# Patient Record
Sex: Female | Born: 1955
Health system: Southern US, Community
[De-identification: ages and names within clinical notes are randomized; demographics above are authoritative.]

## PROBLEM LIST (undated history)

## (undated) DIAGNOSIS — K219 Gastro-esophageal reflux disease without esophagitis: Secondary | ICD-10-CM

## (undated) DIAGNOSIS — F1010 Alcohol abuse, uncomplicated: Secondary | ICD-10-CM

## (undated) DIAGNOSIS — N289 Disorder of kidney and ureter, unspecified: Secondary | ICD-10-CM

## (undated) DIAGNOSIS — E119 Type 2 diabetes mellitus without complications: Secondary | ICD-10-CM

## (undated) DIAGNOSIS — F1096 Alcohol use, unspecified with alcohol-induced persisting amnestic disorder: Secondary | ICD-10-CM

## (undated) DIAGNOSIS — I10 Essential (primary) hypertension: Secondary | ICD-10-CM

## (undated) HISTORY — PX: FINGER SURGERY: SHX640

## (undated) HISTORY — PX: EYE SURGERY: SHX253

## (undated) HISTORY — PX: PILONIDAL CYST DRAINAGE: SHX743

## (undated) HISTORY — PX: DILATION AND CURETTAGE OF UTERUS: SHX78

## (undated) HISTORY — DX: Alcohol abuse, uncomplicated: F10.10

---

## 1987-04-22 HISTORY — PX: KNEE ARTHROSCOPY: SUR90

## 2011-02-12 ENCOUNTER — Ambulatory Visit: Payer: Self-pay | Admitting: Ophthalmology

## 2011-06-10 ENCOUNTER — Ambulatory Visit: Payer: Self-pay | Admitting: Ophthalmology

## 2011-07-01 ENCOUNTER — Ambulatory Visit: Payer: Self-pay | Admitting: Ophthalmology

## 2013-08-17 DIAGNOSIS — R748 Abnormal levels of other serum enzymes: Secondary | ICD-10-CM | POA: Insufficient documentation

## 2013-08-17 DIAGNOSIS — K760 Fatty (change of) liver, not elsewhere classified: Secondary | ICD-10-CM | POA: Insufficient documentation

## 2014-08-13 NOTE — Op Note (Signed)
PATIENT NAME:  Laurie Lin, Laurie Lin MR#:  [id number] DATE OF BIRTH:  June 30, 1955  DATE OF PROCEDURE:  06/10/2011  PREOPERATIVE DIAGNOSIS: Visually significant cataract of the right eye.   POSTOPERATIVE DIAGNOSIS: Visually significant cataract of the right eye.   OPERATIVE PROCEDURE: Cataract extraction by phacoemulsification with implant of intraocular lens to right eye.   SURGEON: Birder Robson, MD.   ANESTHESIA:  1. Managed anesthesia care.  2. Topical tetracaine drops followed by 2% Xylocaine jelly applied in the preoperative holding area.   COMPLICATIONS: None.   TECHNIQUE:  Stop and chop.   DESCRIPTION OF PROCEDURE: The patient was examined and consented in the preoperative holding area where the aforementioned topical anesthesia was applied to the right eye.  The patient was brought back to the Operating Room where he was sat upright on the gurney and given a target to fixate upon while the eye was marked at the 3:00 and 9:00 position.  The patient was then reclined on the operating table, and lidocaine jelly was applied to the right eye.  The eye was prepped and draped in the usual sterile ophthalmic fashion and a lid speculum was placed. A paracentesis was created with the side port blade and the anterior chamber was filled with viscoelastic. A near clear corneal incision was performed with the steel keratome. A continuous curvilinear capsulorrhexis was performed with a cystotome followed by the capsulorrhexis forceps. Hydrodissection and hydrodelineation were carried out with BSS on a blunt cannula. The lens was removed in a stop and chop technique and the remaining cortical material was removed with the irrigation-aspiration handpiece. The eye was inflated with viscoelastic and the Alcon [id number] 14.5-diopter lens, serial number [id number]  was placed in the eye and rotated to within a few degrees of the predetermined orientation at 155 degrees.  The remaining viscoelastic was removed from  the eye.  The Sinskey hook was used to rotate the toric lens into its final resting place at 96 degrees.  The eye was inflated to a physiologic pressure and found to be watertight.  The eye was dressed with Vigamox and Omnipred. The patient was given protective glasses to wear throughout the day and a shield with which to sleep tonight. The patient was also given drops with which to begin a drop regimen today and will follow-up with me in one day.  ____________________________ Livingston Diones. Milledge Gerding, MD wlp:slb D: 06/10/2011 12:21:54 ET T: 06/10/2011 12:58:01 ET JOB#: [id number]  cc: Birgit Nowling L. Antony Sian, MD, <Dictator> Livingston Diones Noeh Sparacino MD ELECTRONICALLY SIGNED 06/13/2011 15:32

## 2014-08-13 NOTE — Op Note (Signed)
PATIENT NAME:  Laurie Lin, Laurie Lin MR#:  [id number] DATE OF BIRTH:  07-22-1955  DATE OF PROCEDURE:  07/01/2011  PREOPERATIVE DIAGNOSIS: Visually significant cataract of the left eye.   POSTOPERATIVE DIAGNOSIS: Visually significant cataract of the left eye.   OPERATIVE PROCEDURE: Cataract extraction by phacoemulsification with implant of intraocular lens to left eye.   SURGEON: Birder Robson, MD.   ANESTHESIA:  1. Managed anesthesia care.  2. Topical tetracaine drops followed by 2% Xylocaine jelly applied in the preoperative holding area.   COMPLICATIONS: None.   TECHNIQUE:  Stop and chop.     DESCRIPTION OF PROCEDURE: The patient was examined and consented in the preoperative holding area where the aforementioned topical anesthesia was applied to the left eye.  The patient was brought back to the Operating Room where he was sat upright on the gurney and given a target to fixate upon while the eye was marked at the 3:00 and 9:00 position.  The patient was then reclined on the operating table, and lidocaine jelly was applied to the left eye.  The eye was prepped and draped in the usual sterile ophthalmic fashion and a lid speculum was placed. A paracentesis was created with the side port blade and the anterior chamber was filled with viscoelastic. A near clear corneal incision was performed with the steel keratome. A continuous curvilinear capsulorrhexis was performed with a cystotome followed by the capsulorrhexis forceps. Hydrodissection and hydrodelineation were carried out with BSS on a blunt cannula. The lens was removed in a stop and chop technique and the remaining cortical material was removed with the irrigation-aspiration handpiece. The eye was inflated with viscoelastic and the Alcon SN6AT4 15.5-diopter lens, serial number [id number]  was placed in the eye and rotated to within a few degrees of the predetermined orientation at 22 degrees.  The remaining viscoelastic was removed from the  eye.  The Sinskey hook was used to rotate the toric lens into its final resting place at 95 degrees.  The eye was inflated to a physiologic pressure and found to be watertight.  The eye was dressed with Vigamox. The patient was given protective glasses to wear throughout the day and a shield with which to sleep tonight. The patient was also given drops with which to begin a drop regimen today and will follow-up with me in one day.   ____________________________ Livingston Diones. Vara Mairena, MD wlp:cms D: 07/01/2011 13:12:23 ET T: 07/01/2011 13:33:15 ET JOB#: [id number]  cc: Leeba Barbe L. Mikaya Bunner, MD, <Dictator> Livingston Diones Nazareth Kirk MD ELECTRONICALLY SIGNED 07/02/2011 13:42

## 2014-09-28 DIAGNOSIS — I10 Essential (primary) hypertension: Secondary | ICD-10-CM | POA: Insufficient documentation

## 2015-01-03 DIAGNOSIS — F329 Major depressive disorder, single episode, unspecified: Secondary | ICD-10-CM | POA: Insufficient documentation

## 2017-03-21 ENCOUNTER — Emergency Department: Payer: BLUE CROSS/BLUE SHIELD

## 2017-03-21 ENCOUNTER — Inpatient Hospital Stay
Admission: EM | Admit: 2017-03-21 | Discharge: 2017-03-27 | DRG: 638 | Disposition: A | Discharge status: Skilled Nursing Facility | Payer: BLUE CROSS/BLUE SHIELD | Attending: Specialist | Admitting: Specialist

## 2017-03-21 ENCOUNTER — Other Ambulatory Visit: Payer: Self-pay

## 2017-03-21 DIAGNOSIS — N19 Unspecified kidney failure: Secondary | ICD-10-CM

## 2017-03-21 DIAGNOSIS — N184 Chronic kidney disease, stage 4 (severe): Secondary | ICD-10-CM | POA: Diagnosis present

## 2017-03-21 DIAGNOSIS — D631 Anemia in chronic kidney disease: Secondary | ICD-10-CM | POA: Diagnosis present

## 2017-03-21 DIAGNOSIS — E1022 Type 1 diabetes mellitus with diabetic chronic kidney disease: Secondary | ICD-10-CM | POA: Diagnosis present

## 2017-03-21 DIAGNOSIS — Z794 Long term (current) use of insulin: Secondary | ICD-10-CM | POA: Diagnosis not present

## 2017-03-21 DIAGNOSIS — E872 Acidosis, unspecified: Secondary | ICD-10-CM

## 2017-03-21 DIAGNOSIS — K219 Gastro-esophageal reflux disease without esophagitis: Secondary | ICD-10-CM | POA: Diagnosis present

## 2017-03-21 DIAGNOSIS — K760 Fatty (change of) liver, not elsewhere classified: Secondary | ICD-10-CM | POA: Diagnosis present

## 2017-03-21 DIAGNOSIS — I951 Orthostatic hypotension: Secondary | ICD-10-CM | POA: Diagnosis present

## 2017-03-21 DIAGNOSIS — G9349 Other encephalopathy: Secondary | ICD-10-CM | POA: Diagnosis present

## 2017-03-21 DIAGNOSIS — E1010 Type 1 diabetes mellitus with ketoacidosis without coma: Secondary | ICD-10-CM | POA: Diagnosis present

## 2017-03-21 DIAGNOSIS — Z79899 Other long term (current) drug therapy: Secondary | ICD-10-CM | POA: Diagnosis not present

## 2017-03-21 DIAGNOSIS — E1110 Type 2 diabetes mellitus with ketoacidosis without coma: Secondary | ICD-10-CM | POA: Diagnosis present

## 2017-03-21 DIAGNOSIS — E1040 Type 1 diabetes mellitus with diabetic neuropathy, unspecified: Secondary | ICD-10-CM | POA: Diagnosis present

## 2017-03-21 DIAGNOSIS — F329 Major depressive disorder, single episode, unspecified: Secondary | ICD-10-CM | POA: Diagnosis present

## 2017-03-21 DIAGNOSIS — E10649 Type 1 diabetes mellitus with hypoglycemia without coma: Secondary | ICD-10-CM | POA: Diagnosis present

## 2017-03-21 DIAGNOSIS — R4182 Altered mental status, unspecified: Secondary | ICD-10-CM | POA: Diagnosis not present

## 2017-03-21 DIAGNOSIS — I129 Hypertensive chronic kidney disease with stage 1 through stage 4 chronic kidney disease, or unspecified chronic kidney disease: Secondary | ICD-10-CM | POA: Diagnosis present

## 2017-03-21 DIAGNOSIS — E10319 Type 1 diabetes mellitus with unspecified diabetic retinopathy without macular edema: Secondary | ICD-10-CM | POA: Diagnosis present

## 2017-03-21 DIAGNOSIS — F10231 Alcohol dependence with withdrawal delirium: Secondary | ICD-10-CM | POA: Diagnosis present

## 2017-03-21 DIAGNOSIS — Z833 Family history of diabetes mellitus: Secondary | ICD-10-CM

## 2017-03-21 DIAGNOSIS — G9341 Metabolic encephalopathy: Secondary | ICD-10-CM | POA: Diagnosis not present

## 2017-03-21 DIAGNOSIS — E860 Dehydration: Secondary | ICD-10-CM | POA: Diagnosis present

## 2017-03-21 DIAGNOSIS — R680 Hypothermia, not associated with low environmental temperature: Secondary | ICD-10-CM | POA: Diagnosis present

## 2017-03-21 DIAGNOSIS — E1021 Type 1 diabetes mellitus with diabetic nephropathy: Secondary | ICD-10-CM | POA: Diagnosis present

## 2017-03-21 DIAGNOSIS — T383X6A Underdosing of insulin and oral hypoglycemic [antidiabetic] drugs, initial encounter: Secondary | ICD-10-CM | POA: Diagnosis present

## 2017-03-21 DIAGNOSIS — T68XXXA Hypothermia, initial encounter: Secondary | ICD-10-CM

## 2017-03-21 DIAGNOSIS — K7460 Unspecified cirrhosis of liver: Secondary | ICD-10-CM

## 2017-03-21 DIAGNOSIS — N179 Acute kidney failure, unspecified: Secondary | ICD-10-CM | POA: Diagnosis present

## 2017-03-21 HISTORY — DX: Disorder of kidney and ureter, unspecified: N28.9

## 2017-03-21 HISTORY — DX: Type 2 diabetes mellitus without complications: E11.9

## 2017-03-21 HISTORY — DX: Gastro-esophageal reflux disease without esophagitis: K21.9

## 2017-03-21 HISTORY — DX: Essential (primary) hypertension: I10

## 2017-03-21 LAB — URINALYSIS, ROUTINE W REFLEX MICROSCOPIC
Bilirubin Urine: NEGATIVE
Glucose, UA: >=500 mg/dL — AB
KETONES UR: 20 mg/dL — AB
NITRITE: NEGATIVE
PH: 5 (ref 5.0–8.0)
PROTEIN: 100 mg/dL — AB
Specific Gravity, Urine: 1.014 (ref 1.005–1.030)

## 2017-03-21 LAB — GLUCOSE, CAPILLARY
GLUCOSE-CAPILLARY: 555 mg/dL — AB (ref 65–99)
GLUCOSE-CAPILLARY: 475 mg/dL — AB (ref 65–99)
GLUCOSE-CAPILLARY: 294 mg/dL — AB (ref 65–99)
Glucose-Capillary: 190 mg/dL — ABNORMAL HIGH (ref 65–99)
Glucose-Capillary: 226 mg/dL — ABNORMAL HIGH (ref 65–99)
Glucose-Capillary: 254 mg/dL — ABNORMAL HIGH (ref 65–99)
Glucose-Capillary: 336 mg/dL — ABNORMAL HIGH (ref 65–99)
Glucose-Capillary: 379 mg/dL — ABNORMAL HIGH (ref 65–99)
Glucose-Capillary: 402 mg/dL — ABNORMAL HIGH (ref 65–99)
Glucose-Capillary: 556 mg/dL (ref 65–99)
Glucose-Capillary: 576 mg/dL (ref 65–99)

## 2017-03-21 LAB — COMPREHENSIVE METABOLIC PANEL
ALBUMIN: 3.1 g/dL — AB (ref 3.5–5.0)
ALK PHOS: 55 U/L (ref 38–126)
ALT: 8 U/L — AB (ref 14–54)
AST: 13 U/L — ABNORMAL LOW (ref 15–41)
BILIRUBIN TOTAL: 2.2 mg/dL — AB (ref 0.3–1.2)
BUN: 27 mg/dL — AB (ref 6–20)
CALCIUM: 9.1 mg/dL (ref 8.9–10.3)
CREATININE: 2.58 mg/dL — AB (ref 0.44–1.00)
Chloride: 93 mmol/L — ABNORMAL LOW (ref 101–111)
GFR calc Af Amer: 22 mL/min — ABNORMAL LOW (ref >60)
GFR calc non Af Amer: 19 mL/min — ABNORMAL LOW (ref >60)
GLUCOSE: 620 mg/dL — AB (ref 65–99)
Potassium: 5.3 mmol/L — ABNORMAL HIGH (ref 3.5–5.1)
SODIUM: 132 mmol/L — AB (ref 135–145)
TOTAL PROTEIN: 7.4 g/dL (ref 6.5–8.1)

## 2017-03-21 LAB — PROCALCITONIN: PROCALCITONIN: 0.19 ng/mL

## 2017-03-21 LAB — BASIC METABOLIC PANEL
Anion gap: 25 — ABNORMAL HIGH (ref 5–15)
BUN: 29 mg/dL — AB (ref 6–20)
BUN: 30 mg/dL — AB (ref 6–20)
CALCIUM: 8.5 mg/dL — AB (ref 8.9–10.3)
CHLORIDE: 95 mmol/L — AB (ref 101–111)
CHLORIDE: 101 mmol/L (ref 101–111)
CO2: <7 mmol/L — ABNORMAL LOW (ref 22–32)
CO2: 12 mmol/L — ABNORMAL LOW (ref 22–32)
CREATININE: 2.71 mg/dL — AB (ref 0.44–1.00)
CREATININE: 2.83 mg/dL — AB (ref 0.44–1.00)
Calcium: 8.2 mg/dL — ABNORMAL LOW (ref 8.9–10.3)
GFR, EST AFRICAN AMERICAN: 21 mL/min — AB (ref >60)
GFR, EST AFRICAN AMERICAN: 19 mL/min — AB (ref >60)
GFR, EST NON AFRICAN AMERICAN: 18 mL/min — AB (ref >60)
GFR, EST NON AFRICAN AMERICAN: 17 mL/min — AB (ref >60)
Glucose, Bld: 570 mg/dL (ref 65–99)
Glucose, Bld: 333 mg/dL — ABNORMAL HIGH (ref 65–99)
Potassium: 4.1 mmol/L (ref 3.5–5.1)
Potassium: 2.9 mmol/L — ABNORMAL LOW (ref 3.5–5.1)
SODIUM: 132 mmol/L — AB (ref 135–145)
SODIUM: 138 mmol/L (ref 135–145)

## 2017-03-21 LAB — CBC WITH DIFFERENTIAL/PLATELET
BASOS ABS: 0.1 10*3/uL (ref 0–0.1)
BASOS PCT: 1 %
EOS ABS: 0 10*3/uL (ref 0–0.7)
EOS PCT: 0 %
HEMATOCRIT: 33.3 % — AB (ref 35.0–47.0)
Hemoglobin: 10.1 g/dL — ABNORMAL LOW (ref 12.0–16.0)
Lymphocytes Relative: 5 %
Lymphs Abs: 0.5 10*3/uL — ABNORMAL LOW (ref 1.0–3.6)
MCH: 33 pg (ref 26.0–34.0)
MCHC: 30.3 g/dL — AB (ref 32.0–36.0)
MCV: 109.1 fL — ABNORMAL HIGH (ref 80.0–100.0)
MONO ABS: 0.9 10*3/uL (ref 0.2–0.9)
MONOS PCT: 8 %
NEUTROS ABS: 10 10*3/uL — AB (ref 1.4–6.5)
Neutrophils Relative %: 86 %
PLATELETS: 284 10*3/uL (ref 150–440)
RBC: 3.05 MIL/uL — ABNORMAL LOW (ref 3.80–5.20)
RDW: 15.4 % — AB (ref 11.5–14.5)
WBC: 11.5 10*3/uL — ABNORMAL HIGH (ref 3.6–11.0)

## 2017-03-21 LAB — BLOOD GAS, VENOUS
Acid-base deficit: 27.3 mmol/L — ABNORMAL HIGH (ref 0.0–2.0)
Acid-base deficit: 9.3 mmol/L — ABNORMAL HIGH (ref 0.0–2.0)
BICARBONATE: 3.8 mmol/L — AB (ref 20.0–28.0)
Bicarbonate: 13.9 mmol/L — ABNORMAL LOW (ref 20.0–28.0)
FIO2: 0.21
O2 SAT: 63.1 %
O2 Saturation: 78 %
PATIENT TEMPERATURE: 37
PCO2 VEN: 19 mmHg — AB (ref 44.0–60.0)
PH VEN: 7.41 (ref 7.250–7.430)
PO2 VEN: 57 mmHg — AB (ref 32.0–45.0)
Patient temperature: 37
pCO2, Ven: 22 mmHg — ABNORMAL LOW (ref 44.0–60.0)
pH, Ven: 6.91 — CL (ref 7.250–7.430)
pO2, Ven: 42 mmHg (ref 32.0–45.0)

## 2017-03-21 LAB — CBC
HCT: 28.4 % — ABNORMAL LOW (ref 35.0–47.0)
Hemoglobin: 9 g/dL — ABNORMAL LOW (ref 12.0–16.0)
MCH: 33 pg (ref 26.0–34.0)
MCHC: 31.6 g/dL — ABNORMAL LOW (ref 32.0–36.0)
MCV: 104.2 fL — AB (ref 80.0–100.0)
PLATELETS: 240 10*3/uL (ref 150–440)
RBC: 2.72 MIL/uL — AB (ref 3.80–5.20)
RDW: 15.3 % — AB (ref 11.5–14.5)
WBC: 8.4 10*3/uL (ref 3.6–11.0)

## 2017-03-21 LAB — AMMONIA: AMMONIA: 28 umol/L (ref 9–35)

## 2017-03-21 LAB — TROPONIN I: Troponin I: <0.03 ng/mL (ref <0.03)

## 2017-03-21 LAB — MRSA PCR SCREENING: MRSA BY PCR: NEGATIVE

## 2017-03-21 LAB — LACTIC ACID, PLASMA
LACTIC ACID, VENOUS: 2.3 mmol/L — AB (ref 0.5–1.9)
LACTIC ACID, VENOUS: 1.6 mmol/L (ref 0.5–1.9)

## 2017-03-21 LAB — ETHANOL: Alcohol, Ethyl (B): <10 mg/dL (ref <10)

## 2017-03-21 LAB — MAGNESIUM: Magnesium: 1.1 mg/dL — ABNORMAL LOW (ref 1.7–2.4)

## 2017-03-21 LAB — PHOSPHORUS: Phosphorus: <1 mg/dL — CL (ref 2.5–4.6)

## 2017-03-21 LAB — BETA-HYDROXYBUTYRIC ACID

## 2017-03-21 MED ORDER — PANTOPRAZOLE SODIUM 40 MG IV SOLR
40.0000 mg | Freq: Two times a day (BID) | INTRAVENOUS | Status: DC
  Administered 2017-03-21 – 2017-03-24 (×8): 40 mg via INTRAVENOUS
  Filled 2017-03-21 (×8): qty 40

## 2017-03-21 MED ORDER — VANCOMYCIN HCL IN DEXTROSE 1-5 GM/200ML-% IV SOLN
1000.0000 mg | Freq: Once | INTRAVENOUS | Status: AC
  Administered 2017-03-21: 1000 mg via INTRAVENOUS
  Filled 2017-03-21: qty 200

## 2017-03-21 MED ORDER — POTASSIUM CHLORIDE 20 MEQ PO PACK
40.0000 meq | PACK | Freq: Once | ORAL | Status: AC
  Administered 2017-03-21: 40 meq via ORAL
  Filled 2017-03-21: qty 2

## 2017-03-21 MED ORDER — POTASSIUM CHLORIDE 20 MEQ PO PACK: 60.0000 meq | PACK | Freq: Once | ORAL | Status: DC

## 2017-03-21 MED ORDER — SODIUM CHLORIDE 0.9 % IV SOLN
INTRAVENOUS | Status: DC
  Administered 2017-03-21 – 2017-03-22 (×2): via INTRAVENOUS
  Administered 2017-03-22: 125 mL/h via INTRAVENOUS
  Administered 2017-03-23 – 2017-03-25 (×4): via INTRAVENOUS

## 2017-03-21 MED ORDER — POTASSIUM PHOSPHATES 15 MMOLE/5ML IV SOLN: 40.0000 mmol | Freq: Four times a day (QID) | INTRAVENOUS | Status: DC

## 2017-03-21 MED ORDER — ONDANSETRON HCL 4 MG/2ML IJ SOLN
4.0000 mg | Freq: Four times a day (QID) | INTRAMUSCULAR | Status: DC | PRN
  Administered 2017-03-21 – 2017-03-24 (×3): 4 mg via INTRAVENOUS
  Filled 2017-03-21 (×3): qty 2

## 2017-03-21 MED ORDER — VANCOMYCIN HCL 500 MG IV SOLR
500.0000 mg | Freq: Once | INTRAVENOUS | Status: DC
  Filled 2017-03-21: qty 500

## 2017-03-21 MED ORDER — SODIUM BICARBONATE 8.4 % IV SOLN
100.0000 meq | Freq: Once | INTRAVENOUS | Status: AC
  Administered 2017-03-21: 100 meq via INTRAVENOUS
  Filled 2017-03-21: qty 50

## 2017-03-21 MED ORDER — MAGNESIUM SULFATE 4 GM/100ML IV SOLN
4.0000 g | Freq: Once | INTRAVENOUS | Status: AC
  Administered 2017-03-21: 4 g via INTRAVENOUS
  Filled 2017-03-21: qty 100

## 2017-03-21 MED ORDER — LORAZEPAM 1 MG PO TABS
1.0000 mg | ORAL_TABLET | Freq: Four times a day (QID) | ORAL | Status: AC | PRN
Start: 2017-03-21 — End: 2017-03-24
  Administered 2017-03-24: 09:00:00 1 mg via ORAL
  Filled 2017-03-21: qty 1

## 2017-03-21 MED ORDER — PIPERACILLIN-TAZOBACTAM 3.375 G IVPB: 3.3750 g | Freq: Three times a day (TID) | INTRAVENOUS | Status: DC

## 2017-03-21 MED ORDER — DEXTROSE-NACL 5-0.45 % IV SOLN
INTRAVENOUS | Status: DC
  Administered 2017-03-21: 100 mL/h via INTRAVENOUS

## 2017-03-21 MED ORDER — VITAMIN B-1 100 MG PO TABS
100.0000 mg | ORAL_TABLET | Freq: Every day | ORAL | Status: DC
  Administered 2017-03-21 – 2017-03-27 (×6): 100 mg via ORAL
  Filled 2017-03-21 (×7): qty 1

## 2017-03-21 MED ORDER — ADULT MULTIVITAMIN W/MINERALS CH
1.0000 | ORAL_TABLET | Freq: Every day | ORAL | Status: DC
  Administered 2017-03-21 – 2017-03-27 (×6): 1 via ORAL
  Filled 2017-03-21 (×7): qty 1

## 2017-03-21 MED ORDER — POTASSIUM PHOSPHATES 15 MMOLE/5ML IV SOLN
40.0000 mmol | Freq: Four times a day (QID) | INTRAVENOUS | Status: AC
  Administered 2017-03-21 – 2017-03-22 (×2): 40 mmol via INTRAVENOUS
  Filled 2017-03-21 (×2): qty 13.33

## 2017-03-21 MED ORDER — POTASSIUM CHLORIDE 10 MEQ/100ML IV SOLN
10.0000 meq | INTRAVENOUS | Status: AC
  Administered 2017-03-21 (×3): 10 meq via INTRAVENOUS
  Filled 2017-03-21 (×3): qty 100

## 2017-03-21 MED ORDER — VANCOMYCIN HCL IN DEXTROSE 1-5 GM/200ML-% IV SOLN: 1000.0000 mg | INTRAVENOUS | Status: DC

## 2017-03-21 MED ORDER — STERILE WATER FOR INJECTION IV SOLN
Freq: Once | INTRAVENOUS | Status: AC
  Administered 2017-03-21: 13:00:00 via INTRAVENOUS
  Filled 2017-03-21: qty 850

## 2017-03-21 MED ORDER — SODIUM CHLORIDE 0.9 % IV BOLUS (SEPSIS)
1000.0000 mL | Freq: Once | INTRAVENOUS | Status: AC
  Administered 2017-03-21: 1000 mL via INTRAVENOUS

## 2017-03-21 MED ORDER — THIAMINE HCL 100 MG/ML IJ SOLN
100.0000 mg | Freq: Every day | INTRAMUSCULAR | Status: DC
  Filled 2017-03-21 (×2): qty 1
  Filled 2017-03-21: qty 2
  Filled 2017-03-21 (×2): qty 1

## 2017-03-21 MED ORDER — LORAZEPAM 2 MG/ML IJ SOLN
1.0000 mg | Freq: Four times a day (QID) | INTRAMUSCULAR | Status: AC | PRN
  Administered 2017-03-22 (×2): 1 mg via INTRAVENOUS
  Filled 2017-03-21 (×2): qty 1

## 2017-03-21 MED ORDER — SODIUM CHLORIDE 0.9 % IV SOLN
INTRAVENOUS | Status: DC
  Administered 2017-03-21: 16.6 [IU]/h via INTRAVENOUS
  Administered 2017-03-22: 13.2 [IU]/h via INTRAVENOUS
  Filled 2017-03-21 (×5): qty 1

## 2017-03-21 MED ORDER — FOLIC ACID 1 MG PO TABS
1.0000 mg | ORAL_TABLET | Freq: Every day | ORAL | Status: DC
  Administered 2017-03-21 – 2017-03-27 (×6): 1 mg via ORAL
  Filled 2017-03-21 (×7): qty 1

## 2017-03-21 MED ORDER — PIPERACILLIN-TAZOBACTAM 3.375 G IVPB 30 MIN
3.3750 g | Freq: Once | INTRAVENOUS | Status: AC
  Administered 2017-03-21: 3.375 g via INTRAVENOUS
  Filled 2017-03-21: qty 50

## 2017-03-21 MED ORDER — SODIUM CHLORIDE 0.9 % IV SOLN
INTRAVENOUS | Status: DC
  Administered 2017-03-21: 5.2 [IU]/h via INTRAVENOUS
  Filled 2017-03-21: qty 1

## 2017-03-21 MED ORDER — PANTOPRAZOLE SODIUM 40 MG PO TBEC: 40.0000 mg | DELAYED_RELEASE_TABLET | Freq: Every day | ORAL | Status: DC

## 2017-03-21 MED ORDER — SODIUM CHLORIDE 0.9 % IV SOLN: INTRAVENOUS | Status: AC

## 2017-03-21 MED ORDER — ENOXAPARIN SODIUM 30 MG/0.3ML ~~LOC~~ SOLN
30.0000 mg | SUBCUTANEOUS | Status: DC
  Administered 2017-03-21: 30 mg via SUBCUTANEOUS
  Filled 2017-03-21: qty 0.3

## 2017-03-21 NOTE — ED Notes (Signed)
Gave reprot to [facility] RN

## 2017-03-21 NOTE — ED Provider Notes (Signed)
[facility] Emergency Department Provider Note   ____________________________________________   None    (approximate)  I have reviewed the triage vital signs and the nursing notes.   HISTORY  Chief Complaint Confusion high blood sugars  EM caveat: The patient is confused, unable to provide notable history as she does not recall   HPI Laurie Lin is a 61 y.o. female here for evaluation of confusion  EMS reports that the patient went to bed last evening, this morning family tried to wake her up and found that she seemed to be confused.  They are concerned because she does not use her insulin or check her blood sugars at home, evidently also she has a history of alcohol abuse recently.  EMS noted the patient felt cold, slightly hypotensive and blood sugar elevated to greater than 400.  500 mL normal saline bolus initiated prior to arrival.  I do report the patient is able to recall the year but does not seem well oriented and cannot recall events.  I do reported that she was in a warm room, not in a cold environment.  Also reports that she has had complaints of nausea and vomiting  The patient has no complaints at this time, but she does appear confused.  She is able to recall that she has been throwing up.  She does report that her mouth feels very dry and she is very thirsty     Past Medical History:  Diagnosis Date  . Diabetes mellitus without complication ([place])   . Hypertension   . Renal disorder    stage 3     There are no active problems to display for this patient.   Past Surgical History:  Procedure Laterality Date  . EYE SURGERY     bilateral caterac    Prior to Admission medications   Medication Sig Start Date End Date Taking? Authorizing Provider  esomeprazole (NEXIUM) 40 MG capsule Take 40 mg by mouth 2 (two) times daily. 12/25/16  Yes [provider]  furosemide (LASIX) 20 MG tablet Take 20 mg by mouth daily. 02/10/17   Yes [provider]  metoprolol tartrate (LOPRESSOR) 25 MG tablet Take 25 mg by mouth 2 (two) times daily. 12/30/16  Yes [provider]    Allergies Patient has no known allergies.  History reviewed. No pertinent family history.  Social History Social History   Tobacco Use  . Smoking status: Never Smoker  . Smokeless tobacco: Never Used  Substance Use Topics  . Alcohol use: Yes    Comment: heavy drinker  . Drug use: No    Review of Systems EM caveat   ____________________________________________   PHYSICAL EXAM:  VITAL SIGNS: ED Triage Vitals  Enc Vitals Group     BP      Pulse      Resp      Temp      Temp src      SpO2      Weight      Height      Head Circumference      Peak Flow      Pain Score      Pain Loc      Pain Edu?      Excl. in [place]?     Constitutional: Alert and oriented to self but not situation.  She is sitting up pleasantly and in no obvious distress. Eyes: Conjunctivae are normal. Head: Atraumatic. Nose: No congestion/rhinnorhea. Mouth/Throat: Mucous membranes  are extremely dry Neck: No stridor.   Cardiovascular: Normal rate, regular rhythm. Grossly normal heart sounds.  Good peripheral circulation. Respiratory: Normal respiratory effort.  No retractions. Lungs CTAB. Gastrointestinal: Soft and nontender. No distention. Musculoskeletal: No lower extremity tenderness nor edema. Neurologic:  Normal speech and language. No gross focal neurologic deficits are appreciated.  Skin:  Skin is cool, dry and intact. No rash noted. Psychiatric: Mood and affect are flat.  Seems slightly confused.  Cannot seem to recall events on why she presented today.  Does recall that she is diabetic but cannot tell me if she is using her insulin though EMS reported she was not. ____________________________________________   LABS (all labs ordered are listed, but only abnormal results are displayed)  Labs Reviewed  COMPREHENSIVE METABOLIC  PANEL - Abnormal; Notable for the following components:      Result Value   Sodium 132 (*)    Potassium 5.3 (*)    Chloride 93 (*)    CO2 <7 (*)    Glucose, Bld 620 (*)    BUN 27 (*)    Creatinine, Ser 2.58 (*)    Albumin 3.1 (*)    AST 13 (*)    ALT 8 (*)    Total Bilirubin 2.2 (*)    GFR calc non Af Amer 19 (*)    GFR calc Af Amer 22 (*)    All other components within normal limits  CBC WITH DIFFERENTIAL/PLATELET - Abnormal; Notable for the following components:   WBC 11.5 (*)    RBC 3.05 (*)    Hemoglobin 10.1 (*)    HCT 33.3 (*)    MCV 109.1 (*)    MCHC 30.3 (*)    RDW 15.4 (*)    Neutro Abs 10.0 (*)    Lymphs Abs 0.5 (*)    All other components within normal limits  URINALYSIS, ROUTINE W REFLEX MICROSCOPIC - Abnormal; Notable for the following components:   Color, Urine YELLOW (*)    APPearance HAZY (*)    Glucose, UA >=500 (*)    Hgb urine dipstick MODERATE (*)    Ketones, ur 20 (*)    Protein, ur 100 (*)    Leukocytes, UA SMALL (*)    Bacteria, UA RARE (*)    Squamous Epithelial / LPF 0-5 (*)    All other components within normal limits  BLOOD GAS, VENOUS - Abnormal; Notable for the following components:   pH, Ven 6.91 (*)    pCO2, Ven 19 (*)    pO2, Ven 57.0 (*)    Bicarbonate 3.8 (*)    Acid-base deficit 27.3 (*)    All other components within normal limits  LACTIC ACID, PLASMA - Abnormal; Notable for the following components:   Lactic Acid, Venous 2.3 (*)    All other components within normal limits  CULTURE, BLOOD (ROUTINE X 2)  CULTURE, BLOOD (ROUTINE X 2)  TROPONIN I  LACTIC ACID, PLASMA  BETA-HYDROXYBUTYRIC ACID  AMMONIA  ETHANOL   ____________________________________________  EKG  Reviewed interrupt me at 11 AM Heart rate 99 QRS 90 QTC 460 Normal sinus rhythm, occasional PVC, nonspecific T wave abnormality possibly artifactual seen in multiple leads.  No clear evidence of  ischemia ____________________________________________  RADIOLOGY  Dg Chest Port 1 View  Result Date: 03/21/2017 CLINICAL DATA:  Confusion, known diabetic, ? Sepsis Pt unresponsive to questions EXAM: PORTABLE CHEST 1 VIEW COMPARISON:  None. FINDINGS: Normal mediastinum and cardiac silhouette. Normal pulmonary vasculature. No evidence of effusion, infiltrate,  or pneumothorax. No acute bony abnormality. IMPRESSION: Normal chest radiograph Electronically Signed   By: Suzy Bouchard M.D.   On: 03/21/2017 11:01    Chest x-ray reviewed, no acute ____________________________________________   PROCEDURES  Procedure(s) performed: None  Procedures  Critical Care performed: Yes, see critical care note(s)  CRITICAL CARE Performed by: Delman Kitten   Total critical care time: 65 minutes  Critical care time was exclusive of separately billable procedures and treating other patients.  Critical care was necessary to treat or prevent imminent or life-threatening deterioration.  Critical care was time spent personally by me on the following activities: development of treatment plan with patient and/or surrogate as well as nursing, discussions with consultants, evaluation of patient's response to treatment, examination of patient, obtaining history from patient or surrogate, ordering and performing treatments and interventions, ordering and review of laboratory studies, ordering and review of radiographic studies, pulse oximetry and re-evaluation of patient's condition.  ____________________________________________   INITIAL IMPRESSION / ASSESSMENT AND PLAN / ED COURSE  Pertinent labs & imaging results that were available during my care of the patient were reviewed by me and considered in my medical decision making (see chart for details).    Clinical Course as of Mar 21 1220  Sat Mar 21, 2017  1141 Discussed with patient's husband is now bedside.  Relayed her critical status.  She is now  sitting upright and her blood pressures improved to over 564 systolic.  She does appear improved.  Husband relates that she has been very thirsty for about 3-4 days drinking lots of water, began developing nausea and confusion yesterday but was able to go shopping and get a Christmas tree.  This morning her status continued to worsen.  At the present time I suspect likely this represents significant DKA, but cannot exclude infection especially given her associated hypothermia and elevated lactate which could be elevated from her DKA as well.  Hydrating generously, awaiting chemistry.  She is on the bear hugger.  Will initiate broad-spectrum antibiotic at this time as I am unclear if this could also potentially represent sepsis/severe sepsis.  [MQ]  3329 Still awaiting met panel. Continuing fluid boluses.   [MQ]    Clinical Course User Index [MQ] Delman Kitten, MD    ----------------------------------------- 12:19 PM on 03/21/2017 -----------------------------------------  Presentation appears consistent with DKA.  Patient is now resulted with labs with potassium that is greater than 5 and acute kidney injury.  I will start her on bicarbonate infusion as well as ED glucose stabilizer/insulin at this time now that she has received 2 L of fluid.  Her blood pressure is improved, she is rewarming, she is alert and oriented to self but still remains slightly confused but overall appears much improved.  At this time, appears consistent with DKA with some alteration in mental status associated.  I have discussed with the hospitalist as well as family.  We will admit her for ongoing care.  Patient critical. ____________________________________________   FINAL CLINICAL IMPRESSION(S) / ED DIAGNOSES  Final diagnoses:  Type 1 diabetes mellitus with ketoacidosis without coma (HCC)  Hypothermia, initial encounter  AKI (acute kidney injury) ([place])  Metabolic acidosis      NEW MEDICATIONS STARTED DURING THIS  VISIT:  This SmartLink is deprecated. Use AVSMEDLIST instead to display the medication list for a patient.   Note:  This document was prepared using Dragon voice recognition software and may include unintentional dictation errors.     Delman Kitten, MD 03/21/17 [phone]

## 2017-03-21 NOTE — Consult Note (Addendum)
[facility] Consultation      Name: Laurie Lin MRN: [id number] DOB: 05-09-1955    ADMISSION DATE:  03/21/2017 CONSULTATION DATE:  03/21/17  REFERRING MD :  Dr. Posey Pronto   CHIEF COMPLAINT:   Confusion   HISTORY OF PRESENT ILLNESS   61 years old lady with a past medical history significant for hypertension, diabetes, CKD stage III, GERD ongoing alcohol abuse who was brought to the hospital by her husband with a history of confusion for the past 1 day as well as polyuria since last night.. The patient is a known diabetic and she stopped taking her insulin 1 week ago and has not been checking her blood sugars at home. The patient was found to be in DKA with severe metabolic acidosis and a pH of 6.9. She was also hypothermic and hypotensive at the time of presentation.  She was given 2 L of saline bolus and was started on an insulin drip.  Her blood pressure responded to IV fluids.  Started on a bicarbonate drip.  She was also given a dose of vancomycin and Zosyn in the emergency room.  Cultures were drawn as well.  Lactic acid levels were elevated initially but normalized subsequently.  The patient was placed on a Retail banker.  She was transferred to the ICU On arrival to the ICU the patient has been hemodynamically stable.  She is awake, following commands but has mild confusion.  She states that she stopped taking her insulin because she was having some family issues. She denies chest pain, chest pressure, nausea, vomiting. She says that she vomited yesterday but has not vomited since. She states she has not been eating or drinking well for the last 1 week secondary to nausea.    PAST MEDICAL HISTORY    :  Past Medical History:  Diagnosis Date  . Diabetes mellitus without complication ([place])   . GERD (gastroesophageal reflux disease)   . Hypertension   . Renal disorder    stage 3    Past Surgical History:  Procedure Laterality Date  . EYE SURGERY     bilateral caterac   Prior to Admission medications   Medication Sig Start Date End Date Taking? Authorizing Provider  esomeprazole (NEXIUM) 40 MG capsule Take 40 mg by mouth 2 (two) times daily. 12/25/16  Yes [provider]  furosemide (LASIX) 20 MG tablet Take 20 mg by mouth daily. 02/10/17  Yes [provider]  metoprolol tartrate (LOPRESSOR) 25 MG tablet Take 25 mg by mouth 2 (two) times daily. 12/30/16  Yes [provider]   No Known Allergies   FAMILY HISTORY   Family History  Problem Relation Age of Onset  . Diabetes Mother       SOCIAL HISTORY    reports that  has never smoked. she has never used smokeless tobacco. She reports that she drinks alcohol. She reports that she does not use drugs.  ROS Negative except for given in HPI   [place]:  [92.9 F (33.8 C)-95 F (35 C)] 95 F (35 C) (12/01 1337) Pulse Rate:  [85-98] 96 (12/01 1337) Resp:  [18-22] 18 (12/01 1337) BP: (87-111)/(56-68) 102/68 (12/01 1337) SpO2:  [100 %] 100 % (12/01 1337) Weight:  [171 lb 8.3 oz (77.8 kg)-175 lb (79.4 kg)] 171 lb 8.3 oz (77.8 kg) (12/01 1337) HEMODYNAMICS:   VENTILATOR SETTINGS:   INTAKE / OUTPUT: No intake or output data in the 24 hours ending 03/21/17  [place]   Awake, mildly confused.  Answering questions and following commands. Sclera anicteric. CVS S1, S2, 0. Chest is clear to auscultation bilaterally with no rales rhonchi or wheezes. Abdomen is soft, nontender, nondistended. No lower extremity edema bilaterally.   LABS   LABS:  CBC Recent Labs  Lab 03/21/17 1033  WBC 11.5*  HGB 10.1*  HCT 33.3*  PLT 284   Coag's No results for input(s): APTT, INR in the last 168 hours. BMET Recent Labs  Lab 03/21/17 1033  NA 132*  K 5.3*  CL 93*  CO2 <7*  BUN 27*  CREATININE 2.58*  GLUCOSE 620*   Electrolytes Recent Labs  Lab 03/21/17 1033  CALCIUM 9.1   Sepsis Markers Recent Labs  Lab  03/21/17 1033 03/21/17 1302  LATICACIDVEN 2.3* 1.6   ABG No results for input(s): PHART, PCO2ART, PO2ART in the last 168 hours. Liver Enzymes Recent Labs  Lab 03/21/17 1033  AST 13*  ALT 8*  ALKPHOS 55  BILITOT 2.2*  ALBUMIN 3.1*   Cardiac Enzymes Recent Labs  Lab 03/21/17 1033  TROPONINI <0.03   Glucose Recent Labs  Lab 03/21/17 1315  GLUCAP 576*     No results found for this or any previous visit (from the past 240 hour(s)).   Current Facility-Administered Medications:  .  0.9 %  sodium chloride infusion, , Intravenous, Continuous, Dustin Flock, MD .  0.9 %  sodium chloride infusion, , Intravenous, Continuous, Dustin Flock, MD .  dextrose 5 %-0.45 % sodium chloride infusion, , Intravenous, Continuous, Dustin Flock, MD .  enoxaparin (LOVENOX) injection 30 mg, 30 mg, Subcutaneous, Q24H, Dustin Flock, MD .  folic acid (FOLVITE) tablet 1 mg, 1 mg, Oral, Daily, Dustin Flock, MD .  insulin regular (NOVOLIN R,HUMULIN R) 100 Units in sodium chloride 0.9 % 100 mL (1 Units/mL) infusion, , Intravenous, Continuous, Dustin Flock, MD, Last Rate: 9.9 mL/hr at 03/21/17 1430, 9.9 Units/hr at 03/21/17 1430 .  LORazepam (ATIVAN) tablet 1 mg, 1 mg, Oral, Q6H PRN **OR** LORazepam (ATIVAN) injection 1 mg, 1 mg, Intravenous, Q6H PRN, Dustin Flock, MD .  multivitamin with minerals tablet 1 tablet, 1 tablet, Oral, Daily, Dustin Flock, MD .  pantoprazole (PROTONIX) injection 40 mg, 40 mg, Intravenous, Q12H, Dustin Flock, MD .  thiamine (VITAMIN B-1) tablet 100 mg, 100 mg, Oral, Daily **OR** thiamine (B-1) injection 100 mg, 100 mg, Intravenous, Daily, Dustin Flock, MD  IMAGING    Dg Chest Port 1 View  Result Date: 03/21/2017 CLINICAL DATA:  Confusion, known diabetic, ? Sepsis Pt unresponsive to questions EXAM: PORTABLE CHEST 1 VIEW COMPARISON:  None. FINDINGS: Normal mediastinum and cardiac silhouette. Normal pulmonary vasculature. No evidence of effusion,  infiltrate, or pneumothorax. No acute bony abnormality. IMPRESSION: Normal chest radiograph Electronically Signed   By: Suzy Bouchard M.D.   On: 03/21/2017 11:01       MICRO DATA: MRSA PCR  Urine  Blood Resp     ASSESSMENT/PLAN   61 years old lady with a past medical history significant for hypertension, diabetes, CKD stage III, GERD ongoing alcohol abuse who was brought to the hospital by her husband with a history of confusion for the past 1 day as well as polyuria since last night and was found to be in DKA with severe metabolic acidosis  Problem list:  DKA Severe metabolic acidosis Acute kidney injury ?  Acute versus chronic in a patient with history of CKD stage III GERD Hypertension Alcohol abuse  Anemia - ?  Baseline hemoglobin Elevated bilirubin levels Hypotension on presentation Hypothermia on presentation  Continue DKA protocol. Insulin drip per DKA protocol. At this time the patient is on a bicarbonate drip and sterile water for severe metabolic acidosis. We will repeat a VBG and if the pH is more than 7, will change IV fluids to normal saline per DKA protocol. Serial blood sugar checks every hour. CMP every 4 hours to assess for anion gap closure.  Patient hypotension and hypothermia on presentation are likely secondary to dehydration from DKA. Her hypotension resolved with fluid resuscitation. She is currently on a bear hugger. I do not suspect an infection and cultures have been sent.  Will order pro-calcitonin.  If elevated, will start antibiotics though the patient has already received a dose of vancomycin and Zosyn in the emergency room.  Agree with CIWA protocol for alcohol abuse. Agree with thiamine and folic acid supplementation  I do not know of the patient's baseline renal function is.  She does have a known history of CKD stage III. Will follow daily creatinine levels and monitor urine output.  PPI for GERD  Patient's hemoglobin is 10.  This  could be secondary to alcohol abuse.  Do not have baseline hemoglobin levels. Monitor daily hemoglobins. Transfuse for hemoglobin less than 7  Bilirubin levels elevated.  Monitor daily.  Enoxaparin for DVT prophylaxis  Discussed in detail with the patient and the patient's daughter at bedside     Nettie Elm, M.D.  Pulmonary and Critical Care Medicine   I was approached by the patient's daughters who wanted to speak with me about her in private. They told me that the patient has been depressed for a while now and refuses to seek help. She does not take care of herself, does not bathe or brush her teeth. They were requesting if the patient could be assessed in that regard while she is in the hospital as she refuses to see a doctor outside. I agree that the patient needs evaluation for depression but currently the patient is confused. Will get psychiatry to evaluate the patient once her mental status improves so that they are able to do a full assessment. Plan is to get psych evaluation for depression once the patient's mentation improves.   Nettie Elm, MD Pulmonary and Critical Care Medicine.

## 2017-03-21 NOTE — ED Notes (Signed)
Admitting at bedside 

## 2017-03-21 NOTE — H&P (Signed)
[facility] NAME: Laurie Lin    MR#:  [id number]  DATE OF BIRTH:  03/15/1956  DATE OF ADMISSION:  03/21/2017  PRIMARY CARE PHYSICIAN: Patient, No Pcp Per   REQUESTING/REFERRING PHYSICIAN: Marylyn Ishihara MD  CHIEF COMPLAINT:   Chief Complaint  Patient presents with  . Altered Mental Status    HISTORY OF PRESENT ILLNESS: Laurie Lin  is a 61 y.o. female with a known history of diabetes type 1, essential hypertension, chronic kidney disease stage III and history of heavy alcohol abuse brought in by her husband due to patient being confused since yesterday.  He states that she has been very weak and could not walk yesterday.  Last night she had to go to the bathroom 7 times to urinate.  And has been very thirsty.  Patient's is supposed to be taking insulin however she has stopped about a week ago.  She has also not been checking her sugars.  According to her sister patient has been depressed.  Patient also has been having a cough of whitish sputum.  She also was nauseous yesterday and threw up.  She denies any chest pain or palpitations.  Does have a history of reflux that was severe in the past. PAST MEDICAL HISTORY:   Past Medical History:  Diagnosis Date  . Diabetes mellitus without complication ([place])   . GERD (gastroesophageal reflux disease)   . Hypertension   . Renal disorder    stage 3     PAST SURGICAL HISTORY:  Past Surgical History:  Procedure Laterality Date  . EYE SURGERY     bilateral caterac    SOCIAL HISTORY:  Social History   Tobacco Use  . Smoking status: Never Smoker  . Smokeless tobacco: Never Used  Substance Use Topics  . Alcohol use: Yes    Comment: heavy drinker    FAMILY HISTORY:  Family History  Problem Relation Age of Onset  . Diabetes Mother     DRUG ALLERGIES: No Known Allergies  REVIEW OF SYSTEMS:   CONSTITUTIONAL: No fever, fatigue or weakness.  EYES: No blurred or double vision.   EARS, NOSE, AND THROAT: No tinnitus or ear pain.  RESPIRATORY: No cough, shortness of breath, wheezing or hemoptysis.  CARDIOVASCULAR: No chest pain, orthopnea, edema.  GASTROINTESTINAL: No nausea, vomiting, diarrhea or abdominal pain.  GENITOURINARY: No dysuria, hematuria.  ENDOCRINE: No polyuria, nocturia,  HEMATOLOGY: No anemia, easy bruising or bleeding SKIN: No rash or lesion. MUSCULOSKELETAL: No joint pain or arthritis.   NEUROLOGIC: No tingling, numbness, weakness.  PSYCHIATRY: No anxiety or depression.   MEDICATIONS AT HOME:  Prior to Admission medications   Medication Sig Start Date End Date Taking? Authorizing Provider  esomeprazole (NEXIUM) 40 MG capsule Take 40 mg by mouth 2 (two) times daily. 12/25/16  Yes [provider]  furosemide (LASIX) 20 MG tablet Take 20 mg by mouth daily. 02/10/17  Yes [provider]  metoprolol tartrate (LOPRESSOR) 25 MG tablet Take 25 mg by mouth 2 (two) times daily. 12/30/16  Yes [provider]      PHYSICAL EXAMINATION:   VITAL SIGNS: Blood pressure (!) 92/56, pulse 85, temperature (!) 94.2 F (34.6 C), temperature source Rectal, resp. rate (!) 21, height 5\' 9"  (1.753 m), weight 175 lb (79.4 kg), SpO2 100 %.  GENERAL:  61 y.o.-year-old patient lying in the bed appears dry critically ill EYES: Pupils equal, round, reactive to light and accommodation. No scleral icterus. Extraocular  muscles intact.  HEENT: Head atraumatic, normocephalic. Oropharynx and nasopharynx dry NECK:  Supple, no jugular venous distention. No thyroid enlargement, no tenderness.  LUNGS: Normal breath sounds bilaterally, no wheezing, rales,rhonchi or crepitation. No use of accessory muscles of respiration.  CARDIOVASCULAR: S1, S2 normal. No murmurs, rubs, or gallops.  ABDOMEN: Soft, nontender, nondistended. Bowel sounds present. No organomegaly or mass.  EXTREMITIES: No pedal edema, cyanosis, or clubbing.  Right great toe there is an area of cut  with no evidence of infection  NEUROLOGIC: Cranial nerves II through XII are intact. Muscle strength 5/5 in all extremities. Sensation intact. Gait not checked.  PSYCHIATRIC: The patient is alert and oriented x 3.  SKIN: No obvious rash, lesion, or ulcer.   LABORATORY PANEL:   CBC Recent Labs  Lab 03/21/17 1033  WBC 11.5*  HGB 10.1*  HCT 33.3*  PLT 284  MCV 109.1*  MCH 33.0  MCHC 30.3*  RDW 15.4*  LYMPHSABS 0.5*  MONOABS 0.9  EOSABS 0.0  BASOSABS 0.1   ------------------------------------------------------------------------------------------------------------------  Chemistries  Recent Labs  Lab 03/21/17 1033  NA 132*  K 5.3*  CL 93*  CO2 <7*  GLUCOSE 620*  BUN 27*  CREATININE 2.58*  CALCIUM 9.1  AST 13*  ALT 8*  ALKPHOS 55  BILITOT 2.2*   ------------------------------------------------------------------------------------------------------------------ estimated creatinine clearance is 23.6 mL/min (A) (by C-G formula based on SCr of 2.58 mg/dL (H)). ------------------------------------------------------------------------------------------------------------------ No results for input(s): TSH, T4TOTAL, T3FREE, THYROIDAB in the last 72 hours.  Invalid input(s): FREET3   Coagulation profile No results for input(s): INR, PROTIME in the last 168 hours. ------------------------------------------------------------------------------------------------------------------- No results for input(s): DDIMER in the last 72 hours. -------------------------------------------------------------------------------------------------------------------  Cardiac Enzymes Recent Labs  Lab 03/21/17 1033  TROPONINI <0.03   ------------------------------------------------------------------------------------------------------------------ Invalid input(s):  POCBNP  ---------------------------------------------------------------------------------------------------------------  Urinalysis    Component Value Date/Time   COLORURINE YELLOW (A) 03/21/2017 1106   APPEARANCEUR HAZY (A) 03/21/2017 1106   LABSPEC 1.014 03/21/2017 1106   PHURINE 5.0 03/21/2017 1106   GLUCOSEU >=500 (A) 03/21/2017 1106   HGBUR MODERATE (A) 03/21/2017 1106   BILIRUBINUR NEGATIVE 03/21/2017 1106   KETONESUR 20 (A) 03/21/2017 1106   PROTEINUR 100 (A) 03/21/2017 1106   NITRITE NEGATIVE 03/21/2017 1106   LEUKOCYTESUR SMALL (A) 03/21/2017 1106     RADIOLOGY: Dg Chest Port 1 View  Result Date: 03/21/2017 CLINICAL DATA:  Confusion, known diabetic, ? Sepsis Pt unresponsive to questions EXAM: PORTABLE CHEST 1 VIEW COMPARISON:  None. FINDINGS: Normal mediastinum and cardiac silhouette. Normal pulmonary vasculature. No evidence of effusion, infiltrate, or pneumothorax. No acute bony abnormality. IMPRESSION: Normal chest radiograph Electronically Signed   By: Suzy Bouchard M.D.   On: 03/21/2017 11:01    EKG: Orders placed or performed during the hospital encounter of 03/21/17  . ED EKG 12-Lead  . ED EKG 12-Lead    IMPRESSION AND PLAN: The patient is a 61 year old white female with history of diabetes type 1 presenting with confusion  1.  DKA We will give patient 1 more liter of normal saline she is received 2 L We will give her aggressive normal saline after the bolus Will place her on DKA insulin protocol Follow blood glucose every hourly BMP every 4 hours until on the insulin drip  2.  Hypotension suspect due to severe dehydration and DKA  Give IV fluids  3.  Hypothermia Suspect this is again related to DKA and dehydration Her chest x-ray is negative, urinalysis is negative Follow blood cultures  4.  Alcohol  abuse CIWA protocol  5.  Acute renal failure on chronic kidney disease stage III likely due to prerenal give IV fluids monitor renal function  6.   Severe depression ask psych input  7.  GERD Protonix 40 mg IV daily until able to take p.o. properly    All the records are reviewed and case discussed with ED provider. Management plans discussed with the patient, family and they are in agreement.  CODE STATUS: Code Status History    This patient does not have a recorded code status. Please follow your organizational policy for patients in this situation.       TOTAL TIME TAKING CARE OF THIS PATIENT:27minutes critical care time spent.    Dustin Flock M.D on 03/21/2017 at 1:00 PM  Between 7am to 6pm - Pager - [phone]  After 6pm go to [email or web address] - password [email or web address] [facility]  Office  [phone]  CC: Primary care physician; Patient, No Pcp Per

## 2017-03-21 NOTE — ED Notes (Signed)
Date and time results received: 03/21/17 1154 (use smartphrase ".now" to insert current time)  Test: Glucose Critical Value: 620  Name of Provider Notified: Dr. Legrand Rams  Orders Received? Or Actions Taken?: Notified

## 2017-03-21 NOTE — ED Notes (Addendum)
Date and time results received: 03/21/17 1141 (use smartphrase ".now" to insert current time)  Test: Lactic Critical Value: 2.3  Name of Provider Notified: Dr Carilyn Goodpasture  Orders Received? Or Actions Taken?: notifed

## 2017-03-21 NOTE — ED Triage Notes (Signed)
EMNS reports Hx Alcohol abuse, brought in for AMS. Poor historian

## 2017-03-21 NOTE — ED Notes (Signed)
bair hugger placed by this RN to pt with warm blankets on top.

## 2017-03-22 ENCOUNTER — Inpatient Hospital Stay: Payer: BLUE CROSS/BLUE SHIELD

## 2017-03-22 LAB — GLUCOSE, CAPILLARY
GLUCOSE-CAPILLARY: 124 mg/dL — AB (ref 65–99)
GLUCOSE-CAPILLARY: 126 mg/dL — AB (ref 65–99)
GLUCOSE-CAPILLARY: 148 mg/dL — AB (ref 65–99)
GLUCOSE-CAPILLARY: 156 mg/dL — AB (ref 65–99)
GLUCOSE-CAPILLARY: 159 mg/dL — AB (ref 65–99)
GLUCOSE-CAPILLARY: 180 mg/dL — AB (ref 65–99)
GLUCOSE-CAPILLARY: 188 mg/dL — AB (ref 65–99)
GLUCOSE-CAPILLARY: 99 mg/dL (ref 65–99)
Glucose-Capillary: 168 mg/dL — ABNORMAL HIGH (ref 65–99)
Glucose-Capillary: 152 mg/dL — ABNORMAL HIGH (ref 65–99)
Glucose-Capillary: 148 mg/dL — ABNORMAL HIGH (ref 65–99)
Glucose-Capillary: 133 mg/dL — ABNORMAL HIGH (ref 65–99)
Glucose-Capillary: 83 mg/dL (ref 65–99)
Glucose-Capillary: 104 mg/dL — ABNORMAL HIGH (ref 65–99)

## 2017-03-22 LAB — BASIC METABOLIC PANEL
ANION GAP: 10 (ref 5–15)
ANION GAP: 11 (ref 5–15)
Anion gap: 12 (ref 5–15)
Anion gap: 12 (ref 5–15)
Anion gap: 13 (ref 5–15)
Anion gap: 15 (ref 5–15)
BUN: 27 mg/dL — AB (ref 6–20)
BUN: 28 mg/dL — AB (ref 6–20)
BUN: 28 mg/dL — AB (ref 6–20)
BUN: 29 mg/dL — ABNORMAL HIGH (ref 6–20)
BUN: 29 mg/dL — ABNORMAL HIGH (ref 6–20)
BUN: 29 mg/dL — ABNORMAL HIGH (ref 6–20)
CALCIUM: 8 mg/dL — AB (ref 8.9–10.3)
CALCIUM: 7.8 mg/dL — AB (ref 8.9–10.3)
CALCIUM: 7.5 mg/dL — AB (ref 8.9–10.3)
CHLORIDE: 103 mmol/L (ref 101–111)
CHLORIDE: 103 mmol/L (ref 101–111)
CHLORIDE: 104 mmol/L (ref 101–111)
CHLORIDE: 105 mmol/L (ref 101–111)
CO2: 19 mmol/L — AB (ref 22–32)
CO2: 20 mmol/L — AB (ref 22–32)
CO2: 20 mmol/L — AB (ref 22–32)
CO2: 22 mmol/L (ref 22–32)
CO2: 22 mmol/L (ref 22–32)
CO2: 23 mmol/L (ref 22–32)
CREATININE: 2.61 mg/dL — AB (ref 0.44–1.00)
CREATININE: 2.65 mg/dL — AB (ref 0.44–1.00)
CREATININE: 2.88 mg/dL — AB (ref 0.44–1.00)
CREATININE: 2.9 mg/dL — AB (ref 0.44–1.00)
CREATININE: 3.3 mg/dL — AB (ref 0.44–1.00)
Calcium: 8.3 mg/dL — ABNORMAL LOW (ref 8.9–10.3)
Calcium: 8.3 mg/dL — ABNORMAL LOW (ref 8.9–10.3)
Calcium: 8.3 mg/dL — ABNORMAL LOW (ref 8.9–10.3)
Chloride: 103 mmol/L (ref 101–111)
Chloride: 105 mmol/L (ref 101–111)
Creatinine, Ser: 2.87 mg/dL — ABNORMAL HIGH (ref 0.44–1.00)
GFR calc non Af Amer: 14 mL/min — ABNORMAL LOW (ref >60)
GFR calc non Af Amer: 16 mL/min — ABNORMAL LOW (ref >60)
GFR calc non Af Amer: 16 mL/min — ABNORMAL LOW (ref >60)
GFR calc non Af Amer: 17 mL/min — ABNORMAL LOW (ref >60)
GFR calc non Af Amer: 18 mL/min — ABNORMAL LOW (ref >60)
GFR calc non Af Amer: 19 mL/min — ABNORMAL LOW (ref >60)
GFR, EST AFRICAN AMERICAN: 16 mL/min — AB (ref >60)
GFR, EST AFRICAN AMERICAN: 19 mL/min — AB (ref >60)
GFR, EST AFRICAN AMERICAN: 19 mL/min — AB (ref >60)
GFR, EST AFRICAN AMERICAN: 19 mL/min — AB (ref >60)
GFR, EST AFRICAN AMERICAN: 21 mL/min — AB (ref >60)
GFR, EST AFRICAN AMERICAN: 21 mL/min — AB (ref >60)
GLUCOSE: 137 mg/dL — AB (ref 65–99)
GLUCOSE: 162 mg/dL — AB (ref 65–99)
GLUCOSE: 197 mg/dL — AB (ref 65–99)
Glucose, Bld: 139 mg/dL — ABNORMAL HIGH (ref 65–99)
Glucose, Bld: 86 mg/dL (ref 65–99)
Glucose, Bld: 107 mg/dL — ABNORMAL HIGH (ref 65–99)
Potassium: 3.3 mmol/L — ABNORMAL LOW (ref 3.5–5.1)
Potassium: 3.3 mmol/L — ABNORMAL LOW (ref 3.5–5.1)
Potassium: 3.4 mmol/L — ABNORMAL LOW (ref 3.5–5.1)
Potassium: 3.5 mmol/L (ref 3.5–5.1)
Potassium: 3.5 mmol/L (ref 3.5–5.1)
Potassium: 3.6 mmol/L (ref 3.5–5.1)
SODIUM: 138 mmol/L (ref 135–145)
Sodium: 136 mmol/L (ref 135–145)
Sodium: 136 mmol/L (ref 135–145)
Sodium: 137 mmol/L (ref 135–145)
Sodium: 137 mmol/L (ref 135–145)
Sodium: 138 mmol/L (ref 135–145)

## 2017-03-22 LAB — URINE CULTURE: Culture: NO GROWTH

## 2017-03-22 LAB — PHOSPHORUS
PHOSPHORUS: 1.7 mg/dL — AB (ref 2.5–4.6)
PHOSPHORUS: 3.5 mg/dL (ref 2.5–4.6)
PHOSPHORUS: 3.8 mg/dL (ref 2.5–4.6)
PHOSPHORUS: 3.7 mg/dL (ref 2.5–4.6)
Phosphorus: 3.9 mg/dL (ref 2.5–4.6)
Phosphorus: <1 mg/dL — CL (ref 2.5–4.6)

## 2017-03-22 LAB — RETICULOCYTES
RBC.: 2.57 MIL/uL — ABNORMAL LOW (ref 3.80–5.20)
RETIC CT PCT: 0.7 % (ref 0.4–3.1)
Retic Count, Absolute: 18 10*3/uL — ABNORMAL LOW (ref 19.0–183.0)

## 2017-03-22 LAB — LACTATE DEHYDROGENASE: LDH: 128 U/L (ref 98–192)

## 2017-03-22 LAB — MAGNESIUM
MAGNESIUM: 2 mg/dL (ref 1.7–2.4)
MAGNESIUM: 1.9 mg/dL (ref 1.7–2.4)
MAGNESIUM: 1.7 mg/dL (ref 1.7–2.4)
Magnesium: 1.7 mg/dL (ref 1.7–2.4)
Magnesium: 1.8 mg/dL (ref 1.7–2.4)
Magnesium: 2.3 mg/dL (ref 1.7–2.4)

## 2017-03-22 LAB — CBC
HEMATOCRIT: 23.6 % — AB (ref 35.0–47.0)
Hemoglobin: 7.9 g/dL — ABNORMAL LOW (ref 12.0–16.0)
MCH: 32.1 pg (ref 26.0–34.0)
MCHC: 33.4 g/dL (ref 32.0–36.0)
MCV: 96.2 fL (ref 80.0–100.0)
Platelets: 228 10*3/uL (ref 150–440)
RBC: 2.45 MIL/uL — ABNORMAL LOW (ref 3.80–5.20)
RDW: 14.6 % — AB (ref 11.5–14.5)
WBC: 8.5 10*3/uL (ref 3.6–11.0)

## 2017-03-22 LAB — AMMONIA: AMMONIA: 20 umol/L (ref 9–35)

## 2017-03-22 LAB — PROCALCITONIN: Procalcitonin: 0.5 ng/mL

## 2017-03-22 LAB — HEMOGLOBIN AND HEMATOCRIT, BLOOD
HEMATOCRIT: 24.5 % — AB (ref 35.0–47.0)
Hemoglobin: 8.3 g/dL — ABNORMAL LOW (ref 12.0–16.0)

## 2017-03-22 MED ORDER — INSULIN ASPART 100 UNIT/ML ~~LOC~~ SOLN
2.0000 [IU] | SUBCUTANEOUS | Status: DC
  Administered 2017-03-22 – 2017-03-23 (×2): 2 [IU] via SUBCUTANEOUS
  Administered 2017-03-23: 4 [IU] via SUBCUTANEOUS
  Administered 2017-03-23: 2 [IU] via SUBCUTANEOUS
  Filled 2017-03-22: qty 1

## 2017-03-22 MED ORDER — SODIUM CHLORIDE 0.9 % IV BOLUS (SEPSIS)
1000.0000 mL | Freq: Once | INTRAVENOUS | Status: AC
  Administered 2017-03-22: 1000 mL via INTRAVENOUS

## 2017-03-22 MED ORDER — INSULIN GLARGINE 100 UNIT/ML ~~LOC~~ SOLN
40.0000 [IU] | SUBCUTANEOUS | Status: DC
  Administered 2017-03-22 – 2017-03-23 (×2): 40 [IU] via SUBCUTANEOUS
  Filled 2017-03-22 (×3): qty 0.4

## 2017-03-22 NOTE — Progress Notes (Signed)
[facility] NAME: Laurie Lin    MR#:  [id number]  DATE OF BIRTH:  03-Oct-1955  SUBJECTIVE:   Patient here and noted to be in acute diabetic ketoacidosis. Transition off the insulin drip and was placed on scheduled insulin. Denies any nausea vomiting. Patient's family is at bedside.  Still remains a bit confused.   REVIEW OF SYSTEMS:    Review of Systems  Constitutional: Negative for chills and fever.  HENT: Negative for congestion and tinnitus.   Eyes: Negative for blurred vision and double vision.  Respiratory: Negative for cough, shortness of breath and wheezing.   Cardiovascular: Negative for chest pain, orthopnea and PND.  Gastrointestinal: Negative for abdominal pain, diarrhea, nausea and vomiting.  Genitourinary: Negative for dysuria and hematuria.  Neurological: Negative for dizziness, sensory change and focal weakness.  All other systems reviewed and are negative.   Nutrition: Carb control Tolerating Diet: Yes Tolerating PT: Await Eval.      DRUG ALLERGIES:  No Known Allergies  VITALS:  Blood pressure (!) 110/58, pulse (!) 101, temperature 100 F (37.8 C), resp. rate 15, height 5\' 10"  (1.778 m), weight 77.8 kg (171 lb 8.3 oz), SpO2 97 %.  PHYSICAL EXAMINATION:   Physical Exam  GENERAL:  61 y.o.-year-old patient lying in bed lethargic but in NAD.  EYES: Pupils equal, round, reactive to light and accommodation. No scleral icterus. Extraocular muscles intact.  HEENT: Head atraumatic, normocephalic. Oropharynx and nasopharynx clear.  NECK:  Supple, no jugular venous distention. No thyroid enlargement, no tenderness.  LUNGS: Normal breath sounds bilaterally, no wheezing, rales, rhonchi. No use of accessory muscles of respiration.  CARDIOVASCULAR: S1, S2 normal. No murmurs, rubs, or gallops.  ABDOMEN: Soft, mild tenderness on palpation, no rebound, rigidity, nondistended. Bowel sounds present. No organomegaly or  mass.  EXTREMITIES: No cyanosis, clubbing or edema b/l.    NEUROLOGIC: Cranial nerves II through XII are intact. No focal Motor or sensory deficits b/l.  Globally weak PSYCHIATRIC: The patient is alert and oriented x 3.  SKIN: No obvious rash, lesion, or ulcer.    LABORATORY PANEL:   CBC Recent Labs  Lab 03/22/17 0418 03/22/17 1218  WBC 8.5  --   HGB 7.9* 8.3*  HCT 23.6* 24.5*  PLT 228  --    ------------------------------------------------------------------------------------------------------------------  Chemistries  Recent Labs  Lab 03/21/17 1033  03/22/17 1218  NA 132*   < > 138  K 5.3*   < > 3.5  CL 93*   < > 103  CO2 <7*   < > 22  GLUCOSE 620*   < > 86  BUN 27*   < > 28*  CREATININE 2.58*   < > 2.90*  CALCIUM 9.1   < > 8.0*  MG  --    < > 1.8  AST 13*  --   --   ALT 8*  --   --   ALKPHOS 55  --   --   BILITOT 2.2*  --   --    < > = values in this interval not displayed.   ------------------------------------------------------------------------------------------------------------------  Cardiac Enzymes Recent Labs  Lab 03/21/17 1033  TROPONINI <0.03   ------------------------------------------------------------------------------------------------------------------  RADIOLOGY:  Dg Chest Port 1 View  Result Date: 03/21/2017 CLINICAL DATA:  Confusion, known diabetic, ? Sepsis Pt unresponsive to questions EXAM: PORTABLE CHEST 1 VIEW COMPARISON:  None. FINDINGS: Normal mediastinum and cardiac silhouette. Normal pulmonary vasculature. No evidence of effusion, infiltrate, or pneumothorax.  No acute bony abnormality. IMPRESSION: Normal chest radiograph Electronically Signed   By: Suzy Bouchard M.D.   On: 03/21/2017 11:01     ASSESSMENT AND PLAN:   61 year old female with past medical history of hypertension, GERD, diabetes, chronic kidney disease stage IV who presents to the hospital due to altered mental status and noted to be in acute diabetic  ketoacidosis.  1. Acute diabetic ketoacidosis-suspect secondary to patient's noncompliance as she has not taken her insulin. -Much improved anion gap is closed, transitioned off the insulin drip and placed on scheduled insulin. -Electrolytes stable we'll continue to monitor.  2. Altered mental status-secondary to acute DKA and also possibly underlying liver disease. Patient has a long history of alcohol abuse. -I will check an ammonia level and if elevated consider starting the patient on lactulose.  3. Alcohol abuse-continue CIWA protocol. -High risk for withdrawal. Cont. Thiamine, Folate.   4. Chronic kidney disease stage IV-creatinine is close to baseline and will continue to monitor.  5. Anemia of chronic disease - hg. Stable.  Likely due to CKD and will cont. To monitor.    All the records are reviewed and case discussed with Care Management/Social Worker. Management plans discussed with the patient, family and they are in agreement.  CODE STATUS: Full code  DVT Prophylaxis: TEd's & SCD's.    TOTAL TIME TAKING CARE OF THIS PATIENT: 30 minutes.   POSSIBLE D/C IN 1-2 DAYS, DEPENDING ON CLINICAL CONDITION.   Henreitta Leber M.D on 03/22/2017 at 2:03 PM  Between 7am to 6pm - Pager - [phone]  After 6pm go to [email or web address] - Proofreader  [facility]  Office  [phone]  CC: Primary care physician; Patient, No Pcp Per

## 2017-03-22 NOTE — Progress Notes (Signed)
ICU glycemic protocol stopped, insulin gtt discontinued, NS started per order.  CBG at 12 noon not covered, not needed.  CBG's will be q.4h.  Family at bs and updated by Dr. Humphrey Rolls.  Bubba Camp, RN

## 2017-03-22 NOTE — Progress Notes (Signed)
[name] Consultation     Date: 03/22/2017,   MRN# [id number] Laurie Lin 1955-12-02 Code Status:     Code Status Orders  (From admission, onward)        Start     Ordered   03/21/17 1416  Full code  Continuous     03/21/17 1415    Code Status History    Date Active Date Inactive Code Status Order ID Comments User Context   This patient has a current code status but no historical code status.       SUBJECTIVE:   DKA resolved. Patient confused.  Active complaints. Hemoglobin drop noted.  No overt bleed.   MEDICATIONS    Home Medication:    Current Medication:   Current Facility-Administered Medications:  .  0.9 %  sodium chloride infusion, , Intravenous, Continuous, Dustin Flock, MD, Last Rate: 125 mL/hr at 03/22/17 1246, 125 mL/hr at 03/22/17 1246 .  folic acid (FOLVITE) tablet 1 mg, 1 mg, Oral, Daily, Dustin Flock, MD, 1 mg at 03/21/17 1514 .  insulin aspart (novoLOG) injection 2-6 Units, 2-6 Units, Subcutaneous, Q4H, Tukov, Magadalene S, NP .  insulin glargine (LANTUS) injection 40 Units, 40 Units, Subcutaneous, Q24H, Tukov, Magadalene S, NP, 40 Units at 03/22/17 0928 .  LORazepam (ATIVAN) tablet 1 mg, 1 mg, Oral, Q6H PRN **OR** LORazepam (ATIVAN) injection 1 mg, 1 mg, Intravenous, Q6H PRN, Dustin Flock, MD, 1 mg at 03/22/17 0054 .  multivitamin with minerals tablet 1 tablet, 1 tablet, Oral, Daily, Dustin Flock, MD, 1 tablet at 03/21/17 1513 .  ondansetron (ZOFRAN) injection 4 mg, 4 mg, Intravenous, Q6H PRN, Nettie Elm, MD, 4 mg at 03/21/17 1938 .  pantoprazole (PROTONIX) injection 40 mg, 40 mg, Intravenous, Q12H, Dustin Flock, MD, 40 mg at 03/22/17 1100 .  thiamine (VITAMIN B-1) tablet 100 mg, 100 mg, Oral, Daily, 100 mg at 03/21/17 1514 **OR** thiamine (B-1) injection 100 mg, 100 mg, Intravenous, Daily, Dustin Flock, MD    VS: BP 114/61   Pulse 100   Temp 100.2 F (37.9 C)   Resp 16   Ht 5\' 10"  (1.778 m)   Wt  171 lb 8.3 oz (77.8 kg)   LMP  (LMP Unknown)   SpO2 95%   BMI 24.61 kg/m      PHYSICAL EXAM    Awake, mildly confused.  Following commands. Sclera anicteric. CVS S1, S2, 0. Chest is clear to auscultation bilaterally with no rales rhonchi or wheezes. Abdomen is soft, nontender, nondistended. No lower extremity edema bilaterally.      LABS    Recent Labs    03/21/17 1033 03/21/17 1508  03/22/17 0418 03/22/17 0802 03/22/17 1218 03/22/17 1547  HGB 10.1* 9.0*  --  7.9*  --  8.3*  --   HCT 33.3* 28.4*  --  23.6*  --  24.5*  --   MCV 109.1* 104.2*  --  96.2  --   --   --   WBC 11.5* 8.4  --  8.5  --   --   --   BUN 27* 29*   < > 29* 29* 28* 28*  CREATININE 2.58* 2.71*   < > 2.61* 2.87* 2.90* 2.88*  GLUCOSE 620* 570*   < > 162* 139* 86 107*  CALCIUM 9.1 8.2*   < > 8.3* 8.3* 8.0* 7.8*   < > = values in this interval not displayed.  ,    No results for input(s): PH in the last 72  hours.  Invalid input(s): PCO2, PO2, BASEEXCESS, BASEDEFICITE, TFT    CULTURE RESULTS   Recent Results (from the past 240 hour(s))  Blood Culture (routine x 2)     Status: None (Preliminary result)   Collection Time: 03/21/17 10:33 AM  Result Value Ref Range Status   Specimen Description BLOOD Blood Culture adequate volume  Final   Special Requests   Final    BOTTLES DRAWN AEROBIC AND ANAEROBIC RIGHT ANTECUBITAL   Culture NO GROWTH < 24 HOURS  Final   Report Status PENDING  Incomplete  Blood Culture (routine x 2)     Status: None (Preliminary result)   Collection Time: 03/21/17 11:11 AM  Result Value Ref Range Status   Specimen Description BLOOD Blood Culture adequate volume  Final   Special Requests   Final    BOTTLES DRAWN AEROBIC AND ANAEROBIC BLOOD RIGHT HAND   Culture NO GROWTH < 24 HOURS  Final   Report Status PENDING  Incomplete  MRSA PCR Screening     Status: None   Collection Time: 03/21/17  2:45 PM  Result Value Ref Range Status   MRSA by PCR NEGATIVE NEGATIVE Final     Comment:        The GeneXpert MRSA Assay (FDA approved for NASAL specimens only), is one component of a comprehensive MRSA colonization surveillance program. It is not intended to diagnose MRSA infection nor to guide or monitor treatment for MRSA infections.   Urine Culture     Status: None   Collection Time: 03/21/17  3:51 PM  Result Value Ref Range Status   Specimen Description URINE, RANDOM  Final   Special Requests NONE  Final   Culture   Final    NO GROWTH Performed at [facility], [address] [place], [place] [address]    Report Status 03/22/2017 FINAL  Final          IMAGING    US Abdomen Complete  Result Date: 03/22/2017 CLINICAL DATA:  Cirrhosis. EXAM: ABDOMEN ULTRASOUND COMPLETE COMPARISON:  None. FINDINGS: Gallbladder: No gallstones or wall thickening visualized. No sonographic Murphy sign noted by sonographer. Common bile duct: Diameter: 2 mm Liver: Diffuse increased echogenicity with no focal mass. Portal vein is patent on color Doppler imaging with normal direction of blood flow towards the liver. IVC: No abnormality visualized. Pancreas: Visualized portion unremarkable. Spleen: Size and appearance within normal limits. Right Kidney: Length: 11 cm. Echogenicity within normal limits. No mass or hydronephrosis visualized. Left Kidney: Length: 10.2 cm. Echogenicity within normal limits. No mass or hydronephrosis visualized. Abdominal aorta: No aneurysm visualized. Other findings: None. IMPRESSION: 1. Diffuse increased echogenicity in the liver without focal mass is nonspecific but could be due to the patient's reported cirrhosis or hepatic steatosis. Normal directional blood flow in the portal vein. No splenomegaly. Electronically Signed   By: Dorise Bullion III M.D   On: 03/22/2017 15:21         ASSESSMENT/PLAN   61 years old lady with a past medical history significant for hypertension, diabetes, CKD stage III, GERD ongoing alcohol abuse who was  brought to the hospital by her husband with a history of confusion for the past 1 day as well as polyuria since last night and was found to be in DKA with severe metabolic acidosis  Problem list:  DKA - resolved Severe metabolic acidosis - resolved Acute kidney injury ?  Acute versus chronic in a patient with history of CKD stage III Encephalopathy - likely  early DTs GERD Hypertension Alcohol abuse Possible depression Anemia - ?  Baseline hemoglobin Elevated bilirubin levels Hypotension on presentation - resolved Hypothermia on presentation - resolved  DKA resolved. Patient transitioned to subcu insulin. Continue monitoring blood sugar levels.  Continue CIWA protocol for alcohol abuse. Cnotinue thiamine and folic acid supplementation  Patient's baseline renal function is unknown.  She does have a known history of CKD stage III. U/O low earlier today. Cr bump noted. Fluid bolus given and IVF rate increased. If the creatinine worsens, will consult nephrology. Will follow daily creatinine levels and monitor urine output.   PPI for GERD  Anemia noted. Unclear what her baseline is. She may have chronic anemia given her alcohol abuse. Hgb rechecked in the afternoon and is stable. Monitor daily hemoglobins. Transfuse for hemoglobin less than 7 Enoxaparin stopped given her progressive anemia. No overt bleeding seen though. Check stool for heme occult. Check LDH, retic count, haptoglobin.  Proclacitonin elevated but the patient is afebrile and does not have leukocytosis. Trend procalcitonin tomorrow.  Bilirubin levels elevated.  Monitor daily. Abd [place]  Psych consult for depression when the patient's mentation improves.  Enoxaparin for DVT prophylaxis   Discussed with her husband  Nettie Elm, M.D.  Pulmonary and Critical Care Medicine

## 2017-03-23 ENCOUNTER — Inpatient Hospital Stay: Payer: BLUE CROSS/BLUE SHIELD

## 2017-03-23 DIAGNOSIS — G9341 Metabolic encephalopathy: Secondary | ICD-10-CM

## 2017-03-23 LAB — GLUCOSE, CAPILLARY
GLUCOSE-CAPILLARY: 100 mg/dL — AB (ref 65–99)
GLUCOSE-CAPILLARY: 50 mg/dL — AB (ref 65–99)
GLUCOSE-CAPILLARY: 50 mg/dL — AB (ref 65–99)
GLUCOSE-CAPILLARY: 59 mg/dL — AB (ref 65–99)
GLUCOSE-CAPILLARY: 82 mg/dL (ref 65–99)
Glucose-Capillary: 108 mg/dL — ABNORMAL HIGH (ref 65–99)
Glucose-Capillary: 115 mg/dL — ABNORMAL HIGH (ref 65–99)
Glucose-Capillary: 173 mg/dL — ABNORMAL HIGH (ref 65–99)
Glucose-Capillary: 74 mg/dL (ref 65–99)
Glucose-Capillary: 86 mg/dL (ref 65–99)

## 2017-03-23 LAB — BASIC METABOLIC PANEL
ANION GAP: 10 (ref 5–15)
ANION GAP: 13 (ref 5–15)
Anion gap: 10 (ref 5–15)
Anion gap: 11 (ref 5–15)
Anion gap: 13 (ref 5–15)
Anion gap: 13 (ref 5–15)
BUN: 26 mg/dL — AB (ref 6–20)
BUN: 27 mg/dL — AB (ref 6–20)
BUN: 27 mg/dL — AB (ref 6–20)
BUN: 27 mg/dL — AB (ref 6–20)
BUN: 28 mg/dL — AB (ref 6–20)
BUN: 28 mg/dL — ABNORMAL HIGH (ref 6–20)
CALCIUM: 7.4 mg/dL — AB (ref 8.9–10.3)
CALCIUM: 7.4 mg/dL — AB (ref 8.9–10.3)
CALCIUM: 7.4 mg/dL — AB (ref 8.9–10.3)
CALCIUM: 7.4 mg/dL — AB (ref 8.9–10.3)
CALCIUM: 7.2 mg/dL — AB (ref 8.9–10.3)
CHLORIDE: 106 mmol/L (ref 101–111)
CO2: 20 mmol/L — ABNORMAL LOW (ref 22–32)
CO2: 19 mmol/L — ABNORMAL LOW (ref 22–32)
CO2: 20 mmol/L — AB (ref 22–32)
CO2: 19 mmol/L — ABNORMAL LOW (ref 22–32)
CO2: 19 mmol/L — ABNORMAL LOW (ref 22–32)
CO2: 19 mmol/L — ABNORMAL LOW (ref 22–32)
CREATININE: 3.26 mg/dL — AB (ref 0.44–1.00)
CREATININE: 3.31 mg/dL — AB (ref 0.44–1.00)
Calcium: 7.6 mg/dL — ABNORMAL LOW (ref 8.9–10.3)
Chloride: 107 mmol/L (ref 101–111)
Chloride: 106 mmol/L (ref 101–111)
Chloride: 105 mmol/L (ref 101–111)
Chloride: 106 mmol/L (ref 101–111)
Chloride: 108 mmol/L (ref 101–111)
Creatinine, Ser: 3.23 mg/dL — ABNORMAL HIGH (ref 0.44–1.00)
Creatinine, Ser: 3.41 mg/dL — ABNORMAL HIGH (ref 0.44–1.00)
Creatinine, Ser: 3.4 mg/dL — ABNORMAL HIGH (ref 0.44–1.00)
Creatinine, Ser: 3.32 mg/dL — ABNORMAL HIGH (ref 0.44–1.00)
GFR calc Af Amer: 16 mL/min — ABNORMAL LOW (ref >60)
GFR calc Af Amer: 16 mL/min — ABNORMAL LOW (ref >60)
GFR calc Af Amer: 16 mL/min — ABNORMAL LOW (ref >60)
GFR calc Af Amer: 16 mL/min — ABNORMAL LOW (ref >60)
GFR calc Af Amer: 16 mL/min — ABNORMAL LOW (ref >60)
GFR calc Af Amer: 17 mL/min — ABNORMAL LOW (ref >60)
GFR calc non Af Amer: 14 mL/min — ABNORMAL LOW (ref >60)
GFR calc non Af Amer: 14 mL/min — ABNORMAL LOW (ref >60)
GFR, EST NON AFRICAN AMERICAN: 14 mL/min — AB (ref >60)
GFR, EST NON AFRICAN AMERICAN: 13 mL/min — AB (ref >60)
GFR, EST NON AFRICAN AMERICAN: 13 mL/min — AB (ref >60)
GFR, EST NON AFRICAN AMERICAN: 14 mL/min — AB (ref >60)
GLUCOSE: 122 mg/dL — AB (ref 65–99)
GLUCOSE: 154 mg/dL — AB (ref 65–99)
GLUCOSE: 58 mg/dL — AB (ref 65–99)
GLUCOSE: 76 mg/dL (ref 65–99)
Glucose, Bld: 111 mg/dL — ABNORMAL HIGH (ref 65–99)
Glucose, Bld: 159 mg/dL — ABNORMAL HIGH (ref 65–99)
POTASSIUM: 3.6 mmol/L (ref 3.5–5.1)
POTASSIUM: 3.8 mmol/L (ref 3.5–5.1)
Potassium: 3.6 mmol/L (ref 3.5–5.1)
Potassium: 3.7 mmol/L (ref 3.5–5.1)
Potassium: 3.8 mmol/L (ref 3.5–5.1)
Potassium: 3.8 mmol/L (ref 3.5–5.1)
SODIUM: 137 mmol/L (ref 135–145)
SODIUM: 137 mmol/L (ref 135–145)
SODIUM: 138 mmol/L (ref 135–145)
Sodium: 137 mmol/L (ref 135–145)
Sodium: 137 mmol/L (ref 135–145)
Sodium: 138 mmol/L (ref 135–145)

## 2017-03-23 LAB — PROTIME-INR
INR: 0.98
Prothrombin Time: 12.9 seconds (ref 11.4–15.2)

## 2017-03-23 LAB — CBC
HCT: 24 % — ABNORMAL LOW (ref 35.0–47.0)
HEMOGLOBIN: 8.1 g/dL — AB (ref 12.0–16.0)
MCH: 32.5 pg (ref 26.0–34.0)
MCHC: 33.7 g/dL (ref 32.0–36.0)
MCV: 96.6 fL (ref 80.0–100.0)
PLATELETS: 198 10*3/uL (ref 150–440)
RBC: 2.48 MIL/uL — ABNORMAL LOW (ref 3.80–5.20)
RDW: 14.7 % — ABNORMAL HIGH (ref 11.5–14.5)
WBC: 9.4 10*3/uL (ref 3.6–11.0)

## 2017-03-23 LAB — HEMOGLOBIN A1C
HEMOGLOBIN A1C: 12.3 % — AB (ref 4.8–5.6)
MEAN PLASMA GLUCOSE: 306.31 mg/dL

## 2017-03-23 LAB — PHOSPHORUS
PHOSPHORUS: 3.5 mg/dL (ref 2.5–4.6)
PHOSPHORUS: 3.8 mg/dL (ref 2.5–4.6)
PHOSPHORUS: 4 mg/dL (ref 2.5–4.6)
PHOSPHORUS: 4.3 mg/dL (ref 2.5–4.6)
Phosphorus: 4.1 mg/dL (ref 2.5–4.6)
Phosphorus: 3.8 mg/dL (ref 2.5–4.6)

## 2017-03-23 LAB — SODIUM, URINE, RANDOM: SODIUM UR: 87 mmol/L

## 2017-03-23 LAB — CREATININE, URINE, RANDOM: Creatinine, Urine: 104 mg/dL

## 2017-03-23 LAB — PROCALCITONIN: PROCALCITONIN: 0.45 ng/mL

## 2017-03-23 LAB — APTT: aPTT: 28 seconds (ref 24–36)

## 2017-03-23 MED ORDER — GUAIFENESIN 100 MG/5ML PO SOLN
5.0000 mL | ORAL | Status: DC | PRN
  Administered 2017-03-24: 100 mg via ORAL
  Filled 2017-03-23 (×2): qty 5

## 2017-03-23 NOTE — Consult Note (Signed)
Reason for Consult:AMS and elevated procalcitonin Referring Physician: Humphrey Rolls  CC: AMS  HPI: Laurie Lin is an 61 y.o. female who is unable to provide much history.  All history obtained from husband.  Patient with a history of ETOH abuse and diabetes.  Has not been taking her insulin for the past week.  Over the past few days has been becoming progressively weaker.  On the day prior to presentation became confused.  On the night of admission got up to go to the bathroom 7 times.  With the last time could not make it due to weakness.  Had to be lowered to the floor.  Speech became slurred and difficult to understand.  Patient became event more confused.  EMS was called. Pateint admitted in DKA.    Past Medical History:  Diagnosis Date  . Diabetes mellitus without complication ([place])   . GERD (gastroesophageal reflux disease)   . Hypertension   . Renal disorder    stage 3     Past Surgical History:  Procedure Laterality Date  . EYE SURGERY     bilateral caterac    Family History  Problem Relation Age of Onset  . Diabetes Mother     Social History:  reports that  has never smoked. she has never used smokeless tobacco. She reports that she drinks alcohol. She reports that she does not use drugs.  No Known Allergies  Medications:  I have reviewed the patient's current medications. Prior to Admission:  Medications Prior to Admission  Medication Sig Dispense Refill Last Dose  . esomeprazole (NEXIUM) 40 MG capsule Take 40 mg by mouth 2 (two) times daily.  1 Past Week at Unknown time  . furosemide (LASIX) 20 MG tablet Take 20 mg by mouth daily.  2 Past Week at Unknown time  . metoprolol tartrate (LOPRESSOR) 25 MG tablet Take 25 mg by mouth 2 (two) times daily.  0 Past Week at Unknown time   Scheduled: . folic acid  1 mg Oral Daily  . insulin aspart  2-6 Units Subcutaneous Q4H  . insulin glargine  40 Units Subcutaneous Q24H  . multivitamin with minerals  1 tablet Oral Daily  .  pantoprazole (PROTONIX) IV  40 mg Intravenous Q12H  . thiamine  100 mg Oral Daily   Or  . thiamine  100 mg Intravenous Daily    ROS: History obtained from the patient and husband  General ROS: negative for - chills, fatigue, fever, night sweats, weight gain or weight loss Psychological ROS: confusion Ophthalmic ROS: negative for - blurry vision, double vision, eye pain or loss of vision ENT ROS: negative for - epistaxis, nasal discharge, oral lesions, sore throat, tinnitus or vertigo Allergy and Immunology ROS: negative for - hives or itchy/watery eyes Hematological and Lymphatic ROS: negative for - bleeding problems, bruising or swollen lymph nodes Endocrine ROS: polydipsia/polyuria  Respiratory ROS: negative for - cough, hemoptysis, shortness of breath or wheezing Cardiovascular ROS: LE edema  Gastrointestinal ROS: nausea/vomiting Genito-Urinary ROS: urinary frequency Musculoskeletal ROS: BLE pain Neurological ROS: as noted in HPI Dermatological ROS: negative for rash and skin lesion changes  Physical Examination: Blood pressure 116/80, pulse 100, temperature 98.4 F (36.9 C), resp. rate (!) 23, height 5\' 10"  (1.778 m), weight 77.8 kg (171 lb 8.3 oz), SpO2 98 %.  HEENT-  Normocephalic, no lesions, without obvious abnormality.  Normal external eye and conjunctiva.  Normal TM's bilaterally.  Normal auditory canals and external ears. Normal external nose, mucus membranes and  septum.  Normal pharynx. Cardiovascular- S1, S2 normal, pulses palpable throughout   Lungs- chest clear, no wheezing, rales, normal symmetric air entry Abdomen- soft, non-tender; bowel sounds normal; no masses,  no organomegaly Extremities- BLE edema Lymph-no adenopathy palpable Musculoskeletal-extremity pain to palpation Skin-warm and dry, no hyperpigmentation, vitiligo, or suspicious lesions  Neurological Examination   Mental Status: Alert, oriented to name.  Needs prompts to say where she is, the year or  the date.  Does know her husband.  Speech fluent without evidence of aphasia.  Able to follow simple commands without difficulty but requires some reinforcement for 3-step commands. Cranial Nerves: II: Discs flat bilaterally; Visual fields grossly normal, pupils equal, round, reactive to light and accommodation III,IV, VI: ptosis not present, extra-ocular motions intact bilaterally V,VII: smile symmetric, facial light touch sensation normal bilaterally VIII: hearing normal bilaterally IX,X: gag reflex present XI: bilateral shoulder shrug XII: midline tongue extension Motor: Right : Upper extremity   5/5    Left:     Upper extremity   5/5  Lower extremity   4+/5 proximally with full strength distally    Lower extremity   4+/5 with full strength distally Tone and bulk:normal tone throughout; no atrophy noted Sensory: Pinprick and light touch intact throughout, bilaterally Deep Tendon Reflexes: 2+ in the upper extremities and absent in the lower extremities Plantars: Right: mute   Left: mute Cerebellar: Finger-to-nose testing with significant tremor bilaterally but no dysmetria.  Unable to perform heel-to-shin testing due to proximal weakness Gait: not tested due to safety concerns    Laboratory Studies:   Basic Metabolic Panel: Recent Labs  Lab 03/22/17 0418 03/22/17 0802 03/22/17 1218 03/22/17 1547 03/22/17 1951 03/23/17 0002 03/23/17 0426 03/23/17 0957  NA 136 137 138 137 136 137 137 138  K 3.3* 3.6 3.5 3.4* 3.5 3.6 3.6 3.8  CL 103 104 103 105 105 106 107 106  CO2 23 22 22  20* 19* 20* 20* 19*  GLUCOSE 162* 139* 86 107* 137* 122* 111* 159*  BUN 29* 29* 28* 28* 27* 27* 26* 27*  CREATININE 2.61* 2.87* 2.90* 2.88* 3.30* 3.26* 3.23* 3.41*  CALCIUM 8.3* 8.3* 8.0* 7.8* 7.5* 7.6* 7.4* 7.4*  MG 2.0 1.9 1.8 1.7 1.7  --   --   --   PHOS 1.7* 3.5 3.9 3.8 3.7 3.8 4.0 4.3    Liver Function Tests: Recent Labs  Lab 03/21/17 1033  AST 13*  ALT 8*  ALKPHOS 55  BILITOT 2.2*  PROT  7.4  ALBUMIN 3.1*   No results for input(s): LIPASE, AMYLASE in the last 168 hours. Recent Labs  Lab 03/21/17 1322 03/22/17 1218  AMMONIA 28 20    CBC: Recent Labs  Lab 03/21/17 1033 03/21/17 1508 03/22/17 0418 03/22/17 1218 03/23/17 0426  WBC 11.5* 8.4 8.5  --  9.4  NEUTROABS 10.0*  --   --   --   --   HGB 10.1* 9.0* 7.9* 8.3* 8.1*  HCT 33.3* 28.4* 23.6* 24.5* 24.0*  MCV 109.1* 104.2* 96.2  --  96.6  PLT 284 240 228  --  198    Cardiac Enzymes: Recent Labs  Lab 03/21/17 1033  TROPONINI <0.03    BNP: Invalid input(s): POCBNP  CBG: Recent Labs  Lab 03/22/17 2013 03/23/17 0017 03/23/17 0414 03/23/17 0828 03/23/17 1136  GLUCAP 126* 108* 100* 115* 173*    Microbiology: Results for orders placed or performed during the hospital encounter of 03/21/17  Blood Culture (routine x 2)  Status: None (Preliminary result)   Collection Time: 03/21/17 10:33 AM  Result Value Ref Range Status   Specimen Description BLOOD Blood Culture adequate volume  Final   Special Requests   Final    BOTTLES DRAWN AEROBIC AND ANAEROBIC RIGHT ANTECUBITAL   Culture NO GROWTH 2 DAYS  Final   Report Status PENDING  Incomplete  Blood Culture (routine x 2)     Status: None (Preliminary result)   Collection Time: 03/21/17 11:11 AM  Result Value Ref Range Status   Specimen Description BLOOD Blood Culture adequate volume  Final   Special Requests   Final    BOTTLES DRAWN AEROBIC AND ANAEROBIC BLOOD RIGHT HAND   Culture NO GROWTH 2 DAYS  Final   Report Status PENDING  Incomplete  MRSA PCR Screening     Status: None   Collection Time: 03/21/17  2:45 PM  Result Value Ref Range Status   MRSA by PCR NEGATIVE NEGATIVE Final    Comment:        The GeneXpert MRSA Assay (FDA approved for NASAL specimens only), is one component of a comprehensive MRSA colonization surveillance program. It is not intended to diagnose MRSA infection nor to guide or monitor treatment for MRSA infections.    Urine Culture     Status: None   Collection Time: 03/21/17  3:51 PM  Result Value Ref Range Status   Specimen Description URINE, RANDOM  Final   Special Requests NONE  Final   Culture   Final    NO GROWTH Performed at [facility], [address], [place], [place] [address]    Report Status 03/22/2017 FINAL  Final    Coagulation Studies: No results for input(s): LABPROT, INR in the last 72 hours.  Urinalysis:  Recent Labs  Lab 03/21/17 1106  COLORURINE YELLOW*  LABSPEC 1.014  PHURINE 5.0  GLUCOSEU >=500*  HGBUR MODERATE*  BILIRUBINUR NEGATIVE  KETONESUR 20*  PROTEINUR 100*  NITRITE NEGATIVE  LEUKOCYTESUR SMALL*    Lipid Panel:  No results found for: CHOL, TRIG, HDL, CHOLHDL, VLDL, LDLCALC  HgbA1C: No results found for: HGBA1C  Urine Drug Screen:  No results found for: LABOPIA, COCAINSCRNUR, LABBENZ, AMPHETMU, THCU, LABBARB  Alcohol Level:  Recent Labs  Lab 03/21/17 [place] <10     Imaging: US Abdomen Complete  Result Date: 03/22/2017 CLINICAL DATA:  Cirrhosis. EXAM: ABDOMEN ULTRASOUND COMPLETE COMPARISON:  None. FINDINGS: Gallbladder: No gallstones or wall thickening visualized. No sonographic Murphy sign noted by sonographer. Common bile duct: Diameter: 2 mm Liver: Diffuse increased echogenicity with no focal mass. Portal vein is patent on color Doppler imaging with normal direction of blood flow towards the liver. IVC: No abnormality visualized. Pancreas: Visualized portion unremarkable. Spleen: Size and appearance within normal limits. Right Kidney: Length: 11 cm. Echogenicity within normal limits. No mass or hydronephrosis visualized. Left Kidney: Length: 10.2 cm. Echogenicity within normal limits. No mass or hydronephrosis visualized. Abdominal aorta: No aneurysm visualized. Other findings: None. IMPRESSION: 1. Diffuse increased echogenicity in the liver without focal mass is nonspecific but could be due to the patient's reported cirrhosis or hepatic  steatosis. Normal directional blood flow in the portal vein. No splenomegaly. Electronically Signed   By: Dorise Bullion III M.D   On: 03/22/2017 15:21     Assessment/Plan: 62 year old female presenting with AMS, slurred speech and weakness.  Noted to be in DKA.  DKA being addressed and per husband patient is improving daily in both speech and  mental status.  Continues to have some lower extremity weakness.  Does have a significant PN felt to be secondary to DM but likely influenced by ETOH abuse as well.  Consult called for LP due to patient having elevated procalcitonin with no known source of infection.  With improving mental status, it is unlikely that CNS infection is the cause.  Will defer LP at this time.  Do suspect some degree of withdrawal.    Recommendations: 1.  Head CT without contrast 2.  PT/INR, PTT 3.  Agree with thiamine and folate supplementation.   4.  CIWA  Will continue to follow with you  Alexis Goodell, MD Neurology [phone] 03/23/2017, 12:41 PM

## 2017-03-23 NOTE — Consult Note (Signed)
Date: 03/23/2017                  Patient Name:  Laurie Lin  MRN: [id number]  DOB: [birth year]  Age / Sex: 61 y.o., female         PCP: Patient, No Pcp Per                 Service Requesting Consult: ARF/ Henreitta Leber, MD                 Reason for Consult:  Acute renal failure            History of Present Illness: Patient is a 61 y.o. female with medical problems of diabetes type 1 diagnosed March 7062, complicated by neuropathy and retinopathy, depression, alcohol abuse, who was admitted to [facility] on 03/21/2017 for evaluation of confusion.  Patient was found to have severe hyperglycemia, severe acidosis and acute renal failure.  She was admitted for further evaluation and management. Patient was followed  at [facility] by Dr Alger Simons for Chronic kidney disease but patient has not been back for follow-up for close to a year Her creatinine was 1.1 in July 2017 Patient's husband reports that she had vomiting on Friday night.  Saturday morning she was so weak that she could not get out of bed and is slid down.  He reports that she has been depressed.  Admission creatinine was 2.71.  Phosphorus less than 1 It has progressively increased to 3.40 Potassium is normal at 3.8 Patient is now stable and is being transferred to regular floor Her hospital course was complicated by confusion at admission  Medications: Outpatient medications: Medications Prior to Admission  Medication Sig Dispense Refill Last Dose  . esomeprazole (NEXIUM) 40 MG capsule Take 40 mg by mouth 2 (two) times daily.  1 Past Week at Unknown time  . furosemide (LASIX) 20 MG tablet Take 20 mg by mouth daily.  2 Past Week at Unknown time  . metoprolol tartrate (LOPRESSOR) 25 MG tablet Take 25 mg by mouth 2 (two) times daily.  0 Past Week at Unknown time    Current medications: Current Facility-Administered Medications  Medication Dose Route Frequency Provider Last Rate Last Dose  . 0.9 %  sodium chloride infusion    Intravenous Continuous Dustin Flock, MD 125 mL/hr at 03/23/17 0727    . folic acid (FOLVITE) tablet 1 mg  1 mg Oral Daily Dustin Flock, MD   1 mg at 03/23/17 1054  . insulin aspart (novoLOG) injection 2-6 Units  2-6 Units Subcutaneous Q4H Dorene Sorrow S, NP   4 Units at 03/23/17 1142  . insulin glargine (LANTUS) injection 40 Units  40 Units Subcutaneous Q24H Mikael Spray, NP   40 Units at 03/23/17 3762  . LORazepam (ATIVAN) tablet 1 mg  1 mg Oral Q6H PRN Dustin Flock, MD       Or  . LORazepam (ATIVAN) injection 1 mg  1 mg Intravenous Q6H PRN Dustin Flock, MD   1 mg at 03/22/17 2349  . multivitamin with minerals tablet 1 tablet  1 tablet Oral Daily Dustin Flock, MD   1 tablet at 03/23/17 1053  . ondansetron (ZOFRAN) injection 4 mg  4 mg Intravenous Q6H PRN Nettie Elm, MD   4 mg at 03/23/17 1137  . pantoprazole (PROTONIX) injection 40 mg  40 mg Intravenous Q12H Dustin Flock, MD   40 mg at 03/23/17 1054  . thiamine (VITAMIN B-1) tablet 100 mg  100 mg Oral Daily Dustin Flock, MD   100 mg at 03/23/17 1054   Or  . thiamine (B-1) injection 100 mg  100 mg Intravenous Daily Dustin Flock, MD          Allergies: No Known Allergies    Past Medical History: Past Medical History:  Diagnosis Date  . Diabetes mellitus without complication ([place])   . GERD (gastroesophageal reflux disease)   . Hypertension   . Renal disorder    stage 3      Past Surgical History: Past Surgical History:  Procedure Laterality Date  . EYE SURGERY     bilateral caterac     Family History: Family History  Problem Relation Age of Onset  . Diabetes Mother      Social History: Social History   Socioeconomic History  . Marital status: Married    Spouse name: Not on file  . Number of children: Not on file  . Years of education: Not on file  . Highest education level: Not on file  Social Needs  . Financial resource strain: Not on file  . Food insecurity - worry: Not on  file  . Food insecurity - inability: Not on file  . Transportation needs - medical: Not on file  . Transportation needs - non-medical: Not on file  Occupational History  . Not on file  Tobacco Use  . Smoking status: Never Smoker  . Smokeless tobacco: Never Used  Substance and Sexual Activity  . Alcohol use: Yes    Comment: heavy drinker  . Drug use: No  . Sexual activity: No  Other Topics Concern  . Not on file  Social History Narrative  . 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  Poorly controlled diabetes.  Vital Signs: Blood pressure 116/80, pulse 100, temperature 98.4 F (36.9 C), resp. rate (!) 23, height 5\' 10"  (1.778 m), weight 77.8 kg (171 lb 8.3 oz), SpO2 98 %.   Intake/Output Summary (Last 24 hours) at 03/23/2017 1259 Last data filed at 03/23/2017 1100 Gross per 24 hour  Intake 2100 ml  Output 290 ml  Net 1810 ml    Weight trends: Filed Weights   03/21/17 1104 03/21/17 1337  Weight: 79.4 kg (175 lb) 77.8 kg (171 lb 8.3 oz)    Physical Exam: General:  No acute distress, laying in the bed  HEENT  mouth is dry, anicteric,   Neck:  supple, no masses  Lungs:  Mild scattered rhonchi  Heart::  Tachycardic, regular, soft systolic murmur  Abdomen:  Soft, nontender  Extremities:  Trace to 1+ pitting edema  Neurologic:  Alert, oriented  Skin:  No acute rashes             Lab results: Basic Metabolic Panel: Recent Labs  Lab 03/22/17 1218 03/22/17 1547 03/22/17 1951 03/23/17 0002 03/23/17 0426 03/23/17 0957   NA 138 137 136 137 137 138  K 3.5 3.4* 3.5 3.6 3.6 3.8  CL 103 105 105 106 107 106  CO2 22 20* 19* 20* 20* 19*  GLUCOSE 86 107* 137* 122* 111* 159*  BUN 28* 28* 27* 27* 26* 27*  CREATININE 2.90* 2.88* 3.30* 3.26* 3.23* 3.41*  CALCIUM 8.0* 7.8* 7.5* 7.6* 7.4* 7.4*  MG 1.8 1.7 1.7  --   --   --   PHOS 3.9 3.8 3.7 3.8 4.0 4.3    Liver Function Tests: Recent Labs  Lab 03/21/17 1033  AST 13*  ALT 8*  ALKPHOS 55  BILITOT 2.2*  PROT 7.4  ALBUMIN 3.1*   No results for input(s): LIPASE, AMYLASE in the last 168 hours. Recent Labs  Lab 03/22/17 1218  AMMONIA 20    CBC: Recent Labs  Lab 03/21/17 1033  03/22/17 0418 03/22/17 1218 03/23/17 0426  WBC 11.5*   < > 8.5  --  9.4  NEUTROABS 10.0*  --   --   --   --   HGB 10.1*   < > 7.9* 8.3* 8.1*  HCT 33.3*   < > 23.6* 24.5* 24.0*  MCV 109.1*   < > 96.2  --  96.6  PLT 284   < > 228  --  198   < > = values in this interval not displayed.    Cardiac Enzymes: Recent Labs  Lab 03/21/17 1033  TROPONINI <0.03    BNP: Invalid input(s): POCBNP  CBG: Recent Labs  Lab 03/22/17 2013 03/23/17 0017 03/23/17 0414 03/23/17 0828 03/23/17 1136  GLUCAP 126* 108* 100* 115* 173*    Microbiology: Recent Results (from the past 720 hour(s))  Blood Culture (routine x 2)     Status: None (Preliminary result)   Collection Time: 03/21/17 10:33 AM  Result Value Ref Range Status   Specimen Description BLOOD Blood Culture adequate volume  Final   Special Requests   Final    BOTTLES DRAWN AEROBIC AND ANAEROBIC RIGHT ANTECUBITAL   Culture NO GROWTH 2 DAYS  Final   Report Status PENDING  Incomplete  Blood Culture (routine x 2)     Status: None (Preliminary result)   Collection Time: 03/21/17 11:11 AM  Result Value Ref Range Status   Specimen Description BLOOD Blood Culture adequate volume  Final   Special Requests   Final    BOTTLES DRAWN AEROBIC AND ANAEROBIC BLOOD RIGHT HAND   Culture NO GROWTH 2 DAYS  Final   Report Status  PENDING  Incomplete  MRSA PCR Screening     Status: None   Collection Time: 03/21/17  2:45 PM  Result Value Ref Range Status   MRSA by PCR NEGATIVE NEGATIVE Final    Comment:        The GeneXpert MRSA Assay (FDA approved for NASAL specimens only), is one component of a comprehensive MRSA colonization surveillance program. It is not intended to diagnose MRSA infection nor to guide or monitor treatment for MRSA infections.   Urine Culture     Status: None   Collection Time: 03/21/17  3:51 PM  Result Value Ref Range Status   Specimen Description URINE, RANDOM  Final   Special Requests NONE  Final   Culture   Final    NO GROWTH Performed at [facility], [address], [place], [place] [address]    Report Status 03/22/2017 FINAL  Final     Coagulation Studies: No results for input(s): LABPROT, INR in the last 72 hours.  Urinalysis: Recent Labs    03/21/17 1106  COLORURINE YELLOW*  LABSPEC 1.014  PHURINE 5.0  GLUCOSEU >=500*  HGBUR MODERATE*  BILIRUBINUR NEGATIVE  KETONESUR 20*  PROTEINUR 100*  NITRITE NEGATIVE  LEUKOCYTESUR SMALL*        Imaging: US Abdomen Complete  Result Date: 03/22/2017 CLINICAL DATA:  Cirrhosis. EXAM: ABDOMEN ULTRASOUND COMPLETE COMPARISON:  None. FINDINGS: Gallbladder: No gallstones or wall thickening visualized. No sonographic Murphy sign noted by sonographer. Common bile duct: Diameter: 2 mm Liver: Diffuse increased echogenicity with no focal mass. Portal vein is patent on color Doppler imaging with normal direction of blood flow towards the liver. IVC: No abnormality visualized. Pancreas: Visualized portion unremarkable. Spleen: Size and appearance within normal limits. Right Kidney: Length: 11 cm. Echogenicity within normal limits. No mass or hydronephrosis visualized. Left Kidney: Length: 10.2 cm. Echogenicity within normal limits. No mass or hydronephrosis visualized. Abdominal aorta: No aneurysm visualized. Other findings: None.  IMPRESSION: 1. Diffuse increased echogenicity in the liver without focal mass is nonspecific but could be due to the patient's reported cirrhosis or hepatic steatosis. Normal directional blood flow in the portal vein. No splenomegaly. Electronically Signed   By: Dorise Bullion III M.D   On: 03/22/2017 15:21      Assessment & Plan: Pt is a 61 y.o. 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   1.  Acute renal failure Baseline creatinine 1.1 from July 2017 Admission creatinine is 2.58 on December 1.  Since admission, creatinine continues to increase and today's level has increased to 3.40. Acute renal failure is likely multifactorial from volume depletion, DKA.  Possibility of interstitial nephritis. Electrolytes and volume status are acceptable.  No acute indication for dialysis at present  2.  Chronic kidney disease stage III Baseline creatinine 1.1 from July 2017/GFR 50 Renal ultrasound shows right kidney 11 cm, left kidney 10.2 cm, no hydronephrosis or mass  3.  Diabetes type 1 with chronic kidney disease Diabetes is poorly controlled Patient used to see Dr. Frederik Pear at [place] known hemoglobin A1c is 9.9% from July 2017 Repeat HbA1c ordered  4.  Proteinuria Likely related to diabetic nephropathy We will obtain a urine protein to creatinine ratio

## 2017-03-23 NOTE — Progress Notes (Signed)
Inpatient Diabetes Program Recommendations  AACE/ADA: New Consensus Statement on Inpatient Glycemic Control (2015)  Target Ranges:  Prepandial:   less than 140 mg/dL      Peak postprandial:   less than 180 mg/dL (1-2 hours)      Critically ill patients:  140 - 180 mg/dL   Results for ROSALAND, SHIFFMAN (MRN [id number]) as of 03/23/2017 16:33  Ref. Range 03/23/2017 00:17 03/23/2017 04:14 03/23/2017 08:28 03/23/2017 11:36 03/23/2017 15:43  Glucose-Capillary Latest Ref Range: 65 - 99 mg/dL 108 (H) 100 (H) 115 (H) 173 (H) 86  Results for ZELLIE, JENNING (MRN [id number]) as of 03/23/2017 16:33  Ref. Range 03/22/2017 08:17 03/22/2017 09:19 03/22/2017 10:32 03/22/2017 11:31 03/22/2017 12:39 03/22/2017 15:49 03/22/2017 20:13  Glucose-Capillary Latest Ref Range: 65 - 99 mg/dL 148 (H) 133 (H) 124 (H) 99 83 104 (H) 126 (H)   Review of Glycemic Control  Diabetes history: DM1 Outpatient Diabetes medications: Lantus and Novolog Current orders for Inpatient glycemic control: Lantus 40 units Q24H, Novolog 2-6 units Q4H  Inpatient Diabetes Program Recommendations:  Insulin-Correction: Please consider discontinuing ICU Glycemic Control order set and use regular Glycemic Control order set to order CBGs and Novolog 0-9 units TID with meals and Novolog 0-5 units QHS.  Note: Spoke with patient and her husband regarding DM control. Patient showed no expression (flat affect) and was very slow to respond to questions and therefore, her husband answered most questions asked.  Patient has Lantus, Basaglar, and Novolog insulin at home. Inquired about why she has Lantus and Basaglar since they are similar insulins. Patient reports that her long acting insulin was changed so she still has both at home. Inquired about why she has not taken insulin lately. Patient's husband reports that patient has only been taking Novolog insulin lately because "she said she was not eating much of anything and she didn't need to take the other insulin."   Inquired about glucose trends lately. Patient's husband states that patient has not been checking her glucose lately either but noted it was over 500 mg/dl at home before she was brought to the hospital. Discussed sick day guidelines for a person with DM1. Explained how both insulins work to keep DM control. Explained that even when she was not eating well she would still need to be taking basal insulin and that she needed to discuss with her Endocrinologist about any adjustments needed with insulin dosages if she was not eating much. Patient's husband indicated that patient is an alcoholic and she drinks rather than eat. Patient does not engage in conversation much and does not appear interested in discussion. Will plan to see patient again tomorrow to discuss importance of DM control and consistently taking insulin.  Thanks, Barnie Alderman, RN, MSN, CDE Diabetes Coordinator Inpatient Diabetes Program [phone] (Team Pager from 8am to 5pm)

## 2017-03-23 NOTE — Progress Notes (Signed)
[facility] Consultation     Date: 03/23/2017,   MRN# [id number] CARYLON TAMBURRO Mar 26, 1956   SUBJECTIVE:   No acute issues overnight. DKA resolved. Patient confused but mentation better today compared to yesterday.  No active complaints. Hemoglobin stable.  No overt bleed. Procalcitonin mildly elevated but trending down   MEDICATIONS    Home Medication:    Current Medication:   Current Facility-Administered Medications:  .  0.9 %  sodium chloride infusion, , Intravenous, Continuous, Dustin Flock, MD, Last Rate: 125 mL/hr at 03/23/17 0727 .  folic acid (FOLVITE) tablet 1 mg, 1 mg, Oral, Daily, Dustin Flock, MD, 1 mg at 03/23/17 1054 .  insulin aspart (novoLOG) injection 2-6 Units, 2-6 Units, Subcutaneous, Q4H, Tukov, Magadalene S, NP, 4 Units at 03/23/17 1142 .  insulin glargine (LANTUS) injection 40 Units, 40 Units, Subcutaneous, Q24H, Tukov, Magadalene S, NP, 40 Units at 03/23/17 0838 .  LORazepam (ATIVAN) tablet 1 mg, 1 mg, Oral, Q6H PRN **OR** LORazepam (ATIVAN) injection 1 mg, 1 mg, Intravenous, Q6H PRN, Dustin Flock, MD, 1 mg at 03/22/17 2349 .  multivitamin with minerals tablet 1 tablet, 1 tablet, Oral, Daily, Dustin Flock, MD, 1 tablet at 03/23/17 1053 .  ondansetron (ZOFRAN) injection 4 mg, 4 mg, Intravenous, Q6H PRN, Nettie Elm, MD, 4 mg at 03/23/17 1137 .  pantoprazole (PROTONIX) injection 40 mg, 40 mg, Intravenous, Q12H, Dustin Flock, MD, 40 mg at 03/23/17 1054 .  thiamine (VITAMIN B-1) tablet 100 mg, 100 mg, Oral, Daily, 100 mg at 03/23/17 1054 **OR** thiamine (B-1) injection 100 mg, 100 mg, Intravenous, Daily, Dustin Flock, MD    VS: BP 116/80   Pulse 100   Temp 98.4 F (36.9 C)   Resp (!) 23   Ht 5\' 10"  (1.778 m)   Wt 171 lb 8.3 oz (77.8 kg)   LMP  (LMP Unknown)   SpO2 98%   BMI 24.61 kg/m      PHYSICAL EXAM    Awake, mildly confused.  Following commands. Sclera anicteric. CVS S1, S2, 0.  No murmurs Chest  is clear to auscultation bilaterally with no rales rhonchi or wheezes. Abdomen is soft, nontender, nondistended. No lower extremity edema bilaterally.    LABS    Recent Labs    03/21/17 1033 03/21/17 1508  03/22/17 0418  03/22/17 1218  03/22/17 1951 03/23/17 0002 03/23/17 0426 03/23/17 0957  HGB 10.1* 9.0*  --  7.9*  --  8.3*  --   --   --  8.1*  --   HCT 33.3* 28.4*  --  23.6*  --  24.5*  --   --   --  24.0*  --   MCV 109.1* 104.2*  --  96.2  --   --   --   --   --  96.6  --   WBC 11.5* 8.4  --  8.5  --   --   --   --   --  9.4  --   BUN 27* 29*   < > 29*   < > 28*   < > 27* 27* 26* 27*  CREATININE 2.58* 2.71*   < > 2.61*   < > 2.90*   < > 3.30* 3.26* 3.23* 3.41*  GLUCOSE 620* 570*   < > 162*   < > 86   < > 137* 122* 111* 159*  CALCIUM 9.1 8.2*   < > 8.3*   < > 8.0*   < > 7.5* 7.6* 7.4*  7.4*   < > = values in this interval not displayed.  ,    No results for input(s): PH in the last 72 hours.  Invalid input(s): PCO2, PO2, BASEEXCESS, BASEDEFICITE, TFT    CULTURE RESULTS   Recent Results (from the past 240 hour(s))  Blood Culture (routine x 2)     Status: None (Preliminary result)   Collection Time: 03/21/17 10:33 AM  Result Value Ref Range Status   Specimen Description BLOOD Blood Culture adequate volume  Final   Special Requests   Final    BOTTLES DRAWN AEROBIC AND ANAEROBIC RIGHT ANTECUBITAL   Culture NO GROWTH 2 DAYS  Final   Report Status PENDING  Incomplete  Blood Culture (routine x 2)     Status: None (Preliminary result)   Collection Time: 03/21/17 11:11 AM  Result Value Ref Range Status   Specimen Description BLOOD Blood Culture adequate volume  Final   Special Requests   Final    BOTTLES DRAWN AEROBIC AND ANAEROBIC BLOOD RIGHT HAND   Culture NO GROWTH 2 DAYS  Final   Report Status PENDING  Incomplete  MRSA PCR Screening     Status: None   Collection Time: 03/21/17  2:45 PM  Result Value Ref Range Status   MRSA by PCR NEGATIVE NEGATIVE Final     Comment:        The GeneXpert MRSA Assay (FDA approved for NASAL specimens only), is one component of a comprehensive MRSA colonization surveillance program. It is not intended to diagnose MRSA infection nor to guide or monitor treatment for MRSA infections.   Urine Culture     Status: None   Collection Time: 03/21/17  3:51 PM  Result Value Ref Range Status   Specimen Description URINE, RANDOM  Final   Special Requests NONE  Final   Culture   Final    NO GROWTH Performed at [facility], [address], [place], [place] [address]    Report Status 03/22/2017 FINAL  Final          IMAGING    US Abdomen Complete  Result Date: 03/22/2017 CLINICAL DATA:  Cirrhosis. EXAM: ABDOMEN ULTRASOUND COMPLETE COMPARISON:  None. FINDINGS: Gallbladder: No gallstones or wall thickening visualized. No sonographic Murphy sign noted by sonographer. Common bile duct: Diameter: 2 mm Liver: Diffuse increased echogenicity with no focal mass. Portal vein is patent on color Doppler imaging with normal direction of blood flow towards the liver. IVC: No abnormality visualized. Pancreas: Visualized portion unremarkable. Spleen: Size and appearance within normal limits. Right Kidney: Length: 11 cm. Echogenicity within normal limits. No mass or hydronephrosis visualized. Left Kidney: Length: 10.2 cm. Echogenicity within normal limits. No mass or hydronephrosis visualized. Abdominal aorta: No aneurysm visualized. Other findings: None. IMPRESSION: 1. Diffuse increased echogenicity in the liver without focal mass is nonspecific but could be due to the patient's reported cirrhosis or hepatic steatosis. Normal directional blood flow in the portal vein. No splenomegaly. Electronically Signed   By: Dorise Bullion III M.D   On: 03/22/2017 15:21         ASSESSMENT/PLAN   61 years old lady with a past medical history significant for hypertension, diabetes, CKD stage III, GERD ongoing alcohol abuse who was  brought to the hospital by her husband with a history of confusion for the past 1 day as well as polyuria since last night and was found to be in DKA with severe metabolic acidosis  Problem list:  DKA - resolved Severe  metabolic acidosis - resolved Acute kidney injury. ?  Acute versus chronic in a patient with history of CKD stage III Encephalopathy - likely early DTs GERD Hypertension Alcohol abuse Possible depression Anemia - ?  Baseline hemoglobin Elevated bilirubin levels Hypotension on presentation - resolved Hypothermia on presentation - resolved   Continues to be confused the mentation is slightly better today. Pro calcitonin was elevated.  Given the confusion and mildly elevated pro-calcitonin in the absence of any other source of infection, have consulted neurology for lumbar puncture and further evaluation. We will defer to their expertise  DKA resolved. Patient transitioned to subcu insulin (lantus and SS). Continue monitoring blood sugar levels.  Continue CIWA protocol for alcohol abuse.  CIWA is 2 this morning Cnotinue thiamine and folic acid supplementation  Patient's baseline renal function is unknown.  She does have a known history of CKD stage III. U/O low yesterday. IVF bolus given followed by increasing IVF rate. Cr bump noted. No hydronephrosis on abd [place] yesterday. Will send urine electrolytes for FeNa calculation as well as urine eosinophils. Nephrology consultation requested.  PPI for GERD  Anemia noted. Unclear what her baseline is. She may have chronic anemia given her alcohol abuse. Hgb is stable. Monitor daily hemoglobins. Transfuse for hemoglobin less than 7 Enoxaparin stopped given her progressive anemia. No overt bleeding seen though. Check stool for heme occult. Hemolysis unlikely given low retic count and normal LDH.  Proclacitonin elevated but the patient is afebrile and does not have leukocytosis. Trend procalcitonin  tomorrow.  Bilirubin levels elevated.  Monitor daily. Abd [place]  Psych consult for depression when the patient's mentation improves.  SCD for DVT prophylaxis.  Consider starting chemical DVT prophylaxis once hemoglobin remained stable for 48 hours.  Discussed with her husband.  Transfer to the floor with telemetry sitter   Nettie Elm, M.D.  Pulmonary and Critical Care Medicine

## 2017-03-23 NOTE — Progress Notes (Signed)
[facility] [place] NAME: Laurie Lin    MR#:  [id number]  DATE OF BIRTH:  27-Jul-1955  SUBJECTIVE:   Mental status slowly improving. DKA has resolved, by mouth intake is improved. Patient's husband is at bedside. Ammonia level was normal yesterday and abdominal ultrasound showing just hepatic steatosis without evidence of cirrhosis.  REVIEW OF SYSTEMS:    Review of Systems  Constitutional: Negative for chills and fever.  HENT: Negative for congestion and tinnitus.   Eyes: Negative for blurred vision and double vision.  Respiratory: Negative for cough, shortness of breath and wheezing.   Cardiovascular: Negative for chest pain, orthopnea and PND.  Gastrointestinal: Negative for abdominal pain, diarrhea, nausea and vomiting.  Genitourinary: Negative for dysuria and hematuria.  Neurological: Negative for dizziness, sensory change and focal weakness.  All other systems reviewed and are negative.   Nutrition: Carb control Tolerating Diet: Yes Tolerating PT: Await Eval.    DRUG ALLERGIES:  No Known Allergies  VITALS:  Blood pressure 117/64, pulse 100, temperature 98.7 F (37.1 C), temperature source Oral, resp. rate 18, height 5\' 10"  (1.778 m), weight 77.8 kg (171 lb 8.3 oz), SpO2 96 %.  PHYSICAL EXAMINATION:   Physical Exam  GENERAL:  61 y.o.-year-old patient lying in bed lethargic but in NAD.  EYES: Pupils equal, round, reactive to light and accommodation. No scleral icterus. Extraocular muscles intact.  HEENT: Head atraumatic, normocephalic. Oropharynx and nasopharynx clear.  NECK:  Supple, no jugular venous distention. No thyroid enlargement, no tenderness.  LUNGS: Normal breath sounds bilaterally, no wheezing, rales, rhonchi. No use of accessory muscles of respiration.  CARDIOVASCULAR: S1, S2 normal. No murmurs, rubs, or gallops.  ABDOMEN: Soft, mild tenderness on palpation, no rebound, rigidity, nondistended. Bowel sounds  present. No organomegaly or mass.  EXTREMITIES: No cyanosis, clubbing or edema b/l.    NEUROLOGIC: Cranial nerves II through XII are intact. No focal Motor or sensory deficits b/l.  Globally weak PSYCHIATRIC: The patient is alert and oriented x 3.  SKIN: No obvious rash, lesion, or ulcer.    LABORATORY PANEL:   CBC Recent Labs  Lab 03/23/17 0426  WBC 9.4  HGB 8.1*  HCT 24.0*  PLT 198   ------------------------------------------------------------------------------------------------------------------  Chemistries  Recent Labs  Lab 03/21/17 1033  03/22/17 1951  03/23/17 1333  NA 132*   < > 136   < > 137  K 5.3*   < > 3.5   < > 3.8  CL 93*   < > 105   < > 105  CO2 <7*   < > 19*   < > 19*  GLUCOSE 620*   < > 137*   < > 154*  BUN 27*   < > 27*   < > 28*  CREATININE 2.58*   < > 3.30*   < > 3.40*  CALCIUM 9.1   < > 7.5*   < > 7.4*  MG  --    < > 1.7  --   --   AST 13*  --   --   --   --   ALT 8*  --   --   --   --   ALKPHOS 55  --   --   --   --   BILITOT 2.2*  --   --   --   --    < > = values in this interval not displayed.   ------------------------------------------------------------------------------------------------------------------  Cardiac Enzymes Recent Labs  Lab 03/21/17 1033  TROPONINI <0.03   ------------------------------------------------------------------------------------------------------------------  RADIOLOGY:  Ct Head Wo Contrast  Result Date: 03/23/2017 CLINICAL DATA:  Confusion, acute and unexplained. History of alcohol abuse. DKA with severe metabolic acidosis. EXAM: CT HEAD WITHOUT CONTRAST TECHNIQUE: Contiguous axial images were obtained from the base of the skull through the vertex without intravenous contrast. COMPARISON:  None available (report from 08/04/1997 was reviewed) FINDINGS: Brain: No evidence of acute infarction, hemorrhage, hydrocephalus, extra-axial collection or mass lesion/mass effect. Moderate atrophy. Chronic small vessel  ischemia with periventricular gliosis. Vascular: Arterial calcification.  No hyperdense vessel. Skull: No acute or aggressive finding Sinuses/Orbits: Bilateral cataract resection.  No acute finding. IMPRESSION: 1. No acute finding. 2. Chronic small vessel ischemia and moderate atrophy. Electronically Signed   By: Monte Fantasia M.D.   On: 03/23/2017 12:57   US Abdomen Complete  Result Date: 03/22/2017 CLINICAL DATA:  Cirrhosis. EXAM: ABDOMEN ULTRASOUND COMPLETE COMPARISON:  None. FINDINGS: Gallbladder: No gallstones or wall thickening visualized. No sonographic Murphy sign noted by sonographer. Common bile duct: Diameter: 2 mm Liver: Diffuse increased echogenicity with no focal mass. Portal vein is patent on color Doppler imaging with normal direction of blood flow towards the liver. IVC: No abnormality visualized. Pancreas: Visualized portion unremarkable. Spleen: Size and appearance within normal limits. Right Kidney: Length: 11 cm. Echogenicity within normal limits. No mass or hydronephrosis visualized. Left Kidney: Length: 10.2 cm. Echogenicity within normal limits. No mass or hydronephrosis visualized. Abdominal aorta: No aneurysm visualized. Other findings: None. IMPRESSION: 1. Diffuse increased echogenicity in the liver without focal mass is nonspecific but could be due to the patient's reported cirrhosis or hepatic steatosis. Normal directional blood flow in the portal vein. No splenomegaly. Electronically Signed   By: Dorise Bullion III M.D   On: 03/22/2017 15:21     ASSESSMENT AND PLAN:   61 year old female with past medical history of hypertension, GERD, diabetes, chronic kidney disease stage IV who presents to the hospital due to altered mental status and noted to be in acute diabetic ketoacidosis.  1. Acute diabetic ketoacidosis-suspect secondary to patient's noncompliance as she has not taken her insulin. -Much improved anion gap is closed, transitioned off the insulin drip and placed on  scheduled insulin. - cont. Lantus, Novolog insulin with meals.   - electrolytes stable.   2. Altered mental status-secondary to acute DKA, AKI and and ETOH withdrawal.  - improving and will cont. To monitor.  Ammonia level normal.     3. Alcohol abuse-continue CIWA protocol. - Cont. Thiamine, Folate.   4. Chronic kidney disease stage IV- Cr. A bit worse today from baseline. Cont. IV fluids and continue to follow renal function. Renal dose meds, avoid nephrotoxins. If creatinine not improving would consider a nephrology consult.  5. Anemia of chronic disease - hg. Stable.  Likely due to CKD and will cont. To monitor.  - no acute bleeding.   All the records are reviewed and case discussed with Care Management/Social Worker. Management plans discussed with the patient, family and they are in agreement.  CODE STATUS: Full code  DVT Prophylaxis: TEd's & SCD's.    TOTAL TIME TAKING CARE OF THIS PATIENT: 30 minutes.   POSSIBLE D/C IN 1-2 DAYS, DEPENDING ON CLINICAL CONDITION.   Henreitta Leber M.D on 03/23/2017 at 2:24 PM  Between 7am to 6pm - Pager - [phone]  After 6pm go to [email or web address] - Proofreader  [facility]  Office  [phone]  CC: Primary care physician; Patient,  No Pcp Per

## 2017-03-24 ENCOUNTER — Inpatient Hospital Stay: Payer: BLUE CROSS/BLUE SHIELD

## 2017-03-24 LAB — GLUCOSE, CAPILLARY
GLUCOSE-CAPILLARY: 107 mg/dL — AB (ref 65–99)
GLUCOSE-CAPILLARY: 189 mg/dL — AB (ref 65–99)
GLUCOSE-CAPILLARY: 71 mg/dL (ref 65–99)
GLUCOSE-CAPILLARY: 82 mg/dL (ref 65–99)
Glucose-Capillary: 31 mg/dL — CL (ref 65–99)
Glucose-Capillary: 45 mg/dL — ABNORMAL LOW (ref 65–99)
Glucose-Capillary: 49 mg/dL — ABNORMAL LOW (ref 65–99)
Glucose-Capillary: 128 mg/dL — ABNORMAL HIGH (ref 65–99)
Glucose-Capillary: 137 mg/dL — ABNORMAL HIGH (ref 65–99)

## 2017-03-24 LAB — EOSINOPHIL, URINE: Eosinophil, Urine: 3 %

## 2017-03-24 LAB — BASIC METABOLIC PANEL
Anion gap: 8 (ref 5–15)
BUN: 27 mg/dL — ABNORMAL HIGH (ref 6–20)
CALCIUM: 7.2 mg/dL — AB (ref 8.9–10.3)
CO2: 18 mmol/L — ABNORMAL LOW (ref 22–32)
CREATININE: 3.39 mg/dL — AB (ref 0.44–1.00)
Chloride: 107 mmol/L (ref 101–111)
GFR, EST AFRICAN AMERICAN: 16 mL/min — AB (ref >60)
GFR, EST NON AFRICAN AMERICAN: 14 mL/min — AB (ref >60)
Glucose, Bld: 38 mg/dL — CL (ref 65–99)
Potassium: 3.7 mmol/L (ref 3.5–5.1)
SODIUM: 133 mmol/L — AB (ref 135–145)

## 2017-03-24 LAB — PHOSPHORUS
Phosphorus: 3.3 mg/dL (ref 2.5–4.6)
Phosphorus: 3.3 mg/dL (ref 2.5–4.6)

## 2017-03-24 LAB — CBC
HCT: 23.2 % — ABNORMAL LOW (ref 35.0–47.0)
Hemoglobin: 7.8 g/dL — ABNORMAL LOW (ref 12.0–16.0)
MCH: 32.8 pg (ref 26.0–34.0)
MCHC: 33.6 g/dL (ref 32.0–36.0)
MCV: 97.6 fL (ref 80.0–100.0)
PLATELETS: 192 10*3/uL (ref 150–440)
RBC: 2.37 MIL/uL — ABNORMAL LOW (ref 3.80–5.20)
RDW: 15.2 % — ABNORMAL HIGH (ref 11.5–14.5)
WBC: 5.1 10*3/uL (ref 3.6–11.0)

## 2017-03-24 LAB — HAPTOGLOBIN: Haptoglobin: 153 mg/dL (ref 34–200)

## 2017-03-24 MED ORDER — INSULIN ASPART 100 UNIT/ML ~~LOC~~ SOLN: 0.0000 [IU] | Freq: Every day | SUBCUTANEOUS | Status: DC

## 2017-03-24 MED ORDER — INSULIN GLARGINE 100 UNIT/ML ~~LOC~~ SOLN
10.0000 [IU] | Freq: Every day | SUBCUTANEOUS | Status: DC
  Administered 2017-03-24 – 2017-03-25 (×2): 10 [IU] via SUBCUTANEOUS
  Filled 2017-03-24 (×4): qty 0.1

## 2017-03-24 MED ORDER — FLUCONAZOLE 100 MG PO TABS
150.0000 mg | ORAL_TABLET | Freq: Once | ORAL | Status: AC
  Administered 2017-03-24: 09:00:00 150 mg via ORAL
  Filled 2017-03-24: qty 1.5

## 2017-03-24 MED ORDER — INSULIN ASPART 100 UNIT/ML ~~LOC~~ SOLN
0.0000 [IU] | Freq: Three times a day (TID) | SUBCUTANEOUS | Status: DC
  Administered 2017-03-24: 1 [IU] via SUBCUTANEOUS
  Administered 2017-03-24 – 2017-03-25 (×2): 2 [IU] via SUBCUTANEOUS
  Administered 2017-03-26: 1 [IU] via SUBCUTANEOUS
  Administered 2017-03-26: 3 [IU] via SUBCUTANEOUS
  Filled 2017-03-24 (×5): qty 1

## 2017-03-24 NOTE — Evaluation (Signed)
Physical Therapy Evaluation Patient Details Name: Laurie Lin MRN: [id number] DOB: 1955/08/20 Today's Date: 03/24/2017   History of Present Illness  presented to ER secondary to progressive weakness, AMS; admitted for management of DKA, hypotension.  Clinical Impression  Upon evaluation, patient alert and oriented; follows simple commands and demonstrates fair insight into deficits.  Delayed processing and task initiation; limited insight into deficits.  Bilat UE/LE generally weak and deconditioned.  Currently requiring mod assist for bed mobility, sit/stand, basic transfers and very short-distance gait (5') with RW.  Very short, shuffling steps; poor balance.  Spontaneously sits with each attempt at standing, requiring max cuing for redirection to task; max cuing for walker placement and management. Would benefit from skilled PT to address above deficits and promote optimal return to PLOF; recommend transition to STR upon discharge from acute hospitalization.     Follow Up Recommendations SNF    Equipment Recommendations  Rolling walker with 5" wheels    Recommendations for Other Services       Precautions / Restrictions Precautions Precautions: Fall Restrictions Weight Bearing Restrictions: No      Mobility  Bed Mobility Overal bed mobility: Needs Assistance Bed Mobility: Supine to Sit     Supine to sit: Mod assist        Transfers Overall transfer level: Needs assistance Equipment used: Rolling walker (2 wheeled) Transfers: Sit to/from Stand Sit to Stand: Mod assist         General transfer comment: cuing for hand placement, assist for lift off and standing balance; consistent cuing for postural extension and bilat knee control (tends to maintain triple flexed posture)  Ambulation/Gait Ambulation/Gait assistance: Mod assist Ambulation Distance (Feet): 4 Feet Assistive device: Rolling walker (2 wheeled)       General Gait Details: very short, shuffling  steps with poor balance; tends to release grasp on RW, reaching for chair instead. Unsafe to attempt without RW and +1 assist  Stairs            Wheelchair Mobility    Modified Rankin (Stroke Patients Only)       Balance Overall balance assessment: Needs assistance Sitting-balance support: No upper extremity supported;Feet supported Sitting balance-Leahy Scale: Fair     Standing balance support: Bilateral upper extremity supported Standing balance-Leahy Scale: Poor                               Pertinent Vitals/Pain Pain Assessment: No/denies pain    Home Living Family/patient expects to be discharged to:: Private residence Living Arrangements: Spouse/significant other Available Help at Discharge: Family Type of Home: House Home Access: Stairs to enter Entrance Stairs-Rails: Right;Can reach Software engineer of Steps: 3 Home Layout: One level Home Equipment: None      Prior Function Level of Independence: Independent         Comments: Indep with ADLs, household and community mobilization without assist device; endorses at least 4-5 falls in previous six months     Hand Dominance        Extremity/Trunk Assessment   Upper Extremity Assessment Upper Extremity Assessment: Overall WFL for tasks assessed    Lower Extremity Assessment Lower Extremity Assessment: Generalized weakness(grossly 3-/5 throughout bilat LEs)       Communication   Communication: No difficulties  Cognition Arousal/Alertness: Awake/alert Behavior During Therapy: WFL for tasks assessed/performed Overall Cognitive Status: Impaired/Different from baseline  General Comments: oriented to self, location, general situation; delay in processing and task comprehension, decreased recall of new information      General Comments      Exercises Other Exercises Other Exercises: Sit/stand x3 with RW, mod assist +1  for lift off, postural extension and overall standing balance.  Quick to spontaneously sit with each trial; max cuing for attention to task and sustained activity. Other Exercises: Lower body dressing, seated edge of bed, mod assist to thread LEs; mod assist for sit/stand, standing balance and pulling pants over hips.   Assessment/Plan    PT Assessment Patient needs continued PT services  PT Problem List Decreased strength;Decreased cognition;Decreased activity tolerance;Decreased balance;Decreased mobility;Decreased knowledge of use of DME;Decreased safety awareness;Decreased knowledge of precautions       PT Treatment Interventions DME instruction;Gait training;Stair training;Functional mobility training;Therapeutic activities;Therapeutic exercise;Balance training;Patient/family education    PT Goals (Current goals can be found in the Care Plan section)  Acute Rehab PT Goals Patient Stated Goal: to return home PT Goal Formulation: With patient/family Time For Goal Achievement: 04/07/17 Potential to Achieve Goals: Good    Frequency Min 2X/week   Barriers to discharge Decreased caregiver support      Co-evaluation               AM-PAC PT "6 Clicks" Daily Activity  Outcome Measure Difficulty turning over in bed (including adjusting bedclothes, sheets and blankets)?: Unable Difficulty moving from lying on back to sitting on the side of the bed? : Unable Difficulty sitting down on and standing up from a chair with arms (e.g., wheelchair, bedside commode, etc,.)?: Unable Help needed moving to and from a bed to chair (including a wheelchair)?: A Lot Help needed walking in hospital room?: A Lot Help needed climbing 3-5 steps with a railing? : Total 6 Click Score: 8    End of Session Equipment Utilized During Treatment: Gait belt Activity Tolerance: Patient tolerated treatment well Patient left: in chair;with call bell/phone within reach;with chair alarm set Nurse  Communication: Mobility status PT Visit Diagnosis: Repeated falls (R29.6);Unsteadiness on feet (R26.81);Muscle weakness (generalized) (M62.81);Difficulty in walking, not elsewhere classified (R26.2)    Time: 1411-1450 PT Time Calculation (min) (ACUTE ONLY): 39 min   Charges:   PT Evaluation $PT Eval Moderate Complexity: 1 Mod PT Treatments $Therapeutic Activity: 23-37 mins   PT G Codes:   PT G-Codes **NOT FOR INPATIENT CLASS** Functional Assessment Tool Used: AM-PAC 6 Clicks Basic Mobility Functional Limitation: Mobility: Walking and moving around Mobility: Walking and Moving Around Current Status (Y6063): At least 60 percent but less than 80 percent impaired, limited or restricted Mobility: Walking and Moving Around Goal Status [phone]): At least 20 percent but less than 40 percent impaired, limited or restricted    Lavonia Eager H. Owens Shark, PT, DPT, NCS 03/24/17, 3:25 PM [phone]

## 2017-03-24 NOTE — Progress Notes (Signed)
[facility] [place] [facility] NAME: Laurie Lin    MR#:  [id number]  DATE OF BIRTH:  [birth year]  SUBJECTIVE:   Patient was quite agitated this morning and upset because her husband was not at bedside. Patient having some tremors related to alcohol withdrawal this morning. Awaiting physical therapy evaluation. Mental status has overall improved. She was a bit hypoglycemic this morning but she is eating now.  REVIEW OF SYSTEMS:    Review of Systems  Constitutional: Negative for chills and fever.  HENT: Negative for congestion and tinnitus.   Eyes: Negative for blurred vision and double vision.  Respiratory: Negative for cough, shortness of breath and wheezing.   Cardiovascular: Negative for chest pain, orthopnea and PND.  Gastrointestinal: Negative for abdominal pain, diarrhea, nausea and vomiting.  Genitourinary: Negative for dysuria and hematuria.  Neurological: Negative for dizziness, sensory change and focal weakness.  All other systems reviewed and are negative.   Nutrition: Carb control Tolerating Diet: Yes Tolerating PT: Await Eval.    DRUG ALLERGIES:  No Known Allergies  VITALS:  Blood pressure 108/60, pulse (!) 105, temperature 97.8 F (36.6 C), temperature source Oral, resp. rate 16, height 5' 10.5" (1.791 m), weight 85.3 kg (188 lb 1.6 oz), SpO2 92 %.  PHYSICAL EXAMINATION:   Physical Exam  GENERAL:  61 y.o.-year-old patient lying in bed lethargic but in NAD.  EYES: Pupils equal, round, reactive to light and accommodation. No scleral icterus. Extraocular muscles intact.  HEENT: Head atraumatic, normocephalic. Oropharynx and nasopharynx clear.  NECK:  Supple, no jugular venous distention. No thyroid enlargement, no tenderness.  LUNGS: Normal breath sounds bilaterally, no wheezing, rales, rhonchi. No use of accessory muscles of respiration.  CARDIOVASCULAR: S1, S2 normal. No murmurs, rubs, or gallops.  ABDOMEN: Soft, mild  tenderness on palpation, no rebound, rigidity, nondistended. Bowel sounds present. No organomegaly or mass.  EXTREMITIES: No cyanosis, clubbing or edema b/l.    NEUROLOGIC: Cranial nerves II through XII are intact. No focal Motor or sensory deficits b/l.  Globally weak and + Tremor. PSYCHIATRIC: The patient is alert and oriented x 3.  SKIN: No obvious rash, lesion, or ulcer.    LABORATORY PANEL:   CBC Recent Labs  Lab 03/24/17 0433  WBC 5.1  HGB 7.8*  HCT 23.2*  PLT 192   ------------------------------------------------------------------------------------------------------------------  Chemistries  Recent Labs  Lab 03/21/17 1033  03/22/17 1951  03/24/17 0433  NA 132*   < > 136   < > 133*  K 5.3*   < > 3.5   < > 3.7  CL 93*   < > 105   < > 107  CO2 <7*   < > 19*   < > 18*  GLUCOSE 620*   < > 137*   < > 38*  BUN 27*   < > 27*   < > 27*  CREATININE 2.58*   < > 3.30*   < > 3.39*  CALCIUM 9.1   < > 7.5*   < > 7.2*  MG  --    < > 1.7  --   --   AST 13*  --   --   --   --   ALT 8*  --   --   --   --   ALKPHOS 55  --   --   --   --   BILITOT 2.2*  --   --   --   --    < > =  values in this interval not displayed.   ------------------------------------------------------------------------------------------------------------------  Cardiac Enzymes Recent Labs  Lab 03/21/17 1033  TROPONINI <0.03   ------------------------------------------------------------------------------------------------------------------  RADIOLOGY:  Ct Head Wo Contrast  Result Date: 03/23/2017 CLINICAL DATA:  Confusion, acute and unexplained. History of alcohol abuse. DKA with severe metabolic acidosis. EXAM: CT HEAD WITHOUT CONTRAST TECHNIQUE: Contiguous axial images were obtained from the base of the skull through the vertex without intravenous contrast. COMPARISON:  None available (report from 08/04/1997 was reviewed) FINDINGS: Brain: No evidence of acute infarction, hemorrhage, hydrocephalus,  extra-axial collection or mass lesion/mass effect. Moderate atrophy. Chronic small vessel ischemia with periventricular gliosis. Vascular: Arterial calcification.  No hyperdense vessel. Skull: No acute or aggressive finding Sinuses/Orbits: Bilateral cataract resection.  No acute finding. IMPRESSION: 1. No acute finding. 2. Chronic small vessel ischemia and moderate atrophy. Electronically Signed   By: Monte Fantasia M.D.   On: 03/23/2017 12:57   US Abdomen Complete  Result Date: 03/22/2017 CLINICAL DATA:  Cirrhosis. EXAM: ABDOMEN ULTRASOUND COMPLETE COMPARISON:  None. FINDINGS: Gallbladder: No gallstones or wall thickening visualized. No sonographic Murphy sign noted by sonographer. Common bile duct: Diameter: 2 mm Liver: Diffuse increased echogenicity with no focal mass. Portal vein is patent on color Doppler imaging with normal direction of blood flow towards the liver. IVC: No abnormality visualized. Pancreas: Visualized portion unremarkable. Spleen: Size and appearance within normal limits. Right Kidney: Length: 11 cm. Echogenicity within normal limits. No mass or hydronephrosis visualized. Left Kidney: Length: 10.2 cm. Echogenicity within normal limits. No mass or hydronephrosis visualized. Abdominal aorta: No aneurysm visualized. Other findings: None. IMPRESSION: 1. Diffuse increased echogenicity in the liver without focal mass is nonspecific but could be due to the patient's reported cirrhosis or hepatic steatosis. Normal directional blood flow in the portal vein. No splenomegaly. Electronically Signed   By: Dorise Bullion III M.D   On: 03/22/2017 15:21     ASSESSMENT AND PLAN:   61 year old female with past medical history of hypertension, GERD, diabetes, chronic kidney disease stage IV who presents to the hospital due to altered mental status and noted to be in acute diabetic ketoacidosis.  1. Acute diabetic ketoacidosis-suspect secondary to patient's noncompliance as she has not taken her  insulin. -Much improved anion gap is closed, transitioned off the insulin drip and placed on scheduled insulin.  - Patient was hypoglycemic this morning therefore Lantus discontinued. Continue sliding scale insulin for now. Follow blood sugars. Encourage by mouth intake.  2. Altered mental status-secondary to acute DKA, AKI and and ETOH withdrawal.  -Improving, patient was more angry/agitated today but showing some mild tremor related to alcohol withdrawal and continue CIWA.  3. Alcohol abuse-continue CIWA protocol. - Cont. Thiamine, Folate.   4. Acute on Chronic kidney disease stage IV - shows creatinine slightly worse from baseline. I will continue gentle IV fluids. Patient had some urinary retention yesterday. We'll get renal ultrasound.  5. Anemia of chronic disease - hg. Stable.  Likely due to CKD and will cont. To monitor.   Will get PT eval.    All the records are reviewed and case discussed with Care Management/Social Worker. Management plans discussed with the patient, family and they are in agreement.  CODE STATUS: Full code  DVT Prophylaxis: TED's & SCD's.    TOTAL TIME TAKING CARE OF THIS PATIENT: 30 minutes.   POSSIBLE D/C IN 1-2 DAYS, DEPENDING ON CLINICAL CONDITION.   Henreitta Leber M.D on 03/24/2017 at 1:10 PM  Between 7am to 6pm - Pager -  [phone]  After 6pm go to [email or web address] - Proofreader  [facility]  Office  [phone]  CC: Primary care physician; Patient, No Pcp Per

## 2017-03-24 NOTE — Progress Notes (Signed)
Subjective: Husband in room today.  He reports patient continues to improve cognitively.  She is quite angry that she has to be here.  Objective: Current vital signs: BP 108/60 (BP Location: Left Arm)   Pulse (!) 105   Temp 97.8 F (36.6 C) (Oral)   Resp 16   Ht 5' 10.5" (1.791 m)   Wt 85.3 kg (188 lb 1.6 oz)   LMP  (LMP Unknown)   SpO2 92%   BMI 26.61 kg/m  Vital signs in last 24 hours: Temp:  [97.8 F (36.6 C)-99.4 F (37.4 C)] 97.8 F (36.6 C) (12/04 0452) Pulse Rate:  [100-105] 105 (12/04 0452) Resp:  [16-23] 16 (12/04 0452) BP: (108-118)/(60-80) 108/60 (12/04 0452) SpO2:  [92 %-98 %] 92 % (12/04 0452) Weight:  [85.3 kg (188 lb 1.6 oz)] 85.3 kg (188 lb 1.6 oz) (12/03 1418)  Intake/Output from previous day: 12/03 0701 - 12/04 0700 In: 1625 [I.V.:1625] Out: 950 [Urine:950] Intake/Output this shift: No intake/output data recorded. Nutritional status: Diet Carb Modified Fluid consistency: Thin; Room service appropriate? Yes  Neurologic Exam: Mental Status: Alert, oriented to name and place.  Less prompting required.  Speech fluent without evidence of aphasia.  Able to follow simple commands without difficulty but requires some reinforcement for 3-step commands. Cranial Nerves: II: Discs flat bilaterally; Visual fields grossly normal, pupils equal, round, reactive to light and accommodation III,IV, VI: ptosis not present, extra-ocular motions intact bilaterally V,VII: smile symmetric, facial light touch sensation normal bilaterally VIII: hearing normal bilaterally IX,X: gag reflex present XI: bilateral shoulder shrug XII: midline tongue extension Motor: Right :  Upper extremity   5/5                                      Left:     Upper extremity   5/5             Lower extremity   4+/5 proximally with full strength distally                                       Lower extremity   4+/5 with full strength distally Tone and bulk:normal tone throughout; no atrophy  noted Sensory: Pinprick and light touch intact throughout, bilaterally    Lab Results: Basic Metabolic Panel: Recent Labs  Lab 03/22/17 0418 03/22/17 0802 03/22/17 1218 03/22/17 1547 03/22/17 1951  03/23/17 0957 03/23/17 1333 03/23/17 1621 03/23/17 2003 03/24/17 0027 03/24/17 0433  NA 136 137 138 137 136   < > 138 137 138 137  --  133*  K 3.3* 3.6 3.5 3.4* 3.5   < > 3.8 3.8 3.8 3.7  --  3.7  CL 103 104 103 105 105   < > 106 105 106 108  --  107  CO2 23 22 22  20* 19*   < > 19* 19* 19* 19*  --  18*  GLUCOSE 162* 139* 86 107* 137*   < > 159* 154* 76 58*  --  38*  BUN 29* 29* 28* 28* 27*   < > 27* 28* 27* 28*  --  27*  CREATININE 2.61* 2.87* 2.90* 2.88* 3.30*   < > 3.41* 3.40* 3.32* 3.31*  --  3.39*  CALCIUM 8.3* 8.3* 8.0* 7.8* 7.5*   < > 7.4* 7.4* 7.4* 7.2*  --  7.2*  MG 2.0 1.9 1.8 1.7 1.7  --   --   --   --   --   --   --   PHOS 1.7* 3.5 3.9 3.8 3.7   < > 4.3 4.1 3.8 3.5 3.3 3.3   < > = values in this interval not displayed.    Liver Function Tests: Recent Labs  Lab 03/21/17 1033  AST 13*  ALT 8*  ALKPHOS 55  BILITOT 2.2*  PROT 7.4  ALBUMIN 3.1*   No results for input(s): LIPASE, AMYLASE in the last 168 hours. Recent Labs  Lab 03/21/17 1322 03/22/17 1218  AMMONIA 28 20    CBC: Recent Labs  Lab 03/21/17 1033 03/21/17 1508 03/22/17 0418 03/22/17 1218 03/23/17 0426 03/24/17 0433  WBC 11.5* 8.4 8.5  --  9.4 5.1  NEUTROABS 10.0*  --   --   --   --   --   HGB 10.1* 9.0* 7.9* 8.3* 8.1* 7.8*  HCT 33.3* 28.4* 23.6* 24.5* 24.0* 23.2*  MCV 109.1* 104.2* 96.2  --  96.6 97.6  PLT 284 240 228  --  198 192    Cardiac Enzymes: Recent Labs  Lab 03/21/17 1033  TROPONINI <0.03    Lipid Panel: No results for input(s): CHOL, TRIG, HDL, CHOLHDL, VLDL, LDLCALC in the last 168 hours.  CBG: Recent Labs  Lab 03/24/17 0444 03/24/17 0516 03/24/17 0601 03/24/17 0622 03/24/17 0750  GLUCAP 31* 67* 77* 71 128*    Microbiology: Results for orders placed or  performed during the hospital encounter of 03/21/17  Blood Culture (routine x 2)     Status: None (Preliminary result)   Collection Time: 03/21/17 10:33 AM  Result Value Ref Range Status   Specimen Description BLOOD Blood Culture adequate volume  Final   Special Requests   Final    BOTTLES DRAWN AEROBIC AND ANAEROBIC RIGHT ANTECUBITAL   Culture NO GROWTH 3 DAYS  Final   Report Status PENDING  Incomplete  Blood Culture (routine x 2)     Status: None (Preliminary result)   Collection Time: 03/21/17 11:11 AM  Result Value Ref Range Status   Specimen Description BLOOD Blood Culture adequate volume  Final   Special Requests   Final    BOTTLES DRAWN AEROBIC AND ANAEROBIC BLOOD RIGHT HAND   Culture NO GROWTH 3 DAYS  Final   Report Status PENDING  Incomplete  MRSA PCR Screening     Status: None   Collection Time: 03/21/17  2:45 PM  Result Value Ref Range Status   MRSA by PCR NEGATIVE NEGATIVE Final    Comment:        The GeneXpert MRSA Assay (FDA approved for NASAL specimens only), is one component of a comprehensive MRSA colonization surveillance program. It is not intended to diagnose MRSA infection nor to guide or monitor treatment for MRSA infections.   Urine Culture     Status: None   Collection Time: 03/21/17  3:51 PM  Result Value Ref Range Status   Specimen Description URINE, RANDOM  Final   Special Requests NONE  Final   Culture   Final    NO GROWTH Performed at [facility], [address] [place], [place] [address]    Report Status 03/22/2017 FINAL  Final    Coagulation Studies: Recent Labs    03/23/17 1333  LABPROT 12.9  INR 0.98    Imaging: Ct Head Wo Contrast  Result Date: 03/23/2017 CLINICAL DATA:  Confusion, acute  and unexplained. History of alcohol abuse. DKA with severe metabolic acidosis. EXAM: CT HEAD WITHOUT CONTRAST TECHNIQUE: Contiguous axial images were obtained from the base of the skull through the vertex without intravenous contrast.  COMPARISON:  None available (report from 08/04/1997 was reviewed) FINDINGS: Brain: No evidence of acute infarction, hemorrhage, hydrocephalus, extra-axial collection or mass lesion/mass effect. Moderate atrophy. Chronic small vessel ischemia with periventricular gliosis. Vascular: Arterial calcification.  No hyperdense vessel. Skull: No acute or aggressive finding Sinuses/Orbits: Bilateral cataract resection.  No acute finding. IMPRESSION: 1. No acute finding. 2. Chronic small vessel ischemia and moderate atrophy. Electronically Signed   By: Monte Fantasia M.D.   On: 03/23/2017 12:57   US Abdomen Complete  Result Date: 03/22/2017 CLINICAL DATA:  Cirrhosis. EXAM: ABDOMEN ULTRASOUND COMPLETE COMPARISON:  None. FINDINGS: Gallbladder: No gallstones or wall thickening visualized. No sonographic Murphy sign noted by sonographer. Common bile duct: Diameter: 2 mm Liver: Diffuse increased echogenicity with no focal mass. Portal vein is patent on color Doppler imaging with normal direction of blood flow towards the liver. IVC: No abnormality visualized. Pancreas: Visualized portion unremarkable. Spleen: Size and appearance within normal limits. Right Kidney: Length: 11 cm. Echogenicity within normal limits. No mass or hydronephrosis visualized. Left Kidney: Length: 10.2 cm. Echogenicity within normal limits. No mass or hydronephrosis visualized. Abdominal aorta: No aneurysm visualized. Other findings: None. IMPRESSION: 1. Diffuse increased echogenicity in the liver without focal mass is nonspecific but could be due to the patient's reported cirrhosis or hepatic steatosis. Normal directional blood flow in the portal vein. No splenomegaly. Electronically Signed   By: Dorise Bullion III M.D   On: 03/22/2017 15:21    Medications:  I have reviewed the patient's current medications. Scheduled: . folic acid  1 mg Oral Daily  . insulin aspart  0-5 Units Subcutaneous QHS  . insulin aspart  0-9 Units Subcutaneous TID WC   . multivitamin with minerals  1 tablet Oral Daily  . pantoprazole (PROTONIX) IV  40 mg Intravenous Q12H  . thiamine  100 mg Oral Daily   Or  . thiamine  100 mg Intravenous Daily    Assessment/Plan: Patient continues to improve.  Will continue to follow with you prn.   LOS: 3 days   Alexis Goodell, MD Neurology [phone] 03/24/2017  11:07 AM

## 2017-03-24 NOTE — Progress Notes (Signed)
[place], [place] 03/24/17  Subjective:   Patient is doing fair.  She is upset about being in the hospital. Was able to eat some breakfast.  No nausea or vomiting reported No shortness of breath  urine output recorded at 950 cc Serum creatinine 3.39 this morning Potassium is normal at 3.7  Objective:  Vital signs in last 24 hours:  Temp:  [97.8 F (36.6 C)-99.4 F (37.4 C)] 97.8 F (36.6 C) (12/04 0452) Pulse Rate:  [100-105] 105 (12/04 0452) Resp:  [16-18] 16 (12/04 0452) BP: (108-118)/(60-64) 108/60 (12/04 0452) SpO2:  [92 %-96 %] 92 % (12/04 0452) Weight:  [85.3 kg (188 lb 1.6 oz)] 85.3 kg (188 lb 1.6 oz) (12/03 1418)  Weight change:  Filed Weights   03/21/17 1104 03/21/17 1337 03/23/17 1418  Weight: 79.4 kg (175 lb) 77.8 kg (171 lb 8.3 oz) 85.3 kg (188 lb 1.6 oz)    Intake/Output:    Intake/Output Summary (Last 24 hours) at 03/24/2017 1219 Last data filed at 03/24/2017 0500 Gross per 24 hour  Intake -  Output 700 ml  Net -700 ml     Physical Exam: General:  No acute distress, laying in the bed  HEENT  anicteric, moist oral mucous membranes  Neck  supple  Pulm/lungs  normal breathing effort, mild scattered rhonchi  CVS/Heart  regular rhythm, no rub or gallop  Abdomen:   Soft, nontender  Extremities:  No edema  Neurologic:  Alert, oriented  Skin:  No acute rashes          Basic Metabolic Panel:  Recent Labs  Lab 03/22/17 0418 03/22/17 0802 03/22/17 1218 03/22/17 1547 03/22/17 1951  03/23/17 0957 03/23/17 1333 03/23/17 1621 03/23/17 2003 03/24/17 0027 03/24/17 0433  NA 136 137 138 137 136   < > 138 137 138 137  --  133*  K 3.3* 3.6 3.5 3.4* 3.5   < > 3.8 3.8 3.8 3.7  --  3.7  CL 103 104 103 105 105   < > 106 105 106 108  --  107  CO2 23 22 22  20* 19*   < > 19* 19* 19* 19*  --  18*  GLUCOSE 162* 139* 86 107* 137*   < > 159* 154* 76 58*  --  38*  BUN 29* 29* 28* 28* 27*   < > 27* 28* 27* 28*  --  27*  CREATININE  2.61* 2.87* 2.90* 2.88* 3.30*   < > 3.41* 3.40* 3.32* 3.31*  --  3.39*  CALCIUM 8.3* 8.3* 8.0* 7.8* 7.5*   < > 7.4* 7.4* 7.4* 7.2*  --  7.2*  MG 2.0 1.9 1.8 1.7 1.7  --   --   --   --   --   --   --   PHOS 1.7* 3.5 3.9 3.8 3.7   < > 4.3 4.1 3.8 3.5 3.3 3.3   < > = values in this interval not displayed.     CBC: Recent Labs  Lab 03/21/17 1033 03/21/17 1508 03/22/17 0418 03/22/17 1218 03/23/17 0426 03/24/17 0433  WBC 11.5* 8.4 8.5  --  9.4 5.1  NEUTROABS 10.0*  --   --   --   --   --   HGB 10.1* 9.0* 7.9* 8.3* 8.1* 7.8*  HCT 33.3* 28.4* 23.6* 24.5* 24.0* 23.2*  MCV 109.1* 104.2* 96.2  --  96.6 97.6  PLT 284 240 228  --  198 192     No results  found for: HEPBSAG, HEPBSAB, HEPBIGM    Microbiology:  Recent Results (from the past 240 hour(s))  Blood Culture (routine x 2)     Status: None (Preliminary result)   Collection Time: 03/21/17 10:33 AM  Result Value Ref Range Status   Specimen Description BLOOD Blood Culture adequate volume  Final   Special Requests   Final    BOTTLES DRAWN AEROBIC AND ANAEROBIC RIGHT ANTECUBITAL   Culture NO GROWTH 3 DAYS  Final   Report Status PENDING  Incomplete  Blood Culture (routine x 2)     Status: None (Preliminary result)   Collection Time: 03/21/17 11:11 AM  Result Value Ref Range Status   Specimen Description BLOOD Blood Culture adequate volume  Final   Special Requests   Final    BOTTLES DRAWN AEROBIC AND ANAEROBIC BLOOD RIGHT HAND   Culture NO GROWTH 3 DAYS  Final   Report Status PENDING  Incomplete  MRSA PCR Screening     Status: None   Collection Time: 03/21/17  2:45 PM  Result Value Ref Range Status   MRSA by PCR NEGATIVE NEGATIVE Final    Comment:        The GeneXpert MRSA Assay (FDA approved for NASAL specimens only), is one component of a comprehensive MRSA colonization surveillance program. It is not intended to diagnose MRSA infection nor to guide or monitor treatment for MRSA infections.   Urine Culture      Status: None   Collection Time: 03/21/17  3:51 PM  Result Value Ref Range Status   Specimen Description URINE, RANDOM  Final   Special Requests NONE  Final   Culture   Final    NO GROWTH Performed at [facility], [address] [place], [place] [address]    Report Status 03/22/2017 FINAL  Final    Coagulation Studies: Recent Labs    03/23/17 1333  LABPROT 12.9  INR 0.98    Urinalysis: No results for input(s): COLORURINE, LABSPEC, PHURINE, GLUCOSEU, HGBUR, BILIRUBINUR, KETONESUR, PROTEINUR, UROBILINOGEN, NITRITE, LEUKOCYTESUR in the last 72 hours.  Invalid input(s): APPERANCEUR    Imaging: Ct Head Wo Contrast  Result Date: 03/23/2017 CLINICAL DATA:  Confusion, acute and unexplained. History of alcohol abuse. DKA with severe metabolic acidosis. EXAM: CT HEAD WITHOUT CONTRAST TECHNIQUE: Contiguous axial images were obtained from the base of the skull through the vertex without intravenous contrast. COMPARISON:  None available (report from 08/04/1997 was reviewed) FINDINGS: Brain: No evidence of acute infarction, hemorrhage, hydrocephalus, extra-axial collection or mass lesion/mass effect. Moderate atrophy. Chronic small vessel ischemia with periventricular gliosis. Vascular: Arterial calcification.  No hyperdense vessel. Skull: No acute or aggressive finding Sinuses/Orbits: Bilateral cataract resection.  No acute finding. IMPRESSION: 1. No acute finding. 2. Chronic small vessel ischemia and moderate atrophy. Electronically Signed   By: Monte Fantasia M.D.   On: 03/23/2017 12:57   US Abdomen Complete  Result Date: 03/22/2017 CLINICAL DATA:  Cirrhosis. EXAM: ABDOMEN ULTRASOUND COMPLETE COMPARISON:  None. FINDINGS: Gallbladder: No gallstones or wall thickening visualized. No sonographic Murphy sign noted by sonographer. Common bile duct: Diameter: 2 mm Liver: Diffuse increased echogenicity with no focal mass. Portal vein is patent on color Doppler imaging with normal direction of  blood flow towards the liver. IVC: No abnormality visualized. Pancreas: Visualized portion unremarkable. Spleen: Size and appearance within normal limits. Right Kidney: Length: 11 cm. Echogenicity within normal limits. No mass or hydronephrosis visualized. Left Kidney: Length: 10.2 cm. Echogenicity within normal limits. No mass or hydronephrosis  visualized. Abdominal aorta: No aneurysm visualized. Other findings: None. IMPRESSION: 1. Diffuse increased echogenicity in the liver without focal mass is nonspecific but could be due to the patient's reported cirrhosis or hepatic steatosis. Normal directional blood flow in the portal vein. No splenomegaly. Electronically Signed   By: Dorise Bullion III M.D   On: 03/22/2017 15:21     Medications:   . sodium chloride 75 mL/hr at 03/24/17 0905   . folic acid  1 mg Oral Daily  . insulin aspart  0-5 Units Subcutaneous QHS  . insulin aspart  0-9 Units Subcutaneous TID WC  . multivitamin with minerals  1 tablet Oral Daily  . pantoprazole (PROTONIX) IV  40 mg Intravenous Q12H  . thiamine  100 mg Oral Daily   Or  . thiamine  100 mg Intravenous Daily   guaiFENesin, LORazepam **OR** LORazepam, ondansetron (ZOFRAN) IV  Assessment/ Plan:  61 y.o. 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   1.  Acute renal failure Baseline creatinine 1.1 from July 2017 Admission creatinine is 2.58 on December 1.  Since admission, creatinine continues to increase but appears to have stabilized at 3.4.  Acute renal failure is likely multifactorial from volume depletion, DKA.  Possibility of interstitial nephritis. Electrolytes and volume status are acceptable.  No acute indication for dialysis at present Continue IV fluid supplementation until oral intake is back to normal.  2.  Chronic kidney  disease stage III Baseline creatinine 1.1 from July 2017/GFR 50 Renal ultrasound shows right kidney 11 cm, left kidney 10.2 cm, no hydronephrosis or mass  3.  Diabetes type 1 with chronic kidney disease Diabetes is poorly controlled Patient used to see Dr. Frederik Pear at [facility] known hemoglobin A1c is12.3% from December 2018 Repeat HbA1c ordered  4.  Proteinuria Likely related to diabetic nephropathy We will obtain a urine protein to creatinine ratio  5.  Urinary retention Yesterday, patient had bladder scan of 700 cc.  In-N-Out cath was done Continue to monitor bladder scan every 6 hours     LOS: [place] 12/4/201812:19 PM  [place], [place]

## 2017-03-24 NOTE — Clinical Social Work Note (Signed)
Clinical Social Work Assessment  Patient Details  Name: Laurie Lin MRN: [id number] Date of Birth: 1955/05/17  Date of referral:  03/24/17               Reason for consult:  Facility Placement                Permission sought to share information with:  Chartered certified accountant granted to share information::  Yes, Verbal Permission Granted  Name::      [place]::   [name]   Relationship::     Contact Information:     Housing/Transportation Living arrangements for the past 2 months:  [place] of Information:  Patient, Spouse Patient Interpreter Needed:  None Criminal Activity/Legal Involvement Pertinent to Current Situation/Hospitalization:  No - Comment as needed Significant Relationships:  Spouse Lives with:  Spouse Do you feel safe going back to the place where you live?  Yes Need for family participation in patient care:  Yes (Comment)  Care giving concerns:  Patient lives in [place] with her husband Laurie Lin home # [phone], cell # [phone].    Social Worker assessment / plan:  Holiday representative (CSW) reviewed chart and noted that PT is recommending SNF. CSW met with patient and her husband Laurie Lin was at bedside. Patient was alert and oriented X4 and was sitting up in the chair at bedside. CSW introduced self and explained role of CSW department. Patient reported that she lives in [place] with her husband. CSW explained SNF process and that [place] will have to approve SNF. Patient and her husband are agreeable to SNF search in [facility] and prefer [facility]. FL2 complete and faxed out.   CSW presented bed offers to patient and her husband. They chose [facility]. Per Alger Simons liaison he will start [facility] authorization today. CSW will continue to follow and assist as needed.   Employment status:  Retired Forensic scientist:  Managed Care PT Recommendations:  [place] /  Referral to community resources:  [place]  Patient/Family's Response to care:  Patient and her husband are agreeable for patient to go to [facility] for short term rehab.   Patient/Family's Understanding of and Emotional Response to Diagnosis, Current Treatment, and Prognosis:  Patient and her husband were very pleasant and thanked CSW for assistance.   Emotional Assessment Appearance:  Appears stated age Attitude/Demeanor/Rapport:    Affect (typically observed):  Accepting, Adaptable, Pleasant Orientation:  Oriented to Self, Oriented to Place, Oriented to  Time, Oriented to Situation Alcohol / Substance use:  Not Applicable Psych involvement (Current and /or in the community):  No (Comment)  Discharge Needs  Concerns to be addressed:  Discharge Planning Concerns Readmission within the last 30 days:  No Current discharge risk:  Dependent with Mobility Barriers to Discharge:  Continued Medical Work up   [employer], Veronia Beets, LCSW 03/24/2017, 4:32 PM

## 2017-03-24 NOTE — Clinical Social Work Placement (Signed)
   CLINICAL SOCIAL WORK PLACEMENT  NOTE  Date:  03/24/2017  Patient Details  Name: MERILYN PAGAN MRN: [id number] Date of Birth: [birth year]  Clinical Social Work is seeking post-discharge placement for this patient at the [place] level of care (*CSW will initial, date and re-position this form in  chart as items are completed):  Yes   Patient/family provided with [place] Work Department's list of facilities offering this level of care within the geographic area requested by the patient (or if unable, by the patient's family).  Yes   Patient/family informed of their freedom to choose among providers that offer the needed level of care, that participate in Medicare, Medicaid or managed care program needed by the patient, have an available bed and are willing to accept the patient.  Yes   Patient/family informed of [facility]'s ownership interest in [facility] and [facility], as well as of the fact that they are under no obligation to receive care at these facilities.  PASRR submitted to EDS on 03/24/17     PASRR number received on 03/24/17     Existing PASRR number confirmed on       FL2 transmitted to all facilities in geographic area requested by pt/family on 03/24/17     FL2 transmitted to all facilities within larger geographic area on       Patient informed that his/her managed care company has contracts with or will negotiate with certain facilities, including the following:        Yes   Patient/family informed of bed offers received.  Patient chooses bed at ([facility] )     Physician recommends and patient chooses bed at      Patient to be transferred to   on  .  Patient to be transferred to facility by       Patient family notified on   of transfer.  Name of family member notified:        PHYSICIAN       Additional Comment:    _______________________________________________ Kelechi Orgeron, Veronia Beets, LCSW 03/24/2017, 4:31  PM

## 2017-03-24 NOTE — Progress Notes (Signed)
Hypoglycemic Event  CBG: 31  Treatment: 4 oz orange juice Symptoms: Asymptomatic  Follow-up CBG: 0516 CBG Result:45 Follow-up CBG:  9379 CBG: 49   Possible Reasons for Event: change in meds Comments/MD notified:Di[place]    Merritt Kibby Miguel Dibble

## 2017-03-24 NOTE — NC FL2 (Signed)
[place] LEVEL OF CARE SCREENING TOOL     IDENTIFICATION  Patient Name: Laurie Lin Birthdate: 04-Jun-1955 Sex: female Admission Date (Current Location): 03/21/2017  [place] and [place] Number:  Engineering geologist and Address:  [facility], [address], [place], [place] [address]      Provider Number: [id number]  Attending Physician Name and Address:  Henreitta Leber, MD  Relative Name and Phone Number:       Current Level of Care: Hospital Recommended Level of Care: [place] Prior Approval Number:    Date Approved/Denied:   PASRR Number: [id number])  Discharge Plan: SNF    Current Diagnoses: Patient Active Problem List   Diagnosis Date Noted  . DKA (diabetic ketoacidoses) (Accident) 03/21/2017    Orientation RESPIRATION BLADDER Height & Weight     Self, Place  Normal Continent Weight: 188 lb 1.6 oz (85.3 kg) Height:  5' 10.5" (179.1 cm)  BEHAVIORAL SYMPTOMS/MOOD NEUROLOGICAL BOWEL NUTRITION STATUS      Incontinent Diet(Diet: Carb Modified. )  AMBULATORY STATUS COMMUNICATION OF NEEDS Skin   Extensive Assist Verbally Normal                       Personal Care Assistance Level of Assistance  Bathing, Feeding, Dressing Bathing Assistance: Limited assistance Feeding assistance: Independent Dressing Assistance: Limited assistance     Functional Limitations Info  Sight, Hearing, Speech Sight Info: Adequate Hearing Info: Adequate Speech Info: Adequate    SPECIAL CARE FACTORS FREQUENCY  PT (By licensed PT), OT (By licensed OT)     PT Frequency: (5) OT Frequency: (5)            Contractures      Additional Factors Info  Code Status, Allergies Code Status Info: (Full Code. ) Allergies Info: (No Known Allergies. )           Current Medications (03/24/2017):  This is the current hospital active medication list Current Facility-Administered Medications  Medication Dose Route  Frequency Provider Last Rate Last Dose  . 0.9 %  sodium chloride infusion   Intravenous Continuous Henreitta Leber, MD 75 mL/hr at 03/24/17 0905    . folic acid (FOLVITE) tablet 1 mg  1 mg Oral Daily Dustin Flock, MD   1 mg at 03/24/17 0845  . guaiFENesin (ROBITUSSIN) 100 MG/5ML solution 100 mg  5 mL Oral Q4H PRN Henreitta Leber, MD   100 mg at 03/24/17 0847  . insulin aspart (novoLOG) injection 0-5 Units  0-5 Units Subcutaneous QHS Sainani, Vivek J, MD      . insulin aspart (novoLOG) injection 0-9 Units  0-9 Units Subcutaneous TID WC Henreitta Leber, MD   1 Units at 03/24/17 0844  . insulin glargine (LANTUS) injection 10 Units  10 Units Subcutaneous QHS Henreitta Leber, MD      . multivitamin with minerals tablet 1 tablet  1 tablet Oral Daily Dustin Flock, MD   1 tablet at 03/24/17 0845  . ondansetron (ZOFRAN) injection 4 mg  4 mg Intravenous Q6H PRN Nettie Elm, MD   4 mg at 03/24/17 0845  . pantoprazole (PROTONIX) injection 40 mg  40 mg Intravenous Q12H Dustin Flock, MD   40 mg at 03/24/17 0845  . thiamine (VITAMIN B-1) tablet 100 mg  100 mg Oral Daily Dustin Flock, MD   100 mg at 03/24/17 0845   Or  . thiamine (B-1) injection 100 mg  100 mg Intravenous  Daily Dustin Flock, MD         Discharge Medications: Please see discharge summary for a list of discharge medications.  Relevant Imaging Results:  Relevant Lab Results:   Additional Information (SSN: [id number])  Macaiah Mangal, Veronia Beets, LCSW

## 2017-03-24 NOTE — Progress Notes (Addendum)
Inpatient Diabetes Program Recommendations  AACE/ADA: New Consensus Statement on Inpatient Glycemic Control (2015)  Target Ranges:  Prepandial:   less than 140 mg/dL      [facility] postprandial:   less than 180 mg/dL (1-2 hours)      Critically ill patients:  140 - 180 mg/dL   Results for Laurie Lin, Laurie Lin (MRN [id number]) as of 03/24/2017 07:55  Ref. Range 03/24/2017 00:03 03/24/2017 04:44 03/24/2017 05:16 03/24/2017 06:01 03/24/2017 06:22  Glucose-Capillary Latest Ref Range: 65 - 99 mg/dL 82 31 (LL) 45 (L) 49 (L) 71  Results for Laurie Lin, Laurie Lin (MRN [id number]) as of 03/24/2017 07:55  Ref. Range 03/23/2017 08:28 03/23/2017 11:36 03/23/2017 15:43 03/23/2017 20:21 03/23/2017 20:23 03/23/2017 20:47 03/23/2017 21:23 03/23/2017 21:49  Glucose-Capillary Latest Ref Range: 65 - 99 mg/dL 115 (H) 173 (H) 86 50 (L) 50 (L) 59 (L) 74 82  Results for Laurie Lin, Laurie Lin (MRN [id number]) as of 03/24/2017 07:55  Ref. Range 03/23/2017 16:21  Hemoglobin A1C Latest Ref Range: 4.8 - 5.6 % 12.3 (H)   Review of Glycemic Control  Diabetes history: DM1 Outpatient Diabetes medications: Lantus 12 units QPM and Novolog 1 unit for every 15 grams of carbs and Novolog 1 unit for every 20 mg/dl above target glucose Current orders for Inpatient glycemic control: Novolog 0-9 units TID with meals, Novolog 0-5 units QHS  Inpatient Diabetes Program Recommendations: Insulin - Basal: Patient received Lantus 40 units on 03/23/17 at 8:38 am and patient has experienced several episodes of hypoglycemia since then. Noted Lantus was discontinued. Patient will need basal insulin but recommend much lower dose than previously ordered.  Once glucose is consistently greater than 180 mg/dl without any hypoglycemia treatment, please consider ordering Lantus 17 units Q24H (based on 85 kg x 0.2 units). Correction (SSI): Noted Novolog correction scale changed to Sensitive Scale this morning. A1C: A1C 12.3% on 03/23/17 indicating an average glucose of 306 mg/dl over the  past 2-3 months. Patient needs to follow up with Dr. Frederik Pear regarding DM control. Outpatient Referral: Patient would like to be referred to a local Endocrinologist for follow up regarding DM management.    Addendum 03/24/17@12 :35-Spoke with patient and her husband about diabetes and home regimen for diabetes control. Patient is more engaged in conversation today. Patient reports that she was seeing Dr. Frederik Pear but has not seen her in over 1 year. Patient states that insulin refills are authorized by Dr. Frederik Pear. Patient reports that it is difficult to get in to see Dr. Frederik Pear and she is interested in being referred to a local Endocrinologist. Patient states that she has Lantus and Novolog insulin at home and she was taking Lantus 12 units QPM and Novolog 1 unit for every 15 grams of carbs and Novolog 1 unit for every 20 mg/dl above target glucose. Inquired about symptoms of hypoglycemia yesterday and today and patient states that she has not had any symptoms of hypoglycemia while inpatient. Patient states that she does not know why the nurses have been trying to make her drink orange juice. Discussed hypoglycemia along with treatment. Also discussed danger of hypoglycemia unawareness. Explained that patient received Lantus 40 units on 03/23/17 which is likely too much basal insulin for her and she experienced hypoglycemia as a result.  Patient admits that she has not taken insulin as prescribed over the past week and she has not been checking her glucose lately because "I can't get any blood out of my fingers and it hurts."  Discussed FreeStyle Libre (continuious glucose monitoring  sensor) and patient states that she has seen commercials for it and she plans to get one.  Inquired about prior A1C and patient reports that her last A1C was in the 9% range.  Discussed A1C results (12.3% on 03/23/17) and explained that her current A1C indicates an average glucose of 306 mg/dl over the past 2-3 months. Discussed glucose and  A1C goals. Discussed importance of checking CBGs and maintaining good CBG control to prevent long-term and short-term complications. Explained how hyperglycemia leads to damage within blood vessels which lead to the common complications seen with uncontrolled diabetes. Stressed to the patient the importance of improving glycemic control to prevent further complications from uncontrolled diabetes. Discussed need to take insulin consistently since she has DM1. Discussed Lantus and Novolog in more detail and explained that she still needs to take Lantus insulin even if she is not eating.  Encouraged patient to check her glucose as MD advises and to keep a log book of glucose readings and insulin taken which she will need to take to doctor appointments. Encouraged patient to talk with her doctor about the [facility] if she is interested (as she will need a prescription).  Patient verbalized understanding of information discussed ands he states that she has no further questions at this time related to diabetes.  Thanks, Barnie Alderman, RN, MSN, CDE Diabetes Coordinator Inpatient Diabetes Program [phone] (Team Pager from 8am to 5pm)

## 2017-03-24 NOTE — Progress Notes (Signed)
Pt is refusing to drink anything at this time, CBG is at 49, I will try to encourage pt to drink some milk and continue to monitor CBG's.

## 2017-03-25 LAB — BASIC METABOLIC PANEL
Anion gap: 8 (ref 5–15)
BUN: 28 mg/dL — AB (ref 6–20)
CHLORIDE: 110 mmol/L (ref 101–111)
CO2: 19 mmol/L — AB (ref 22–32)
CREATININE: 3.28 mg/dL — AB (ref 0.44–1.00)
Calcium: 7.2 mg/dL — ABNORMAL LOW (ref 8.9–10.3)
GFR calc Af Amer: 16 mL/min — ABNORMAL LOW (ref >60)
GFR calc non Af Amer: 14 mL/min — ABNORMAL LOW (ref >60)
GLUCOSE: 98 mg/dL (ref 65–99)
POTASSIUM: 3.9 mmol/L (ref 3.5–5.1)
Sodium: 137 mmol/L (ref 135–145)

## 2017-03-25 LAB — CBC
HEMATOCRIT: 23.3 % — AB (ref 35.0–47.0)
Hemoglobin: 7.6 g/dL — ABNORMAL LOW (ref 12.0–16.0)
MCH: 32 pg (ref 26.0–34.0)
MCHC: 32.7 g/dL (ref 32.0–36.0)
MCV: 98 fL (ref 80.0–100.0)
PLATELETS: 197 10*3/uL (ref 150–440)
RBC: 2.38 MIL/uL — ABNORMAL LOW (ref 3.80–5.20)
RDW: 15.2 % — AB (ref 11.5–14.5)
WBC: 3.9 10*3/uL (ref 3.6–11.0)

## 2017-03-25 LAB — GLUCOSE, CAPILLARY
Glucose-Capillary: 77 mg/dL (ref 65–99)
Glucose-Capillary: 102 mg/dL — ABNORMAL HIGH (ref 65–99)
Glucose-Capillary: 157 mg/dL — ABNORMAL HIGH (ref 65–99)
Glucose-Capillary: 127 mg/dL — ABNORMAL HIGH (ref 65–99)

## 2017-03-25 MED ORDER — PANTOPRAZOLE SODIUM 40 MG PO TBEC
40.0000 mg | DELAYED_RELEASE_TABLET | Freq: Two times a day (BID) | ORAL | Status: DC
  Administered 2017-03-25 – 2017-03-27 (×5): 40 mg via ORAL
  Filled 2017-03-25 (×5): qty 1

## 2017-03-25 NOTE — Progress Notes (Signed)
[facility] NAME: Laurie Lin    MR#:  [id number]  DATE OF BIRTH:  1955/12/08  SUBJECTIVE:   No acute events overnight.  BS Stable. Eating breakfast.  Renal function about the same.    REVIEW OF SYSTEMS:    Review of Systems  Constitutional: Negative for chills and fever.  HENT: Negative for congestion and tinnitus.   Eyes: Negative for blurred vision and double vision.  Respiratory: Negative for cough, shortness of breath and wheezing.   Cardiovascular: Negative for chest pain, orthopnea and PND.  Gastrointestinal: Negative for abdominal pain, diarrhea, nausea and vomiting.  Genitourinary: Negative for dysuria and hematuria.  Neurological: Negative for dizziness, sensory change and focal weakness.  All other systems reviewed and are negative.   Nutrition: Carb control Tolerating Diet: Yes Tolerating PT: Eval noted.    DRUG ALLERGIES:  No Known Allergies  VITALS:  Blood pressure (!) 103/57, pulse 98, temperature 97.9 F (36.6 C), temperature source Oral, resp. rate 16, height 5' 10.5" (1.791 m), weight 85.3 kg (188 lb 1.6 oz), SpO2 97 %.  PHYSICAL EXAMINATION:   Physical Exam  GENERAL:  61 y.o.-year-old patient lying in bed lethargic but in NAD.  EYES: Pupils equal, round, reactive to light and accommodation. No scleral icterus. Extraocular muscles intact.  HEENT: Head atraumatic, normocephalic. Oropharynx and nasopharynx clear.  NECK:  Supple, no jugular venous distention. No thyroid enlargement, no tenderness.  LUNGS: Normal breath sounds bilaterally, no wheezing, rales, rhonchi. No use of accessory muscles of respiration.  CARDIOVASCULAR: S1, S2 normal. No murmurs, rubs, or gallops.  ABDOMEN: Soft, mild tenderness on palpation, no rebound, rigidity, nondistended. Bowel sounds present. No organomegaly or mass.  EXTREMITIES: No cyanosis, clubbing or edema b/l.    NEUROLOGIC: Cranial nerves II through XII are intact.  No focal Motor or sensory deficits b/l.  Globally weak and + Tremor. PSYCHIATRIC: The patient is alert and oriented x 3.  SKIN: No obvious rash, lesion, or ulcer.    LABORATORY PANEL:   CBC Recent Labs  Lab 03/25/17 0442  WBC 3.9  HGB 7.6*  HCT 23.3*  PLT 197   ------------------------------------------------------------------------------------------------------------------  Chemistries  Recent Labs  Lab 03/21/17 1033  03/22/17 1951  03/25/17 0442  NA 132*   < > 136   < > 137  K 5.3*   < > 3.5   < > 3.9  CL 93*   < > 105   < > 110  CO2 <7*   < > 19*   < > 19*  GLUCOSE 620*   < > 137*   < > 98  BUN 27*   < > 27*   < > 28*  CREATININE 2.58*   < > 3.30*   < > 3.28*  CALCIUM 9.1   < > 7.5*   < > 7.2*  MG  --    < > 1.7  --   --   AST 13*  --   --   --   --   ALT 8*  --   --   --   --   ALKPHOS 55  --   --   --   --   BILITOT 2.2*  --   --   --   --    < > = values in this interval not displayed.   ------------------------------------------------------------------------------------------------------------------  Cardiac Enzymes Recent Labs  Lab 03/21/17 1033  TROPONINI <0.03   ------------------------------------------------------------------------------------------------------------------  RADIOLOGY:  US Renal  Result Date: 03/24/2017 CLINICAL DATA:  Acute renal failure EXAM: RENAL / URINARY TRACT ULTRASOUND COMPLETE COMPARISON:  None. FINDINGS: Right Kidney: Length: 11.4 cm. Echogenicity within normal limits. No mass or hydronephrosis visualized. Left Kidney: Length: 11.0 cm. Echogenicity within normal limits. No mass or hydronephrosis visualized. Bladder: Appears normal for degree of bladder distention. IMPRESSION: No acute abnormality noted in the kidneys. Electronically Signed   By: Inez Catalina M.D.   On: 03/24/2017 14:07     ASSESSMENT AND PLAN:   61 year old female with past medical history of hypertension, GERD, diabetes, chronic kidney disease stage IV  who presents to the hospital due to altered mental status and noted to be in acute diabetic ketoacidosis.  1. Acute diabetic ketoacidosis-due to noncompliance. Now resolved. -No further hypoglycemic episodes. Continue low-dose Lantus and NovoLog sliding scale for now.  2. Altered mental status-secondary to acute DKA, AKI and and ETOH withdrawal.  -Much improved and patient's mental status is not back to baseline. No evidence of alcohol withdrawal presently.  3. Alcohol abuse-continue CIWA protocol. - Cont. Thiamine, Folate.   4. Acute on Chronic kidney disease stage IV - creatinine slightly improved since yesterday. Patient's urine output has improved. -Appreciate nephrology input, we'll DC IV fluids. No acute indication for hemodialysis presently.  5. Anemia of chronic disease - hg. Stable.  Likely due to CKD and will cont. To monitor.   PT evaluation noted and patient will likely need short-term rehabilitation. Awaiting insurance authorization.  All the records are reviewed and case discussed with Care Management/Social Worker. Management plans discussed with the patient, family and they are in agreement.  CODE STATUS: Full code  DVT Prophylaxis: TED's & SCD's.    TOTAL TIME TAKING CARE OF THIS PATIENT: 25 minutes.   POSSIBLE D/C IN 1-2 DAYS, DEPENDING ON CLINICAL CONDITION.   Henreitta Leber M.D on 03/25/2017 at 1:25 PM  Between 7am to 6pm - Pager - [phone]  After 6pm go to [email or web address] - Proofreader  [facility]  Office  [phone]  CC: Primary care physician; Patient, No Pcp Per

## 2017-03-25 NOTE — Progress Notes (Signed)
PHARMACIST - PHYSICIAN COMMUNICATION  DR:   Verdell Carmine  CONCERNING: IV to Oral Route Change Policy  RECOMMENDATION: This patient is receiving protonix by the intravenous route.  Based on criteria approved by the Pharmacy and Therapeutics Committee, the intravenous medication(s) is/are being converted to the equivalent oral dose form(s).   DESCRIPTION: These criteria include:  The patient is eating (either orally or via tube) and/or has been taking other orally administered medications for a least 24 hours  The patient has no evidence of active gastrointestinal bleeding or impaired GI absorption (gastrectomy, short bowel, patient on TNA or NPO).  If you have questions about this conversion, please contact the [facility], [facility] 03/25/2017 7:47 AM

## 2017-03-25 NOTE — Progress Notes (Signed)
Received report for Brandi,RN. Now assuming pnts care. Pnt resting with eyes closed. Appears comfortable. Will assess vital signs and bladder scan. Bed low and locked, call bell in reach will continue to monitor and assess. SCDs alos applied and bed alarm on.

## 2017-03-25 NOTE — Progress Notes (Signed)
0600 Bladder scan 463ml. Pnt not In and out cath as she voids. Will wait to see if pnt voids on her own this AM.

## 2017-03-25 NOTE — Progress Notes (Signed)
Pnt refused SCD. Explained the prevention of DVT. Pnt verbalized understanding but refused.

## 2017-03-25 NOTE — Progress Notes (Signed)
[facility], [place] 03/25/17  Subjective:   Patient is in better spirits today Voiding more this morning Urine output 1725 cc last 24 hours Serum creatinine slightly improved to 3.3 Her husband reports that she is eating better.  Ate a good breakfast this morning.  Objective:  Vital signs in last 24 hours:  Temp:  [97.6 F (36.4 C)-97.9 F (36.6 C)] 97.9 F (36.6 C) (12/05 1417) Pulse Rate:  [97-101] 101 (12/05 1417) Resp:  [14-16] 16 (12/05 0413) BP: (103-137)/(54-73) 105/54 (12/05 1417) SpO2:  [97 %-100 %] 99 % (12/05 1417)  Weight change:  Filed Weights   03/21/17 1104 03/21/17 1337 03/23/17 1418  Weight: 79.4 kg (175 lb) 77.8 kg (171 lb 8.3 oz) 85.3 kg (188 lb 1.6 oz)    Intake/Output:    Intake/Output Summary (Last 24 hours) at 03/25/2017 1626 Last data filed at 03/25/2017 1535 Gross per 24 hour  Intake 600 ml  Output 1575 ml  Net -975 ml     Physical Exam: General:  No acute distress, laying in the bed  HEENT  anicteric, moist oral mucous membranes  Neck  supple  Pulm/lungs  normal breathing effort, mild scattered rhonchi  CVS/Heart  regular rhythm, no rub or gallop  Abdomen:   Soft, nontender  Extremities:  No edema  Neurologic:  Alert, oriented  Skin:  No acute rashes          Basic Metabolic Panel:  Recent Labs  Lab 03/22/17 0418 03/22/17 0802 03/22/17 1218 03/22/17 1547 03/22/17 1951  03/23/17 1333 03/23/17 1621 03/23/17 2003 03/24/17 0027 03/24/17 0433 03/25/17 0442  NA 136 137 138 137 136   < > 137 138 137  --  133* 137  K 3.3* 3.6 3.5 3.4* 3.5   < > 3.8 3.8 3.7  --  3.7 3.9  CL 103 104 103 105 105   < > 105 106 108  --  107 110  CO2 23 22 22  20* 19*   < > 19* 19* 19*  --  18* 19*  GLUCOSE 162* 139* 86 107* 137*   < > 154* 76 58*  --  38* 98  BUN 29* 29* 28* 28* 27*   < > 28* 27* 28*  --  27* 28*  CREATININE 2.61* 2.87* 2.90* 2.88* 3.30*   < > 3.40* 3.32* 3.31*  --  3.39* 3.28*  CALCIUM 8.3* 8.3* 8.0*  7.8* 7.5*   < > 7.4* 7.4* 7.2*  --  7.2* 7.2*  MG 2.0 1.9 1.8 1.7 1.7  --   --   --   --   --   --   --   PHOS 1.7* 3.5 3.9 3.8 3.7   < > 4.1 3.8 3.5 3.3 3.3  --    < > = values in this interval not displayed.     CBC: Recent Labs  Lab 03/21/17 1033 03/21/17 1508 03/22/17 0418 03/22/17 1218 03/23/17 0426 03/24/17 0433 03/25/17 0442  WBC 11.5* 8.4 8.5  --  9.4 5.1 3.9  NEUTROABS 10.0*  --   --   --   --   --   --   HGB 10.1* 9.0* 7.9* 8.3* 8.1* 7.8* 7.6*  HCT 33.3* 28.4* 23.6* 24.5* 24.0* 23.2* 23.3*  MCV 109.1* 104.2* 96.2  --  96.6 97.6 98.0  PLT 284 240 228  --  198 192 197     No results found for: HEPBSAG, HEPBSAB, HEPBIGM    Microbiology:  Recent  Results (from the past 240 hour(s))  Blood Culture (routine x 2)     Status: None (Preliminary result)   Collection Time: 03/21/17 10:33 AM  Result Value Ref Range Status   Specimen Description BLOOD Blood Culture adequate volume  Final   Special Requests   Final    BOTTLES DRAWN AEROBIC AND ANAEROBIC RIGHT ANTECUBITAL   Culture NO GROWTH 4 DAYS  Final   Report Status PENDING  Incomplete  Blood Culture (routine x 2)     Status: None (Preliminary result)   Collection Time: 03/21/17 11:11 AM  Result Value Ref Range Status   Specimen Description BLOOD Blood Culture adequate volume  Final   Special Requests   Final    BOTTLES DRAWN AEROBIC AND ANAEROBIC BLOOD RIGHT HAND   Culture NO GROWTH 4 DAYS  Final   Report Status PENDING  Incomplete  MRSA PCR Screening     Status: None   Collection Time: 03/21/17  2:45 PM  Result Value Ref Range Status   MRSA by PCR NEGATIVE NEGATIVE Final    Comment:        The GeneXpert MRSA Assay (FDA approved for NASAL specimens only), is one component of a comprehensive MRSA colonization surveillance program. It is not intended to diagnose MRSA infection nor to guide or monitor treatment for MRSA infections.   Urine Culture     Status: None   Collection Time: 03/21/17  3:51 PM   Result Value Ref Range Status   Specimen Description URINE, RANDOM  Final   Special Requests NONE  Final   Culture   Final    NO GROWTH Performed at [facility], [address], [place], [place] [address]    Report Status 03/22/2017 FINAL  Final    Coagulation Studies: Recent Labs    03/23/17 1333  LABPROT 12.9  INR 0.98    Urinalysis: No results for input(s): COLORURINE, LABSPEC, PHURINE, GLUCOSEU, HGBUR, BILIRUBINUR, KETONESUR, PROTEINUR, UROBILINOGEN, NITRITE, LEUKOCYTESUR in the last 72 hours.  Invalid input(s): APPERANCEUR    Imaging: US Renal  Result Date: 03/24/2017 CLINICAL DATA:  Acute renal failure EXAM: RENAL / URINARY TRACT ULTRASOUND COMPLETE COMPARISON:  None. FINDINGS: Right Kidney: Length: 11.4 cm. Echogenicity within normal limits. No mass or hydronephrosis visualized. Left Kidney: Length: 11.0 cm. Echogenicity within normal limits. No mass or hydronephrosis visualized. Bladder: Appears normal for degree of bladder distention. IMPRESSION: No acute abnormality noted in the kidneys. Electronically Signed   By: Inez Catalina M.D.   On: 03/24/2017 14:07     Medications:    . folic acid  1 mg Oral Daily  . insulin aspart  0-5 Units Subcutaneous QHS  . insulin aspart  0-9 Units Subcutaneous TID WC  . insulin glargine  10 Units Subcutaneous QHS  . multivitamin with minerals  1 tablet Oral Daily  . pantoprazole  40 mg Oral BID  . thiamine  100 mg Oral Daily   Or  . 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   1.  Acute renal failure Baseline creatinine 1.1 from July 2017 Admission creatinine is 2.58 on December 1.   Acute renal failure is likely multifactorial from  volume depletion, DKA.  Possibility of interstitial nephritis. Electrolytes and  volume status are acceptable.  No acute indication for dialysis at present Continue IV fluid supplementation until oral intake is back to normal. Serum creatinine slightly improved today from 3.4 to 3.3 Urine output appears to be improving  2.  Chronic kidney disease stage III Baseline creatinine 1.1 from July 2017/GFR 50 Renal ultrasound shows right kidney 11 cm, left kidney 10.2 cm, no hydronephrosis or mass  3.  Diabetes type 1 with chronic kidney disease Diabetes is poorly controlled Patient used to see Dr. Frederik Pear at [facility] known hemoglobin A1c is12.3% from December 2018  4.  Proteinuria Likely related to diabetic nephropathy We will obtain a urine protein to creatinine ratio      LOS: 4 [facility] 12/5/20184:26 PM  [place], [place]

## 2017-03-26 LAB — GLUCOSE, CAPILLARY
GLUCOSE-CAPILLARY: 72 mg/dL (ref 65–99)
GLUCOSE-CAPILLARY: 210 mg/dL — AB (ref 65–99)
Glucose-Capillary: 146 mg/dL — ABNORMAL HIGH (ref 65–99)
Glucose-Capillary: 149 mg/dL — ABNORMAL HIGH (ref 65–99)

## 2017-03-26 LAB — BASIC METABOLIC PANEL
Anion gap: 8 (ref 5–15)
BUN: 28 mg/dL — ABNORMAL HIGH (ref 6–20)
CHLORIDE: 109 mmol/L (ref 101–111)
CO2: 20 mmol/L — ABNORMAL LOW (ref 22–32)
Calcium: 7.6 mg/dL — ABNORMAL LOW (ref 8.9–10.3)
Creatinine, Ser: 3.21 mg/dL — ABNORMAL HIGH (ref 0.44–1.00)
GFR calc non Af Amer: 14 mL/min — ABNORMAL LOW (ref >60)
GFR, EST AFRICAN AMERICAN: 17 mL/min — AB (ref >60)
Glucose, Bld: 89 mg/dL (ref 65–99)
POTASSIUM: 3.8 mmol/L (ref 3.5–5.1)
SODIUM: 137 mmol/L (ref 135–145)

## 2017-03-26 LAB — CULTURE, BLOOD (ROUTINE X 2)
Culture: NO GROWTH
Culture: NO GROWTH
SPECIMEN DESCRIPTION: ADEQUATE
SPECIMEN DESCRIPTION: ADEQUATE

## 2017-03-26 MED ORDER — INSULIN GLARGINE 100 UNIT/ML ~~LOC~~ SOLN
8.0000 [IU] | Freq: Every day | SUBCUTANEOUS | Status: DC
  Administered 2017-03-26: 21:00:00 8 [IU] via SUBCUTANEOUS
  Filled 2017-03-26 (×2): qty 0.08

## 2017-03-26 MED ORDER — SODIUM CHLORIDE 0.9 % IV BOLUS (SEPSIS)
1000.0000 mL | Freq: Once | INTRAVENOUS | Status: AC
  Administered 2017-03-26: 1000 mL via INTRAVENOUS

## 2017-03-26 NOTE — Care Management (Signed)
Voice mail from [employer] at [employer] [facility]. If she can help with discharge plans call [phone]. Shelbie Ammons RN MSN CCM Care Management [phone]

## 2017-03-26 NOTE — Progress Notes (Signed)
[facility], [place] 03/26/17  Subjective:   Patient and her husband report that she became orthostatic when she tried to get up with physical therapist Blood pressure dropped to 80 systolic.  Increase back up to 107 when she laid down.  She was prescribed 1 L of normal saline bolus.  Serum creatinine from this morning has improved to 3.21 slightly. Potassium is within normal range Her husband reports that she is trying to eat more.  No shortness of breath  Objective:  Vital signs in last 24 hours:  Temp:  [97.9 F (36.6 C)-98.2 F (36.8 C)] 98.2 F (36.8 C) (12/06 1500) Pulse Rate:  [103-109] 103 (12/06 1527) Resp:  [13-18] 18 (12/06 1500) BP: (107-127)/(49-77) 127/72 (12/06 1527) SpO2:  [94 %-100 %] 100 % (12/06 1527)  Weight change:  Filed Weights   03/21/17 1104 03/21/17 1337 03/23/17 1418  Weight: 79.4 kg (175 lb) 77.8 kg (171 lb 8.3 oz) 85.3 kg (188 lb 1.6 oz)    Intake/Output:    Intake/Output Summary (Last 24 hours) at 03/26/2017 1617 Last data filed at 03/26/2017 1028 Gross per 24 hour  Intake 600 ml  Output 1650 ml  Net -1050 ml     Physical Exam: General:  No acute distress, laying in the bed  HEENT  anicteric, moist oral mucous membranes  Neck  supple  Pulm/lungs  normal breathing effort, mild scattered rhonchi  CVS/Heart  regular rhythm, no rub or gallop  Abdomen:   Soft, nontender  Extremities:  No edema  Neurologic:  Alert, oriented  Skin:  No acute rashes          Basic Metabolic Panel:  Recent Labs  Lab 03/22/17 0418 03/22/17 0802 03/22/17 1218 03/22/17 1547 03/22/17 1951  03/23/17 1333 03/23/17 1621 03/23/17 2003 03/24/17 0027 03/24/17 0433 03/25/17 0442 03/26/17 0448  NA 136 137 138 137 136   < > 137 138 137  --  133* 137 137  K 3.3* 3.6 3.5 3.4* 3.5   < > 3.8 3.8 3.7  --  3.7 3.9 3.8  CL 103 104 103 105 105   < > 105 106 108  --  107 110 109  CO2 23 22 22  20* 19*   < > 19* 19* 19*  --  18* 19* 20*   GLUCOSE 162* 139* 86 107* 137*   < > 154* 76 58*  --  38* 98 89  BUN 29* 29* 28* 28* 27*   < > 28* 27* 28*  --  27* 28* 28*  CREATININE 2.61* 2.87* 2.90* 2.88* 3.30*   < > 3.40* 3.32* 3.31*  --  3.39* 3.28* 3.21*  CALCIUM 8.3* 8.3* 8.0* 7.8* 7.5*   < > 7.4* 7.4* 7.2*  --  7.2* 7.2* 7.6*  MG 2.0 1.9 1.8 1.7 1.7  --   --   --   --   --   --   --   --   PHOS 1.7* 3.5 3.9 3.8 3.7   < > 4.1 3.8 3.5 3.3 3.3  --   --    < > = values in this interval not displayed.     CBC: Recent Labs  Lab 03/21/17 1033 03/21/17 1508 03/22/17 0418 03/22/17 1218 03/23/17 0426 03/24/17 0433 03/25/17 0442  WBC 11.5* 8.4 8.5  --  9.4 5.1 3.9  NEUTROABS 10.0*  --   --   --   --   --   --   HGB 10.1* 9.0*  7.9* 8.3* 8.1* 7.8* 7.6*  HCT 33.3* 28.4* 23.6* 24.5* 24.0* 23.2* 23.3*  MCV 109.1* 104.2* 96.2  --  96.6 97.6 98.0  PLT 284 240 228  --  198 192 197     No results found for: HEPBSAG, HEPBSAB, HEPBIGM    Microbiology:  Recent Results (from the past 240 hour(s))  Blood Culture (routine x 2)     Status: None   Collection Time: 03/21/17 10:33 AM  Result Value Ref Range Status   Specimen Description BLOOD Blood Culture adequate volume  Final   Special Requests   Final    BOTTLES DRAWN AEROBIC AND ANAEROBIC RIGHT ANTECUBITAL   Culture NO GROWTH 5 DAYS  Final   Report Status 03/26/2017 FINAL  Final  Blood Culture (routine x 2)     Status: None   Collection Time: 03/21/17 11:11 AM  Result Value Ref Range Status   Specimen Description BLOOD Blood Culture adequate volume  Final   Special Requests   Final    BOTTLES DRAWN AEROBIC AND ANAEROBIC BLOOD RIGHT HAND   Culture NO GROWTH 5 DAYS  Final   Report Status 03/26/2017 FINAL  Final  MRSA PCR Screening     Status: None   Collection Time: 03/21/17  2:45 PM  Result Value Ref Range Status   MRSA by PCR NEGATIVE NEGATIVE Final    Comment:        The GeneXpert MRSA Assay (FDA approved for NASAL specimens only), is one component of a comprehensive  MRSA colonization surveillance program. It is not intended to diagnose MRSA infection nor to guide or monitor treatment for MRSA infections.   Urine Culture     Status: None   Collection Time: 03/21/17  3:51 PM  Result Value Ref Range Status   Specimen Description URINE, RANDOM  Final   Special Requests NONE  Final   Culture   Final    NO GROWTH Performed at [facility], [address], [place], [place] [address]    Report Status 03/22/2017 FINAL  Final    Coagulation Studies: No results for input(s): LABPROT, INR in the last 72 hours.  Urinalysis: No results for input(s): COLORURINE, LABSPEC, PHURINE, GLUCOSEU, HGBUR, BILIRUBINUR, KETONESUR, PROTEINUR, UROBILINOGEN, NITRITE, LEUKOCYTESUR in the last 72 hours.  Invalid input(s): APPERANCEUR    Imaging: No results found.   Medications:    . folic acid  1 mg Oral Daily  . insulin aspart  0-5 Units Subcutaneous QHS  . insulin aspart  0-9 Units Subcutaneous TID WC  . insulin glargine  8 Units Subcutaneous QHS  . multivitamin with minerals  1 tablet Oral Daily  . pantoprazole  40 mg Oral BID  . thiamine  100 mg Oral Daily   Or  . 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   1.  Acute renal failure Baseline creatinine 1.1 from July 2017 Admission creatinine is 2.58 on December 1.   Acute renal failure is likely multifactorial from volume depletion, DKA.  Possibility of interstitial nephritis.  Continue IV fluid supplementation until oral intake is back to normal. Serum creatinine slightly improved today from 3.4 to 3.3 to 3.2 Urine output appears to be improving Electrolytes and volume status are acceptable.  No acute indication  for dialysis at present We will arrange for outpatient follow-up.  It is okay to discharge from renal standpoint  2.  Chronic kidney disease stage III Baseline creatinine 1.1 from July 2017/GFR 50 Renal ultrasound shows right kidney 11 cm, left kidney 10.2 cm, no hydronephrosis or mass  3.  Diabetes type 1 with chronic kidney disease Diabetes is poorly controlled Patient used to see Dr. Frederik Pear at [facility] known hemoglobin A1c is12.3% from December 2018  4.  Proteinuria Likely related to diabetic nephropathy We will obtain a urine protein to creatinine ratio  [facility]/ Nephrology follow-up appointment: December 14 Friday at 10:40 AM.    LOS: 5 Estrella Alcaraz 12/6/20184:17 PM  [facility] [place], [place] Antonia

## 2017-03-26 NOTE — Progress Notes (Signed)
[facility] NAME: Laurie Lin    MR#:  [id number]  DATE OF BIRTH:  02/13/1956  SUBJECTIVE:   No acute events overnight.  No complaints presently. Awaiting insurance authorization for short-term rehabilitation placement.    REVIEW OF SYSTEMS:    Review of Systems  Constitutional: Negative for chills and fever.  HENT: Negative for congestion and tinnitus.   Eyes: Negative for blurred vision and double vision.  Respiratory: Negative for cough, shortness of breath and wheezing.   Cardiovascular: Negative for chest pain, orthopnea and PND.  Gastrointestinal: Negative for abdominal pain, diarrhea, nausea and vomiting.  Genitourinary: Negative for dysuria and hematuria.  Neurological: Negative for dizziness, sensory change and focal weakness.  All other systems reviewed and are negative.   Nutrition: Carb control Tolerating Diet: Yes Tolerating PT: Eval noted.    DRUG ALLERGIES:  No Known Allergies  VITALS:  Blood pressure (!) 107/59, pulse (!) 109, temperature 98 F (36.7 C), temperature source Oral, resp. rate 18, height 5' 10.5" (1.791 m), weight 85.3 kg (188 lb 1.6 oz), SpO2 94 %.  PHYSICAL EXAMINATION:   Physical Exam  GENERAL:  61 y.o.-year-old patient lying in bed lethargic but in NAD.  EYES: Pupils equal, round, reactive to light and accommodation. No scleral icterus. Extraocular muscles intact.  HEENT: Head atraumatic, normocephalic. Oropharynx and nasopharynx clear.  NECK:  Supple, no jugular venous distention. No thyroid enlargement, no tenderness.  LUNGS: Normal breath sounds bilaterally, no wheezing, rales, rhonchi. No use of accessory muscles of respiration.  CARDIOVASCULAR: S1, S2 normal. No murmurs, rubs, or gallops.  ABDOMEN: Soft, mild tenderness on palpation, no rebound, rigidity, nondistended. Bowel sounds present. No organomegaly or mass.  EXTREMITIES: No cyanosis, clubbing or edema b/l.    NEUROLOGIC:  Cranial nerves II through XII are intact. No focal Motor or sensory deficits b/l.  Globally weak and + Tremor. PSYCHIATRIC: The patient is alert and oriented x 3.  SKIN: No obvious rash, lesion, or ulcer.    LABORATORY PANEL:   CBC Recent Labs  Lab 03/25/17 0442  WBC 3.9  HGB 7.6*  HCT 23.3*  PLT 197   ------------------------------------------------------------------------------------------------------------------  Chemistries  Recent Labs  Lab 03/21/17 1033  03/22/17 1951  03/26/17 0448  NA 132*   < > 136   < > 137  K 5.3*   < > 3.5   < > 3.8  CL 93*   < > 105   < > 109  CO2 <7*   < > 19*   < > 20*  GLUCOSE 620*   < > 137*   < > 89  BUN 27*   < > 27*   < > 28*  CREATININE 2.58*   < > 3.30*   < > 3.21*  CALCIUM 9.1   < > 7.5*   < > 7.6*  MG  --    < > 1.7  --   --   AST 13*  --   --   --   --   ALT 8*  --   --   --   --   ALKPHOS 55  --   --   --   --   BILITOT 2.2*  --   --   --   --    < > = values in this interval not displayed.   ------------------------------------------------------------------------------------------------------------------  Cardiac Enzymes Recent Labs  Lab 03/21/17 1033  TROPONINI <0.03   ------------------------------------------------------------------------------------------------------------------  RADIOLOGY:  US Renal  Result Date: 03/24/2017 CLINICAL DATA:  Acute renal failure EXAM: RENAL / URINARY TRACT ULTRASOUND COMPLETE COMPARISON:  None. FINDINGS: Right Kidney: Length: 11.4 cm. Echogenicity within normal limits. No mass or hydronephrosis visualized. Left Kidney: Length: 11.0 cm. Echogenicity within normal limits. No mass or hydronephrosis visualized. Bladder: Appears normal for degree of bladder distention. IMPRESSION: No acute abnormality noted in the kidneys. Electronically Signed   By: Inez Catalina M.D.   On: 03/24/2017 14:07     ASSESSMENT AND PLAN:   61 year old female with past medical history of hypertension, GERD,  diabetes, chronic kidney disease stage IV who presents to the hospital due to altered mental status and noted to be in acute diabetic ketoacidosis.  1. Acute diabetic ketoacidosis-due to noncompliance. Now resolved. -No further hypoglycemic episodes. Continue low-dose Lantus and NovoLog sliding scale for now and BS stable.   2. Altered mental status-secondary to acute DKA, AKI and and ETOH withdrawal.  -Much improved and patient's mental status is not back to baseline. No evidence of alcohol withdrawal presently.  3. Alcohol abuse-continue CIWA protocol. - Cont. Thiamine, Folate.   4. Acute on Chronic kidney disease stage IV -Cr close to baseline and will cont. To monitor.  Urine output fair. -Appreciate nephrology input and cont. Current care.   5. Anemia of chronic disease - hg. Stable.  Likely due to CKD and will cont. To monitor.   6. Orthostatic hypotension-patient got dizzy and systolic blood pressure dropped when working with physical therapy today. Given IV fluid bolus and will continue to monitor.  PT evaluation noted and patient will likely need short-term rehabilitation. Awaiting insurance authorization.  All the records are reviewed and case discussed with Care Management/Social Worker. Management plans discussed with the patient, family and they are in agreement.  CODE STATUS: Full code  DVT Prophylaxis: TED's & SCD's.    TOTAL TIME TAKING CARE OF THIS PATIENT: 25 minutes.   POSSIBLE D/C IN 1-2 DAYS, DEPENDING ON CLINICAL CONDITION.   Henreitta Leber M.D on 03/26/2017 at 1:00 PM  Between 7am to 6pm - Pager - [phone]  After 6pm go to [email or web address] - Proofreader  [facility]  Office  [phone]  CC: Primary care physician; Patient, No Pcp Per

## 2017-03-26 NOTE — Progress Notes (Signed)
Physical Therapy Treatment Patient Details Name: Laurie Lin MRN: [id number] DOB: 1955-09-26 Today's Date: 03/26/2017    History of Present Illness Pt presented to ER secondary to progressive weakness, AMS; admitted for management of DKA, hypotension.    PT Comments    Pt requiring mod assist for bed mobility, min assist to stand with RW, and min to mod assist to transfer and ambulate a couple feet with RW; limited ambulation d/t pt c/o significant dizziness and needing to sit down.  Sitting in recliner pt's BP noted to be 80/46 (HR 117 bpm) and pt continued to c/o dizziness.  Pt assisted back to bed with LE's elevated and BP improved to 107/59 (HR 100 bpm).  Nursing notified immediately regarding pt's BP and was in pt's room end of session.    Follow Up Recommendations  SNF     Equipment Recommendations  Rolling walker with 5" wheels    Recommendations for Other Services       Precautions / Restrictions Precautions Precautions: Fall Restrictions Weight Bearing Restrictions: No    Mobility  Bed Mobility Overal bed mobility: Needs Assistance Bed Mobility: Supine to Sit;Sit to Supine     Supine to sit: Mod assist;HOB elevated Sit to supine: Mod assist;HOB elevated   General bed mobility comments: assist for trunk supine to/from sit with HOB elevated; increased effort and time to perform  Transfers Overall transfer level: Needs assistance Equipment used: Rolling walker (2 wheeled) Transfers: Sit to/from Stand Sit to Stand: Min assist  Stand step turn bed to/from recliner: min to mod assist x1 with RW       General transfer comment: assist to initate stand from bed x1 trial and recliner x2 trials; increased effort to stand; vc's for B UE placement with transfers and use of RW  Ambulation/Gait Ambulation/Gait assistance: Min assist;Mod assist;+2 safety/equipment Ambulation Distance (Feet): 2 Feet Assistive device: Rolling walker (2 wheeled)   Gait velocity:  decreased   General Gait Details: decreased B step length/foot clearance; limited d/t pt c/o significant dizziness in standing   Stairs            Wheelchair Mobility    Modified Rankin (Stroke Patients Only)       Balance Overall balance assessment: Needs assistance Sitting-balance support: No upper extremity supported;Feet supported Sitting balance-Leahy Scale: Fair     Standing balance support: Bilateral upper extremity supported(on RW) Standing balance-Leahy Scale: Poor Standing balance comment: Requires B UE support on RW for static balance                            Cognition Arousal/Alertness: Awake/alert Behavior During Therapy: WFL for tasks assessed/performed Overall Cognitive Status: Impaired/Different from baseline                                 General Comments: oriented to self, location, general situation; delay in processing and task comprehension      Exercises      General Comments General comments (skin integrity, edema, etc.): Pt resting in bed upon PT arrival and pt perseverating on her husband coming to see her.  Nursing cleared pt for participation in physical therapy.  Pt agreeable to PT session.      Pertinent Vitals/Pain Pain Assessment: No/denies pain    Home Living  Prior Function            PT Goals (current goals can now be found in the care plan section) Acute Rehab PT Goals Patient Stated Goal: to return home PT Goal Formulation: With patient/family Time For Goal Achievement: 04/07/17 Potential to Achieve Goals: Good Progress towards PT goals: Progressing toward goals    Frequency    Min 2X/week      PT Plan Current plan remains appropriate    Co-evaluation              AM-PAC PT "6 Clicks" Daily Activity  Outcome Measure  Difficulty turning over in bed (including adjusting bedclothes, sheets and blankets)?: Unable Difficulty moving from lying on  back to sitting on the side of the bed? : Unable Difficulty sitting down on and standing up from a chair with arms (e.g., wheelchair, bedside commode, etc,.)?: Unable Help needed moving to and from a bed to chair (including a wheelchair)?: A Little Help needed walking in hospital room?: A Lot Help needed climbing 3-5 steps with a railing? : Total 6 Click Score: 9    End of Session Equipment Utilized During Treatment: Gait belt Activity Tolerance: Treatment limited secondary to medical complications (Comment)(Low BP (nursing notified)) Patient left: in bed;with call bell/phone within reach;with bed alarm set;with nursing/sitter in room Nurse Communication: Mobility status PT Visit Diagnosis: Repeated falls (R29.6);Unsteadiness on feet (R26.81);Muscle weakness (generalized) (M62.81);Difficulty in walking, not elsewhere classified (R26.2)     Time: 8270-7867 PT Time Calculation (min) (ACUTE ONLY): 42 min  Charges:  $Gait Training: 8-22 mins $Therapeutic Exercise: 8-22 mins $Therapeutic Activity: 8-22 mins                    G CodesLeitha Bleak, PT 03/26/17, 10:27 AM [phone]

## 2017-03-26 NOTE — Progress Notes (Signed)
Dr Candiss Norse requested to change rate of bolus to 256ml/hr. Rate changed per order.

## 2017-03-26 NOTE — Plan of Care (Signed)
Nutrition- Pt remains to eat poorly. Anxiety levels- were increased this evening following a phone conversation with someone the pt. Identified as her husband. Able to actively listen to pt. And provided pt with extra pillows to assist in decreasing anxiety levels. Pt. Continues to progress in other car plan areas.

## 2017-03-26 NOTE — Progress Notes (Signed)
Per physical therapist pt c/o dizziness when up to the chair. BP checked and was 80/46, HR 117. Currently pt in bed.BP 107/59, HR 109. Pt denies dizziness. Notified Dr Verdell Carmine of the above. Per MD to give 1L bolus and recheck BP.

## 2017-03-26 NOTE — Progress Notes (Signed)
Inpatient Diabetes Program Recommendations  AACE/ADA: New Consensus Statement on Inpatient Glycemic Control (2015)  Target Ranges:  Prepandial:   less than 140 mg/dL      [facility] postprandial:   less than 180 mg/dL (1-2 hours)      Critically ill patients:  140 - 180 mg/dL   Lab Results  Component Value Date   GLUCAP 72 03/26/2017   HGBA1C 12.3 (H) 03/23/2017    Review of Glycemic Control  Results for Laurie Lin, Laurie Lin (MRN [id number]) as of 03/26/2017 08:21  Ref. Range 03/25/2017 08:27 03/25/2017 11:59 03/25/2017 16:59 03/25/2017 20:45 03/26/2017 07:25  Glucose-Capillary Latest Ref Range: 65 - 99 mg/dL 77 102 (H) 157 (H) 127 (H) 72   Diabetes history: DM1 Outpatient Diabetes medications: Lantus 12 units QPM and Novolog 1 unit for every 15 grams of carbs and Novolog 1 unit for every 20 mg/dl above target glucose  Current orders for Inpatient glycemic control: Novolog 0-9 units TID with meals, Novolog 0-5 units QHS, Lantus 10 units qhs  Inpatient Diabetes Program Recommendations: lease consider ordering Lantus 9 units Q24H. (fasting CBG 77 yesterday, 72 this am)  Patient would like to be referred to a local Endocrinologist for follow up regarding DM management.   Gentry Fitz, RN, BA, MHA, CDE Diabetes Coordinator Inpatient Diabetes Program  [phone] (Team Pager) [phone] ([place]) 03/26/2017 8:23 AM

## 2017-03-26 NOTE — Progress Notes (Signed)
Sister, daughters, and husband were told patient was to have a psychiatric evaluation prior to discharge. Sister stated that this was discussed in the ER and ICU with Dr. Humphrey Rolls. They would like this done prior to patient leaving.

## 2017-03-27 LAB — BASIC METABOLIC PANEL
Anion gap: 9 (ref 5–15)
BUN: 27 mg/dL — AB (ref 6–20)
CHLORIDE: 107 mmol/L (ref 101–111)
CO2: 21 mmol/L — AB (ref 22–32)
CREATININE: 3.09 mg/dL — AB (ref 0.44–1.00)
Calcium: 7.5 mg/dL — ABNORMAL LOW (ref 8.9–10.3)
GFR calc Af Amer: 18 mL/min — ABNORMAL LOW (ref >60)
GFR calc non Af Amer: 15 mL/min — ABNORMAL LOW (ref >60)
Glucose, Bld: 57 mg/dL — ABNORMAL LOW (ref 65–99)
Potassium: 3.6 mmol/L (ref 3.5–5.1)
Sodium: 137 mmol/L (ref 135–145)

## 2017-03-27 LAB — GLUCOSE, CAPILLARY
Glucose-Capillary: 50 mg/dL — ABNORMAL LOW (ref 65–99)
Glucose-Capillary: 64 mg/dL — ABNORMAL LOW (ref 65–99)
Glucose-Capillary: 86 mg/dL (ref 65–99)
Glucose-Capillary: 118 mg/dL — ABNORMAL HIGH (ref 65–99)

## 2017-03-27 MED ORDER — FOLIC ACID 1 MG PO TABS: 1.0000 mg | ORAL_TABLET | Freq: Every day | ORAL | Status: DC

## 2017-03-27 MED ORDER — INSULIN GLARGINE 100 UNIT/ML ~~LOC~~ SOLN: 8.0000 [IU] | Freq: Every day | SUBCUTANEOUS | 11 refills | Status: DC

## 2017-03-27 MED ORDER — INSULIN GLARGINE 100 UNIT/ML ~~LOC~~ SOLN: 5.0000 [IU] | Freq: Every day | SUBCUTANEOUS | 11 refills | Status: DC

## 2017-03-27 MED ORDER — ADULT MULTIVITAMIN W/MINERALS CH
1.0000 | ORAL_TABLET | Freq: Every day | ORAL | Status: DC
Start: 2017-03-28 — End: 2022-05-12

## 2017-03-27 MED ORDER — BISACODYL 10 MG RE SUPP: 10.0000 mg | Freq: Once | RECTAL | Status: DC

## 2017-03-27 MED ORDER — INSULIN ASPART 100 UNIT/ML ~~LOC~~ SOLN: 0.0000 [IU] | Freq: Three times a day (TID) | SUBCUTANEOUS | 11 refills | Status: DC

## 2017-03-27 MED ORDER — INSULIN GLARGINE 100 UNIT/ML ~~LOC~~ SOLN: 7.0000 [IU] | Freq: Every day | SUBCUTANEOUS | 11 refills | Status: DC

## 2017-03-27 NOTE — Progress Notes (Signed)
Hypoglycemic Event  Time: 0745     CBG: 50  Treatment: 4 oz orange juice  Symptoms: drowsiness  Follow-up CBG: Time: 0815 CBG Result: 64  Treatment: 4 oz orange juice  Follow-up CBG: Time: 4299   CBG Result: 86  Patient now eating breakfast.   Madlyn Frankel, RN

## 2017-03-27 NOTE — Progress Notes (Signed)
Report called to Belinda at [employer]. Madlyn Frankel, RN

## 2017-03-27 NOTE — Progress Notes (Signed)
Inpatient Diabetes Program Recommendations  AACE/ADA: New Consensus Statement on Inpatient Glycemic Control (2015)  Target Ranges:  Prepandial:   less than 140 mg/dL      [facility] postprandial:   less than 180 mg/dL (1-2 hours)      Critically ill patients:  140 - 180 mg/dL   Lab Results  Component Value Date   GLUCAP 86 03/27/2017   HGBA1C 12.3 (H) 03/23/2017    Review of Glycemic ControlResults for Laurie Lin, Laurie Lin (MRN [id number]) as of 03/27/2017 10:25  Ref. Range 03/26/2017 07:25 03/26/2017 11:49 03/26/2017 16:42 03/26/2017 20:41 03/27/2017 07:45 03/27/2017 08:15 03/27/2017 08:41  Glucose-Capillary Latest Ref Range: 65 - 99 mg/dL 72 146 (H) 210 (H) 149 (H) 50 (L) 64 (L) 86    Diabetes history: Type 1 DM Outpatient Diabetes medications:  Lantus 12 units QPM and Novolog 1 unit for every 15 grams of carbs and Novolog 1 unit for every 20 mg/dl above target glucose (of note, Dr. Jimmy Picket note from [facility] Endocrinology notes that patient was on 70/30 insulin with meals?)  Current orders for Inpatient glycemic control:  Lantus 8 units q HS, Novolog sensitive tid with meals and HS  Inpatient Diabetes Program Recommendations:  Patient hypoglycemic again this morning.  Of note, it appears that patient is eating very little. Patient only received 4 units of Novolog on 12/6 and 8 units of Lantus last PM, and blood sugars still low this morning.  Due to history of Type 1 DM, she needs a portion of basal insulin.  Consider reducing Lantus to 7 units q HS.  Also, consider reducing Novolog correction to custom scale starting at 151 mg/dL. (151-200 mg/dL-1 unit, 201-250 mg/dL-2 units, 251-300 mg/dL-3 units, 301-350 mg/dL-4 units, 351-400 mg/dL -5 units and >400 mg/dL 6 units and call MD). Once intake>50% of meals, consider adding Novolog 2 units tid with meals.   Patients insulin needs are very low at this time.  Needs to see endocrinologist to evaluate insulin needs and extreme sensitivity to  insulin/hypoglycemia.   Thanks, Adah Perl, RN, BC-ADM Inpatient Diabetes Coordinator Pager [phone]

## 2017-03-27 NOTE — Progress Notes (Signed)
Patient is medically stable for D/C to [facility] today. [employer] SNF authorization has been received. Per [facility] liaison patient can come today to room 804. RN will call report to RN Yaakov Guthrie at [phone] and arrange EMS for transport. Clinical Education officer, museum (CSW) sent D/C orders to [facility] via [facility]. Patient is aware of above. Patient's husband [name] is at bedside and aware of above. Please reconsult if future social work needs arise. CSW signing off.   [employer], LCSW [phone]

## 2017-03-27 NOTE — Clinical Social Work Placement (Signed)
   CLINICAL SOCIAL WORK PLACEMENT  NOTE  Date:  03/27/2017  Patient Details  Name: Laurie Lin MRN: [id number] Date of Birth: Nov 01, 1955  Clinical Social Work is seeking post-discharge placement for this patient at the [place] level of care (*CSW will initial, date and re-position this form in  chart as items are completed):  Yes   Patient/family provided with [place] Work Department's list of facilities offering this level of care within the geographic area requested by the patient (or if unable, by the patient's family).  Yes   Patient/family informed of their freedom to choose among providers that offer the needed level of care, that participate in Medicare, Medicaid or managed care program needed by the patient, have an available bed and are willing to accept the patient.  Yes   Patient/family informed of [facility]'s ownership interest in [facility] and [facility], as well as of the fact that they are under no obligation to receive care at these facilities.  PASRR submitted to EDS on 03/24/17     PASRR number received on 03/24/17     Existing PASRR number confirmed on       FL2 transmitted to all facilities in geographic area requested by pt/family on 03/24/17     FL2 transmitted to all facilities within larger geographic area on       Patient informed that his/her managed care company has contracts with or will negotiate with certain facilities, including the following:        Yes   Patient/family informed of bed offers received.  Patient chooses bed at ([facility] )     Physician recommends and patient chooses bed at      Patient to be transferred to ([facility] ) on 03/27/17.  Patient to be transferred to facility by [facility] )     Patient family notified on 03/27/17 of transfer.  Name of family member notified:  (Patient's husband [name] is at bedside and aware of D/C today. )     PHYSICIAN       Additional  Comment:    _______________________________________________ Sierra Spargo, Veronia Beets, LCSW 03/27/2017, 12:27 PM

## 2017-03-27 NOTE — Progress Notes (Signed)
EMS called for transport. Annalicia Renfrew S, RN  

## 2017-03-27 NOTE — Progress Notes (Signed)
Patient discharged via EMS. Marzell Allemand S, RN  

## 2017-03-27 NOTE — Discharge Summary (Signed)
[facility] [place] NAME: Laurie Lin    MR#:  [id number]  DATE OF BIRTH:  05-04-1955  DATE OF ADMISSION:  03/21/2017 ADMITTING PHYSICIAN: Dustin Flock, MD  DATE OF DISCHARGE: 03/27/2017  PRIMARY CARE PHYSICIAN: Patient, No Pcp Per    ADMISSION DIAGNOSIS:  Metabolic acidosis [H88.5] AKI (acute kidney injury) ([place]) [N17.9] Hypothermia, initial encounter [T68.XXXA] Type 1 diabetes mellitus with ketoacidosis without coma ([place]) [E10.10]  DISCHARGE DIAGNOSIS:  Active Problems:   DKA (diabetic ketoacidoses) ([place])   SECONDARY DIAGNOSIS:   Past Medical History:  Diagnosis Date  . Diabetes mellitus without complication ([place])   . GERD (gastroesophageal reflux disease)   . Hypertension   . Renal disorder    stage 3     HOSPITAL COURSE:   61 year old female with past medical history of hypertension, GERD, diabetes, chronic kidney disease stage IV who presents to the hospital due to altered mental status and noted to be in acute diabetic ketoacidosis.  1. Acute diabetic ketoacidosis- patient presented with acute diabetic ketoacidosis secondary to noncompliance. -Initially patient was admitted to the ICU and started on insulin drip, IV fluids.  After aggressive therapy patient's anion gap closed and her DKA resolved. -Diabetes coordinator consult was obtained.  Patient was taken off the insulin drip and placed on low-dose Lantus with a NovoLog sliding scale.  She had some intermittent episodes of hypoglycemia which have improved.  Patient presently will be discharged on small dose of Lantus at bedtime along with a NovoLog sliding scale.  Further titrations to her diabetic meds can be done as an outpatient.  2. Altered mental status-secondary to acute DKA, AKI and and ETOH withdrawal.  -As patient's DKA has improved, her mental status has also improved and is close to baseline.  She did have some episodes of alcohol withdrawal for which she  was treated with some Ativan and currently is not having any evidence of alcohol withdrawal.  3. Alcohol abuse- patient was placed on CIWA protocol.  She currently has no signs of alcohol withdrawal and is clinically stable.  She will continued folate supplements.  4. Acute on Chronic kidney disease stage IV -this was secondary to poor p.o. intake.  Patient was gently hydrated with IV fluids and her creatinine has improved and is currently close to baseline.  She was seen by nephrology and they will follow-up with her as an outpatient next few weeks.  She has no acute indication of dialysis.  5. Anemia of chronic disease -patient's hemoglobin remains stable, this was secondary to her chronic kidney disease and this can be further followed as an outpatient.  6. Orthostatic hypotension- this has improved and resolved with IV fluid hydration and she is no longer orthostatic or symptomatic.  She was seen by physical therapy and they recommended short-term rehab which is where she is presently being discharged.   DISCHARGE CONDITIONS:   Stable  CONSULTS OBTAINED:  Treatment Team:  Nettie Elm, MD Alexis Goodell, MD Murlean Iba, MD  DRUG ALLERGIES:  No Known Allergies  DISCHARGE MEDICATIONS:   Allergies as of 03/27/2017   No Known Allergies     Medication List    TAKE these medications   esomeprazole 40 MG capsule Commonly known as:  NEXIUM Take 40 mg by mouth 2 (two) times daily.   folic acid 1 MG tablet Commonly known as:  FOLVITE Take 1 tablet (1 mg total) by mouth daily. Start taking on:  03/28/2017   furosemide 20  MG tablet Commonly known as:  LASIX Take 20 mg by mouth daily.   insulin aspart 100 UNIT/ML injection Commonly known as:  novoLOG Inject 0-9 Units into the skin 3 (three) times daily with meals. BS 70-120 - 0 units       121-150 - 1 units       151-200 - 2 units        201-250 - 3 units        251-300 - 4 units        301-350 - 5 units         351-400 - 6 units        > 400 call md.   insulin glargine 100 UNIT/ML injection Commonly known as:  LANTUS Inject 0.07 mLs (7 Units total) into the skin at bedtime.   metoprolol tartrate 25 MG tablet Commonly known as:  LOPRESSOR Take 25 mg by mouth 2 (two) times daily.   multivitamin with minerals Tabs tablet Take 1 tablet by mouth daily. Start taking on:  03/28/2017         DISCHARGE INSTRUCTIONS:   DIET:  Diabetic diet  DISCHARGE CONDITION:  Stable  ACTIVITY:  Activity as tolerated  OXYGEN:  Home Oxygen: No.   Oxygen Delivery: room air  DISCHARGE LOCATION:  nursing home   If you experience worsening of your admission symptoms, develop shortness of breath, life threatening emergency, suicidal or homicidal thoughts you must seek medical attention immediately by calling 911 or calling your MD immediately  if symptoms less severe.  You Must read complete instructions/literature along with all the possible adverse reactions/side effects for all the Medicines you take and that have been prescribed to you. Take any new Medicines after you have completely understood and accpet all the possible adverse reactions/side effects.   Please note  You were cared for by a hospitalist during your hospital stay. If you have any questions about your discharge medications or the care you received while you were in the hospital after you are discharged, you can call the unit and asked to speak with the hospitalist on call if the hospitalist that took care of you is not available. Once you are discharged, your primary care physician will handle any further medical issues. Please note that NO REFILLS for any discharge medications will be authorized once you are discharged, as it is imperative that you return to your primary care physician (or establish a relationship with a primary care physician if you do not have one) for your aftercare needs so that they can reassess your need for  medications and monitor your lab values.     Today   No acute events overnight.  No symptoms of ETOH withdrawal.  No other complaints.    VITAL SIGNS:  Blood pressure (!) 108/54, pulse 93, temperature 97.8 F (36.6 C), temperature source Oral, resp. rate 16, height 5' 10.5" (1.791 m), weight 85.3 kg (188 lb 1.6 oz), SpO2 94 %.  I/O:  No intake or output data in the 24 hours ending 03/27/17 1207  PHYSICAL EXAMINATION:    GENERAL:  61 y.o.-year-old patient lying in bed lethargic but in NAD.  EYES: Pupils equal, round, reactive to light and accommodation. No scleral icterus. Extraocular muscles intact.  HEENT: Head atraumatic, normocephalic. Oropharynx and nasopharynx clear.  NECK:  Supple, no jugular venous distention. No thyroid enlargement, no tenderness.  LUNGS: Normal breath sounds bilaterally, no wheezing, rales, rhonchi. No use of accessory muscles of respiration.  CARDIOVASCULAR: S1,  S2 normal. No murmurs, rubs, or gallops.  ABDOMEN: Soft, mild tenderness on palpation, no rebound, rigidity, nondistended. Bowel sounds present. No organomegaly or mass.  EXTREMITIES: No cyanosis, clubbing or edema b/l.    NEUROLOGIC: Cranial nerves II through XII are intact. No focal Motor or sensory deficits b/l.  Globally weak and + Tremor. PSYCHIATRIC: The patient is alert and oriented x 3.  SKIN: No obvious rash, lesion, or ulcer.     DATA REVIEW:   CBC Recent Labs  Lab 03/25/17 0442  WBC 3.9  HGB 7.6*  HCT 23.3*  PLT 197    Chemistries  Recent Labs  Lab 03/21/17 1033  03/22/17 1951  03/27/17 0449  NA 132*   < > 136   < > 137  K 5.3*   < > 3.5   < > 3.6  CL 93*   < > 105   < > 107  CO2 <7*   < > 19*   < > 21*  GLUCOSE 620*   < > 137*   < > 57*  BUN 27*   < > 27*   < > 27*  CREATININE 2.58*   < > 3.30*   < > 3.09*  CALCIUM 9.1   < > 7.5*   < > 7.5*  MG  --    < > 1.7  --   --   AST 13*  --   --   --   --   ALT 8*  --   --   --   --   ALKPHOS 55  --   --   --   --    BILITOT 2.2*  --   --   --   --    < > = values in this interval not displayed.    Cardiac Enzymes Recent Labs  Lab 03/21/17 1033  TROPONINI <0.03    Microbiology Results  Results for orders placed or performed during the hospital encounter of 03/21/17  Blood Culture (routine x 2)     Status: None   Collection Time: 03/21/17 10:33 AM  Result Value Ref Range Status   Specimen Description BLOOD Blood Culture adequate volume  Final   Special Requests   Final    BOTTLES DRAWN AEROBIC AND ANAEROBIC RIGHT ANTECUBITAL   Culture NO GROWTH 5 DAYS  Final   Report Status 03/26/2017 FINAL  Final  Blood Culture (routine x 2)     Status: None   Collection Time: 03/21/17 11:11 AM  Result Value Ref Range Status   Specimen Description BLOOD Blood Culture adequate volume  Final   Special Requests   Final    BOTTLES DRAWN AEROBIC AND ANAEROBIC BLOOD RIGHT HAND   Culture NO GROWTH 5 DAYS  Final   Report Status 03/26/2017 FINAL  Final  MRSA PCR Screening     Status: None   Collection Time: 03/21/17  2:45 PM  Result Value Ref Range Status   MRSA by PCR NEGATIVE NEGATIVE Final    Comment:        The GeneXpert MRSA Assay (FDA approved for NASAL specimens only), is one component of a comprehensive MRSA colonization surveillance program. It is not intended to diagnose MRSA infection nor to guide or monitor treatment for MRSA infections.   Urine Culture     Status: None   Collection Time: 03/21/17  3:51 PM  Result Value Ref Range Status   Specimen Description URINE, RANDOM  Final   Special Requests NONE  Final   Culture   Final    NO GROWTH Performed at [facility], [place] [address] [place], [place] [address]    Report Status 03/22/2017 FINAL  Final    RADIOLOGY:  No results found.    Management plans discussed with the patient, family and they are in agreement.  CODE STATUS:     Code Status Orders  (From admission, onward)        Start     Ordered   03/21/17 1416   Full code  Continuous     03/21/17 1415    Code Status History    Date Active Date Inactive Code Status Order ID Comments User Context   This patient has a current code status but no historical code status.      TOTAL TIME TAKING CARE OF THIS PATIENT: 40 minutes.    Henreitta Leber M.D on 03/27/2017 at 12:07 PM  Between 7am to 6pm - Pager - [phone]  After 6pm go to [email or web address] - Proofreader  [facility] [place] [facility]  Office  [phone]  CC: Primary care physician; Patient, No Pcp Per

## 2017-03-31 LAB — BLOOD GAS, VENOUS
FIO2: 0.21
PO2 VEN: 49 mmHg — AB (ref 32.0–45.0)
Patient temperature: 37
pCO2, Ven: 19 mmHg — CL (ref 44.0–60.0)
pH, Ven: 7.09 — CL (ref 7.250–7.430)

## 2017-04-23 ENCOUNTER — Ambulatory Visit (INDEPENDENT_AMBULATORY_CARE_PROVIDER_SITE_OTHER): Payer: BLUE CROSS/BLUE SHIELD | Admitting: Nurse Practitioner

## 2017-04-23 ENCOUNTER — Other Ambulatory Visit: Payer: Self-pay

## 2017-04-23 VITALS — BP 98/64 | HR 79 | Temp 97.6°F | Ht 70.5 in | Wt 163.0 lb

## 2017-04-23 DIAGNOSIS — E1065 Type 1 diabetes mellitus with hyperglycemia: Secondary | ICD-10-CM | POA: Diagnosis not present

## 2017-04-23 DIAGNOSIS — I4892 Unspecified atrial flutter: Secondary | ICD-10-CM | POA: Diagnosis not present

## 2017-04-23 MED ORDER — LANCET DEVICE MISC: 1.0000 | Freq: Four times a day (QID) | 0 refills | Status: DC

## 2017-04-23 NOTE — Progress Notes (Signed)
Subjective:    Patient ID: Laurie Lin, female    DOB: 01/31/1956, 62 y.o.   MRN: [id number]  Laurie Lin is a 62 y.o. female presenting on 04/23/2017 for Establish Care (T1DM - Recent hospitalization DKA) and Dizziness (dizziness with sudden movement, altered mental status.  Has HH with [facility])   HPI Establish Care New Provider Pt has not had recent PCP.  Was recently discharged from inpatient rehab center [facility] from hospitalization at [facility] on 03/21/2017 for T1DM with DKA.  [facility] set up for PCP here before discharge.   - Endocrinology at [facility] - Dr. Frederik Pear and Mickle Plumb, PA Last seen in ofice on 11/08/2015.  Pt missed her last appointment on 01/12/17.  Obtain records from [facility].  Pt has been infrequently seen by Endocrine and is frequently lost to followup.  Pt reports is difficult to go to [facility] and would prefer to have transfer of care to [facility].  Pt also declines [facility] Endocrine.  Dr. Candiss Norse has sent referral to [facility] endocrinology.  Pt reports her next appointment will be Feb 4th: [facility] Endocrinology. - Dr. Candiss Norse - Nephrology at [place]  T1DM Pt has most recently had hospitalization on 03/21/18.Pt states DKA was caused because she wasn't feeling well, was't eating and had stopped taking insulin completely. - Control of CBG since discharge from hospital has been uncontrolled.  At [facility], pt notes it was always high >600 and she was provided meal trays that were not for T1DM diet and didn't have control over coverage of her carb servings. - Since home, has had highs.  Review of glucose meter shows most CBG 250-350 with high CBG > 400 and also lowest around 115. - Pt reports symptoms of hypoglycemia occasionally when she feels too weak and dizzy to walk.  No measured hypoglycemia.  No symptoms since hospital discharge. Medications: pt is taking Lantus 14 units at night and Novolog 1 unit for every 15 g carbs.   - Pt reports that her last SSI was "1 unit  for every 20 mg/dL over target range," but pt does not know what her target range is. - In detailed review of Endocrinology records, 2014 was last documented sliding scale.  Last insulin dosing available shows Novolog with 1:12 carb ratio and 1 unit per 40 mg/dL when CBG > 140 mg/dL. 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  Now, cites cost as reason she has not resumed insulin pump.  - Pt is also noting altered mental status (mild) with some confusion since her hospitalization, but she did have severe DKA, dehydration leading to AKI.  Pt also notes no signs of stroke were noted during that admission and this has not worsened since being home.     Atrial flutter: Pt was prescribed Xarelto, but has not started taking it yet.  She did get her initial Rx filled, but was afraid of side effects after reading about it.  She has not had any recent followup with cardiology.  First and only visit was with [facility] in 10/2015.  Again, she would like a referral for more local Cardiology office.   - Reports no additional heart palpitations recently. - She does report occasional dizziness with position changes, sudden movements in last 2 weeks.  Has not had falls in last 2 weeks despite symptoms. - Pt is only taking metoprolol 12.5 mg bid and no other rate/rhythm/BP meds  Past Medical History:  Diagnosis Date  . Diabetes mellitus without complication ([place])   . GERD (gastroesophageal reflux disease)   . Hypertension   . Renal disorder    stage 3    Past Surgical History:  Procedure Laterality Date  . EYE SURGERY     bilateral caterac   Social History   Socioeconomic History  . Marital status: Married    Spouse name: Not on file  . Number of children: Not on file  . Years of education: Not on file  . Highest education level: Not on file  Social Needs  . Financial resource strain: Not on file  . Food insecurity - worry:  Not on file  . Food insecurity - inability: Not on file  . Transportation needs - medical: Not on file  . Transportation needs - non-medical: Not on file  Occupational History  . Not on file  Tobacco Use  . Smoking status: Never Smoker  . Smokeless tobacco: Never Used  Substance and Sexual Activity  . Alcohol use: Yes    Comment: heavy drinker  . Drug use: No  . Sexual activity: No  Other Topics Concern  . Not on file  Social History Narrative  . Not on file   Family History  Problem Relation Age of Onset  . Diabetes Mother    Current Outpatient Medications on File Prior to Visit  Medication Sig  . escitalopram (LEXAPRO) 5 MG tablet Take 5 mg by mouth daily.  Marland Kitchen esomeprazole (NEXIUM) 40 MG capsule Take 40 mg by mouth 2 (two) times daily.  . folic acid (FOLVITE) 1 MG tablet Take 1 tablet (1 mg total) by mouth daily.  . insulin aspart (NOVOLOG) 100 UNIT/ML injection Inject 0-9 Units into the skin 3 (three) times daily with meals. BS 70-120 - 0 units       121-150 - 1 units       151-200 - 2 units        201-250 - 3 units        251-300 - 4 units        301-350 - 5 units        351-400 - 6 units        > 400 call md.  . insulin glargine (LANTUS) 100 UNIT/ML injection Inject 0.07 mLs (7 Units total) into the skin at bedtime. (Patient taking differently: Inject 14 Units into the skin at bedtime. )  . metoprolol tartrate (LOPRESSOR) 25 MG tablet Take 12.5 mg by mouth 2 (two) times daily.   . mirtazapine (REMERON) 15 MG tablet Take 7.5 mg by mouth at bedtime.  . Multiple Vitamin (MULTIVITAMIN WITH MINERALS) TABS tablet Take 1 tablet by mouth daily. (Patient not taking: Reported on 04/23/2017)   No current facility-administered medications on file prior to visit.     Review of Systems  HENT: Negative.   Eyes: Negative.   Respiratory: Negative.   Cardiovascular: Negative.   Gastrointestinal: Negative.   Endocrine: Positive for polydipsia and polyphagia.  Genitourinary:  Negative.   Musculoskeletal: Negative.   Skin: Negative.   Allergic/Immunologic: Negative.   Neurological: Positive for dizziness and weakness.  Hematological: Negative.   Psychiatric/Behavioral: Negative.    Per HPI unless specifically indicated above     Objective:    BP 98/64 (BP Location: Right Arm, Patient Position: Sitting, Cuff Size: Small)   Pulse 79   Temp 97.6 F (36.4 C) (Oral)   Ht 5' 10.5" (1.791 m)   Wt 163  lb (73.9 kg) Comment: pt reported  LMP  (LMP Unknown)   BMI 23.06 kg/m   Wt Readings from Last 3 Encounters:  04/23/17 163 lb (73.9 kg)  03/23/17 188 lb 1.6 oz (85.3 kg)    Physical Exam  General - healthy, well-appearing, NAD HEENT - Normocephalic, atraumatic Neck - supple, non-tender, no LAD, no thyromegaly, no carotid bruit Heart - Irregular RR, normal S1/S2,  no murmurs heard Lungs - Clear throughout all lobes, no wheezing, crackles, or rhonchi. Normal work of breathing. Extremeties - non-tender, +1 pitting pedal edema, cap refill < 2 seconds, peripheral pulses intact +2 bilaterally Skin - warm, dry, erythematous over lower leg c/w PAD Neuro - awake, alert, oriented x3, normal gait Psych - Normal mood and affect, normal behavior   Results for orders placed or performed during the hospital encounter of 03/21/17  Blood Culture (routine x 2)  Result Value Ref Range   Specimen Description BLOOD Blood Culture adequate volume    Special Requests      BOTTLES DRAWN AEROBIC AND ANAEROBIC RIGHT ANTECUBITAL   Culture NO GROWTH 5 DAYS    Report Status 03/26/2017 FINAL   Blood Culture (routine x 2)  Result Value Ref Range   Specimen Description BLOOD Blood Culture adequate volume    Special Requests      BOTTLES DRAWN AEROBIC AND ANAEROBIC BLOOD RIGHT HAND   Culture NO GROWTH 5 DAYS    Report Status 03/26/2017 FINAL   Urine Culture  Result Value Ref Range   Specimen Description URINE, RANDOM    Special Requests NONE    Culture      NO  GROWTH Performed at [facility], [address] [place], [place] [address]    Report Status 03/22/2017 FINAL   MRSA PCR Screening  Result Value Ref Range   MRSA by PCR NEGATIVE NEGATIVE  Basic metabolic panel  Result Value Ref Range   Sodium 137 135 - 145 mmol/L   Potassium 3.6 3.5 - 5.1 mmol/L   Chloride 107 101 - 111 mmol/L   CO2 21 (L) 22 - 32 mmol/L   Glucose, Bld 57 (L) 65 - 99 mg/dL   BUN 27 (H) 6 - 20 mg/dL   Creatinine, Ser 3.09 (H) 0.44 - 1.00 mg/dL   Calcium 7.5 (L) 8.9 - 10.3 mg/dL   GFR calc non Af Amer 15 (L) >60 mL/min   GFR calc Af Amer 18 (L) >60 mL/min   Anion gap 9 5 - 15      Assessment & Plan:   Problem List Items Addressed This Visit      Cardiovascular and Mediastinum   Atrial flutter ([place]) - Primary    Pt with history of atrial flutter with palpitations in 2017.  Pt has been taking metoprolol 12.5 mg bid with new dizziness and symptoms of postural hypotension.  Pt was asked to start taking Xarelto, but she did not start and is not currently on anticoagulation. - Currently rate controlled today, but with irregular HR/rhythm on auscultation today.  Plan: 1. Continue metoprolol for now. 2. Referral placed for [facility] cardiology. Discussed need for anticoagulation.  Pt declines to start Xarelto today despite explanation of risks vs benefits of afib clot formation and anticoagulation therapy vs clot formation without anticoagulation and risk of CVA, MI. 3. Followup as needed if recurrent symptoms of hypotension.        Relevant Orders   Ambulatory referral to Cardiology     Endocrine   Type  1 diabetes mellitus with hyperglycemia (HCC)    Currently uncontrolled.  Pt without consistent dosing of insulin and no recent or prior regular followup with endocrinology. - Complications: hyperglycemia, cataracts, skin changes c/w PAD BLE, kidney disease and recent AKI after DKA  Plan: 1. Continue Lantus 14 units once daily- enforced w/ pt that she should  use this even on sick days and when not eating. 2. Continue Novolog 1 unit for every 15 g carbs eaten. SSI: 1 unit per 30 above target of 120.  (Interval: pt called and was not performing SSI, simplified to 1 unit per 50 > 150 on 04/29/16) - Educated pt about importance of consistent carb coverage.   3. Reviewed self care and CBG checks with pt.  Requesting am fasting, pre lunch, pre dinner, and bedtime.  Will do 2 hr post prandial if needed at next appointment. 4. Until [facility] appointment, keep very close followup for obtaining improved glucose control. Return in about 2 weeks (around 05/07/2017) for diabetes.       Relevant Medications   Lancets (FREESTYLE) lancets      Meds ordered this encounter  Medications  . DISCONTD: Lancet Device MISC    Sig: 1 Device by Does not apply route 4 (four) times daily.    Dispense:  120 each    Refill:  0    Provide single use disposable automatic lancet device.    Order Specific Question:   Supervising Provider    Answer:   Olin Hauser [[id number]]  . Lancet Devices (LANCET DEVICE WITH EJECTOR) MISC    Sig: 1 Device by Does not apply route once for 1 dose. Freestyle lancet with ejector    Dispense:  1 each    Refill:  0    Order Specific Question:   Supervising Provider    Answer:   Olin Hauser [[id number]]  . Lancets (FREESTYLE) lancets    Sig: 1 each by Other route 4 (four) times daily. Use as instructed    Dispense:  100 each    Refill:  12    Order Specific Question:   Supervising Provider    Answer:   Olin Hauser [[id number]]      Follow up plan: Return in about 2 weeks (around 05/07/2017) for diabetes.  Cassell Smiles, DNP, AGPCNP-BC Adult Gerontology Primary Care Nurse Practitioner [facility] 04/30/2017, 2:39 PM

## 2017-04-23 NOTE — Patient Instructions (Addendum)
Laurie Lin, Thank you for coming in to clinic today.  1. For your Diabetes: - Continue Lantus 14 units in evening. - Continue Novolog carb coverage with 1 unit for every 15 g carbs eaten. - Continue Sliding scale novolog coverage 1 unit for every 30 points above target. CBG 120-150 - 1 unit novolog CBG 150-180 - 2 unit novolog CBG 180-210 - 3 units novolog CBG 210-240 - 4 units novolog CBG 240-270 - 5 units novolog CBG 270-300 - 6 units novolog CBG 300-330 - 7 units novolog CBG 330-400 - 8 units novolog If CBG > 400, Call NP at [facility].  If you have low blood sugar, use 15 g carb snack.  Smarties candies are a good, quick option when you are unable to get up to get orange juice.  Take 2.5 rolls smarties for your single 15g carb snack.  Also call [facility] if you have a CBG below 70.  We will check on your [facility] endocrine referral.   2. Stop metoprolol.  Your dizziness is likely from low blood pressure. - If you notice the pounding, take 1/2 tablet metoprolol and call [facility].   Please schedule a follow-up appointment with Cassell Smiles, AGNP. Return in about 2 weeks (around 05/07/2017) for diabetes.   If you have any other questions or concerns, please feel free to call the clinic or send a message through [facility]. You may also schedule an earlier appointment if necessary.  You will receive a survey after today's visit either digitally by e-mail or paper by [employer]. Your experiences and feedback matter to [place].  Please respond so we know how we are doing as we provide care for you.   Cassell Smiles, DNP, AGNP-BC Adult Gerontology Nurse Practitioner [place]

## 2017-04-24 ENCOUNTER — Telehealth: Payer: Self-pay

## 2017-04-24 NOTE — Telephone Encounter (Signed)
Liala called from [place] concern orders to continue care. I attempted to return her call, no answer. LMOM [phone]

## 2017-04-28 ENCOUNTER — Telehealth: Payer: Self-pay | Admitting: Nurse Practitioner

## 2017-04-28 MED ORDER — FREESTYLE LANCETS MISC: 1.0000 | Freq: Four times a day (QID) | 12 refills | Status: DC

## 2017-04-28 MED ORDER — LANCET DEVICE WITH EJECTOR MISC: 1.0000 | Freq: Once | 0 refills | Status: AC

## 2017-04-28 NOTE — Telephone Encounter (Signed)
Call placed to patient for brief update of CBGs.  Interval history 04/28/17: has had a couple hypoglycemic symptom events, but no CBG < 70  04/24/17: 167 fasting 04/25/17: 78 -shaky (1/2 c orange juice) 04/26/17: 112 fasting 04/27/17: 83 was beginning to feel hypoglycemic 04/28/17: 107 today  Pt has been only dosing carb coverage.  Has not used her sliding scale insulin.  States she had one premeal CBG > 300 and dosed "a little extra novolog" in addition to carb coverage.    Encouraged pt to keep things more simple and safe moving forward.  If unable to check CBG, only cover carbs for that meal and skip SSI.  - Continue carb coverage 1 unit per 15 g carbs. - When checking CBG, change SSI to 1 unit per 50.   120-200: 1 unit additional novolog CBG 200-250: 2 additional units novolog CBG 250-300: 3 additional units novolog  Pt has scheduled appointment at [facility] on 05/01/17.  Pt will need new lancet device.  Order placed.   - Was encouraged by pharmacy to ask about getting CGM - will defer to Endocrinology. Pt will also need new meter at that time. Continue regular schedule followup.

## 2017-04-30 ENCOUNTER — Encounter: Payer: Self-pay | Admitting: Nurse Practitioner

## 2017-04-30 DIAGNOSIS — K449 Diaphragmatic hernia without obstruction or gangrene: Secondary | ICD-10-CM | POA: Insufficient documentation

## 2017-04-30 DIAGNOSIS — G629 Polyneuropathy, unspecified: Secondary | ICD-10-CM | POA: Insufficient documentation

## 2017-04-30 DIAGNOSIS — E1065 Type 1 diabetes mellitus with hyperglycemia: Secondary | ICD-10-CM | POA: Insufficient documentation

## 2017-04-30 DIAGNOSIS — I4892 Unspecified atrial flutter: Secondary | ICD-10-CM | POA: Insufficient documentation

## 2017-04-30 DIAGNOSIS — H269 Unspecified cataract: Secondary | ICD-10-CM | POA: Insufficient documentation

## 2017-04-30 DIAGNOSIS — R809 Proteinuria, unspecified: Secondary | ICD-10-CM | POA: Insufficient documentation

## 2017-04-30 NOTE — Assessment & Plan Note (Signed)
Currently uncontrolled.  Pt without consistent dosing of insulin and no recent or prior regular followup with endocrinology. - Complications: hyperglycemia, cataracts, skin changes c/w PAD BLE, kidney disease and recent AKI after DKA  Plan: 1. Continue Lantus 14 units once daily- enforced w/ pt that she should use this even on sick days and when not eating. 2. Continue Novolog 1 unit for every 15 g carbs eaten. SSI: 1 unit per 30 above target of 120.  (Interval: pt called and was not performing SSI, simplified to 1 unit per 50 > 150 on 04/29/16) - Educated pt about importance of consistent carb coverage.   3. Reviewed self care and CBG checks with pt.  Requesting am fasting, pre lunch, pre dinner, and bedtime.  Will do 2 hr post prandial if needed at next appointment. 4. Until [facility] appointment, keep very close followup for obtaining improved glucose control. Return in about 2 weeks (around 05/07/2017) for diabetes.

## 2017-04-30 NOTE — Assessment & Plan Note (Addendum)
Pt with history of atrial flutter with palpitations in 2017.  Pt has been taking metoprolol 12.5 mg bid with new dizziness and symptoms of postural hypotension.  Pt was asked to start taking Xarelto, but she did not start and is not currently on anticoagulation. - Currently rate controlled today, but with irregular HR/rhythm on auscultation today.  Plan: 1. Continue metoprolol for now. 2. Referral placed for [facility] cardiology. Discussed need for anticoagulation.  Pt declines to start Xarelto today despite explanation of risks vs benefits of afib clot formation and anticoagulation therapy vs clot formation without anticoagulation and risk of CVA, MI. 3. Followup as needed if recurrent symptoms of hypotension.

## 2017-05-05 NOTE — Telephone Encounter (Signed)
Attempted to contact the pt to f/u with her about her Endocrinology appt. I want to make sure the patient is aware of her appt w/ Nelma Rothman, MD  Specialties: Endocrinology, Diabetes and Metabolism  [facility] at [address] [place] [place]  [place]  [place], [place] [address]  Phone: [phone]

## 2017-05-05 NOTE — Telephone Encounter (Signed)
-----   Message from Mikey College, NP sent at 04/23/2017 12:35 PM EST ----- Regarding: Endocrine Referral Followup Original referral to [facility] Endocrine was sent by [facility] Dr. Candiss Norse.  Please followup with [facility] Endocrine about referral status and next appointment. - T1DM with very high blood sugars and DKA in December

## 2017-05-07 ENCOUNTER — Other Ambulatory Visit: Payer: Self-pay

## 2017-05-07 ENCOUNTER — Ambulatory Visit (INDEPENDENT_AMBULATORY_CARE_PROVIDER_SITE_OTHER): Payer: BLUE CROSS/BLUE SHIELD | Admitting: Nurse Practitioner

## 2017-05-07 ENCOUNTER — Encounter: Payer: Self-pay | Admitting: Nurse Practitioner

## 2017-05-07 ENCOUNTER — Telehealth: Payer: Self-pay | Admitting: Nurse Practitioner

## 2017-05-07 VITALS — BP 113/58 | HR 102 | Temp 97.6°F | Ht 70.5 in | Wt 170.2 lb

## 2017-05-07 DIAGNOSIS — E1065 Type 1 diabetes mellitus with hyperglycemia: Secondary | ICD-10-CM | POA: Diagnosis not present

## 2017-05-07 NOTE — Telephone Encounter (Signed)
[name], PT assistant returned call.  She was not with the patient when the patient fell.  Pt reported fall to [place] during a session. Pt reported to [place] [facility] injury w/ recommendation for surgery that was previously declined.   - No known injury with the fall and no call was placed to EMS. Pt was able to stand with assistance of her husband after the fall.

## 2017-05-07 NOTE — Telephone Encounter (Signed)
Samantha with [facility] PT wanted to report that pt fell on Monday this week.  Pt said her left knee gave out.  Her number is [phone]

## 2017-05-07 NOTE — Patient Instructions (Addendum)
Xoe, Thank you for coming in to clinic today.  1. Correction insulin scale: ("Sliding Scale Insulin") For blood sugar 121-150: give 1 unit novolog For blood sugar 151-200: give 2 units novolog For blood sugar 201-250: give 3 units novolog For blood sugar 251-300: give 4 unit novolog For blood sugar 301-350: give 5 units novolog For blood sugar 351-400: give 6 units novolog For blood sugar > 400: give 6 units novolog and call MD  2. Continue your carbohydrate coverage of 1 unit per 15 grams of carbohydrate. - THIS is given for any meal or snack and is in addition to your correction scale at meals.  3. Keep a log of your glucose readings before meals and at bedtime with all carbs eaten and insulin given. (Try your new chart)  4. CONTINUE Lantus 14 units at night regardless of what your blood sugar readings are, food intake, sick days  - ANY Day, take your Lantus.  Please schedule a follow-up appointment with Cassell Smiles, AGNP. Return in about 6 months (around 11/04/2017) for Diabetes.  If you have any other questions or concerns, please feel free to call the clinic or send a message through [place]. You may also schedule an earlier appointment if necessary.  You will receive a survey after today's visit either digitally by e-mail or paper by [employer]. Your experiences and feedback matter to [place].  Please respond so we know how we are doing as we provide care for you.   Cassell Smiles, DNP, AGNP-BC Adult Gerontology Nurse Practitioner [place]

## 2017-05-07 NOTE — Telephone Encounter (Signed)
Phone call

## 2017-05-07 NOTE — Telephone Encounter (Signed)
Laurie Lin, PT with [facility]. Left message that if there were any ongoing concerns, to return call back.  Fall without injury will be ok to continue treatment. If [name] has recommendations after working with her that she may need additional intervention/assessment/medical evaluation I will welcome her observations.

## 2017-05-07 NOTE — Progress Notes (Signed)
Subjective:    Patient ID: Laurie Lin, female    DOB: Mar 08, 1956, 62 y.o.   MRN: [id number]  Laurie Lin is a 62 y.o. female presenting on 05/07/2017 for Diabetes   HPI Diabetes Pt presents today for follow up of Type 2 diabetes mellitus. She is checking CBG very inconsistently at home.  Range of readings 70-300 and are poorly documented for premeal vs post meal checks.   - Current diabetic medications include: lantus 14 units at bedtime and novolog 1 units per 15g carbs, but pt is still not certain about SSI. - She is currently symptomatic with paresthesias.  - She denies polydipsia, polyphagia, polyuria, headaches, diaphoresis, shakiness, chills and changes in vision.   - Clinical course has been improving slightly with better glucose control than 2 weeks ago with only 1 low. - She  reports no regular exercise routine. - Her diet is moderate in salt, moderate in fat, and moderate in carbohydrates. - Weight trend: stable  PREVENTION: Eye exam current (within one year): no Foot exam current (within one year): no Lipid/ASCVD risk reduction - on statin: no Kidney protection - on ace or arb: no Recent Labs    03/23/17 1621  HGBA1C 12.3*    Social History   Tobacco Use  . Smoking status: Never Smoker  . Smokeless tobacco: Never Used  Substance Use Topics  . Alcohol use: Yes    Comment: heavy drinker  . Drug use: No    Review of Systems Per HPI unless specifically indicated above     Objective:    BP (!) 113/58 (BP Location: Right Arm, Patient Position: Sitting, Cuff Size: Normal)   Pulse (!) 102   Temp 97.6 F (36.4 C) (Oral)   Ht 5' 10.5" (1.791 m)   Wt 170 lb 3.2 oz (77.2 kg)   SpO2 100%   BMI 24.08 kg/m   Wt Readings from Last 3 Encounters:  05/07/17 170 lb 3.2 oz (77.2 kg)  04/23/17 163 lb (73.9 kg)  03/23/17 188 lb 1.6 oz (85.3 kg)    Physical Exam  General - healthy, well-appearing, NAD HEENT - Normocephalic, atraumatic Neck - supple,  non-tender, no LAD, no thyromegaly, no carotid bruit Heart - RRR, no murmurs heard Lungs - Clear throughout all lobes, no wheezing, crackles, or rhonchi. Normal work of breathing. Extremeties - non-tender, no edema, cap refill < 2 seconds, peripheral pulses intact +2 bilaterally Skin - warm, dry Neuro - awake, alert, oriented x3, normal gait Psych - Normal mood and affect, normal behavior      Assessment & Plan:   Problem List Items Addressed This Visit      Endocrine   Type 1 diabetes mellitus with hyperglycemia ([place]) - Primary    Remains uncontrolled.  Improved insulin dosing, but remains unsure about correction/SSI.  Pt now has [facility] Endocrine visit scheduled. - Complications: hyperglycemia, cataracts, skin changes c/w PAD BLE, kidney disease and recent AKI after DKA  Plan: 1. Continue Lantus 14 units once daily- enforced w/ pt that she should use this even on sick days and when not eating. 2. Continue Novolog 1 unit for every 15 g carbs eaten.  Correction insulin scale: ("Sliding Scale Insulin") For blood sugar 121-150: give 1 unit novolog For blood sugar 151-200: give 2 units novolog For blood sugar 201-250: give 3 units novolog For blood sugar 251-300: give 4 unit novolog For blood sugar 301-350: give 5 units novolog For blood sugar 351-400: give 6 units novolog For blood  sugar > 400: give 6 units novolog and call MD  - Educated pt about importance of consistent carb coverage and how to use correction scale  3. Reviewed self care and CBG checks with pt.  Requesting am fasting, pre lunch, pre dinner, and bedtime.  Will do 2 hr post prandial if needed at next appointment. 4. Until [facility] appointment, keep very close followup for obtaining improved glucose control.  Return in about 6 months (around 11/04/2017) for Diabetes.       Relevant Medications   ONETOUCH DELICA LANCETS 69S MISC      Meds ordered this encounter  Medications  . ONETOUCH DELICA LANCETS 85I MISC    Sig:  1 Device by Does not apply route 4 (four) times daily.    Dispense:  100 each    Refill:  11    Order Specific Question:   Supervising Provider    Answer:   Olin Hauser [[id number]]      Follow up plan: Return in about 6 months (around 11/04/2017) for Diabetes.  A total of 35 minutes was spent face-to-face with this patient. Greater than 50% of this time was spent in counseling and coordination of care with the patient. Majority of time was spent discussing CBG checks and correction scale coverage.  Cassell Smiles, DNP, AGPCNP-BC Adult Gerontology Primary Care Nurse Practitioner [facility] 05/20/2017, 1:52 PM

## 2017-05-19 ENCOUNTER — Ambulatory Visit: Payer: BLUE CROSS/BLUE SHIELD | Admitting: Nurse Practitioner

## 2017-05-20 ENCOUNTER — Encounter: Payer: Self-pay | Admitting: Nurse Practitioner

## 2017-05-20 MED ORDER — ONETOUCH DELICA LANCETS 33G MISC: 1.0000 | Freq: Four times a day (QID) | 11 refills | Status: DC

## 2017-05-20 NOTE — Assessment & Plan Note (Signed)
Remains uncontrolled.  Improved insulin dosing, but remains unsure about correction/SSI.  Pt now has [facility] Endocrine visit scheduled. - Complications: hyperglycemia, cataracts, skin changes c/w PAD BLE, kidney disease and recent AKI after DKA  Plan: 1. Continue Lantus 14 units once daily- enforced w/ pt that she should use this even on sick days and when not eating. 2. Continue Novolog 1 unit for every 15 g carbs eaten.  Correction insulin scale: ("Sliding Scale Insulin") For blood sugar 121-150: give 1 unit novolog For blood sugar 151-200: give 2 units novolog For blood sugar 201-250: give 3 units novolog For blood sugar 251-300: give 4 unit novolog For blood sugar 301-350: give 5 units novolog For blood sugar 351-400: give 6 units novolog For blood sugar > 400: give 6 units novolog and call MD  - Educated pt about importance of consistent carb coverage and how to use correction scale  3. Reviewed self care and CBG checks with pt.  Requesting am fasting, pre lunch, pre dinner, and bedtime.  Will do 2 hr post prandial if needed at next appointment. 4. Until [facility] appointment, keep very close followup for obtaining improved glucose control.  Return in about 6 months (around 11/04/2017) for Diabetes.

## 2017-05-26 ENCOUNTER — Telehealth: Payer: Self-pay | Admitting: Nurse Practitioner

## 2017-05-26 DIAGNOSIS — M25561 Pain in right knee: Principal | ICD-10-CM

## 2017-05-26 DIAGNOSIS — G8929 Other chronic pain: Secondary | ICD-10-CM

## 2017-05-26 MED ORDER — DICLOFENAC SODIUM 75 MG PO TBEC: 75.0000 mg | DELAYED_RELEASE_TABLET | Freq: Two times a day (BID) | ORAL | 0 refills | Status: AC

## 2017-05-26 MED ORDER — DICLOFENAC SODIUM 3 % TD GEL: 1.0000 "application " | Freq: Four times a day (QID) | TRANSDERMAL | 5 refills | Status: DC | PRN

## 2017-05-26 NOTE — Telephone Encounter (Signed)
Pt is in a lot of pain and asked for a call back  [phone]

## 2017-05-26 NOTE — Telephone Encounter (Signed)
Start topical and oral diclofenac for pain relief.  Limit oral diclofenac to 15 days.

## 2017-05-26 NOTE — Telephone Encounter (Signed)
The pt was notified of that prescription was sent over to her pharmacy.

## 2017-05-26 NOTE — Telephone Encounter (Signed)
The pt called complaining of back, neck pain that from a injured several years . The pt states the pain has flared back up since PT. She also complains of right knee pain that started after her PT as well.

## 2017-05-28 ENCOUNTER — Other Ambulatory Visit: Payer: Self-pay

## 2017-05-28 ENCOUNTER — Ambulatory Visit (INDEPENDENT_AMBULATORY_CARE_PROVIDER_SITE_OTHER): Payer: BLUE CROSS/BLUE SHIELD | Admitting: Nurse Practitioner

## 2017-05-28 ENCOUNTER — Encounter: Payer: Self-pay | Admitting: Nurse Practitioner

## 2017-05-28 VITALS — BP 111/85 | HR 97 | Temp 98.0°F | Ht 69.0 in | Wt 169.4 lb

## 2017-05-28 DIAGNOSIS — M2352 Chronic instability of knee, left knee: Secondary | ICD-10-CM

## 2017-05-28 DIAGNOSIS — N182 Chronic kidney disease, stage 2 (mild): Secondary | ICD-10-CM

## 2017-05-28 DIAGNOSIS — M25562 Pain in left knee: Secondary | ICD-10-CM

## 2017-05-28 DIAGNOSIS — I10 Essential (primary) hypertension: Secondary | ICD-10-CM

## 2017-05-28 DIAGNOSIS — K760 Fatty (change of) liver, not elsewhere classified: Secondary | ICD-10-CM

## 2017-05-28 DIAGNOSIS — K7469 Other cirrhosis of liver: Secondary | ICD-10-CM

## 2017-05-28 MED ORDER — LISINOPRIL 2.5 MG PO TABS: 2.5000 mg | ORAL_TABLET | Freq: Every day | ORAL | 5 refills | Status: DC

## 2017-05-28 NOTE — Patient Instructions (Addendum)
Laurie Lin, Thank you for coming in to clinic today.  1. We can start either duloxetine or gabapentin for your pain control.  Gabapentin will likely help more with your neuropathy.  Duloxetine can help with pain and moods if you feel you have down moods frequently.  2. For your kidney function, start lisinopril 2.5 mg once daily.  3. Continue your diabetes management at [facility].  4. You will be due for FASTING BLOOD WORK.  This means you should eat no food or drink after midnight.  Drink only water or coffee without cream/sugar on the morning of your lab visit. - Please go ahead and schedule a "Lab Only" visit in the morning at the clinic for lab draw in the next 7 days. - Your results will be available about 2-3 days after blood draw.  If you have set up a MyChart account, you can can log in to MyChart online to view your results and a brief explanation. Also, we can discuss your results together at your next office visit if you would like.  5. Can take acetaminophen 1,000 mg three times per day.  Please schedule a follow-up appointment with Cassell Smiles, AGNP. Return in about 3 months (around 08/25/2017) for hypertension, chronic knee and back pain.  If you have any other questions or concerns, please feel free to call the clinic or send a message through [place]. You may also schedule an earlier appointment if necessary.  You will receive a survey after today's visit either digitally by e-mail or paper by [employer]. Your experiences and feedback matter to [place].  Please respond so we know how we are doing as we provide care for you.   Cassell Smiles, DNP, AGNP-BC Adult Gerontology Nurse Practitioner [facility], [facility]   Duloxetine delayed-release capsules What is this medicine? DULOXETINE (doo LOX e teen) is used to treat depression, anxiety, and different types of chronic pain. This medicine may be used for other purposes; ask your health care provider or pharmacist if you have  questions. COMMON BRAND NAME(S): Cymbalta, [place]ren[place] What should I tell my health care provider before I take this medicine? They need to know if you have any of these conditions: -bipolar disorder or a family history of bipolar disorder -glaucoma -kidney disease -liver disease -suicidal thoughts or a previous suicide attempt -taken medicines called MAOIs like Carbex, Eldepryl, Marplan, Nardil, and Parnate within 14 days -an unusual reaction to duloxetine, other medicines, foods, dyes, or preservatives -pregnant or trying to get pregnant -breast-feeding How should I use this medicine? Take this medicine by mouth with a glass of water. Follow the directions on the prescription label. Do not cut, crush or chew this medicine. You can take this medicine with or without food. Take your medicine at regular intervals. Do not take your medicine more often than directed. Do not stop taking this medicine suddenly except upon the advice of your doctor. Stopping this medicine too quickly may cause serious side effects or your condition may worsen. A special MedGuide will be given to you by the pharmacist with each prescription and refill. Be sure to read this information carefully each time. Talk to your pediatrician regarding the use of this medicine in children. While this drug may be prescribed for children as young as 63 years of age for selected conditions, precautions do apply. Overdosage: If you think you have taken too much of this medicine contact a poison control center or emergency room at once. NOTE: This medicine is only  for you. Do not share this medicine with others. What if I miss a dose? If you miss a dose, take it as soon as you can. If it is almost time for your next dose, take only that dose. Do not take double or extra doses. What may interact with this medicine? Do not take this medicine with any of the following medications: -desvenlafaxine -levomilnacipran -linezolid -MAOIs like  Carbex, Eldepryl, Marplan, Nardil, and Parnate -methylene blue (injected into a vein) -milnacipran -thioridazine -venlafaxine This medicine may also interact with the following medications: -alcohol -amphetamines -aspirin and aspirin-like medicines -certain antibiotics like ciprofloxacin and enoxacin -certain medicines for blood pressure, heart disease, irregular heart beat -certain medicines for depression, anxiety, or psychotic disturbances -certain medicines for migraine headache like almotriptan, eletriptan, frovatriptan, naratriptan, rizatriptan, sumatriptan, zolmitriptan -certain medicines that treat or prevent blood clots like warfarin, enoxaparin, and dalteparin -cimetidine -fentanyl -lithium -NSAIDS, medicines for pain and inflammation, like ibuprofen or naproxen -phentermine -procarbazine -rasagiline -sibutramine -St. John's wort -theophylline -tramadol -tryptophan This list may not describe all possible interactions. Give your health care provider a list of all the medicines, herbs, non-prescription drugs, or dietary supplements you use. Also tell them if you smoke, drink alcohol, or use illegal drugs. Some items may interact with your medicine. What should I watch for while using this medicine? Tell your doctor if your symptoms do not get better or if they get worse. Visit your doctor or health care professional for regular checks on your progress. Because it may take several weeks to see the full effects of this medicine, it is important to continue your treatment as prescribed by your doctor. Patients and their families should watch out for new or worsening thoughts of suicide or depression. Also watch out for sudden changes in feelings such as feeling anxious, agitated, panicky, irritable, hostile, aggressive, impulsive, severely restless, overly excited and hyperactive, or not being able to sleep. If this happens, especially at the beginning of treatment or after a change  in dose, call your health care professional. Laurie Lin may get drowsy or dizzy. Do not drive, use machinery, or do anything that needs mental alertness until you know how this medicine affects you. Do not stand or sit up quickly, especially if you are an older patient. This reduces the risk of dizzy or fainting spells. Alcohol may interfere with the effect of this medicine. Avoid alcoholic drinks. This medicine can cause an increase in blood pressure. This medicine can also cause a sudden drop in your blood pressure, which may make you feel faint and increase the chance of a fall. These effects are most common when you first start the medicine or when the dose is increased, or during use of other medicines that can cause a sudden drop in blood pressure. Check with your doctor for instructions on monitoring your blood pressure while taking this medicine. Your mouth may get dry. Chewing sugarless gum or sucking hard candy, and drinking plenty of water may help. Contact your doctor if the problem does not go away or is severe. What side effects may I notice from receiving this medicine? Side effects that you should report to your doctor or health care professional as soon as possible: -allergic reactions like skin rash, itching or hives, swelling of the face, lips, or tongue -anxious -breathing problems -confusion -changes in vision -chest pain -confusion -elevated mood, decreased need for sleep, racing thoughts, impulsive behavior -eye pain -fast, irregular heartbeat -feeling faint or lightheaded, falls -feeling agitated, angry, or irritable -  hallucination, loss of contact with reality -high blood pressure -loss of balance or coordination -palpitations -redness, blistering, peeling or loosening of the skin, including inside the mouth -restlessness, pacing, inability to keep still -seizures -stiff muscles -suicidal thoughts or other mood changes -trouble passing urine or change in the amount of  urine -trouble sleeping -unusual bleeding or bruising -unusually weak or tired -vomiting -yellowing of the eyes or skin Side effects that usually do not require medical attention (report to your doctor or health care professional if they continue or are bothersome): -change in sex drive or performance -change in appetite or weight -constipation -dizziness -dry mouth -headache -increased sweating -nausea -tired This list may not describe all possible side effects. Call your doctor for medical advice about side effects. You may report side effects to FDA at 1-800-FDA-1088. Where should I keep my medicine? Keep out of the reach of children. Store at room temperature between 20 and 25 degrees C (68 to 77 degrees F). Throw away any unused medicine after the expiration date. NOTE: This sheet is a summary. It may not cover all possible information. If you have questions about this medicine, talk to your doctor, pharmacist, or health care provider.  2018 Elsevier/Gold Standard (2015-09-06 18:16:03)    Gabapentin capsules or tablets What is this medicine? GABAPENTIN (GA ba pen tin) is used to control partial seizures in adults with epilepsy. It is also used to treat certain types of nerve pain. This medicine may be used for other purposes; ask your health care provider or pharmacist if you have questions. COMMON BRAND NAME(S): Active-PAC with Gabapentin, Gabarone, Neurontin What should I tell my health care provider before I take this medicine? They need to know if you have any of these conditions: -kidney disease -suicidal thoughts, plans, or attempt; a previous suicide attempt by you or a family member -an unusual or allergic reaction to gabapentin, other medicines, foods, dyes, or preservatives -pregnant or trying to get pregnant -breast-feeding How should I use this medicine? Take this medicine by mouth with a glass of water. Follow the directions on the prescription label. You can  take it with or without food. If it upsets your stomach, take it with food.Take your medicine at regular intervals. Do not take it more often than directed. Do not stop taking except on your doctor's advice. If you are directed to break the 600 or 800 mg tablets in half as part of your dose, the extra half tablet should be used for the next dose. If you have not used the extra half tablet within 28 days, it should be thrown away. A special MedGuide will be given to you by the pharmacist with each prescription and refill. Be sure to read this information carefully each time. Talk to your pediatrician regarding the use of this medicine in children. Special care may be needed. Overdosage: If you think you have taken too much of this medicine contact a poison control center or emergency room at once. NOTE: This medicine is only for you. Do not share this medicine with others. What if I miss a dose? If you miss a dose, take it as soon as you can. If it is almost time for your next dose, take only that dose. Do not take double or extra doses. What may interact with this medicine? Do not take this medicine with any of the following medications: -other gabapentin products This medicine may also interact with the following medications: -alcohol -antacids -antihistamines for allergy, cough and cold -  certain medicines for anxiety or sleep -certain medicines for depression or psychotic disturbances -homatropine; hydrocodone -naproxen -narcotic medicines (opiates) for pain -phenothiazines like chlorpromazine, mesoridazine, prochlorperazine, thioridazine This list may not describe all possible interactions. Give your health care provider a list of all the medicines, herbs, non-prescription drugs, or dietary supplements you use. Also tell them if you smoke, drink alcohol, or use illegal drugs. Some items may interact with your medicine. What should I watch for while using this medicine? Visit your doctor or  health care professional for regular checks on your progress. You may want to keep a record at home of how you feel your condition is responding to treatment. You may want to share this information with your doctor or health care professional at each visit. You should contact your doctor or health care professional if your seizures get worse or if you have any new types of seizures. Do not stop taking this medicine or any of your seizure medicines unless instructed by your doctor or health care professional. Stopping your medicine suddenly can increase your seizures or their severity. Wear a medical identification bracelet or chain if you are taking this medicine for seizures, and carry a card that lists all your medications. You may get drowsy, dizzy, or have blurred vision. Do not drive, use machinery, or do anything that needs mental alertness until you know how this medicine affects you. To reduce dizzy or fainting spells, do not sit or stand up quickly, especially if you are an older patient. Alcohol can increase drowsiness and dizziness. Avoid alcoholic drinks. Your mouth may get dry. Chewing sugarless gum or sucking hard candy, and drinking plenty of water will help. The use of this medicine may increase the chance of suicidal thoughts or actions. Pay special attention to how you are responding while on this medicine. Any worsening of mood, or thoughts of suicide or dying should be reported to your health care professional right away. Women who become pregnant while using this medicine may enroll in the [place] Pregnancy Registry by calling [phone]. This registry collects information about the safety of antiepileptic drug use during pregnancy. What side effects may I notice from receiving this medicine? Side effects that you should report to your doctor or health care professional as soon as possible: -allergic reactions like skin rash, itching or hives, swelling of  the face, lips, or tongue -worsening of mood, thoughts or actions of suicide or dying Side effects that usually do not require medical attention (report to your doctor or health care professional if they continue or are bothersome): -constipation -difficulty walking or controlling muscle movements -dizziness -nausea -slurred speech -tiredness -tremors -weight gain This list may not describe all possible side effects. Call your doctor for medical advice about side effects. You may report side effects to FDA at 1-800-FDA-1088. Where should I keep my medicine? Keep out of reach of children. This medicine may cause accidental overdose and death if it taken by other adults, children, or pets. Mix any unused medicine with a substance like cat litter or coffee grounds. Then throw the medicine away in a sealed container like a sealed bag or a coffee can with a lid. Do not use the medicine after the expiration date. Store at room temperature between 15 and 30 degrees C (59 and 86 degrees F). NOTE: This sheet is a summary. It may not cover all possible information. If you have questions about this medicine, talk to your doctor, pharmacist, or health care  provider.  2018 Elsevier/Gold Standard (2013-06-03 15:26:50)

## 2017-05-28 NOTE — Progress Notes (Addendum)
Subjective:    Patient ID: Laurie Lin, female    DOB: July 09, 1955, 62 y.o.   MRN: [id number]  Laurie Lin is a 62 y.o. female presenting on 05/28/2017 for Knee Pain (left knee pain, related to injury deveral year ago); Back Pain (chronic intermittent mid-lower back pain. related to injury several yrs ago. Pt reports being thrown off a horse.); and Neck Pain (chronic intermittent neck pain)   HPI Knee pain/Neck pain/Back Pain Pt reports initial injury was about 20 years ago after being kicked in the knee with a frontal approach during karate.  Has had prior arthroscopy with initial injury, but no additional procedures.  Had instability since initial injury, but strength prevented buckling/collapse.  - Is seeking care today because she was doing physical therapy for gait and strength when she and her physical therapist have reported pt's left knee buckled.  Physical therapist has also recommended orthopedic evaluation. - In last 6 months, pt falls when this occurs. - Has to be very careful with holding onto chairs/cane for support.  Has popping and cracking in knee and felt on lateral side of joint.  Now having worsening of these symptoms since doing PT.  First session was very tired, but was having more back pain after therapy.  However, pt reports therapy has helped her become stronger and has a desire to be more active overall.  Increased activity has also increased awareness of left knee pain, instability, decreased strength. - Balancing has been more tough. - Tea pitcher is heaviest object she can lift with limitations of pain/decreased strength. - Not highly active before physical therapy - has been deconditioned even before last illness/DKA.   CKD - Has not taken lisinopril recently, but was taken off by prior provider.  Pt reports that Dr Candiss Norse would prefer more lisinopril at prior visits.  HTN - Pt is not taking any BP meds currently 2/2 dizziness, syncope.  Now has stable BP  off medication.  Has not experienced any additional dizziness or syncope in last 4 weeks.   - She is not checking BP at home or outside of clinic.    - Pt denies headache, lightheadedness, changes in vision, chest tightness/pressure, palpitations, leg swelling or sudden loss of speech. - She  reports an exercise routine that includes Physical Therapy, 2 days per week with independent exercises an additional 3 days per week - Her diet is moderate in salt, moderate in fat, and moderate in carbohydrates.     Social History   Tobacco Use  . Smoking status: Never Smoker  . Smokeless tobacco: Never Used  Substance Use Topics  . Alcohol use: Yes    Comment: heavy drinker  . Drug use: No    Review of Systems Per HPI unless specifically indicated above     Objective:    BP 111/85 (BP Location: Right Arm, Patient Position: Sitting, Cuff Size: Small)   Pulse 97   Temp 98 F (36.7 C) (Oral)   Ht 5\' 9"  (1.753 m)   Wt 169 lb 6.4 oz (76.8 kg)   BMI 25.02 kg/m   Wt Readings from Last 3 Encounters:  05/28/17 169 lb 6.4 oz (76.8 kg)  05/07/17 170 lb 3.2 oz (77.2 kg)  04/23/17 163 lb (73.9 kg)    Physical Exam  General - healthy weight, Chronically ill-appearing, NAD HEENT - Normocephalic, atraumatic Neck - supple, non-tender, no LAD, no thyromegaly, no carotid bruit Heart - RRR, no murmurs heard Lungs - Clear throughout all  lobes, no wheezing, crackles, or rhonchi. Normal work of breathing.  Musculoskeletal -  Left Bilateral Knee Inspection: Normal appearance and symmetrical. No ecchymosis or effusion. Palpation: Mild +TTP Left knee only lateral joint line. Pos crepitus. ROM: Full active ROM bilaterally Special Testing: Lachman / Valgus/Varus tests negative with intact ligaments (ACL, MCL, LCL). McMurray negative without meniscus symptoms. Strength: 5/5 intact knee flex/ext, ankle dorsi/plantarflex Neurovascular: distally intact sensation light touch and pulses  Extremeties -  non-tender, trace pedal edema, cap refill < 2 seconds, peripheral pulses intact +2 bilaterally Skin - warm, dry Neuro - awake, alert, oriented x3, antalgic gait Psych - Depressed mood and affect, tearful and withdrawn.  Participates better in conversation when her husband is not present in the room.      Assessment & Plan:   Problem List Items Addressed This Visit      Cardiovascular and Mediastinum   Essential hypertension    Currently controlled hypertension not on medications without dizziness, lightheadedness or syncope.  BP at goal.  Pt is working on lifestyle modifications.   - Current complications: CKD  Plan: 1. RESUME lisinopril 2.5 mg once daily. 2. Obtain labs CMP  3. Encouraged heart healthy diet and increasing exercise to 30 minutes most days of the week. 4. Check BP 1-2 x per week at home, keep log, and bring to clinic at next appointment. 5. Follow up 3 months.       Relevant Medications   lisinopril (PRINIVIL,ZESTRIL) 2.5 MG tablet   Other Relevant Orders   Lipid panel   Comprehensive metabolic panel     Digestive   Hepatic steatosis   Relevant Orders   Lipid panel   Comprehensive metabolic panel   Other cirrhosis of liver (HCC)    Pt with hepatic steatosis and prior diagnosis of cirrhosis of liver.  Pt without any recent evaluation of lipid panel or liver function.    Plan: 1. Obtain CMP, lipid panel. 2. Continue to avoid hepatotoxic substances. 3. Followup as needed after labs.  Consider referral to GI if needed in future.        Genitourinary   CKD (chronic kidney disease), stage II    Pt with CKD stage II and no current pharmacotherapy.  See HTN above.  Continue followup with Dr. Candiss Norse.      Relevant Medications   lisinopril (PRINIVIL,ZESTRIL) 2.5 MG tablet   Other Relevant Orders   Ambulatory referral to Orthopedic Surgery   Lipid panel   Comprehensive metabolic panel   Gamma GT    Other Visit Diagnoses    Acute pain of left knee    -   Primary   Relevant Orders   Ambulatory referral to Orthopedic Surgery   Chronic knee instability, left        Patient with acute on chronic knee difficulties.  Acute knee pain as a result of increasing activity and participating in PT for lower extremity weakness and gait training.  Chronic knee instabilty with occasional joint laxity over last 20 years after traumatic injury from [place].  Plan: 1. Referral to Orthopedics. 2. Continue PT for gait training. 3. Continue NSAID therapy diclofenac 75 mg bid as needed,  - START diclofenac gel 4 times daily as needed for pain. 4. Followup 2-4 weeks as needed.   Meds ordered this encounter  Medications  . lisinopril (PRINIVIL,ZESTRIL) 2.5 MG tablet    Sig: Take 1 tablet (2.5 mg total) by mouth daily.    Dispense:  30 tablet    Refill:  5    Order Specific Question:   Supervising Provider    Answer:   Olin Hauser [[id number]]    Follow up plan: Return in about 3 months (around 08/25/2017) for hypertension, chronic knee and back pain.   A total of 40 minutes was spent face-to-face with this patient. Greater than 50% of this time was spent in counseling and coordination of care with the patient.     Cassell Smiles, DNP, AGPCNP-BC Adult Gerontology Primary Care Nurse Practitioner [facility] 06/04/2017, 1:46 PM

## 2017-06-01 ENCOUNTER — Telehealth: Payer: Self-pay | Admitting: Nurse Practitioner

## 2017-06-01 NOTE — Telephone Encounter (Signed)
Attempted to contact the pt, no answer. LMOM to return my call.  

## 2017-06-01 NOTE — Telephone Encounter (Signed)
Pt has decided against both pain medications that were suggested.  Her call back number is [phone]

## 2017-06-03 DIAGNOSIS — M5416 Radiculopathy, lumbar region: Secondary | ICD-10-CM | POA: Insufficient documentation

## 2017-06-03 DIAGNOSIS — G8929 Other chronic pain: Secondary | ICD-10-CM | POA: Insufficient documentation

## 2017-06-03 DIAGNOSIS — M542 Cervicalgia: Secondary | ICD-10-CM

## 2017-06-03 DIAGNOSIS — K7469 Other cirrhosis of liver: Secondary | ICD-10-CM | POA: Insufficient documentation

## 2017-06-03 NOTE — Assessment & Plan Note (Signed)
Currently controlled hypertension not on medications without dizziness, lightheadedness or syncope.  BP at goal.  Pt is working on lifestyle modifications.   - Current complications: CKD  Plan: 1. RESUME lisinopril 2.5 mg once daily. 2. Obtain labs CMP  3. Encouraged heart healthy diet and increasing exercise to 30 minutes most days of the week. 4. Check BP 1-2 x per week at home, keep log, and bring to clinic at next appointment. 5. Follow up 3 months.

## 2017-06-04 ENCOUNTER — Encounter: Payer: Self-pay | Admitting: Nurse Practitioner

## 2017-06-04 NOTE — Assessment & Plan Note (Signed)
Pt with hepatic steatosis and prior diagnosis of cirrhosis of liver.  Pt without any recent evaluation of lipid panel or liver function.    Plan: 1. Obtain CMP, lipid panel. 2. Continue to avoid hepatotoxic substances. 3. Followup as needed after labs.  Consider referral to GI if needed in future.

## 2017-06-04 NOTE — Assessment & Plan Note (Signed)
Pt with CKD stage II and no current pharmacotherapy.  See HTN above.  Continue followup with Dr. Candiss Norse.

## 2017-06-17 ENCOUNTER — Telehealth: Payer: Self-pay | Admitting: Student

## 2017-06-17 NOTE — Telephone Encounter (Signed)
Pt requested a few more of the diabetes log sheets and will pick them up tomorrow.  Her call back number is [phone]

## 2017-06-18 ENCOUNTER — Ambulatory Visit: Payer: BLUE CROSS/BLUE SHIELD | Admitting: Nurse Practitioner

## 2017-08-27 LAB — BASIC METABOLIC PANEL
BUN: 39 — AB (ref 4–21)
Creatinine: 1.6 — AB (ref <=1.1)
Sodium: 5 — AB (ref 137–147)

## 2017-08-27 LAB — CBC AND DIFFERENTIAL: WBC: 8.9

## 2017-09-09 ENCOUNTER — Other Ambulatory Visit: Payer: Self-pay

## 2017-09-09 ENCOUNTER — Ambulatory Visit: Payer: BLUE CROSS/BLUE SHIELD | Attending: Nurse Practitioner | Admitting: Nurse Practitioner

## 2017-09-09 ENCOUNTER — Encounter: Payer: Self-pay | Admitting: Nurse Practitioner

## 2017-09-09 DIAGNOSIS — K760 Fatty (change of) liver, not elsewhere classified: Secondary | ICD-10-CM | POA: Insufficient documentation

## 2017-09-09 DIAGNOSIS — M5442 Lumbago with sciatica, left side: Secondary | ICD-10-CM | POA: Diagnosis not present

## 2017-09-09 DIAGNOSIS — R748 Abnormal levels of other serum enzymes: Secondary | ICD-10-CM | POA: Diagnosis not present

## 2017-09-09 DIAGNOSIS — M79604 Pain in right leg: Secondary | ICD-10-CM

## 2017-09-09 DIAGNOSIS — Z79899 Other long term (current) drug therapy: Secondary | ICD-10-CM | POA: Diagnosis not present

## 2017-09-09 DIAGNOSIS — I4892 Unspecified atrial flutter: Secondary | ICD-10-CM | POA: Diagnosis not present

## 2017-09-09 DIAGNOSIS — H268 Other specified cataract: Secondary | ICD-10-CM | POA: Insufficient documentation

## 2017-09-09 DIAGNOSIS — E1022 Type 1 diabetes mellitus with diabetic chronic kidney disease: Secondary | ICD-10-CM | POA: Insufficient documentation

## 2017-09-09 DIAGNOSIS — N182 Chronic kidney disease, stage 2 (mild): Secondary | ICD-10-CM | POA: Diagnosis not present

## 2017-09-09 DIAGNOSIS — E10621 Type 1 diabetes mellitus with foot ulcer: Secondary | ICD-10-CM | POA: Insufficient documentation

## 2017-09-09 DIAGNOSIS — K449 Diaphragmatic hernia without obstruction or gangrene: Secondary | ICD-10-CM | POA: Diagnosis not present

## 2017-09-09 DIAGNOSIS — Z79891 Long term (current) use of opiate analgesic: Secondary | ICD-10-CM | POA: Diagnosis not present

## 2017-09-09 DIAGNOSIS — L97509 Non-pressure chronic ulcer of other part of unspecified foot with unspecified severity: Secondary | ICD-10-CM | POA: Insufficient documentation

## 2017-09-09 DIAGNOSIS — M5441 Lumbago with sciatica, right side: Secondary | ICD-10-CM | POA: Insufficient documentation

## 2017-09-09 DIAGNOSIS — M899 Disorder of bone, unspecified: Secondary | ICD-10-CM | POA: Insufficient documentation

## 2017-09-09 DIAGNOSIS — Z789 Other specified health status: Secondary | ICD-10-CM | POA: Diagnosis not present

## 2017-09-09 DIAGNOSIS — M5416 Radiculopathy, lumbar region: Secondary | ICD-10-CM | POA: Insufficient documentation

## 2017-09-09 DIAGNOSIS — G894 Chronic pain syndrome: Secondary | ICD-10-CM | POA: Insufficient documentation

## 2017-09-09 DIAGNOSIS — E1065 Type 1 diabetes mellitus with hyperglycemia: Secondary | ICD-10-CM | POA: Diagnosis not present

## 2017-09-09 DIAGNOSIS — K7460 Unspecified cirrhosis of liver: Secondary | ICD-10-CM | POA: Insufficient documentation

## 2017-09-09 DIAGNOSIS — Z794 Long term (current) use of insulin: Secondary | ICD-10-CM | POA: Insufficient documentation

## 2017-09-09 DIAGNOSIS — M79605 Pain in left leg: Secondary | ICD-10-CM | POA: Diagnosis present

## 2017-09-09 DIAGNOSIS — I129 Hypertensive chronic kidney disease with stage 1 through stage 4 chronic kidney disease, or unspecified chronic kidney disease: Secondary | ICD-10-CM | POA: Insufficient documentation

## 2017-09-09 DIAGNOSIS — M545 Low back pain: Secondary | ICD-10-CM | POA: Diagnosis present

## 2017-09-09 DIAGNOSIS — G8929 Other chronic pain: Secondary | ICD-10-CM

## 2017-09-09 DIAGNOSIS — Z8631 Personal history of diabetic foot ulcer: Secondary | ICD-10-CM | POA: Insufficient documentation

## 2017-09-09 NOTE — Progress Notes (Signed)
Patient's Name: Laurie Lin  MRN: [id number]  Referring Provider: Nadene Rubins, DO  DOB: 1955/07/06  PCP: Mikey College, NP  DOS: 09/09/2017  Note by: Dionisio David NP  Service setting: Ambulatory outpatient  Specialty: Interventional Pain Management  Location: [facility] (AMB) Pain Management Facility    Patient type: New Patient    Primary Reason(s) for Visit: Initial Patient Evaluation CC: Back Pain (lower) and Leg Pain (bilaterally)  HPI  [name] is a 62 y.o. year old, female patient, who comes today for an initial evaluation. She has DKA (diabetic ketoacidoses) ([place]); Type 1 diabetes mellitus with hyperglycemia ([place]); Atrial flutter ([place]); Neuropathy; CKD (chronic kidney disease), stage II; Essential hypertension; Hepatic steatosis; Hiatal hernia; Elevated liver enzymes; Cataracts, bilateral; Albuminuria; Other cirrhosis of liver ([place]); Low back pain; Lumbar radiculopathy; Neck pain; Reactive depression; H/O diabetic foot ulcer; Chronic bilateral low back pain with bilateral sciatica (Primary Area of Pain)((R>L); Chronic pain of both lower extremities (Secondary Area of Pain)(R>L; Chronic pain syndrome; Long term current use of opiate analgesic; Pharmacologic therapy; Disorder of skeletal system; and Problems influencing health status on their problem list.. Her primarily concern today is the Back Pain (lower) and Leg Pain (bilaterally)  Pain Assessment: Location: Lower Back Radiating: denies; pt also c/o leg pain/aching bilaterally to but not including feet Onset: More than a month ago Duration: Chronic pain Quality: Aching, Constant(feels like someone is ripping her back off) Severity: 5 /10 (subjective, self-reported pain score)  Note: Reported level is compatible with observation.                          Effect on ADL: xit has worn pt down Timing: Constant Modifying factors: medications BP: 136/78  HR: 97  Onset and Duration: Gradual and Date of onset: 2016 Cause of  pain: heredity Severity: Getting worse, NAS-11 at its worse: 10/10, NAS-11 at its best: 5/10, NAS-11 now: 6/10 and NAS-11 on the average: 6/10 Timing: Not influenced by the time of the day, During activity or exercise, After activity or exercise and After a period of immobility Aggravating Factors: Bending, Lifiting, Motion, Prolonged sitting, Prolonged standing, Walking, Walking uphill, Walking downhill and Working Alleviating Factors: Stretching, Lying down, Medications, Resting and Sitting Associated Problems: Dizziness, Fatigue, Swelling, Tingling, Weakness, Pain that wakes patient up and Pain that does not allow patient to sleep Quality of Pain: Agonizing, Deep, Lancinating, Splitting and Stabbing Previous Examinations or Tests: MRI scan, X-rays and Orthopedic evaluation Previous Treatments: Narcotic medications  The patient comes into the clinics today for the first time for a chronic pain management evaluation. According to the patient primary area of pain is in her lower back. She denies any previous surgery, interventional therapy. She admits that she did have physical therapy however it was not effective and makes the pain worse. She admits that she had MRI in February. She admits that she was seen in her orthosis because she did not sign the pain contract she was no longer able to be treated at their office.  Her second area of pain is in her legs. She describes it as an aching pain. She denies any numbness or tingling. She admits that she does have occasional weakness. She did have 1 fall. She also admits that she has a history of peripheral neuropathy in her feet secondary to her diabetes. She denies any previous nerve conduction study.  She is concern that she may not be able to have interventional therapy  secondary to diabetes  Today I took the time to provide the patient with information regarding this pain practice. The patient was informed that the practice is divided into two  sections: an interventional pain management section, as well as a completely separate and distinct medication management section. I explained that there are procedure days for interventional therapies, and evaluation days for follow-ups and medication management. Because of the amount of documentation required during both, they are kept separated. This means that there is the possibility that shemay be scheduled for a procedure on one day, and medication management the next. I have also informed her that because of staffing and facility limitations, this practice will no longer take patients for medication management only. To illustrate the reasons for this, I gave the patient the example of surgeons, and how inappropriate it would be to refer a patient to his/her care, just to write for the post-surgical antibiotics on a surgery done by a different surgeon.   Because interventional pain management is part of the board-certified specialty for the doctors, the patient was informed that joining this practice means that they are open to any and all interventional therapies. I made it clear that this does not mean that they will be forced to have any procedures done. What this means is that I believe interventional therapies to be essential part of the diagnosis and proper management of chronic pain conditions. Therefore, patients not interested in these interventional alternatives will be better served under the care of a different practitioner.  The patient was also made aware of my Comprehensive Pain Management Safety Guidelines where by joining this practice, they limit all of their nerve blocks and joint injections to those done by our practice, for as long as we are retained to manage their care. Historic Controlled Substance Pharmacotherapy Review  PMP and historical list of controlled substances: hydrocodone/acetaminophen 5/325 Highest opioid analgesic regimen found: hydrocodone/acetaminophen5/325 one  tablet every 4 hours (fill date 06/25/2017)hydrocodone 30 mg per day Most recent opioid analgesic: hydrocodone/acetaminophen 5/325 one tablet 4 times daily (fill date 07/07/2017)hydrocodone 95m per day Current opioid analgesics: none Highest recorded MME/day: 30 mg/day MME/day: 0 mg/day Medications: The patient did not bring the medication(s) to the appointment, as requested in our "New Patient Package" Pharmacodynamics: Desired effects: Analgesia: The patient reports >50% benefit. Reported improvement in function: The patient reports medication allows her to accomplish basic ADLs. Clinically meaningful improvement in function (CMIF): Sustained CMIF goals met Perceived effectiveness: Described as relatively effective, allowing for increase in activities of daily living (ADL) Undesirable effects: Side-effects or Adverse reactions: None reported Historical Monitoring: The patient  reports that she does not use drugs. List of all UDS Test(s): Lab Results  Component Value Date   ETH <10 03/21/2017   List of all Serum Drug Screening Test(s):  No results found for: AMPHSCRSER, BARBSCRSER, BENZOSCRSER, COCAINSCRSER, PCPSCRSER, PCPQUANT, THCSCRSER, CANNABQUANT, OPIATESCRSER, OXYSCRSER, PROPOXSCRSER Historical Background Evaluation: [place] PDMP: Six (6) year initial data search conducted.             [place] Department of public safety, offender search: (Editor, commissioningInformation) Non-contributory Risk Assessment Profile: Aberrant behavior: None observed or detected today Risk factors for fatal opioid overdose: None identified today Fatal overdose hazard ratio (HR): Calculation deferred Non-fatal overdose hazard ratio (HR): Calculation deferred Risk of opioid abuse or dependence: 0.7-3.0% with doses ? 36 MME/day and 6.1-26% with doses ? 120 MME/day. Substance use disorder (SUD) risk level: Pending results of Medical Psychology Evaluation for SUD Opioid risk tool (  ORT) (Total Score): 0  ORT Scoring  interpretation table:  Score <3 = Low Risk for SUD  Score between 4-7 = Moderate Risk for SUD  Score >8 = High Risk for Opioid Abuse   PHQ-2 Depression Scale:  Total score: 0  PHQ-2 Scoring interpretation table: (Score and probability of major depressive disorder)  Score 0 = No depression  Score 1 = 15.4% Probability  Score 2 = 21.1% Probability  Score 3 = 38.4% Probability  Score 4 = 45.5% Probability  Score 5 = 56.4% Probability  Score 6 = 78.6% Probability   PHQ-9 Depression Scale:  Total score: 0  PHQ-9 Scoring interpretation table:  Score 0-4 = No depression  Score 5-9 = Mild depression  Score 10-14 = Moderate depression  Score 15-19 = Moderately severe depression  Score 20-27 = Severe depression (2.4 times higher risk of SUD and 2.89 times higher risk of overuse)   Pharmacologic Plan: Pending ordered tests and/or consults  Meds  The patient has a current medication list which includes the following prescription(s): acetaminophen, cyclobenzaprine, esomeprazole, furosemide, hydrocodone-acetaminophen, insulin aspart, insulin glargine, lisinopril, multivitamin with minerals, and onetouch delica lancets 06C.  Current Outpatient Medications on File Prior to Visit  Medication Sig  . acetaminophen (TYLENOL) 500 MG tablet Take 1,000 mg by mouth 2 (two) times daily as needed.  . cyclobenzaprine (FLEXERIL) 10 MG tablet Take 10 mg by mouth 3 (three) times daily as needed for muscle spasms.  Marland Kitchen esomeprazole (NEXIUM) 40 MG capsule Take 40 mg by mouth 2 (two) times daily.  . furosemide (LASIX) 20 MG tablet Take 20 mg by mouth daily.  Marland Kitchen HYDROcodone-acetaminophen (NORCO/VICODIN) 5-325 MG tablet Take 1 tablet by mouth every 6 (six) hours as needed for moderate pain.  Marland Kitchen insulin aspart (NOVOLOG) 100 UNIT/ML injection Inject 0-9 Units into the skin 3 (three) times daily with meals. BS 70-120 - 0 units       121-150 - 1 units       151-200 - 2 units        201-250 - 3 units        251-300 - 4  units        301-350 - 5 units        351-400 - 6 units        > 400 call md.  . insulin glargine (LANTUS) 100 UNIT/ML injection Inject 0.07 mLs (7 Units total) into the skin at bedtime. (Patient taking differently: Inject 14 Units into the skin at bedtime. )  . lisinopril (PRINIVIL,ZESTRIL) 2.5 MG tablet Take 1 tablet (2.5 mg total) by mouth daily.  . Multiple Vitamin (MULTIVITAMIN WITH MINERALS) TABS tablet Take 1 tablet by mouth daily.  Glory Rosebush DELICA LANCETS 37S MISC 1 Device by Does not apply route 4 (four) times daily.   No current facility-administered medications on file prior to visit.    Imaging Review  Note: No new results found.        ROS  Cardiovascular History: No reported cardiovascular signs or symptoms such as High blood pressure, coronary artery disease, abnormal heart rate or rhythm, heart attack, blood thinner therapy or heart weakness and/or failure Pulmonary or Respiratory History: No reported pulmonary signs or symptoms such as wheezing and difficulty taking a deep full breath (Asthma), difficulty blowing air out (Emphysema), coughing up mucus (Bronchitis), persistent dry cough, or temporary stoppage of breathing during sleep Neurological History: Abnormal skin sensations (Peripheral Neuropathy) Review of Past Neurological Studies:  Results for orders placed  or performed during the hospital encounter of 03/21/17  CT HEAD WO CONTRAST   Narrative   CLINICAL DATA:  Confusion, acute and unexplained. History of alcohol abuse. DKA with severe metabolic acidosis.  EXAM: CT HEAD WITHOUT CONTRAST  TECHNIQUE: Contiguous axial images were obtained from the base of the skull through the vertex without intravenous contrast.  COMPARISON:  None available (report from 08/04/1997 was reviewed)  FINDINGS: Brain: No evidence of acute infarction, hemorrhage, hydrocephalus, extra-axial collection or mass lesion/mass effect. Moderate atrophy. Chronic small vessel ischemia  with periventricular gliosis.  Vascular: Arterial calcification.  No hyperdense vessel.  Skull: No acute or aggressive finding  Sinuses/Orbits: Bilateral cataract resection.  No acute finding.  IMPRESSION: 1. No acute finding. 2. Chronic small vessel ischemia and moderate atrophy.   Electronically Signed   By: Monte Fantasia M.D.   On: 03/23/2017 12:57    Psychological-Psychiatric History: Difficulty sleeping and or falling asleep Gastrointestinal History: Vomiting blood (Ulcers), Heartburn due to stomach pushing into lungs (Hiatal hernia) and Reflux or heatburn Genitourinary History: Kidney disease Hematological History: No reported hematological signs or symptoms such as prolonged bleeding, low or poor functioning platelets, bruising or bleeding easily, hereditary bleeding problems, low energy levels due to low hemoglobin or being anemic Endocrine History: High blood sugar requiring insulin (IDDM) Rheumatologic History: No reported rheumatological signs and symptoms such as fatigue, joint pain, tenderness, swelling, redness, heat, stiffness, decreased range of motion, with or without associated rash Musculoskeletal History: Negative for myasthenia gravis, muscular dystrophy, multiple sclerosis or malignant hyperthermia Work History: Retired  Allergies  Ms. Luckenbaugh is allergic to latex.  Laboratory Chemistry  Inflammation Markers No results found for: CRP, ESRSEDRATE (CRP: Acute Phase) (ESR: Chronic Phase) Renal Function Markers Lab Results  Component Value Date   BUN 39 (A) 08/27/2017   CREATININE 1.6 (A) 08/27/2017   GFRAA 18 (L) 03/27/2017   GFRNONAA 15 (L) 03/27/2017   Hepatic Function Markers Lab Results  Component Value Date   AST 13 (L) 03/21/2017   ALT 8 (L) 03/21/2017   ALBUMIN 3.1 (L) 03/21/2017   ALKPHOS 55 03/21/2017   Electrolytes Lab Results  Component Value Date   NA 5 (A) 08/27/2017   K 3.6 03/27/2017   CL 107 03/27/2017   CALCIUM 7.5 (L)  03/27/2017   MG 1.7 03/22/2017   Neuropathy Markers No results found for: [id number] Bone Pathology Markers Lab Results  Component Value Date   ALKPHOS 55 03/21/2017   CALCIUM 7.5 (L) 03/27/2017   Coagulation Parameters Lab Results  Component Value Date   INR 0.98 03/23/2017   LABPROT 12.9 03/23/2017   APTT 28 03/23/2017   PLT 197 03/25/2017   Cardiovascular Markers Lab Results  Component Value Date   HGB 7.6 (L) 03/25/2017   HCT 23.3 (L) 03/25/2017   Note: Lab results reviewed.  [place]  Drug: Ms. Gaultney  reports that she does not use drugs. Alcohol:  reports that she drinks alcohol. Tobacco:  reports that she has never smoked. She has never used smokeless tobacco. Medical:  has a past medical history of Diabetes mellitus without complication ([place]), GERD (gastroesophageal reflux disease), Hypertension, and Renal disorder. Family: family history includes Diabetes in her mother.  Past Surgical History:  Procedure Laterality Date  . EYE SURGERY     bilateral caterac  . KNEE ARTHROSCOPY Left 1989   [facility]   Active Ambulatory Problems    Diagnosis Date Noted  . DKA (diabetic ketoacidoses) ([place]) 03/21/2017  . Type 1 diabetes mellitus  with hyperglycemia ([place]) 04/30/2017  . Atrial flutter ([place]) 04/30/2017  . Neuropathy 04/30/2017  . CKD (chronic kidney disease), stage II 04/30/2017  . Essential hypertension 09/28/2014  . Hepatic steatosis 08/17/2013  . Hiatal hernia 04/30/2017  . Elevated liver enzymes 08/17/2013  . Cataracts, bilateral 04/30/2017  . Albuminuria 04/30/2017  . Other cirrhosis of liver ([place]) 06/03/2017  . Low back pain 06/03/2017  . Lumbar radiculopathy 06/03/2017  . Neck pain 06/03/2017  . Reactive depression 01/03/2015  . H/O diabetic foot ulcer 09/09/2017  . Chronic bilateral low back pain with bilateral sciatica (Primary Area of Pain)((R>L) 09/09/2017  . Chronic pain of both lower extremities (Secondary Area of Pain)(R>L 09/09/2017  . Chronic pain  syndrome 09/09/2017  . Long term current use of opiate analgesic 09/09/2017  . Pharmacologic therapy 09/09/2017  . Disorder of skeletal system 09/09/2017  . Problems influencing health status 09/09/2017   Resolved Ambulatory Problems    Diagnosis Date Noted  . No Resolved Ambulatory Problems   Past Medical History:  Diagnosis Date  . Diabetes mellitus without complication ([place])   . GERD (gastroesophageal reflux disease)   . Hypertension   . Renal disorder    Constitutional Exam  General appearance: Well nourished, well developed, and well hydrated. In no apparent acute distress Vitals:   09/09/17 1307  BP: 136/78  Pulse: 97  Temp: 98.1 F (36.7 C)  SpO2: 100%  Weight: 164 lb (74.4 kg)  Height: 5' 10.5" (1.791 m)   BMI Assessment: Estimated body mass index is 23.2 kg/m as calculated from the following:   Height as of this encounter: 5' 10.5" (1.791 m).   Weight as of this encounter: 164 lb (74.4 kg).  BMI interpretation table: BMI level Category Range association with higher incidence of chronic pain  <18 kg/m2 Underweight   18.5-24.9 kg/m2 Ideal body weight   25-29.9 kg/m2 Overweight Increased incidence by 20%  30-34.9 kg/m2 Obese (Class I) Increased incidence by 68%  35-39.9 kg/m2 Severe obesity (Class II) Increased incidence by 136%  >40 kg/m2 Extreme obesity (Class III) Increased incidence by 254%   BMI Readings from Last 4 Encounters:  09/09/17 23.20 kg/m  05/28/17 25.02 kg/m  05/07/17 24.08 kg/m  04/23/17 23.06 kg/m   Wt Readings from Last 4 Encounters:  09/09/17 164 lb (74.4 kg)  05/28/17 169 lb 6.4 oz (76.8 kg)  05/07/17 170 lb 3.2 oz (77.2 kg)  04/23/17 163 lb (73.9 kg)  Psych/Mental status: Alert, oriented x 3 (person, place, & time)       Eyes: PERLA Respiratory: No evidence of acute respiratory distress  Lumbar Spine Exam  Inspection: No masses, redness, or swelling Alignment: Symmetrical Functional ROM: Unrestricted ROM      Stability: No  instability detected Muscle strength & Tone: Functionally intact Sensory: Unimpaired Palpation: Complains of pressure to palpation       Provocative Tests: Lumbar Hyperextension and rotation test: Positive       Patrick's Maneuver: Positive for right-sided S-I arthralgia              Gait & Posture Assessment  Ambulation: Unassisted Gait: Awkward Posture: WNL   Lower Extremity Exam    Side: Right lower extremity  Side: Left lower extremity  Inspection: No masses, redness, swelling, or asymmetry. No contractures  Inspection: No masses, redness, swelling, or asymmetry. No contractures  Functional ROM: Unrestricted ROM          Functional ROM: Unrestricted ROM  Muscle strength & Tone: Functionally intact  Muscle strength & Tone: Functionally intact  Sensory: Unimpaired  Sensory: Unimpaired  Palpation: No palpable anomalies  Palpation: No palpable anomalies   Assessment  Primary Diagnosis & Pertinent Problem List: Diagnoses of Chronic bilateral low back pain with bilateral sciatica (Primary Area of Pain)((R>L), Chronic pain of both lower extremities (Secondary Area of Pain)(R>L, Chronic pain syndrome, Long term current use of opiate analgesic, Pharmacologic therapy, Disorder of skeletal system, and Problems influencing health status were pertinent to this visit.  Visit Diagnosis: 1. Chronic bilateral low back pain with bilateral sciatica (Primary Area of Pain)((R>L)   2. Chronic pain of both lower extremities (Secondary Area of Pain)(R>L   3. Chronic pain syndrome   4. Long term current use of opiate analgesic   5. Pharmacologic therapy   6. Disorder of skeletal system   7. Problems influencing health status    Plan of Care  Initial treatment plan:  Please be advised that as per protocol, today's visit has been an evaluation only. We have not taken over the patient's controlled substance management.  Problem-specific plan: No problem-specific Assessment & Plan notes found  for this encounter.  Ordered Lab-work, Procedure(s), Referral(s), & Consult(s): Orders Placed This Encounter  Procedures  . Compliance Drug Analysis, Ur  . Magnesium  . Vitamin B12  . Sedimentation rate  . 25-Hydroxyvitamin D Lcms D2+D3  . C-reactive protein  . Ambulatory referral to Psychology   Pharmacotherapy: Medications ordered:  No orders of the defined types were placed in this encounter.  Medications administered during this visit: Bonnetta Barry had no medications administered during this visit.   Pharmacotherapy under consideration:  Opioid Analgesics: The patient was informed that there is no guarantee that she would be a candidate for opioid analgesics. The decision will be made following CDC guidelines. This decision will be based on the results of diagnostic studies, as well as Ms. Seki risk profile.  Membrane stabilizer: To be determined at a later time Muscle relaxant: To be determined at a later time NSAID: To be determined at a later time Other analgesic(s): To be determined at a later time   Interventional therapies under consideration: Ms. Pirie was informed that there is no guarantee that she would be a candidate for interventional therapies. The decision will be based on the results of diagnostic studies, as well as Ms. Min risk profile.  Possible procedure(s): Diagnostic bilateral lumbar facet nerve block no steroid Possible bilateral lumbar facet RFA   Provider-requested follow-up: Return for 2nd Visit, w/ Dr. Dossie Arbour, after [facility] eval.  No future appointments.  Primary Care Physician: Mikey College, NP Location: [facility] Outpatient Pain Management Facility Note by:  Date: 09/09/2017; Time: 6:38 PM  Pain Score Disclaimer: We use the NRS-11 scale. This is a self-reported, subjective measurement of pain severity with only modest accuracy. It is used primarily to identify changes within a particular patient. It must be understood  that outpatient pain scales are significantly less accurate that those used for research, where they can be applied under ideal controlled circumstances with minimal exposure to variables. In reality, the score is likely to be a combination of pain intensity and pain affect, where pain affect describes the degree of emotional arousal or changes in action readiness caused by the sensory experience of pain. Factors such as social and work situation, setting, emotional state, anxiety levels, expectation, and prior pain experience may influence pain perception and show large inter-individual differences that may also be affected  by time variables.  Patient instructions provided during this appointment: Patient Instructions   ____________________________________________________________________________________________  Appointment Policy Summary  It is our goal and responsibility to provide the medical community with assistance in the evaluation and management of patients with chronic pain. Unfortunately our resources are limited. Because we do not have an unlimited amount of time, or available appointments, we are required to closely monitor and manage their use. The following rules exist to maximize their use:  Patient's responsibilities: 1. Punctuality:  At what time should I arrive? You should be physically present in our office 30 minutes before your scheduled appointment. Your scheduled appointment is with your assigned healthcare provider. However, it takes 5-10 minutes to be "checked-in", and another 15 minutes for the nurses to do the admission. If you arrive to our office at the time you were given for your appointment, you will end up being at least 20-25 minutes late to your appointment with the provider. 2. Tardiness:  What happens if I arrive only a few minutes after my scheduled appointment time? You will need to reschedule your appointment. The cutoff is your appointment time. This is why it  is so important that you arrive at least 30 minutes before that appointment. If you have an appointment scheduled for 10:00 AM and you arrive at 10:01, you will be required to reschedule your appointment.  3. Plan ahead:  Always assume that you will encounter traffic on your way in. Plan for it. If you are dependent on a driver, make sure they understand these rules and the need to arrive early. 4. Other appointments and responsibilities:  Avoid scheduling any other appointments before or after your pain clinic appointments.  5. Be prepared:  Write down everything that you need to discuss with your healthcare provider and give this information to the admitting nurse. Write down the medications that you will need refilled. Bring your pills and bottles (even the empty ones), to all of your appointments, except for those where a procedure is scheduled. 6. No children or pets:  Find someone to take care of them. It is not appropriate to bring them in. 7. Scheduling changes:  We request "advanced notification" of any changes or cancellations. 8. Advanced notification:  Defined as a time period of more than 24 hours prior to the originally scheduled appointment. This allows for the appointment to be offered to other patients. 9. Rescheduling:  When a visit is rescheduled, it will require the cancellation of the original appointment. For this reason they both fall within the category of "Cancellations".  10. Cancellations:  They require advanced notification. Any cancellation less than 24 hours before the  appointment will be recorded as a "No Show". 11. No Show:  Defined as an unkept appointment where the patient failed to notify or declare to the practice their intention or inability to keep the appointment.  Corrective process for repeat offenders:  1. Tardiness: Three (3) episodes of rescheduling due to late arrivals will be recorded as one (1) "No Show". 2. Cancellation or reschedule: Three (3)  cancellations or rescheduling will be recorded as one (1) "No Show". 3. "No Shows": Three (3) "No Shows" within a 12 month period will result in discharge from the practice. ____________________________________________________________________________________________  ____________________________________________________________________________________________  Pain Scale  Introduction: The pain score used by this practice is the Verbal Numerical Rating Scale (VNRS-11). This is an 11-point scale. It is for adults and children 10 years or older. There are significant differences in how the pain  score is reported, used, and applied. Forget everything you learned in the past and learn this scoring system.  General Information: The scale should reflect your current level of pain. Unless you are specifically asked for the level of your worst pain, or your average pain. If you are asked for one of these two, then it should be understood that it is over the past 24 hours.  Basic Activities of Daily Living (ADL): Personal hygiene, dressing, eating, transferring, and using restroom.  Instructions: Most patients tend to report their level of pain as a combination of two factors, their physical pain and their psychosocial pain. This last one is also known as "suffering" and it is reflection of how physical pain affects you socially and psychologically. From now on, report them separately. From this point on, when asked to report your pain level, report only your physical pain. Use the following table for reference.  Pain Clinic Pain Levels (0-5/10)  Pain Level Score  Description  No Pain 0   Mild pain 1 Nagging, annoying, but does not interfere with basic activities of daily living (ADL). Patients are able to eat, bathe, get dressed, toileting (being able to get on and off the toilet and perform personal hygiene functions), transfer (move in and out of bed or a chair without assistance), and maintain  continence (able to control bladder and bowel functions). Blood pressure and heart rate are unaffected. A normal heart rate for a healthy adult ranges from 60 to 100 bpm (beats per minute).   Mild to moderate pain 2 Noticeable and distracting. Impossible to hide from other people. More frequent flare-ups. Still possible to adapt and function close to normal. It can be very annoying and may have occasional stronger flare-ups. With discipline, patients may get used to it and adapt.   Moderate pain 3 Interferes significantly with activities of daily living (ADL). It becomes difficult to feed, bathe, get dressed, get on and off the toilet or to perform personal hygiene functions. Difficult to get in and out of bed or a chair without assistance. Very distracting. With effort, it can be ignored when deeply involved in activities.   Moderately severe pain 4 Impossible to ignore for more than a few minutes. With effort, patients may still be able to manage work or participate in some social activities. Very difficult to concentrate. Signs of autonomic nervous system discharge are evident: dilated pupils (mydriasis); mild sweating (diaphoresis); sleep interference. Heart rate becomes elevated (>115 bpm). Diastolic blood pressure (lower number) rises above 100 mmHg. Patients find relief in laying down and not moving.   Severe pain 5 Intense and extremely unpleasant. Associated with frowning face and frequent crying. Pain overwhelms the senses.  Ability to do any activity or maintain social relationships becomes significantly limited. Conversation becomes difficult. Pacing back and forth is common, as getting into a comfortable position is nearly impossible. Pain wakes you up from deep sleep. Physical signs will be obvious: pupillary dilation; increased sweating; goosebumps; brisk reflexes; cold, clammy hands and feet; nausea, vomiting or dry heaves; loss of appetite; significant sleep disturbance with inability to  fall asleep or to remain asleep. When persistent, significant weight loss is observed due to the complete loss of appetite and sleep deprivation.  Blood pressure and heart rate becomes significantly elevated. Caution: If elevated blood pressure triggers a pounding headache associated with blurred vision, then the patient should immediately seek attention at an urgent or emergency care unit, as these may be signs of an impending  stroke.    Emergency Department Pain Levels (6-10/10)  Emergency Room Pain 6 Severely limiting. Requires emergency care and should not be seen or managed at an outpatient pain management facility. Communication becomes difficult and requires great effort. Assistance to reach the emergency department may be required. Facial flushing and profuse sweating along with potentially dangerous increases in heart rate and blood pressure will be evident.   Distressing pain 7 Self-care is very difficult. Assistance is required to transport, or use restroom. Assistance to reach the emergency department will be required. Tasks requiring coordination, such as bathing and getting dressed become very difficult.   Disabling pain 8 Self-care is no longer possible. At this level, pain is disabling. The individual is unable to do even the most "basic" activities such as walking, eating, bathing, dressing, transferring to a bed, or toileting. Fine motor skills are lost. It is difficult to think clearly.   Incapacitating pain 9 Pain becomes incapacitating. Thought processing is no longer possible. Difficult to remember your own name. Control of movement and coordination are lost.   The worst pain imaginable 10 At this level, most patients pass out from pain. When this level is reached, collapse of the autonomic nervous system occurs, leading to a sudden drop in blood pressure and heart rate. This in turn results in a temporary and dramatic drop in blood flow to the brain, leading to a loss of  consciousness. Fainting is one of the body's self defense mechanisms. Passing out puts the brain in a calmed state and causes it to shut down for a while, in order to begin the healing process.    Summary: 1. Refer to this scale when providing [place] with your pain level. 2. Be accurate and careful when reporting your pain level. This will help with your care. 3. Over-reporting your pain level will lead to loss of credibility. 4. Even a level of 1/10 means that there is pain and will be treated at our facility. 5. High, inaccurate reporting will be documented as "Symptom Exaggeration", leading to loss of credibility and suspicions of possible secondary gains such as obtaining more narcotics, or wanting to appear disabled, for fraudulent reasons. 6. Only pain levels of 5 or below will be seen at our facility. 7. Pain levels of 6 and above will be sent to the Emergency Department and the appointment cancelled. ____________________________________________________________________________________________

## 2017-09-09 NOTE — Progress Notes (Signed)
Safety precautions to be maintained throughout the outpatient stay will include: orient to surroundings, keep bed in low position, maintain call bell within reach at all times, provide assistance with transfer out of bed and ambulation.  

## 2017-09-09 NOTE — Patient Instructions (Signed)

## 2017-09-13 LAB — C-REACTIVE PROTEIN: CRP: 1.1 mg/L (ref 0.0–4.9)

## 2017-09-13 LAB — 25-HYDROXY VITAMIN D LCMS D2+D3
25-Hydroxy, Vitamin D-2: <1 ng/mL
25-Hydroxy, Vitamin D-3: 31 ng/mL
25-Hydroxy, Vitamin D: 31 ng/mL

## 2017-09-13 LAB — MAGNESIUM: MAGNESIUM: 1.6 mg/dL (ref 1.6–2.3)

## 2017-09-13 LAB — SEDIMENTATION RATE: SED RATE: 20 mm/h (ref 0–40)

## 2017-09-13 LAB — VITAMIN B12: Vitamin B-12: 307 pg/mL (ref 232–1245)

## 2017-09-17 LAB — COMPLIANCE DRUG ANALYSIS, UR

## 2017-09-29 NOTE — Progress Notes (Signed)
Patient's Name: Laurie Lin  MRN: [id number]  Referring Provider: Mikey College, *  DOB: [birth year]  PCP: Mikey College, NP  DOS: 09/30/2017  Note by: Gaspar Cola, MD  Service setting: Ambulatory outpatient  Specialty: Interventional Pain Management  Location: [facility] (AMB) Pain Management Facility    Patient type: Established   Primary Reason(s) for Visit: Encounter for evaluation before starting new chronic pain management plan of Lin (Level of risk: moderate) CC: Back Pain (lower)  HPI  Laurie Lin is a 62 y.o. year old, female patient, who comes today for a follow-up evaluation to review the test results and decide on a treatment plan. She has DKA (diabetic ketoacidoses) ([place]); Type 1 diabetes mellitus with hyperglycemia ([place]); Atrial flutter ([place]); Neuropathy; Essential hypertension; Hepatic steatosis; Hiatal hernia; Elevated liver enzymes; Cataracts, bilateral; Albuminuria; Other cirrhosis of liver ([place]); Chronic lumbar radiculopathy; Chronic neck pain; Reactive depression; H/O diabetic foot ulcer; Chronic low back pain (Primary Area of Pain) (Bilateral) (R>L) w/ sciatica (Bilateral); Chronic lower extremity pain (Secondary Area of Pain) (Bilateral) (R>L); Chronic pain syndrome; Long term current use of opiate analgesic; Pharmacologic therapy; Disorder of skeletal system; Problems influencing health status; Neurogenic pain; Anemia; Hyponatremia; Hypocalcemia; Hypoalbuminemia; Diabetes mellitus with stage 4 chronic kidney disease GFR 15-29 ([place]); Diabetic peripheral neuropathy ([place]); Chronic pain of hip (B); Chronic sacroiliac joint pain (B); and Lumbar facet joint syndrome on their problem list. Her primarily concern today is the Back Pain (lower)  Pain Assessment: Location: Lower Back Radiating: Radiates down both legs Onset: More than a month ago Duration: Chronic pain Quality: Aching, Constant Severity: 4 /10 (subjective, self-reported pain score)  Note: Reported  level is compatible with observation.                         When using our objective Pain Scale, levels between 6 and 10/10 are said to belong in an emergency room, as it progressively worsens from a 6/10, described as severely limiting, requiring emergency Lin not usually available at an outpatient pain management facility. At a 6/10 level, communication becomes difficult and requires great effort. Assistance to reach the emergency department may be required. Facial flushing and profuse sweating along with potentially dangerous increases in heart rate and blood pressure will be evident. Effect on ADL: limits daily activities and uable to sleep at night Timing: Constant Modifying factors: tylenol  BP: (!) 142/82  HR: (!) 106  Laurie Lin comes in today for a follow-up visit after her initial evaluation on 09/09/2017. Today we went over the results of her tests. These were explained in "Layman's terms". During today's appointment we went over my diagnostic impression, as well as the proposed treatment plan.  According to the patient primary area of pain is in her lower back. She denies any previous surgery, interventional therapy. She admits that she did have physical therapy however it was not effective and makes the pain worse. She admits that she had MRI in February. She admits that she was seen in her orthosis because she did not sign the pain contract she was no longer able to be treated at their office.  Her second area of pain is in her legs. She describes it as an aching pain. She denies any numbness or tingling. She admits that she does have occasional weakness. She did have 1 fall. She also admits that she has a history of peripheral neuropathy in her feet secondary to her diabetes. She denies any  previous nerve conduction study.  She is concern that she may not be able to have interventional therapy secondary to diabetes  In considering the treatment plan options, Laurie Lin was  reminded that I no longer take patients for medication management only. I asked her to let me know if she had no intention of taking advantage of the interventional therapies, so that we could make arrangements to provide this space to someone interested. I also made it clear that undergoing interventional therapies for the purpose of getting pain medications is very inappropriate on the part of a patient, and it will not be tolerated in this practice. This type of behavior would suggest true addiction and therefore it requires referral to an addiction specialist.   Further details on both, my assessment(s), as well as the proposed treatment plan, please see below.  Controlled Substance Pharmacotherapy Assessment REMS (Risk Evaluation and Mitigation Strategy)  Analgesic: none Highest recorded MME/day: 30 mg/day MME/day: 0 mg/day Pill Count: None expected due to no prior prescriptions written by our practice. No notes on file Pharmacokinetics: Liberation and absorption (onset of action): WNL Distribution (time to peak effect): WNL Metabolism and excretion (duration of action): WNL         Pharmacodynamics: Desired effects: Analgesia: Ms. Fana reports >50% benefit. Functional ability: Patient reports that medication allows her to accomplish basic ADLs Clinically meaningful improvement in function (CMIF): Sustained CMIF goals met Perceived effectiveness: Described as relatively effective, allowing for increase in activities of daily living (ADL) Undesirable effects: Side-effects or Adverse reactions: None reported Monitoring: [place] PMP: Online review of the past 76-monthperiod previously conducted. Not applicable at this point since we have not taken over the patient's medication management yet. List of other Serum/Urine Drug Screening Test(s):  Lab Results  Component Value Date   ETH <10 03/21/2017   List of all UDS test(s) done:  Lab Results  Component Value Date   SUMMARY FINAL  09/09/2017   Last UDS on record: Summary  Date Value Ref Range Status  09/09/2017 FINAL  Final    Comment:    ==================================================================== TOXASSURE COMP DRUG ANALYSIS,UR ==================================================================== Test                             Result       Flag       Units Drug Present and Declared for Prescription Verification   Cyclobenzaprine                PRESENT      EXPECTED   Desmethylcyclobenzaprine       PRESENT      EXPECTED    Desmethylcyclobenzaprine is an expected metabolite of    cyclobenzaprine.   Acetaminophen                  PRESENT      EXPECTED Drug Absent but Declared for Prescription Verification   Hydrocodone                    Not Detected UNEXPECTED ng/mg creat ==================================================================== Test                      Result    Flag   Units      Ref Range   Creatinine              65  mg/dL      >=20 ==================================================================== Declared Medications:  The flagging and interpretation on this report are based on the  following declared medications.  Unexpected results may arise from  inaccuracies in the declared medications.  **Note: The testing scope of this panel includes these medications:  Cyclobenzaprine (Flexeril)  Hydrocodone (Norco)  **Note: The testing scope of this panel does not include small to  moderate amounts of these reported medications:  Acetaminophen  Acetaminophen (Norco)  **Note: The testing scope of this panel does not include following  reported medications:  Furosemide  Insulin (Lantus)  Insulin (NovoLog)  Lisinopril  Multivitamin  Omeprazole (Nexium) ==================================================================== For clinical consultation, please call [phone]. ====================================================================    UDS interpretation:  Unexpected findings not considered significantly abnormal. Absence of medication may be associated with time of sampling in relation to last PRN intake. Medication Assessment Form: Patient introduced to form today Treatment compliance: Treatment may start today if patient agrees with proposed plan. Evaluation of compliance is not applicable at this point Risk Assessment Profile: Aberrant behavior: See initial evaluations. None observed or detected today Comorbid factors increasing risk of overdose: See initial evaluation. No additional risks detected today Medical Psychology Evaluation: Please see scanned results in medical record. Opioid Risk Tool - 09/09/17 1331      Family History of Substance Abuse   Alcohol  Negative    Illegal Drugs  Negative    Rx Drugs  Negative      Personal History of Substance Abuse   Alcohol  Negative    Illegal Drugs  Negative    Rx Drugs  Negative      Age   Age between 64-45 years   No      History of Preadolescent Sexual Abuse   History of Preadolescent Sexual Abuse  Negative or Female      Psychological Disease   Psychological Disease  Negative    Depression  Negative      Total Score   Opioid Risk Tool Scoring  0    Opioid Risk Interpretation  Low Risk      ORT Scoring interpretation table:  Score <3 = Low Risk for SUD  Score between 4-7 = Moderate Risk for SUD  Score >8 = High Risk for Opioid Abuse   Risk Mitigation Strategies:  Patient opioid safety counseling: Completed today. Counseling provided to patient as per "Patient Counseling Document". Document signed by patient, attesting to counseling and understanding Patient-Prescriber Agreement (PPA): Obtained today.  Controlled substance notification to other providers: Written and sent today.  Pharmacologic Plan: Today we may be taking over the patient's pharmacological regimen. See below.             Laboratory Chemistry  Inflammation Markers (CRP: Acute Phase) (ESR: Chronic  Phase) Lab Results  Component Value Date   CRP 1.1 09/09/2017   ESRSEDRATE 20 09/09/2017   LATICACIDVEN 1.6 03/21/2017                         Renal Function Markers Lab Results  Component Value Date   BUN 39 (A) 08/27/2017   CREATININE 1.6 (A) 08/27/2017   GFRAA 18 (L) 03/27/2017   GFRNONAA 15 (L) 03/27/2017                             Hepatic Function Markers Lab Results  Component Value Date   AST 13 (L) 03/21/2017   ALT  8 (L) 03/21/2017   ALBUMIN 3.1 (L) 03/21/2017   ALKPHOS 55 03/21/2017   AMMONIA 20 03/22/2017                        Electrolytes Lab Results  Component Value Date   NA 5 (A) 08/27/2017   K 3.6 03/27/2017   CL 107 03/27/2017   CALCIUM 7.5 (L) 03/27/2017   MG 1.6 09/09/2017   PHOS 3.3 03/24/2017                        Neuropathy Markers Lab Results  Component Value Date   VITAMINB12 307 09/09/2017   HGBA1C 12.3 (H) 03/23/2017                        Bone Pathology Markers Lab Results  Component Value Date   25OHVITD1 31 09/09/2017   25OHVITD2 <1.0 09/09/2017   25OHVITD3 31 09/09/2017                         Coagulation Parameters Lab Results  Component Value Date   INR 0.98 03/23/2017   LABPROT 12.9 03/23/2017   APTT 28 03/23/2017   PLT 197 03/25/2017                        Cardiovascular Markers Lab Results  Component Value Date   TROPONINI <0.03 03/21/2017   HGB 7.6 (L) 03/25/2017   HCT 23.3 (L) 03/25/2017                         Note: Lab results reviewed.  Recent Diagnostic Imaging Review   Complexity Note: No results found under the [facility] electronic medical record. No pertinent imaging found under "Lin everywhere".                  Meds   Current Outpatient Medications:  .  acetaminophen (TYLENOL) 500 MG tablet, Take 1,000 mg by mouth 2 (two) times daily as needed., Disp: , Rfl:  .  cyclobenzaprine (FLEXERIL) 10 MG tablet, Take 10 mg by mouth 3 (three) times daily as needed for muscle spasms., Disp: ,  Rfl:  .  esomeprazole (NEXIUM) 40 MG capsule, Take 40 mg by mouth 2 (two) times daily., Disp: , Rfl: 1 .  furosemide (LASIX) 20 MG tablet, Take 20 mg by mouth daily., Disp: , Rfl:  .  gabapentin (NEURONTIN) 100 MG capsule, Take 1-3 capsules (100-300 mg total) by mouth at bedtime. Follow written titration schedule., Disp: 90 capsule, Rfl: 1 .  insulin aspart (NOVOLOG) 100 UNIT/ML injection, Inject 0-9 Units into the skin 3 (three) times daily with meals. BS 70-120 - 0 units       121-150 - 1 units       151-200 - 2 units        201-250 - 3 units        251-300 - 4 units        301-350 - 5 units        351-400 - 6 units        > 400 call md., Disp: 10 mL, Rfl: 11 .  insulin glargine (LANTUS) 100 UNIT/ML injection, Inject 0.07 mLs (7 Units total) into the skin at bedtime. (Patient taking differently: Inject 14 Units into the skin at bedtime. ), Disp: 10 mL, Rfl: 11 .  lisinopril (PRINIVIL,ZESTRIL) 2.5 MG tablet, Take 1 tablet (2.5 mg total) by mouth daily., Disp: 30 tablet, Rfl: 5 .  Multiple Vitamin (MULTIVITAMIN WITH MINERALS) TABS tablet, Take 1 tablet by mouth daily., Disp: , Rfl:  .  ONETOUCH DELICA LANCETS 41Y MISC, 1 Device by Does not apply route 4 (four) times daily., Disp: 100 each, Rfl: 11  ROS  Constitutional: Denies any fever or chills Gastrointestinal: No reported hemesis, hematochezia, vomiting, or acute GI distress Musculoskeletal: Denies any acute onset joint swelling, redness, loss of ROM, or weakness Neurological: No reported episodes of acute onset apraxia, aphasia, dysarthria, agnosia, amnesia, paralysis, loss of coordination, or loss of consciousness  Allergies  Laurie Lin is allergic to ibuprofen and latex.  [place]  Drug: Laurie Lin  reports that she does not use drugs. Alcohol:  reports that she drinks alcohol. Tobacco:  reports that she has never smoked. She has never used smokeless tobacco. Medical:  has a past medical history of Diabetes mellitus without complication  ([place]), GERD (gastroesophageal reflux disease), Hypertension, and Renal disorder. Surgical: Laurie Lin  has a past surgical history that includes Eye surgery and Knee arthroscopy (Left, 1989). Family: family history includes Diabetes in her mother.  Constitutional Exam  General appearance: Well nourished, well developed, and well hydrated. In no apparent acute distress Vitals:   09/30/17 0817  BP: (!) 142/82  Pulse: (!) 106  Temp: 97.8 F (36.6 C)  SpO2: 100%  Weight: 164 lb (74.4 kg)  Height: _0  (1.778 m)   BMI Assessment: Estimated body mass index is 23.53 kg/m as calculated from the following:   Height as of this encounter: _1  (1.778 m).   Weight as of this encounter: 164 lb (74.4 kg).  BMI interpretation table: BMI level Category Range association with higher incidence of chronic pain  <18 kg/m2 Underweight   18.5-24.9 kg/m2 Ideal body weight   25-29.9 kg/m2 Overweight Increased incidence by 20%  30-34.9 kg/m2 Obese (Class I) Increased incidence by 68%  35-39.9 kg/m2 Severe obesity (Class II) Increased incidence by 136%  >40 kg/m2 Extreme obesity (Class III) Increased incidence by 254%   Patient's current BMI Ideal Body weight  Body mass index is 23.53 kg/m. Ideal body weight: 68.5 kg (151 lb 0.2 oz) Adjusted ideal body weight: 70.9 kg (156 lb 3.3 oz)   BMI Readings from Last 4 Encounters:  09/30/17 23.53 kg/m  09/09/17 23.20 kg/m  05/28/17 25.02 kg/m  05/07/17 24.08 kg/m   Wt Readings from Last 4 Encounters:  09/30/17 164 lb (74.4 kg)  09/09/17 164 lb (74.4 kg)  05/28/17 169 lb 6.4 oz (76.8 kg)  05/07/17 170 lb 3.2 oz (77.2 kg)  Psych/Mental status: Alert, oriented x 3 (person, place, & time)       Eyes: PERLA Respiratory: No evidence of acute respiratory distress  Cervical Spine Area Exam  Skin & Axial Inspection: No masses, redness, edema, swelling, or associated skin lesions Alignment: Symmetrical Functional ROM: Unrestricted ROM       Stability: No instability detected Muscle Tone/Strength: Functionally intact. No obvious neuro-muscular anomalies detected. Sensory (Neurological): Unimpaired Palpation: No palpable anomalies              Upper Extremity (UE) Exam    Side: Right upper extremity  Side: Left upper extremity  Skin & Extremity Inspection: Skin color, temperature, and hair growth are WNL. No peripheral edema or cyanosis. No masses, redness, swelling, asymmetry, or associated skin lesions. No contractures.  Skin & Extremity Inspection: Skin color,  temperature, and hair growth are WNL. No peripheral edema or cyanosis. No masses, redness, swelling, asymmetry, or associated skin lesions. No contractures.  Functional ROM: Unrestricted ROM          Functional ROM: Unrestricted ROM          Muscle Tone/Strength: Functionally intact. No obvious neuro-muscular anomalies detected.  Muscle Tone/Strength: Functionally intact. No obvious neuro-muscular anomalies detected.  Sensory (Neurological): Unimpaired          Sensory (Neurological): Unimpaired          Palpation: No palpable anomalies              Palpation: No palpable anomalies              Provocative Test(s):  Phalen's test: deferred Tinel's test: deferred Apley's scratch test (touch opposite shoulder):  Action 1 (Across chest): deferred Action 2 (Overhead): deferred Action 3 (LB reach): deferred   Provocative Test(s):  Phalen's test: deferred Tinel's test: deferred Apley's scratch test (touch opposite shoulder):  Action 1 (Across chest): deferred Action 2 (Overhead): deferred Action 3 (LB reach): deferred    Thoracic Spine Area Exam  Skin & Axial Inspection: No masses, redness, or swelling Alignment: Symmetrical Functional ROM: Unrestricted ROM Stability: No instability detected Muscle Tone/Strength: Functionally intact. No obvious neuro-muscular anomalies detected. Sensory (Neurological): Unimpaired Muscle strength & Tone: No palpable  anomalies  Lumbar Spine Area Exam  Skin & Axial Inspection: No masses, redness, or swelling Alignment: Symmetrical Functional ROM: Decreased ROM affecting both sides Stability: No instability detected Muscle Tone/Strength: Functionally intact. No obvious neuro-muscular anomalies detected. Sensory (Neurological): Movement-associated pain Palpation: Complains of area being tender to palpation       Provocative Tests: Lumbar Hyperextension/rotation test: (+) bilaterally for facet joint pain. Lumbar quadrant test (Kemp's test): (+) bilaterally for facet joint pain. Lumbar Lateral bending test: deferred today       Patrick's Maneuver: (+) for bilateral S-I arthralgia             FABER test: (+) for bilateral S-I arthralgia Thigh-thrust test: deferred today       S-I compression test: deferred today       S-I distraction test: deferred today        Gait & Posture Assessment  Ambulation: Limited Gait: Antalgic Posture: Antalgic   Lower Extremity Exam    Side: Right lower extremity  Side: Left lower extremity  Stability: No instability observed          Stability: No instability observed          Skin & Extremity Inspection: Skin color, temperature, and hair growth are WNL. No peripheral edema or cyanosis. No masses, redness, swelling, asymmetry, or associated skin lesions. No contractures.  Skin & Extremity Inspection: Skin color, temperature, and hair growth are WNL. No peripheral edema or cyanosis. No masses, redness, swelling, asymmetry, or associated skin lesions. No contractures.  Functional ROM: Unrestricted ROM                  Functional ROM: Unrestricted ROM                  Muscle Tone/Strength: Functionally intact. No obvious neuro-muscular anomalies detected.  Muscle Tone/Strength: Functionally intact. No obvious neuro-muscular anomalies detected.  Sensory (Neurological): Unimpaired  Sensory (Neurological): Unimpaired  Palpation: No palpable anomalies  Palpation: No palpable  anomalies   Assessment & Plan  Primary Diagnosis & Pertinent Problem List: The primary encounter diagnosis was Chronic pain syndrome. Diagnoses of Chronic  low back pain (Primary Area of Pain) (Bilateral) (R>L) w/ sciatica (Bilateral), Lumbar facet joint syndrome, Chronic sacroiliac joint pain (B), Chronic lower extremity pain (Secondary Area of Pain) (Bilateral) (R>L), Chronic lumbar radiculopathy, Chronic neck pain, Chronic pain of hip (B), Diabetic peripheral neuropathy ([place]), Neurogenic pain, Neuropathy, Anemia, unspecified type, Hyponatremia, Hypocalcemia, Hypoalbuminemia, Other cirrhosis of liver ([place]), and Diabetes mellitus with stage 4 chronic kidney disease GFR 15-29 ([place]) were also pertinent to this visit.  Visit Diagnosis: 1. Chronic pain syndrome   2. Chronic low back pain (Primary Area of Pain) (Bilateral) (R>L) w/ sciatica (Bilateral)   3. Lumbar facet joint syndrome   4. Chronic sacroiliac joint pain (B)   5. Chronic lower extremity pain (Secondary Area of Pain) (Bilateral) (R>L)   6. Chronic lumbar radiculopathy   7. Chronic neck pain   8. Chronic pain of hip (B)   9. Diabetic peripheral neuropathy ([facility])   10. Neurogenic pain   11. Neuropathy   12. Anemia, unspecified type   13. Hyponatremia   14. Hypocalcemia   15. Hypoalbuminemia   16. Other cirrhosis of liver ([place])   17. Diabetes mellitus with stage 4 chronic kidney disease GFR 15-29 ([place])    Problems updated and reviewed during this visit: No problems updated. Time Note: Greater than 50% of the 40 minute(s) of face-to-face time spent with Laurie Lin, was spent in counseling/coordination of Lin regarding: the appropriate use of the pain scale, Laurie Lin primary cause of pain, the results of her recent test(s), the significance of each one oth the test(s) anomalies and it's corresponding characteristic pain pattern(s), the treatment plan, treatment alternatives, the risks and possible complications of proposed  treatment, the opioid analgesic risks and possible complications, realistic expectations, the goals of pain management (increased in functionality) and the need to collect and read the AVS material.  Plan of Lin  Pharmacotherapy (Medications Ordered): Meds ordered this encounter  Medications  . gabapentin (NEURONTIN) 100 MG capsule    Sig: Take 1-3 capsules (100-300 mg total) by mouth at bedtime. Follow written titration schedule.    Dispense:  90 capsule    Refill:  1    Do not place medication on "Automatic Refill". Fill one day early if pharmacy is closed on scheduled refill date.   Procedure Orders    No procedure(s) ordered today    Lab Orders     Comp. Metabolic Panel (12)  Imaging Orders     DG Lumbar Spine Complete W/Bend     DG HIP UNILAT W OR W/O PELVIS 2-3 VIEWS RIGHT     DG HIP UNILAT W OR W/O PELVIS 2-3 VIEWS LEFT     DG Si Joints Referral Orders  No referral(s) requested today    Pharmacological management options:  Opioid Analgesics: We'll take over management today. See above orders Membrane stabilizer: We have discussed the possibility of optimizing this mode of therapy, if tolerated Muscle relaxant: We have discussed the possibility of a trial NSAID: We have discussed the possibility of a trial Other analgesic(s): To be determined at a later time   Interventional management options: Planned, scheduled, and/or pending:    Diagnostic bilateral lumbar facet nerve block (NO STEROIDS) #1    Considering:   NOTE: Avoid Steroids due to DM & Hx of DKA  Diagnostic bilateral lumbar facet nerve block no steroid  Possible bilateral lumbar facet RFA    PRN Procedures:   None at this time   Provider-requested follow-up: Return for Procedure (w/ sedation): (  B) L-FCt BLK (w/o STEROIDS).  No future appointments.  Primary Lin Physician: Mikey College, NP Location: [facility] Note by: Gaspar Cola, MD Date: 09/30/2017;  Time: 9:45 AM

## 2017-09-30 ENCOUNTER — Ambulatory Visit: Payer: BLUE CROSS/BLUE SHIELD | Attending: Pain Medicine | Admitting: Pain Medicine

## 2017-09-30 ENCOUNTER — Encounter: Payer: Self-pay | Admitting: Pain Medicine

## 2017-09-30 ENCOUNTER — Other Ambulatory Visit: Payer: Self-pay

## 2017-09-30 VITALS — BP 142/82 | HR 106 | Temp 97.8°F | Ht 70.0 in | Wt 164.0 lb

## 2017-09-30 DIAGNOSIS — M792 Neuralgia and neuritis, unspecified: Secondary | ICD-10-CM | POA: Diagnosis not present

## 2017-09-30 DIAGNOSIS — Z794 Long term (current) use of insulin: Secondary | ICD-10-CM | POA: Insufficient documentation

## 2017-09-30 DIAGNOSIS — G894 Chronic pain syndrome: Secondary | ICD-10-CM | POA: Diagnosis not present

## 2017-09-30 DIAGNOSIS — Z79899 Other long term (current) drug therapy: Secondary | ICD-10-CM | POA: Insufficient documentation

## 2017-09-30 DIAGNOSIS — Z886 Allergy status to analgesic agent status: Secondary | ICD-10-CM | POA: Diagnosis not present

## 2017-09-30 DIAGNOSIS — K7469 Other cirrhosis of liver: Secondary | ICD-10-CM | POA: Diagnosis not present

## 2017-09-30 DIAGNOSIS — M542 Cervicalgia: Secondary | ICD-10-CM | POA: Diagnosis not present

## 2017-09-30 DIAGNOSIS — E1040 Type 1 diabetes mellitus with diabetic neuropathy, unspecified: Secondary | ICD-10-CM | POA: Diagnosis not present

## 2017-09-30 DIAGNOSIS — N184 Chronic kidney disease, stage 4 (severe): Secondary | ICD-10-CM | POA: Diagnosis not present

## 2017-09-30 DIAGNOSIS — M79604 Pain in right leg: Secondary | ICD-10-CM | POA: Diagnosis not present

## 2017-09-30 DIAGNOSIS — E1042 Type 1 diabetes mellitus with diabetic polyneuropathy: Secondary | ICD-10-CM | POA: Diagnosis not present

## 2017-09-30 DIAGNOSIS — M5441 Lumbago with sciatica, right side: Secondary | ICD-10-CM

## 2017-09-30 DIAGNOSIS — Z833 Family history of diabetes mellitus: Secondary | ICD-10-CM | POA: Insufficient documentation

## 2017-09-30 DIAGNOSIS — E1022 Type 1 diabetes mellitus with diabetic chronic kidney disease: Secondary | ICD-10-CM | POA: Diagnosis not present

## 2017-09-30 DIAGNOSIS — M5416 Radiculopathy, lumbar region: Secondary | ICD-10-CM | POA: Diagnosis not present

## 2017-09-30 DIAGNOSIS — D649 Anemia, unspecified: Secondary | ICD-10-CM

## 2017-09-30 DIAGNOSIS — E8809 Other disorders of plasma-protein metabolism, not elsewhere classified: Secondary | ICD-10-CM

## 2017-09-30 DIAGNOSIS — E1036 Type 1 diabetes mellitus with diabetic cataract: Secondary | ICD-10-CM | POA: Diagnosis not present

## 2017-09-30 DIAGNOSIS — M25551 Pain in right hip: Secondary | ICD-10-CM | POA: Diagnosis not present

## 2017-09-30 DIAGNOSIS — E1142 Type 2 diabetes mellitus with diabetic polyneuropathy: Secondary | ICD-10-CM | POA: Diagnosis not present

## 2017-09-30 DIAGNOSIS — G629 Polyneuropathy, unspecified: Secondary | ICD-10-CM | POA: Diagnosis not present

## 2017-09-30 DIAGNOSIS — E1010 Type 1 diabetes mellitus with ketoacidosis without coma: Secondary | ICD-10-CM | POA: Insufficient documentation

## 2017-09-30 DIAGNOSIS — M5442 Lumbago with sciatica, left side: Secondary | ICD-10-CM

## 2017-09-30 DIAGNOSIS — E1122 Type 2 diabetes mellitus with diabetic chronic kidney disease: Secondary | ICD-10-CM | POA: Insufficient documentation

## 2017-09-30 DIAGNOSIS — M47816 Spondylosis without myelopathy or radiculopathy, lumbar region: Secondary | ICD-10-CM | POA: Diagnosis not present

## 2017-09-30 DIAGNOSIS — E871 Hypo-osmolality and hyponatremia: Secondary | ICD-10-CM | POA: Diagnosis not present

## 2017-09-30 DIAGNOSIS — I129 Hypertensive chronic kidney disease with stage 1 through stage 4 chronic kidney disease, or unspecified chronic kidney disease: Secondary | ICD-10-CM | POA: Diagnosis not present

## 2017-09-30 DIAGNOSIS — D631 Anemia in chronic kidney disease: Secondary | ICD-10-CM | POA: Diagnosis not present

## 2017-09-30 DIAGNOSIS — M545 Low back pain: Secondary | ICD-10-CM | POA: Diagnosis present

## 2017-09-30 DIAGNOSIS — G8929 Other chronic pain: Secondary | ICD-10-CM | POA: Insufficient documentation

## 2017-09-30 DIAGNOSIS — K219 Gastro-esophageal reflux disease without esophagitis: Secondary | ICD-10-CM | POA: Insufficient documentation

## 2017-09-30 DIAGNOSIS — M79605 Pain in left leg: Secondary | ICD-10-CM

## 2017-09-30 DIAGNOSIS — M25552 Pain in left hip: Secondary | ICD-10-CM

## 2017-09-30 DIAGNOSIS — M533 Sacrococcygeal disorders, not elsewhere classified: Secondary | ICD-10-CM

## 2017-09-30 MED ORDER — GABAPENTIN 100 MG PO CAPS: 100.0000 mg | ORAL_CAPSULE | Freq: Every day | ORAL | 1 refills | Status: DC

## 2017-09-30 NOTE — Patient Instructions (Signed)
____________________________________________________________________________________________  Initial Gabapentin Titration  Medication used: Gabapentin (Generic Name) or Neurontin (Brand Name) 100 mg tablets/capsules  Reasons to stop increasing the dose:  Reason 1: You get good relief of symptoms, in which case there is no need to increase the daily dose any further.    Reason 2: You develop some side effects, such as sleeping all of the time, difficulty concentrating, or becoming disoriented, in which case you need to go down on the dose, to the prior level, where you were not experiencing any side effects. Stay on that dose longer, to allow more time for your body to get use it, before attempting to increase it again.   Steps: Step 1: Start by taking 1 (one) tablet at bedtime x 7 (seven) days.  Step 2: After being on 1 (one) tablet for 7 (seven) days, then increase it to 2 (two) tablets at bedtime for another 7 (seven) days.  Step 3: Next, after being on 2 (two) tablets at bedtime for 7 (seven) days, then increase it to 3 (three) tablets at bedtime, and stay on that dose until you see your doctor.  Reasons to stop increasing the dose: Reason 1: You get good relief of symptoms, in which case there is no need to increase the daily dose any further.  Reason 2: You develop some side effects, such as sleeping all of the time, difficulty concentrating, or becoming disoriented, in which case you need to go down on the dose, to the prior level, where you were not experiencing any side effects. Stay on that dose longer, to allow more time for your body to get use it, before attempting to increase it again.  Endpoint: Once you have reached the maximum dose you can tolerate without side-effects, contact your physician so as to evaluate the results of the regimen.   Questions: Feel free to contact us for any questions or problems at [phone] ____________________________________________________________________________________________ ____________________________________________________________________________________________  Preparing for Procedure with Sedation  Instructions: . Oral Intake: Do not eat or drink anything for at least 8 hours prior to your procedure. . Transportation: Public transportation is not allowed. Bring an adult driver. The driver must be physically present in our waiting room before any procedure can be started. Marland Kitchen Physical Assistance: Bring an adult physically capable of assisting you, in the event you need help. This adult should keep you company at home for at least 6 hours after the procedure. . Blood Pressure Medicine: Take your blood pressure medicine with a sip of water the morning of the procedure. . Blood thinners:  . Diabetics on insulin: Notify the staff so that you can be scheduled 1st case in the morning. If your diabetes requires high dose insulin, take only  of your normal insulin dose the morning of the procedure and notify the staff that you have done so. . Preventing infections: Shower with an antibacterial soap the morning of your procedure. . Build-up your immune system: Take 1000 mg of Vitamin C with every meal (3 times a day) the day prior to your procedure. Marland Kitchen Antibiotics: Inform the staff if you have a condition or reason that requires you to take antibiotics before dental procedures. . Pregnancy: If you are pregnant, call and cancel the procedure. . Sickness: If you have a cold, fever, or any active infections, call and cancel the procedure. . Arrival: You must be in the facility at least 30 minutes prior to your scheduled procedure. . Children: Do not bring children with you. Marland Kitchen  Dress appropriately: Bring dark clothing that you would not mind if they get stained. . Valuables: Do not bring any jewelry or valuables.  Procedure appointments are reserved for interventional  treatments only. Marland Kitchen No Prescription Refills. . No medication changes will be discussed during procedure appointments. . No disability issues will be discussed.  Remember:  Regular Business hours are:  Monday to Thursday 8:00 AM to 4:00 PM  Provider's Schedule: Milinda Pointer, MD:  Procedure days: Tuesday and Thursday 7:30 AM to 4:00 PM  Gillis Santa, MD:  Procedure days: Monday and Wednesday 7:30 AM to 4:00 PM ____________________________________________________________________________________________   ____________________________________________________________________________________________  Medication Rules  Applies to: All patients receiving prescriptions (written or electronic).  Pharmacy of record: Pharmacy where electronic prescriptions will be sent. If written prescriptions are taken to a different pharmacy, please inform the nursing staff. The pharmacy listed in the electronic medical record should be the one where you would like electronic prescriptions to be sent.  Prescription refills: Only during scheduled appointments. Applies to both, written and electronic prescriptions.  NOTE: The following applies primarily to controlled substances (Opioid* Pain Medications).   Patient's responsibilities: 1. Pain Pills: Bring all pain pills to every appointment (except for procedure appointments). 2. Pill Bottles: Bring pills in original pharmacy bottle. Always bring newest bottle. Bring bottle, even if empty. 3. Medication refills: You are responsible for knowing and keeping track of what medications you need refilled. The day before your appointment, write a list of all prescriptions that need to be refilled. Bring that list to your appointment and give it to the admitting nurse. Prescriptions will be written only during appointments. If you forget a medication, it will not be "Called in", "Faxed", or "electronically sent". You will need to get another appointment to get these  prescribed. 4. Prescription Accuracy: You are responsible for carefully inspecting your prescriptions before leaving our office. Have the discharge nurse carefully go over each prescription with you, before taking them home. Make sure that your name is accurately spelled, that your address is correct. Check the name and dose of your medication to make sure it is accurate. Check the number of pills, and the written instructions to make sure they are clear and accurate. Make sure that you are given enough medication to last until your next medication refill appointment. 5. Taking Medication: Take medication as prescribed. Never take more pills than instructed. Never take medication more frequently than prescribed. Taking less pills or less frequently is permitted and encouraged, when it comes to controlled substances (written prescriptions).  6. Inform other Doctors: Always inform, all of your healthcare providers, of all the medications you take. 7. Pain Medication from other Providers: You are not allowed to accept any additional pain medication from any other Doctor or Healthcare provider. There are two exceptions to this rule. (see below) In the event that you require additional pain medication, you are responsible for notifying us, as stated below. 8. Medication Agreement: You are responsible for carefully reading and following our Medication Agreement. This must be signed before receiving any prescriptions from our practice. Safely store a copy of your signed Agreement. Violations to the Agreement will result in no further prescriptions. (Additional copies of our Medication Agreement are available upon request.) 9. Laws, Rules, & Regulations: All patients are expected to follow all Federal and [employer], [employer], Rules, [employer]. Ignorance of the Laws does not constitute a valid excuse. The use of any illegal substances is prohibited. 10. Adopted CDC guidelines & recommendations:  Target dosing  levels will be at or below 60 MME/day. Use of benzodiazepines** is not recommended.  Exceptions: There are only two exceptions to the rule of not receiving pain medications from other Healthcare Providers. 1. Exception #1 (Emergencies): In the event of an emergency (i.e.: accident requiring emergency care), you are allowed to receive additional pain medication. However, you are responsible for: As soon as you are able, call our office [phone], at any time of the day or night, and leave a message stating your name, the date and nature of the emergency, and the name and dose of the medication prescribed. In the event that your call is answered by a member of our staff, make sure to document and save the date, time, and the name of the person that took your information.  2. Exception #2 (Planned Surgery): In the event that you are scheduled by another doctor or dentist to have any type of surgery or procedure, you are allowed (for a period no longer than 30 days), to receive additional pain medication, for the acute post-op pain. However, in this case, you are responsible for picking up a copy of our "Post-op Pain Management for Surgeons" handout, and giving it to your surgeon or dentist. This document is available at our office, and does not require an appointment to obtain it. Simply go to our office during business hours (Monday-Thursday from 8:00 AM to 4:00 PM) (Friday 8:00 AM to 12:00 Noon) or if you have a scheduled appointment with [place], prior to your surgery, and ask for it by name. In addition, you will need to provide [place] with your name, name of your surgeon, type of surgery, and date of procedure or surgery.  *Opioid medications include: morphine, codeine, oxycodone, oxymorphone, hydrocodone, hydromorphone, meperidine, tramadol, tapentadol, buprenorphine, fentanyl, methadone. **Benzodiazepine medications include: diazepam (Valium), alprazolam (Xanax), clonazepam (Klonopine), lorazepam (Ativan),  clorazepate (Tranxene), chlordiazepoxide (Librium), estazolam (Prosom), oxazepam (Serax), temazepam (Restoril), triazolam (Halcion) (Last updated: 06/18/2017) ____________________________________________________________________________________________   ____________________________________________________________________________________________  Medication Recommendations and Reminders  Applies to: All patients receiving prescriptions (written and/or electronic).  Medication Rules & Regulations: These rules and regulations exist for your safety and that of others. They are not flexible and neither are we. Dismissing or ignoring them will be considered "non-compliance" with medication therapy, resulting in complete and irreversible termination of such therapy. (See document titled "Medication Rules" for more details.) In all conscience, because of safety reasons, we cannot continue providing a therapy where the patient does not follow instructions.  Pharmacy of record:   Definition: This is the pharmacy where your electronic prescriptions will be sent.   We do not endorse any particular pharmacy.  You are not restricted in your choice of pharmacy.  The pharmacy listed in the electronic medical record should be the one where you want electronic prescriptions to be sent.  If you choose to change pharmacy, simply notify our nursing staff of your choice of new pharmacy.  Recommendations:  Keep all of your pain medications in a safe place, under lock and key, even if you live alone.   After you fill your prescription, take 1 week's worth of pills and put them away in a safe place. You should keep a separate, properly labeled bottle for this purpose. The remainder should be kept in the original bottle. Use this as your primary supply, until it runs out. Once it's gone, then you know that you have 1 week's worth of medicine, and it is time to come in for  a prescription refill. If you do this  correctly, it is unlikely that you will ever run out of medicine.  To make sure that the above recommendation works, it is very important that you make sure your medication refill appointments are scheduled at least 1 week before you run out of medicine. To do this in an effective manner, make sure that you do not leave the office without scheduling your next medication management appointment. Always ask the nursing staff to show you in your prescription , when your medication will be running out. Then arrange for the receptionist to get you a return appointment, at least 7 days before you run out of medicine. Do not wait until you have 1 or 2 pills left, to come in. This is very poor planning and does not take into consideration that we may need to cancel appointments due to bad weather, sickness, or emergencies affecting our staff.  Prescription refills and/or changes in medication(s):   Prescription refills, and/or changes in dose or medication, will be conducted only during scheduled medication management appointments. (Applies to both, written and electronic prescriptions.)  No refills on procedure days. No medication will be changed or started on procedure days. No changes, adjustments, and/or refills will be conducted on a procedure day. Doing so will interfere with the diagnostic portion of the procedure.  No phone refills. No medications will be "called into the pharmacy".  No Fax refills.  No weekend refills.  No Holliday refills.  No after hours refills.  Remember:  Business hours are:  Monday to Thursday 8:00 AM to 4:00 PM Provider's Schedule: Dionisio David, NP - Appointments are:  Medication management: Monday to Thursday 8:00 AM to 4:00 PM Milinda Pointer, MD - Appointments are:  Medication management: Monday and Wednesday 8:00 AM to 4:00 PM Procedure day: Tuesday and Thursday 7:30 AM to 4:00 PM Gillis Santa, MD - Appointments are:  Medication management: Tuesday and  Thursday 8:00 AM to 4:00 PM Procedure day: Monday and Wednesday 7:30 AM to 4:00 PM (Last update: 06/18/2017) ____________________________________________________________________________________________   ____________________________________________________________________________________________  CANNABIDIOL (AKA: CBD Oil or Pills)  Applies to: All patients receiving prescriptions of controlled substances (written and/or electronic).  General Information: Cannabidiol (CBD) was discovered in 16. It is one of some 113 identified cannabinoids in cannabis (Marijuana) plants, accounting for up to 40% of the plant's extract. As of 2018, preliminary clinical research on cannabidiol included studies of anxiety, cognition, movement disorders, and pain.  Cannabidiol is consummed in multiple ways, including inhalation of cannabis smoke or vapor, as an aerosol spray into the cheek, and by mouth. It may be supplied as CBD oil containing CBD as the active ingredient (no added tetrahydrocannabinol (THC) or terpenes), a full-plant CBD-dominant hemp extract oil, capsules, dried cannabis, or as a liquid solution. CBD is thought not have the same psychoactivity as THC, and may affect the actions of THC. Studies suggest that CBD may interact with different biological targets, including cannabinoid receptors and other neurotransmitter receptors. As of 2018 the mechanism of action for its biological effects has not been determined.  In the [place], cannabidiol has a limited approval by the Food and Drug Administration (FDA) for treatment of only two types of epilepsy disorders. The side effects of long-term use of the drug include somnolence, decreased appetite, diarrhea, fatigue, malaise, weakness, sleeping problems, and others.  CBD remains a Schedule I drug prohibited for any use.  Legality: Some manufacturers ship CBD products nationally, an illegal action which the FDA  has not enforced in 2018, with CBD  remaining the subject of an FDA investigational new drug evaluation, and is not considered legal as a dietary supplement or food ingredient as of December 2018. Federal illegality has made it difficult historically to conduct research on CBD. CBD is openly sold in head shops and health food stores in some states where such sales have not been explicitly legalized.  Warning: Because it is not FDA approved for general use or treatment of pain, it is not required to undergo the same manufacturing controls as prescription drugs.  This means that the available cannabidiol (CBD) may be contaminated with THC.  If this is the case, it will trigger a positive urine drug screen (UDS) test for cannabinoids (Marijuana).  Because a positive UDS for illicit substances is a violation of our medication agreement, your opioid analgesics (pain medicine) may be permanently discontinued. (Last update: 07/09/2017) ____________________________________________________________________________________________

## 2017-10-08 ENCOUNTER — Telehealth: Payer: Self-pay | Admitting: Pain Medicine

## 2017-10-08 NOTE — Telephone Encounter (Signed)
Wants to cancel procedure appointment, states cannot fast due to blood sugar tending to drop. Also wants to discuss possibility of getting medications. Advised medication issues will not be discussed over the phone. Will schedule eval appt with Corky Downs NP.

## 2017-10-08 NOTE — Telephone Encounter (Signed)
Patient wants to speak with nurse per vmail from patient at 3:03

## 2017-10-21 ENCOUNTER — Other Ambulatory Visit: Payer: Self-pay

## 2017-10-21 ENCOUNTER — Encounter: Payer: Self-pay | Admitting: Nurse Practitioner

## 2017-10-21 ENCOUNTER — Ambulatory Visit: Payer: BLUE CROSS/BLUE SHIELD | Attending: Nurse Practitioner | Admitting: Nurse Practitioner

## 2017-10-21 VITALS — BP 151/76 | HR 94 | Temp 97.7°F | Ht 70.0 in | Wt 165.0 lb

## 2017-10-21 DIAGNOSIS — K219 Gastro-esophageal reflux disease without esophagitis: Secondary | ICD-10-CM | POA: Diagnosis not present

## 2017-10-21 DIAGNOSIS — Z79891 Long term (current) use of opiate analgesic: Secondary | ICD-10-CM | POA: Diagnosis not present

## 2017-10-21 DIAGNOSIS — D649 Anemia, unspecified: Secondary | ICD-10-CM | POA: Diagnosis not present

## 2017-10-21 DIAGNOSIS — Z794 Long term (current) use of insulin: Secondary | ICD-10-CM | POA: Diagnosis not present

## 2017-10-21 DIAGNOSIS — G894 Chronic pain syndrome: Secondary | ICD-10-CM

## 2017-10-21 DIAGNOSIS — I4892 Unspecified atrial flutter: Secondary | ICD-10-CM | POA: Insufficient documentation

## 2017-10-21 DIAGNOSIS — M79605 Pain in left leg: Secondary | ICD-10-CM

## 2017-10-21 DIAGNOSIS — I129 Hypertensive chronic kidney disease with stage 1 through stage 4 chronic kidney disease, or unspecified chronic kidney disease: Secondary | ICD-10-CM | POA: Insufficient documentation

## 2017-10-21 DIAGNOSIS — K7460 Unspecified cirrhosis of liver: Secondary | ICD-10-CM | POA: Diagnosis not present

## 2017-10-21 DIAGNOSIS — N184 Chronic kidney disease, stage 4 (severe): Secondary | ICD-10-CM | POA: Insufficient documentation

## 2017-10-21 DIAGNOSIS — E1142 Type 2 diabetes mellitus with diabetic polyneuropathy: Secondary | ICD-10-CM | POA: Diagnosis not present

## 2017-10-21 DIAGNOSIS — Z79899 Other long term (current) drug therapy: Secondary | ICD-10-CM | POA: Insufficient documentation

## 2017-10-21 DIAGNOSIS — M533 Sacrococcygeal disorders, not elsewhere classified: Secondary | ICD-10-CM | POA: Insufficient documentation

## 2017-10-21 DIAGNOSIS — G8929 Other chronic pain: Secondary | ICD-10-CM

## 2017-10-21 DIAGNOSIS — M79604 Pain in right leg: Secondary | ICD-10-CM | POA: Diagnosis not present

## 2017-10-21 DIAGNOSIS — K449 Diaphragmatic hernia without obstruction or gangrene: Secondary | ICD-10-CM | POA: Diagnosis not present

## 2017-10-21 DIAGNOSIS — E1042 Type 1 diabetes mellitus with diabetic polyneuropathy: Secondary | ICD-10-CM | POA: Diagnosis not present

## 2017-10-21 DIAGNOSIS — M5416 Radiculopathy, lumbar region: Secondary | ICD-10-CM

## 2017-10-21 DIAGNOSIS — R748 Abnormal levels of other serum enzymes: Secondary | ICD-10-CM | POA: Diagnosis not present

## 2017-10-21 DIAGNOSIS — M545 Low back pain: Secondary | ICD-10-CM | POA: Diagnosis present

## 2017-10-21 DIAGNOSIS — E1022 Type 1 diabetes mellitus with diabetic chronic kidney disease: Secondary | ICD-10-CM | POA: Insufficient documentation

## 2017-10-21 NOTE — Progress Notes (Signed)
Patient's Name: Laurie Lin  MRN: [id number]  Referring Provider: Mikey College, *  DOB: July 05, 1955  PCP: Mikey College, NP  DOS: 10/21/2017  Note by: Vevelyn Francois NP  Service setting: Ambulatory outpatient  Specialty: Interventional Pain Management  Location: [facility] (AMB) Pain Management Facility    Patient type: Established    Primary Reason(s) for Visit: Evaluation of chronic illnesses with exacerbation, or progression (Level of risk: moderate) CC: Back Pain (lower)  HPI  Laurie Lin is a 62 y.o. year old, female patient, who comes today for a follow-up evaluation. She has DKA (diabetic ketoacidoses) ([place]); Type 1 diabetes mellitus with hyperglycemia ([place]); Atrial flutter ([place]); Neuropathy; Essential hypertension; Hepatic steatosis; Hiatal hernia; Elevated liver enzymes; Cataracts, bilateral; Albuminuria; Other cirrhosis of liver ([place]); Chronic lumbar radiculopathy; Chronic neck pain; Reactive depression; H/O diabetic foot ulcer; Chronic low back pain (Primary Area of Pain) (Bilateral) (R>L) w/ sciatica (Bilateral); Chronic lower extremity pain (Secondary Area of Pain) (Bilateral) (R>L); Chronic pain syndrome; Long term current use of opiate analgesic; Pharmacologic therapy; Disorder of skeletal system; Problems influencing health status; Neurogenic pain; Anemia; Hyponatremia; Hypocalcemia; Hypoalbuminemia; Diabetes mellitus with stage 4 chronic kidney disease GFR 15-29 ([place]); Diabetic peripheral neuropathy ([place]); Chronic pain of hip (B); Chronic sacroiliac joint pain (B); and Lumbar facet joint syndrome on their problem list. Laurie Lin was last seen on 09/09/2017. Her primarily concern today is the Back Pain (lower)  Pain Assessment: Location: Lower Back Radiating: Radiates down both leg Onset: More than a month ago Duration: Chronic pain Quality: Aching, Constant Severity: 4 /10 (subjective, self-reported pain score)  Note: Reported level is compatible with observation.                           Effect on ADL: limits daily activities and unable to sleep at night Timing: Constant Modifying factors: Tylenol BP: (!) 151/76  HR: 94  Further details on both, my assessment(s), as well as the proposed treatment plan, please see below.  Laurie Lin is in today with her husband for an evaluation.  She is a new patient here and was seen by Dr. Dossie Arbour on 09/30/2017.  She was started on gabapentin there were some additional images ordered along with blood work.  She has not had any of this completed and has not started the gabapentin.  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  She does not want to try this.  She denies trying Lyrica in the past.  She is wanting to have hydrocodone because this works for her pain.  She admits that she is not interested in any type of interventional therapy.  She admits that she is not sure if she will proceed with the ordered imaging.  She admits that she is currently using Tylenol for her pain.   Laboratory Chemistry  Inflammation Markers (CRP: Acute Phase) (ESR: Chronic Phase) Lab Results  Component Value Date   CRP 1.1 09/09/2017   ESRSEDRATE 20 09/09/2017   LATICACIDVEN 1.6 03/21/2017                         Rheumatology Markers No results found for: RF, ANA, LABURIC, URICUR, LYMEIGGIGMAB, LYMEABIGMQN, HLAB27                      Renal Function Markers Lab Results  Component Value Date   BUN  39 (A) 08/27/2017   CREATININE 1.6 (A) 08/27/2017   GFRAA 18 (L) 03/27/2017   GFRNONAA 15 (L) 03/27/2017                             Hepatic Function Markers Lab Results  Component Value Date   AST 13 (L) 03/21/2017   ALT 8 (L) 03/21/2017   ALBUMIN 3.1 (L) 03/21/2017   ALKPHOS 55 03/21/2017   AMMONIA 20 03/22/2017                        Electrolytes Lab Results  Component Value Date   NA 5 (A) 08/27/2017   K 3.6 03/27/2017   CL 107 03/27/2017   CALCIUM 7.5 (L) 03/27/2017    MG 1.6 09/09/2017   PHOS 3.3 03/24/2017                        Neuropathy Markers Lab Results  Component Value Date   VITAMINB12 307 09/09/2017   HGBA1C 12.3 (H) 03/23/2017                        Bone Pathology Markers Lab Results  Component Value Date   25OHVITD1 31 09/09/2017   25OHVITD2 <1.0 09/09/2017   25OHVITD3 31 09/09/2017                         Coagulation Parameters Lab Results  Component Value Date   INR 0.98 03/23/2017   LABPROT 12.9 03/23/2017   APTT 28 03/23/2017   PLT 197 03/25/2017                        Cardiovascular Markers Lab Results  Component Value Date   TROPONINI <0.03 03/21/2017   HGB 7.6 (L) 03/25/2017   HCT 23.3 (L) 03/25/2017                         CA Markers No results found for: CEA, CA125, LABCA2                      Note: Lab results reviewed.  Recent Diagnostic Imaging Review  Complexity Note: Imaging results reviewed. Results shared with Laurie Lin, using Layman's terms.                         Meds   Current Outpatient Medications:  .  acetaminophen (TYLENOL) 500 MG tablet, Take 1,000 mg by mouth 2 (two) times daily as needed., Disp: , Rfl:  .  cyclobenzaprine (FLEXERIL) 10 MG tablet, Take 10 mg by mouth 3 (three) times daily as needed for muscle spasms., Disp: , Rfl:  .  esomeprazole (NEXIUM) 40 MG capsule, Take 40 mg by mouth 2 (two) times daily., Disp: , Rfl: 1 .  furosemide (LASIX) 20 MG tablet, Take 20 mg by mouth daily., Disp: , Rfl:  .  insulin aspart (NOVOLOG) 100 UNIT/ML injection, Inject 0-9 Units into the skin 3 (three) times daily with meals. BS 70-120 - 0 units       121-150 - 1 units       151-200 - 2 units        201-250 - 3 units        251-300 - 4 units  301-350 - 5 units        351-400 - 6 units        > 400 call md., Disp: 10 mL, Rfl: 11 .  insulin glargine (LANTUS) 100 UNIT/ML injection, Inject 0.07 mLs (7 Units total) into the skin at bedtime. (Patient taking differently: Inject 14 Units into the  skin at bedtime. ), Disp: 10 mL, Rfl: 11 .  lisinopril (PRINIVIL,ZESTRIL) 2.5 MG tablet, Take 1 tablet (2.5 mg total) by mouth daily., Disp: 30 tablet, Rfl: 5 .  Multiple Vitamin (MULTIVITAMIN WITH MINERALS) TABS tablet, Take 1 tablet by mouth daily., Disp: , Rfl:  .  ONETOUCH DELICA LANCETS 56P MISC, 1 Device by Does not apply route 4 (four) times daily., Disp: 100 each, Rfl: 11  ROS  Constitutional: Denies any fever or chills Gastrointestinal: No reported hemesis, hematochezia, vomiting, or acute GI distress Musculoskeletal: Denies any acute onset joint swelling, redness, loss of ROM, or weakness Neurological: No reported episodes of acute onset apraxia, aphasia, dysarthria, agnosia, amnesia, paralysis, loss of coordination, or loss of consciousness  Allergies  Laurie Lin is allergic to ibuprofen and latex.  [place]  Drug: Laurie Lin  reports that she does not use drugs. Alcohol:  reports that she drinks alcohol. Tobacco:  reports that she has never smoked. She has never used smokeless tobacco. Medical:  has a past medical history of Diabetes mellitus without complication ([place]), GERD (gastroesophageal reflux disease), Hypertension, and Renal disorder. Surgical: Laurie Lin  has a past surgical history that includes Eye surgery and Knee arthroscopy (Left, 1989). Family: family history includes Diabetes in her mother.  Constitutional Exam  General appearance: Well nourished, well developed, and well hydrated. In no apparent acute distress Vitals:   10/21/17 0839 10/21/17 0840  BP:  (!) 151/76  Pulse:  94  Temp:  97.7 F (36.5 C)  SpO2:  100%  Weight: 165 lb (74.8 kg)   Height: _0  (1.778 m)   Psych/Mental status: Alert, oriented x 3 (person, place, & time)       Eyes: PERLA Respiratory: No evidence of acute respiratory distress         Gait & Posture Assessment  Ambulation: Unassisted Gait: Relatively normal for age and body habitus Posture: WNL   Lower Extremity Exam     Side: Right lower extremity  Side: Left lower extremity  Stability: No instability observed          Stability: No instability observed          Skin & Extremity Inspection: Skin color, temperature, and hair growth are WNL. No peripheral edema or cyanosis. No masses, redness, swelling, asymmetry, or associated skin lesions. No contractures.  Skin & Extremity Inspection: Skin color, temperature, and hair growth are WNL. No peripheral edema or cyanosis. No masses, redness, swelling, asymmetry, or associated skin lesions. No contractures.  Functional ROM: Unrestricted ROM                  Functional ROM: Unrestricted ROM                  Muscle Tone/Strength: Functionally intact. No obvious neuro-muscular anomalies detected.  Muscle Tone/Strength: Functionally intact. No obvious neuro-muscular anomalies detected.  Sensory (Neurological): Unimpaired  Sensory (Neurological): Unimpaired  Palpation: No palpable anomalies  Palpation: No palpable anomalies   Assessment  Primary Diagnosis & Pertinent Problem List: The primary encounter diagnosis was Chronic lower extremity pain (Secondary Area of Pain) (Bilateral) (R>L). Diagnoses of Diabetic peripheral neuropathy ([place]), Chronic  pain syndrome, and Chronic lumbar radiculopathy were also pertinent to this visit.  Status Diagnosis  Persistent Persistent Persistent 1. Chronic lower extremity pain (Secondary Area of Pain) (Bilateral) (R>L)   2. Diabetic peripheral neuropathy ([place])   3. Chronic pain syndrome   4. Chronic lumbar radiculopathy     Problems updated and reviewed during this visit: Problem  Chronic Lumbar Radiculopathy   Plan of Care  Pharmacotherapy (Medications Ordered): No orders of the defined types were placed in this encounter.  New Prescriptions   No medications on file   Medications administered today: Bonnetta Barry had no medications administered during this visit. Lab-work, procedure(s), and/or referral(s): No orders of  the defined types were placed in this encounter.  Imaging and/or referral(s): None  Interventional management options: Planned, scheduled, and/or pending:    She was in formed that she can go the the ER if she feels like her pain is getting worse and she can not manage it.  She was instructed that I will speak with Dr. Dossie Arbour on Monday as relates to her treatment regimen.   Considering:   NOTE: Avoid Steroids due to DM & Hx of DKA  Diagnosticbilateral lumbar facet nerve block no steroid  Possible bilateral lumbar facet RFA    PRN Procedures:   None at this time   Provider-requested follow-up: Return for xrays.  No future appointments. Primary Care Physician: Mikey College, NP Location: [facility] Note by: Vevelyn Francois NP Date: 10/21/2017; Time: 3:45 PM  Pain Score Disclaimer: We use the NRS-11 scale. This is a self-reported, subjective measurement of pain severity with only modest accuracy. It is used primarily to identify changes within a particular patient. It must be understood that outpatient pain scales are significantly less accurate that those used for research, where they can be applied under ideal controlled circumstances with minimal exposure to variables. In reality, the score is likely to be a combination of pain intensity and pain affect, where pain affect describes the degree of emotional arousal or changes in action readiness caused by the sensory experience of pain. Factors such as social and work situation, setting, emotional state, anxiety levels, expectation, and prior pain experience may influence pain perception and show large inter-individual differences that may also be affected by time variables.  Patient instructions provided during this appointment: Patient Instructions  Instructed to have her images and we will get back with her on Monday on what Dr Lowella Dandy suggested for her treatment.

## 2017-10-21 NOTE — Patient Instructions (Addendum)
Instructed to have her images and she will here back on Monday on what Dr Lowella Dandy suggested for further.

## 2017-10-26 ENCOUNTER — Telehealth: Payer: Self-pay

## 2017-10-26 NOTE — Telephone Encounter (Signed)
-----   Message from Vevelyn Francois, NP sent at 10/21/2017  4:33 PM EDT ----- Please make her aware that Dr. Dossie Arbour will not be in the office until Wednesday.  It is at that time that I will speak with him as it relates to her treatment regimen and then we will give her a call thank you.

## 2017-10-26 NOTE — Telephone Encounter (Signed)
Called patient and made aware that Crystal will talk to Dr Dossie Arbour on Wednesday when he returns. Patient aware .

## 2017-11-03 ENCOUNTER — Telehealth: Payer: Self-pay

## 2017-11-03 NOTE — Telephone Encounter (Signed)
Pt called and wants to know if MRI is ordered with or Without contrast? Pt concerned about contrast because of her kidney issues, could you please inform pt

## 2017-11-03 NOTE — Telephone Encounter (Signed)
without

## 2017-11-04 NOTE — Telephone Encounter (Signed)
Laurie Ng- please call patient and notify NO CONTRAST will be used.- thank you

## 2017-11-05 ENCOUNTER — Ambulatory Visit
Admission: RE | Admit: 2017-11-05 | Discharge: 2017-11-05 | Disposition: A | Discharge status: Home / Self Care | Payer: BLUE CROSS/BLUE SHIELD | Source: Ambulatory Visit | Attending: Pain Medicine | Admitting: Pain Medicine

## 2017-11-05 DIAGNOSIS — M533 Sacrococcygeal disorders, not elsewhere classified: Secondary | ICD-10-CM

## 2017-11-05 DIAGNOSIS — M47816 Spondylosis without myelopathy or radiculopathy, lumbar region: Secondary | ICD-10-CM

## 2017-11-05 DIAGNOSIS — G8929 Other chronic pain: Secondary | ICD-10-CM | POA: Insufficient documentation

## 2017-11-05 DIAGNOSIS — M25551 Pain in right hip: Secondary | ICD-10-CM

## 2017-11-05 DIAGNOSIS — M5441 Lumbago with sciatica, right side: Principal | ICD-10-CM

## 2017-11-05 DIAGNOSIS — M5416 Radiculopathy, lumbar region: Secondary | ICD-10-CM

## 2017-11-05 DIAGNOSIS — M5136 Other intervertebral disc degeneration, lumbar region: Secondary | ICD-10-CM | POA: Insufficient documentation

## 2017-11-05 DIAGNOSIS — M25552 Pain in left hip: Secondary | ICD-10-CM

## 2017-11-05 DIAGNOSIS — M5442 Lumbago with sciatica, left side: Principal | ICD-10-CM

## 2017-11-05 DIAGNOSIS — M79605 Pain in left leg: Secondary | ICD-10-CM

## 2017-11-05 DIAGNOSIS — M79604 Pain in right leg: Secondary | ICD-10-CM

## 2017-11-10 ENCOUNTER — Other Ambulatory Visit: Payer: Self-pay

## 2017-11-18 ENCOUNTER — Telehealth: Payer: Self-pay | Admitting: *Deleted

## 2017-11-18 NOTE — Telephone Encounter (Signed)
Patient called back re; message left earlier.  Gave impressions of xrays, as she could already see through myChart.  I told her that this was as far as I could in terms of interpretation that she would have to have an appt with a provider in order to discuss results and then form a plan.  With further research, she has been ordered for a bilateral lumbar facet which has been approved.  Patient states that she can not undergo procedures because she is a brittle diabetic and her sugars will drop too much to undergo such.  She also states that she knows what works and it is the medicine that she was getting from [facility].  I did educate the patient that typically we don't manage with medications only with new patients that ususally it is in conjunction with preemptive pain management.  I did tell her that she would need to schedule an appt to discuss these details with Crystal or Dr Dossie Arbour, she has chosen to see Dr Dossie Arbour.  Transferred to secretaries for scheduling.

## 2017-11-18 NOTE — Telephone Encounter (Signed)
Voicemail left with patient that I do not see an MRI order or result for her.  That I do see xray results x 4 from November 05, 2017 which all show no acute abnormalities and that Dr Dossie Arbour would discuss in further detail at her next appt .

## 2017-11-30 ENCOUNTER — Ambulatory Visit: Payer: BLUE CROSS/BLUE SHIELD | Admitting: Pain Medicine

## 2017-12-08 NOTE — Progress Notes (Signed)
Patient's Name: Laurie Lin  MRN: [id number]  Referring Provider: Mikey Lin, *  DOB: 07-Nov-1955  PCP: Laurie College, NP  DOS: 12/09/2017  Note by: Laurie Cola, MD  Service setting: Ambulatory outpatient  Specialty: Interventional Pain Management  Location: [facility] (AMB) Pain Management Facility    Patient type: Established   Primary Reason(s) for Visit: Evaluation of chronic illnesses with exacerbation, or progression (Level of risk: moderate) CC: Back Pain (low)  HPI  Laurie Lin is a 62 y.o. year old, female patient, who comes today for a follow-up evaluation. She has DKA (diabetic ketoacidoses) (Laurie Lin); Type 1 diabetes mellitus with hyperglycemia (Laurie Lin); Atrial flutter (Laurie Lin); Neuropathy; Essential hypertension; Hepatic steatosis; Hiatal hernia; Elevated liver enzymes; Cataracts, bilateral; Albuminuria; Other cirrhosis of liver (Laurie Lin); Chronic lumbar radiculopathy; Chronic neck pain; Reactive depression; H/O diabetic foot ulcer; Chronic low back pain (Primary Area of Pain) (Bilateral) (R>L) w/ sciatica (Bilateral); Chronic lower extremity pain (Secondary Area of Pain) (Bilateral) (R>L); Chronic pain syndrome; Long term current use of opiate analgesic; Pharmacologic therapy; Disorder of skeletal system; Problems influencing health status; Neurogenic pain; Anemia; Hyponatremia; Hypocalcemia; Hypoalbuminemia; Diabetes mellitus with stage 4 chronic kidney disease GFR 15-29 (Laurie Lin); Diabetic peripheral neuropathy (Laurie Lin); Chronic pain of hip (B); Chronic sacroiliac joint pain (B); and Lumbar facet joint syndrome on their problem list. Laurie Lin was last seen on 10/08/2017. Her primarily concern today is the Back Pain (low)  Pain Assessment: Location: Lower Back Radiating: has pain in both legs down to feet, but states has neuropathy in feet Onset: More than a month ago Duration: Chronic pain Quality: ("It feels like my back is being ripped off") Severity: 3 /10 (subjective,  self-reported pain score)  Note: Reported level is compatible with observation.                         When using our objective Pain Scale, levels between 6 and 10/10 are said to belong in an emergency room, as it progressively worsens from a 6/10, described as severely limiting, requiring emergency care not usually available at an outpatient pain management facility. At a 6/10 level, communication becomes difficult and requires great effort. Assistance to reach the emergency department may be required. Facial flushing and profuse sweating along with potentially dangerous increases in heart rate and blood pressure will be evident. Effect on ADL: "I cant stand for long periods" Timing: Constant Modifying factors: Hydrocodone BP: (!) 147/70  HR: 88  The patient has requested to come in today to go over the results of the MRI, however there are no MRI results available. If she had an MRI done, it was done at an institution that does not use "Epic", since I did look for MRI results at the "care everywhere" section of Epic, but there were none available. There are also no MRIs at any component institution. As we talked to her, she apparently meant the x-rays. Today I went over the results of each one of them and I explained to him in layman's terms. Of significance is the fact that in all of them they can see osseous demineralization. The patient was instructed to talk to her primary care physician for this. None of the x-rays showed any acute bony abnormalities. The patient was offered some diagnostic lumbar facet blocks which she declined. I asked the patient if she had her nerve conduction test and she indicated that she had no intention to have it done. Based on the results available  and the lack of evidence, I do not feel comfortable prescribing her any opioid analgesic controlled substances. In addition, the patient is not interested in interventional therapies and at this point were not taking any  patients for medication management only. I suggested taking some membrane stabilizers for the diabetic peripheral neuropathy, but she declined this offer. I asked if there was anything else that I could offer her and she indicated that there was not and left the clinic without setting up any further appointments.  Further details on both, my assessment(s), as well as the proposed treatment plan, please see below.  Laboratory Chemistry  Inflammation Markers (CRP: Acute Phase) (ESR: Chronic Phase) Lab Results  Component Value Date   CRP 1.1 09/09/2017   ESRSEDRATE 20 09/09/2017   LATICACIDVEN 1.6 03/21/2017                         Rheumatology Markers No results found.  Renal Function Markers Lab Results  Component Value Date   BUN 39 (A) 08/27/2017   CREATININE 1.6 (A) 08/27/2017   GFRAA 18 (L) 03/27/2017   GFRNONAA 15 (L) 03/27/2017                             Hepatic Function Markers Lab Results  Component Value Date   AST 13 (L) 03/21/2017   ALT 8 (L) 03/21/2017   ALBUMIN 3.1 (L) 03/21/2017   ALKPHOS 55 03/21/2017   AMMONIA 20 03/22/2017                        Electrolytes Lab Results  Component Value Date   NA 5 (A) 08/27/2017   K 3.6 03/27/2017   CL 107 03/27/2017   CALCIUM 7.5 (L) 03/27/2017   MG 1.6 09/09/2017   PHOS 3.3 03/24/2017                        Neuropathy Markers Lab Results  Component Value Date   VITAMINB12 307 09/09/2017   HGBA1C 12.3 (H) 03/23/2017                        Bone Pathology Markers Lab Results  Component Value Date   25OHVITD1 31 09/09/2017   25OHVITD2 <1.0 09/09/2017   25OHVITD3 31 09/09/2017                         Coagulation Parameters Lab Results  Component Value Date   INR 0.98 03/23/2017   LABPROT 12.9 03/23/2017   APTT 28 03/23/2017   PLT 197 03/25/2017                        Cardiovascular Markers Lab Results  Component Value Date   TROPONINI <0.03 03/21/2017   HGB 7.6 (L) 03/25/2017   HCT 23.3 (L)  03/25/2017                         CA Markers No results found.  Note: Lab results reviewed.  Recent Diagnostic Imaging Review  Lumbosacral Imaging: Lumbar DG Bending views:  Results for orders placed during the hospital encounter of 11/05/17  DG Lumbar Spine Complete W/Bend   Narrative CLINICAL DATA:  Chronic low back pain and BILATERAL hip pain for years worse in past 3 years,  now painful to walk  EXAM: LUMBAR SPINE - COMPLETE WITH BENDING VIEWS  COMPARISON:  None  FINDINGS: Hypoplastic last rib pair.  Five non-rib-bearing lumbar vertebra.  Diffuse osseous demineralization.  Disc space narrowing with endplate spur formation at L2-L3.  Small superior endplate spur at L4.  Vertebral body heights maintained without fracture or subluxation.  No bone destruction or spondylolysis.  Minimal broad-based dextroconvex thoracolumbar scoliosis.  No abnormal motion with flexion or extension.  IMPRESSION: Osseous demineralization with mild degenerative disc disease changes.  No acute abnormalities.   Electronically Signed   By: Lavonia Dana M.D.   On: 11/05/2017 17:30    Sacroiliac Joint Imaging: Sacroiliac Joint DG:  Results for orders placed during the hospital encounter of 11/05/17  DG Si Joints   Narrative CLINICAL DATA:  Chronic low back pain and BILATERAL hip pain for years worse in past 3 years, now painful to walk  EXAM: BILATERAL SACROILIAC JOINTS - 3+ VIEW  COMPARISON:  None  FINDINGS: SI joints symmetric and preserved.  Bones demineralized.  Sacral foramina symmetric.  No evidence of sacroiliitis or bone destruction.  Extensive vascular calcifications.  IMPRESSION: No acute abnormalities.   Electronically Signed   By: Lavonia Dana M.D.   On: 11/05/2017 17:33    Hip Imaging: Hip-R DG 2-3 views:  Results for orders placed during the hospital encounter of 11/05/17  DG HIP UNILAT W OR W/O PELVIS 2-3 VIEWS RIGHT   Narrative CLINICAL  DATA:  Chronic low back pain and BILATERAL hip pain for years worse in past 3 years, now painful to walk  EXAM: DG HIP (WITH OR WITHOUT PELVIS) 2-3V RIGHT  COMPARISON:  None  FINDINGS: Osseous demineralization.  Hip and SI joint spaces preserved.  No acute fracture, dislocation, or bone destruction.  Extensive atherosclerotic calcifications.  IMPRESSION: No acute abnormalities.   Electronically Signed   By: Lavonia Dana M.D.   On: 11/05/2017 17:31    Hip-L DG 2-3 views:  Results for orders placed during the hospital encounter of 11/05/17  DG HIP UNILAT W OR W/O PELVIS 2-3 VIEWS LEFT   Narrative CLINICAL DATA:  Chronic low back pain and BILATERAL hip pain for years worse in past 3 years, now painful to walk  EXAM: DG HIP (WITH OR WITHOUT PELVIS) 2-3V LEFT  COMPARISON:  None  FINDINGS: Osseous demineralization.  Hip and SI joint spaces preserved.  No acute fracture, dislocation, or bone destruction.  Extensive atherosclerotic calcifications.  IMPRESSION: No acute osseous abnormalities.   Electronically Signed   By: Lavonia Dana M.D.   On: 11/05/2017 17:32    Complexity Note: Imaging results reviewed. Results shared with Ms. Holts, using Layman's terms.                         Meds   Current Outpatient Medications:  .  acetaminophen (TYLENOL) 500 MG tablet, Take 1,000 mg by mouth 2 (two) times daily as needed., Disp: , Rfl:  .  cyclobenzaprine (FLEXERIL) 10 MG tablet, Take 10 mg by mouth 3 (three) times daily as needed for muscle spasms., Disp: , Rfl:  .  esomeprazole (NEXIUM) 40 MG capsule, Take 40 mg by mouth 2 (two) times daily., Disp: , Rfl: 1 .  furosemide (LASIX) 20 MG tablet, Take 20 mg by mouth daily., Disp: , Rfl:  .  insulin aspart (NOVOLOG) 100 UNIT/ML injection, Inject 0-9 Units into the skin 3 (three) times daily with meals. BS 70-120 - 0 units  121-150 - 1 units       151-200 - 2 units        201-250 - 3 units        251-300 - 4 units         301-350 - 5 units        351-400 - 6 units        > 400 call md., Disp: 10 mL, Rfl: 11 .  insulin glargine (LANTUS) 100 UNIT/ML injection, Inject 0.07 mLs (7 Units total) into the skin at bedtime. (Patient taking differently: Inject 14 Units into the skin at bedtime. ), Disp: 10 mL, Rfl: 11 .  lisinopril (PRINIVIL,ZESTRIL) 2.5 MG tablet, Take 1 tablet (2.5 mg total) by mouth daily., Disp: 30 tablet, Rfl: 5 .  Multiple Vitamin (MULTIVITAMIN WITH MINERALS) TABS tablet, Take 1 tablet by mouth daily., Disp: , Rfl:  .  ONETOUCH DELICA LANCETS 35K MISC, 1 Device by Does not apply route 4 (four) times daily., Disp: 100 each, Rfl: 11  ROS  Constitutional: Denies any fever or chills Gastrointestinal: No reported hemesis, hematochezia, vomiting, or acute GI distress Musculoskeletal: Denies any acute onset joint swelling, redness, loss of ROM, or weakness Neurological: No reported episodes of acute onset apraxia, aphasia, dysarthria, agnosia, amnesia, paralysis, loss of coordination, or loss of consciousness  Allergies  Ms. Mangione is allergic to ibuprofen; nsaids; and latex.  [place]  Drug: Ms. Weitz  reports that she does not use drugs. Alcohol:  reports that she drinks alcohol. Tobacco:  reports that she has never smoked. She has never used smokeless tobacco. Medical:  has a past medical history of Diabetes mellitus without complication ([place]), GERD (gastroesophageal reflux disease), Hypertension, and Renal disorder. Surgical: Ms. Petitjean  has a past surgical history that includes Eye surgery and Knee arthroscopy (Left, 1989). Family: family history includes Diabetes in her mother.  Constitutional Exam  General appearance: Well nourished, well developed, and well hydrated. In no apparent acute distress Vitals:   12/09/17 0811 12/09/17 0812  BP:  (!) 147/70  Pulse:  88  Resp:  18  Temp:  97.9 F (36.6 C)  SpO2:  100%  Weight: 169 lb (76.7 kg)   Height: 5' 10"  (1.778 m)    BMI  Assessment: Estimated body mass index is 24.25 kg/m as calculated from the following:   Height as of this encounter: 5' 10"  (1.778 m).   Weight as of this encounter: 169 lb (76.7 kg).  BMI interpretation table: BMI level Category Range association with higher incidence of chronic pain  <18 kg/m2 Underweight   18.5-24.9 kg/m2 Ideal body weight   25-29.9 kg/m2 Overweight Increased incidence by 20%  30-34.9 kg/m2 Obese (Class I) Increased incidence by 68%  35-39.9 kg/m2 Severe obesity (Class II) Increased incidence by 136%  >40 kg/m2 Extreme obesity (Class III) Increased incidence by 254%   Patient's current BMI Ideal Body weight  Body mass index is 24.25 kg/m. Ideal body weight: 68.5 kg (151 lb 0.2 oz) Adjusted ideal body weight: 71.8 kg (158 lb 3.3 oz)   BMI Readings from Last 4 Encounters:  12/09/17 24.25 kg/m  10/21/17 23.68 kg/m  09/30/17 23.53 kg/m  09/09/17 23.20 kg/m   Wt Readings from Last 4 Encounters:  12/09/17 169 lb (76.7 kg)  10/21/17 165 lb (74.8 kg)  09/30/17 164 lb (74.4 kg)  09/09/17 164 lb (74.4 kg)  Psych/Mental status: Alert, oriented x 3 (person, place, & time)       Eyes: PERLA Respiratory: No  evidence of acute respiratory distress  Cervical Spine Area Exam  Skin & Axial Inspection: No masses, redness, edema, swelling, or associated skin lesions Alignment: Symmetrical Functional ROM: Unrestricted ROM      Stability: No instability detected Muscle Tone/Strength: Functionally intact. No obvious neuro-muscular anomalies detected. Sensory (Neurological): Unimpaired Palpation: No palpable anomalies              Upper Extremity (UE) Exam    Side: Right upper extremity  Side: Left upper extremity  Skin & Extremity Inspection: Skin color, temperature, and hair growth are WNL. No peripheral edema or cyanosis. No masses, redness, swelling, asymmetry, or associated skin lesions. No contractures.  Skin & Extremity Inspection: Skin color, temperature, and hair  growth are WNL. No peripheral edema or cyanosis. No masses, redness, swelling, asymmetry, or associated skin lesions. No contractures.  Functional ROM: Unrestricted ROM          Functional ROM: Unrestricted ROM          Muscle Tone/Strength: Functionally intact. No obvious neuro-muscular anomalies detected.  Muscle Tone/Strength: Functionally intact. No obvious neuro-muscular anomalies detected.  Sensory (Neurological): Unimpaired          Sensory (Neurological): Unimpaired          Palpation: No palpable anomalies              Palpation: No palpable anomalies              Provocative Test(s):  Phalen's test: deferred Tinel's test: deferred Apley's scratch test (touch opposite shoulder):  Action 1 (Across chest): deferred Action 2 (Overhead): deferred Action 3 (LB reach): deferred   Provocative Test(s):  Phalen's test: deferred Tinel's test: deferred Apley's scratch test (touch opposite shoulder):  Action 1 (Across chest): deferred Action 2 (Overhead): deferred Action 3 (LB reach): deferred    Thoracic Spine Area Exam  Skin & Axial Inspection: No masses, redness, or swelling Alignment: Symmetrical Functional ROM: Unrestricted ROM Stability: No instability detected Muscle Tone/Strength: Functionally intact. No obvious neuro-muscular anomalies detected. Sensory (Neurological): Unimpaired Muscle strength & Tone: No palpable anomalies  Lumbar Spine Area Exam  Skin & Axial Inspection: No masses, redness, or swelling Alignment: Symmetrical Functional ROM: Unrestricted ROM       Stability: No instability detected Muscle Tone/Strength: Functionally intact. No obvious neuro-muscular anomalies detected. Sensory (Neurological): Unimpaired Palpation: No palpable anomalies       Provocative Tests: Hyperextension/rotation test: deferred today       Lumbar quadrant test (Kemp's test): deferred today       Lateral bending test: deferred today       Patrick's Maneuver: deferred today                    FABER test: deferred today                   S-I anterior distraction/compression test: deferred today         S-I lateral compression test: deferred today         S-I Thigh-thrust test: deferred today         S-I Gaenslen's test: deferred today          Gait & Posture Assessment  Ambulation: Unassisted Gait: Relatively normal for age and body habitus Posture: WNL   Lower Extremity Exam    Side: Right lower extremity  Side: Left lower extremity  Stability: No instability observed          Stability: No instability observed  Skin & Extremity Inspection: Skin color, temperature, and hair growth are WNL. No peripheral edema or cyanosis. No masses, redness, swelling, asymmetry, or associated skin lesions. No contractures.  Skin & Extremity Inspection: Skin color, temperature, and hair growth are WNL. No peripheral edema or cyanosis. No masses, redness, swelling, asymmetry, or associated skin lesions. No contractures.  Functional ROM: Unrestricted ROM                  Functional ROM: Unrestricted ROM                  Muscle Tone/Strength: Functionally intact. No obvious neuro-muscular anomalies detected.  Muscle Tone/Strength: Functionally intact. No obvious neuro-muscular anomalies detected.  Sensory (Neurological): Unimpaired  Sensory (Neurological): Unimpaired  Palpation: No palpable anomalies  Palpation: No palpable anomalies   Assessment  Primary Diagnosis & Pertinent Problem List: The primary encounter diagnosis was Chronic low back pain (Primary Area of Pain) (Bilateral) (R>L) w/ sciatica (Bilateral). Diagnoses of Chronic lower extremity pain (Secondary Area of Pain) (Bilateral) (R>L) and Diabetic peripheral neuropathy (HCC) were also pertinent to this visit.  Status Diagnosis  Persistent Persistent Persistent 1. Chronic low back pain (Primary Area of Pain) (Bilateral) (R>L) w/ sciatica (Bilateral)   2. Chronic lower extremity pain (Secondary Area of Pain)  (Bilateral) (R>L)   3. Diabetic peripheral neuropathy ([place])     Problems updated and reviewed during this visit: No problems updated. Plan of Care  Pharmacotherapy (Medications Ordered): No orders of the defined types were placed in this encounter.  Medications administered today: Bonnetta Laurie Lin had no medications administered during this visit.  Procedure Orders    No procedure(s) ordered today   Lab Orders  No laboratory test(s) ordered today   Imaging Orders  No imaging studies ordered today   Referral Orders  No referral(s) requested today   Interventional management options: Planned, scheduled, and/or pending:    Diagnosticbilateral lumbar facet nerve block (NO STEROIDS) #1  (declined by patient)    Considering:   NOTE: Avoid Steroids due to DM & Hx of DKA  Diagnosticbilateral lumbar facet nerve block (no steroid) Possible bilateral lumbar facet RFA    PRN Procedures:   None at this time   Provider-requested follow-up: No follow-ups on file.  No future appointments. Primary Care Physician: Laurie College, NP Location: [facility] Note by: Laurie Cola, MD Date: 12/09/2017; Time: 9:33 AM

## 2017-12-09 ENCOUNTER — Other Ambulatory Visit: Payer: Self-pay

## 2017-12-09 ENCOUNTER — Encounter: Payer: Self-pay | Admitting: Pain Medicine

## 2017-12-09 ENCOUNTER — Ambulatory Visit: Payer: BLUE CROSS/BLUE SHIELD | Attending: Pain Medicine | Admitting: Pain Medicine

## 2017-12-09 VITALS — BP 147/70 | HR 88 | Temp 97.9°F | Resp 18 | Ht 70.0 in | Wt 169.0 lb

## 2017-12-09 DIAGNOSIS — E11621 Type 2 diabetes mellitus with foot ulcer: Secondary | ICD-10-CM | POA: Insufficient documentation

## 2017-12-09 DIAGNOSIS — E1022 Type 1 diabetes mellitus with diabetic chronic kidney disease: Secondary | ICD-10-CM | POA: Insufficient documentation

## 2017-12-09 DIAGNOSIS — M79604 Pain in right leg: Secondary | ICD-10-CM | POA: Diagnosis not present

## 2017-12-09 DIAGNOSIS — M25551 Pain in right hip: Secondary | ICD-10-CM | POA: Diagnosis not present

## 2017-12-09 DIAGNOSIS — E871 Hypo-osmolality and hyponatremia: Secondary | ICD-10-CM | POA: Insufficient documentation

## 2017-12-09 DIAGNOSIS — E1010 Type 1 diabetes mellitus with ketoacidosis without coma: Secondary | ICD-10-CM | POA: Diagnosis not present

## 2017-12-09 DIAGNOSIS — M533 Sacrococcygeal disorders, not elsewhere classified: Secondary | ICD-10-CM | POA: Diagnosis not present

## 2017-12-09 DIAGNOSIS — D649 Anemia, unspecified: Secondary | ICD-10-CM | POA: Diagnosis not present

## 2017-12-09 DIAGNOSIS — Z79891 Long term (current) use of opiate analgesic: Secondary | ICD-10-CM | POA: Diagnosis not present

## 2017-12-09 DIAGNOSIS — M5442 Lumbago with sciatica, left side: Secondary | ICD-10-CM | POA: Insufficient documentation

## 2017-12-09 DIAGNOSIS — M79605 Pain in left leg: Secondary | ICD-10-CM | POA: Insufficient documentation

## 2017-12-09 DIAGNOSIS — I129 Hypertensive chronic kidney disease with stage 1 through stage 4 chronic kidney disease, or unspecified chronic kidney disease: Secondary | ICD-10-CM | POA: Diagnosis not present

## 2017-12-09 DIAGNOSIS — M5416 Radiculopathy, lumbar region: Secondary | ICD-10-CM | POA: Insufficient documentation

## 2017-12-09 DIAGNOSIS — G894 Chronic pain syndrome: Secondary | ICD-10-CM | POA: Insufficient documentation

## 2017-12-09 DIAGNOSIS — E1036 Type 1 diabetes mellitus with diabetic cataract: Secondary | ICD-10-CM | POA: Diagnosis not present

## 2017-12-09 DIAGNOSIS — N184 Chronic kidney disease, stage 4 (severe): Secondary | ICD-10-CM | POA: Insufficient documentation

## 2017-12-09 DIAGNOSIS — F329 Major depressive disorder, single episode, unspecified: Secondary | ICD-10-CM | POA: Insufficient documentation

## 2017-12-09 DIAGNOSIS — I4892 Unspecified atrial flutter: Secondary | ICD-10-CM | POA: Diagnosis not present

## 2017-12-09 DIAGNOSIS — K7460 Unspecified cirrhosis of liver: Secondary | ICD-10-CM | POA: Insufficient documentation

## 2017-12-09 DIAGNOSIS — K449 Diaphragmatic hernia without obstruction or gangrene: Secondary | ICD-10-CM | POA: Insufficient documentation

## 2017-12-09 DIAGNOSIS — Z886 Allergy status to analgesic agent status: Secondary | ICD-10-CM | POA: Insufficient documentation

## 2017-12-09 DIAGNOSIS — M545 Low back pain: Secondary | ICD-10-CM | POA: Diagnosis present

## 2017-12-09 DIAGNOSIS — M5441 Lumbago with sciatica, right side: Secondary | ICD-10-CM | POA: Insufficient documentation

## 2017-12-09 DIAGNOSIS — M25552 Pain in left hip: Secondary | ICD-10-CM | POA: Insufficient documentation

## 2017-12-09 DIAGNOSIS — M542 Cervicalgia: Secondary | ICD-10-CM | POA: Diagnosis not present

## 2017-12-09 DIAGNOSIS — R748 Abnormal levels of other serum enzymes: Secondary | ICD-10-CM | POA: Insufficient documentation

## 2017-12-09 DIAGNOSIS — E1142 Type 2 diabetes mellitus with diabetic polyneuropathy: Secondary | ICD-10-CM | POA: Insufficient documentation

## 2017-12-09 DIAGNOSIS — K760 Fatty (change of) liver, not elsewhere classified: Secondary | ICD-10-CM | POA: Insufficient documentation

## 2017-12-09 DIAGNOSIS — Z794 Long term (current) use of insulin: Secondary | ICD-10-CM | POA: Insufficient documentation

## 2017-12-09 DIAGNOSIS — G8929 Other chronic pain: Secondary | ICD-10-CM

## 2017-12-09 DIAGNOSIS — K219 Gastro-esophageal reflux disease without esophagitis: Secondary | ICD-10-CM | POA: Insufficient documentation

## 2017-12-09 DIAGNOSIS — Z79899 Other long term (current) drug therapy: Secondary | ICD-10-CM | POA: Insufficient documentation

## 2017-12-09 NOTE — Progress Notes (Signed)
Safety precautions to be maintained throughout the outpatient stay will include: orient to surroundings, keep bed in low position, maintain call bell within reach at all times, provide assistance with transfer out of bed and ambulation.  

## 2017-12-10 LAB — CBC AND DIFFERENTIAL
HEMATOCRIT: 31 — AB (ref 36–46)
HEMOGLOBIN: 9.9 — AB (ref 12.0–16.0)
PLATELETS: 419 — AB (ref 150–399)
WBC: 9

## 2017-12-10 LAB — BASIC METABOLIC PANEL
BUN: 30 — AB (ref 4–21)
Creatinine: 1.5 — AB (ref 0.5–1.1)
Glucose: 171
Potassium: 5 (ref 3.4–5.3)
Sodium: 139 (ref 137–147)

## 2017-12-14 ENCOUNTER — Ambulatory Visit: Payer: BLUE CROSS/BLUE SHIELD | Admitting: Pain Medicine

## 2018-03-10 ENCOUNTER — Other Ambulatory Visit: Payer: Self-pay | Admitting: Nurse Practitioner

## 2018-03-10 DIAGNOSIS — K219 Gastro-esophageal reflux disease without esophagitis: Secondary | ICD-10-CM

## 2018-03-10 DIAGNOSIS — K449 Diaphragmatic hernia without obstruction or gangrene: Secondary | ICD-10-CM

## 2018-03-10 NOTE — Telephone Encounter (Signed)
Pt called requesting a refill for  Nexium  40 mg called into  [facility]

## 2018-03-11 MED ORDER — ESOMEPRAZOLE MAGNESIUM 40 MG PO CPDR: 40.0000 mg | DELAYED_RELEASE_CAPSULE | Freq: Two times a day (BID) | ORAL | 0 refills | Status: DC

## 2018-03-31 LAB — IRON,TIBC AND FERRITIN PANEL: Iron: 82

## 2018-03-31 LAB — BASIC METABOLIC PANEL
BUN: 37 — AB (ref 4–21)
Creatinine: 1.7 — AB (ref 0.5–1.1)
Glucose: 84
Potassium: 4.9 (ref 3.4–5.3)
SODIUM: 138 (ref 137–147)

## 2018-04-01 LAB — CBC AND DIFFERENTIAL
HEMATOCRIT: 32 — AB (ref 36–46)
HEMOGLOBIN: 10.3 — AB (ref 12.0–16.0)
PLATELETS: 401 — AB (ref 150–399)
WBC: 7.6

## 2018-04-07 NOTE — Progress Notes (Signed)
12  

## 2018-04-19 ENCOUNTER — Other Ambulatory Visit: Payer: Self-pay | Admitting: Nurse Practitioner

## 2018-04-19 DIAGNOSIS — K449 Diaphragmatic hernia without obstruction or gangrene: Secondary | ICD-10-CM

## 2018-04-19 DIAGNOSIS — K219 Gastro-esophageal reflux disease without esophagitis: Secondary | ICD-10-CM

## 2018-06-16 ENCOUNTER — Other Ambulatory Visit (HOSPITAL_COMMUNITY)
Admission: RE | Admit: 2018-06-16 | Discharge: 2018-06-16 | Disposition: A | Discharge status: Home / Self Care | Payer: BLUE CROSS/BLUE SHIELD | Source: Ambulatory Visit | Attending: Obstetrics and Gynecology | Admitting: Obstetrics and Gynecology

## 2018-06-16 ENCOUNTER — Ambulatory Visit (INDEPENDENT_AMBULATORY_CARE_PROVIDER_SITE_OTHER): Payer: BLUE CROSS/BLUE SHIELD | Admitting: Obstetrics and Gynecology

## 2018-06-16 ENCOUNTER — Encounter: Payer: Self-pay | Admitting: Obstetrics and Gynecology

## 2018-06-16 VITALS — BP 134/74 | HR 85 | Ht 70.5 in | Wt 186.0 lb

## 2018-06-16 DIAGNOSIS — N950 Postmenopausal bleeding: Secondary | ICD-10-CM | POA: Insufficient documentation

## 2018-06-16 NOTE — Progress Notes (Signed)
Obstetrics & Gynecology Office Visit   Chief Complaint  Patient presents with  . Vaginal Bleeding    slight brown bleeding sm. odor    History of Present Illness: 63 y.o. menopausal female who presents with vaginal bleeding.  The bleeding started about a week ago.  She noted the bleeding when she was having a bowel movement.  The blood was on the toilet tissue and on a pad.  The bleeding soaked through the pad. The blood was dark brown.  She continues to have a light bleeding. She wakes in the morning with only a drop or two on her pad. During the day, however, the bleeding is worse the longer she is up and moving around.  This is also dark colored. She believes there might be small clots.  She has had pain on her left side off-and-on (left lower quadrant). The pain is not constant.  She went through menopause at age 46 or so.  This is the first time she has had any bleeding since menopause.  She has gained some weight since a year ago.  She is unsure of whether she has new bloating.  She notes constipation over the past year.  It has been a long time since her last pap smear.  Her last one was normal. She originally states that she has never had an abnormal pap smear. She then tearfully notes that she had a positive HPV at her last pap smear.  She is intermittently tearful throughout the interview when issues with her husband arise.  She does not want him to have any of this information. He is waiting in the lobby for her.   Last colonoscopy: never has had Last mammogram: long time ago, normal (7-8 years ago, perhaps).   Past Medical History:  Diagnosis Date  . Diabetes mellitus without complication ([place])   . GERD (gastroesophageal reflux disease)   . Hypertension   . Renal disorder    stage 3    Past Surgical History:  Procedure Laterality Date  . DILATION AND CURETTAGE OF UTERUS     Miscarriage  . EYE SURGERY     bilateral caterac  . FINGER SURGERY    . KNEE ARTHROSCOPY Left 1989   [facility]  . PILONIDAL CYST DRAINAGE      Gynecologic History: Patient's last menstrual period was 09/09/2004 (approximate).   Family History  Problem Relation Age of Onset  . Diabetes Mother     Social History   Socioeconomic History  . Marital status: Married    Spouse name: Not on file  . Number of children: Not on file  . Years of education: Not on file  . Highest education level: Not on file  Occupational History  . Not on file  Social Needs  . Financial resource strain: Not on file  . Food insecurity:    Worry: Not on file    Inability: Not on file  . Transportation needs:    Medical: Not on file    Non-medical: Not on file  Tobacco Use  . Smoking status: Never Smoker  . Smokeless tobacco: Never Used  Substance and Sexual Activity  . Alcohol use: Yes    Comment: heavy drinker  . Drug use: No  . Sexual activity: Yes  Lifestyle  . Physical activity:    Days per week: Not on file    Minutes per session: Not on file  . Stress: Not on file  Relationships  . Social connections:    Talks  on phone: Not on file    Gets together: Not on file    Attends religious service: Not on file    Active member of club or organization: Not on file    Attends meetings of clubs or organizations: Not on file    Relationship status: Not on file  . Intimate partner violence:    Fear of current or ex partner: Not on file    Emotionally abused: Not on file    Physically abused: Not on file    Forced sexual activity: Not on file  Other Topics Concern  . Not on file  Social History Narrative  . Not on file    Allergies  Allergen Reactions  . Ibuprofen Nausea Only  . Nsaids     Ulcerative stomach and small intestines  . Latex Itching    Prior to Admission medications   Medication Sig Start Date End Date Taking? Authorizing Provider  acetaminophen (TYLENOL) 500 MG tablet Take 1,000 mg by mouth 2 (two) times daily as needed.    [provider]  cyclobenzaprine  (FLEXERIL) 10 MG tablet Take 10 mg by mouth 3 (three) times daily as needed for muscle spasms.    [provider]  esomeprazole (NEXIUM) 40 MG capsule Take 1 capsule (40 mg total) by mouth 2 (two) times daily. NEED APPT 03/11/18   Mikey College, NP  furosemide (LASIX) 20 MG tablet Take 20 mg by mouth daily.    [provider]  insulin aspart (NOVOLOG) 100 UNIT/ML injection Inject 0-9 Units into the skin 3 (three) times daily with meals. BS 70-120 - 0 units       121-150 - 1 units       151-200 - 2 units        201-250 - 3 units        251-300 - 4 units        301-350 - 5 units        351-400 - 6 units        > 400 call md. 03/27/17   Henreitta Leber, MD  insulin glargine (LANTUS) 100 UNIT/ML injection Inject 0.07 mLs (7 Units total) into the skin at bedtime. Patient taking differently: Inject 14 Units into the skin at bedtime.  03/27/17   Henreitta Leber, MD  lisinopril (PRINIVIL,ZESTRIL) 2.5 MG tablet Take 1 tablet (2.5 mg total) by mouth daily. 05/28/17   Mikey College, NP  Multiple Vitamin (MULTIVITAMIN WITH MINERALS) TABS tablet Take 1 tablet by mouth daily. 03/28/17   Henreitta Leber, MD  [facility] DELICA LANCETS 27O MISC 1 Device by Does not apply route 4 (four) times daily. 05/20/17   Mikey College, NP    Review of Systems  Constitutional: Negative.   HENT: Negative.   Eyes: Negative.   Respiratory: Negative.   Cardiovascular: Negative.   Gastrointestinal: Negative.   Genitourinary: Negative.   Musculoskeletal: Negative.   Skin: Negative.   Neurological: Negative.   Psychiatric/Behavioral: Negative.      Physical Exam BP 134/74 (BP Location: Left Arm, Patient Position: Sitting, Cuff Size: Normal)   Pulse 85   Ht 5' 10.5" (1.791 m)   Wt 186 lb (84.4 kg)   LMP 09/09/2004 (Approximate)   BMI 26.31 kg/m  Patient's last menstrual period was 09/09/2004 (approximate). Physical Exam Constitutional:      General: She is not in acute  distress.    Appearance: Normal appearance. She is well-developed.  Genitourinary:  Pelvic exam was performed with patient supine.     Vulva, inguinal canal, urethra, bladder, vagina, uterus, right adnexa and left adnexa normal.     No posterior fourchette tenderness, injury or lesion present.     Cervical bleeding (dark-brown blood intermingled with cervical mucus) present.     No cervical friability, lesion or polyp.     Genitourinary Comments: Exam limited by patient body habitus.  HENT:     Head: Normocephalic and atraumatic.  Eyes:     General: No scleral icterus.    Conjunctiva/sclera: Conjunctivae normal.  Neck:     Musculoskeletal: Normal range of motion and neck supple.  Cardiovascular:     Rate and Rhythm: Normal rate and regular rhythm.     Heart sounds: No murmur. No friction rub. No gallop.   Pulmonary:     Effort: Pulmonary effort is normal. No respiratory distress.     Breath sounds: Normal breath sounds. No wheezing or rales.  Abdominal:     General: Bowel sounds are normal. There is no distension.     Palpations: Abdomen is soft. There is no mass.     Tenderness: There is no abdominal tenderness. There is no guarding or rebound.  Musculoskeletal: Normal range of motion.  Neurological:     General: No focal deficit present.     Mental Status: She is alert and oriented to person, place, and time.     Cranial Nerves: No cranial nerve deficit.  Skin:    General: Skin is warm and dry.     Findings: No erythema.  Psychiatric:        Mood and Affect: Mood normal.        Behavior: Behavior normal.        Judgment: Judgment normal.    Endometrial Biopsy After discussion with the patient regarding her abnormal uterine bleeding I recommended that she proceed with an endometrial biopsy for further diagnosis. The risks, benefits, alternatives, and indications for an endometrial biopsy were discussed with the patient in detail. She understood the risks including  infection, bleeding, cervical laceration and uterine perforation.  Verbal consent was obtained.   PROCEDURE NOTE:  Pipelle endometrial biopsy was performed using aseptic technique with iodine preparation.  The uterus was sounded to a length of 7 cm.  Adequate sampling was obtained with minimal blood loss.  The patient tolerated the procedure well.  Disposition will be pending pathology.  Female chaperone present for pelvic and breast  portions of the physical exam  Assessment: 63 y.o. [phone] female here for  1. Postmenopausal bleeding      Plan: Problem List Items Addressed This Visit    None    Visit Diagnoses    Postmenopausal bleeding    -  Primary   Relevant Orders   Cytology - PAP   Surgical pathology   US PELVIS TRANSVANGINAL NON-OB (TV ONLY)     Endometrial biopsy and pap smear performed today. Will get pelvic ultrasound ASAP for patient.  Will discuss disposition of bleeding based on this potentially serious condition. We did discuss the risk of cancer in menopausal bleeding and she voiced understanding that follow up is essential given this fact.  Prentice Docker, MD 06/16/2018 6:03 PM

## 2018-06-18 LAB — CYTOLOGY - PAP
Diagnosis: NEGATIVE
HPV (WINDOPATH): NOT DETECTED

## 2018-06-21 ENCOUNTER — Telehealth: Payer: Self-pay

## 2018-06-21 NOTE — Telephone Encounter (Signed)
Pt calling for biopsy results.  [phone]

## 2018-06-23 ENCOUNTER — Telehealth: Payer: Self-pay | Admitting: Obstetrics and Gynecology

## 2018-06-23 NOTE — Telephone Encounter (Signed)
Pt calling to see if injection was given at appt last week.  Adv no injection was given but she was cleaned off with betadine which contains iodine which is made from shellfish.  Pt is not allergic to shellfish.  Pt also wants to know if she still needs the u/s sched on the 10th.  She is thinking that since she hasn't heard from the bx that everything is okay and she doesn't need u/s.  Adv will send msg to [facility]

## 2018-06-23 NOTE — Telephone Encounter (Signed)
Patient is calling for labs results. Please advise. 

## 2018-06-28 NOTE — Telephone Encounter (Signed)
Please encourage patient to follow up with ultrasound. The pathology was normal, as well as the pap smear.  Somehow neither of these results showed up in my inbox.  Laurie Lin has been having the same issue. If she has any questions, let me know.  The ultrasound is important because there could be there structural issues going on (like a polyp) that could explain her bleeding and it would be important to know about those.   Thank you.

## 2018-06-28 NOTE — Telephone Encounter (Signed)
Pt will keep appt tomorrow. 

## 2018-06-28 NOTE — Telephone Encounter (Signed)
See prior response.

## 2018-06-29 ENCOUNTER — Encounter: Payer: Self-pay | Admitting: Obstetrics and Gynecology

## 2018-06-29 ENCOUNTER — Ambulatory Visit (INDEPENDENT_AMBULATORY_CARE_PROVIDER_SITE_OTHER): Payer: BLUE CROSS/BLUE SHIELD

## 2018-06-29 ENCOUNTER — Ambulatory Visit (INDEPENDENT_AMBULATORY_CARE_PROVIDER_SITE_OTHER): Payer: BLUE CROSS/BLUE SHIELD | Admitting: Obstetrics and Gynecology

## 2018-06-29 VITALS — BP 128/88 | Ht 70.0 in | Wt 185.0 lb

## 2018-06-29 DIAGNOSIS — N950 Postmenopausal bleeding: Secondary | ICD-10-CM

## 2018-06-29 NOTE — Progress Notes (Signed)
Gynecology Ultrasound Follow Up   Chief Complaint  Patient presents with  . Follow-up  postmenopausal bleeding   History of Present Illness: Patient is a 63 y.o. female who presents today for ultrasound evaluation of the above .  Ultrasound demonstrates the following findings Adnexa: no masses seen  Uterus: anteverted with endometrial stripe  2.9 mm Additional: no abnormalities noted  Past Medical History:  Diagnosis Date  . Diabetes mellitus without complication ([place])   . GERD (gastroesophageal reflux disease)   . Hypertension   . Renal disorder    stage 3     Past Surgical History:  Procedure Laterality Date  . DILATION AND CURETTAGE OF UTERUS     Miscarriage  . EYE SURGERY     bilateral caterac  . FINGER SURGERY    . KNEE ARTHROSCOPY Left 1989   [facility]  . PILONIDAL CYST DRAINAGE      Family History  Problem Relation Age of Onset  . Diabetes Mother     Social History   Socioeconomic History  . Marital status: Married    Spouse name: Not on file  . Number of children: Not on file  . Years of education: Not on file  . Highest education level: Not on file  Occupational History  . Not on file  Social Needs  . Financial resource strain: Not on file  . Food insecurity:    Worry: Not on file    Inability: Not on file  . Transportation needs:    Medical: Not on file    Non-medical: Not on file  Tobacco Use  . Smoking status: Never Smoker  . Smokeless tobacco: Never Used  Substance and Sexual Activity  . Alcohol use: Yes    Comment: heavy drinker  . Drug use: No  . Sexual activity: Yes  Lifestyle  . Physical activity:    Days per week: Not on file    Minutes per session: Not on file  . Stress: Not on file  Relationships  . Social connections:    Talks on phone: Not on file    Gets together: Not on file    Attends religious service: Not on file    Active member of club or organization: Not on file    Attends meetings of clubs or organizations:  Not on file    Relationship status: Not on file  . Intimate partner violence:    Fear of current or ex partner: Not on file    Emotionally abused: Not on file    Physically abused: Not on file    Forced sexual activity: Not on file  Other Topics Concern  . Not on file  Social History Narrative  . Not on file    Allergies  Allergen Reactions  . Ibuprofen Nausea Only  . Nsaids     Ulcerative stomach and small intestines  . Latex Itching    Prior to Admission medications   Medication Sig Start Date End Date Taking? Authorizing Provider  acetaminophen (TYLENOL) 500 MG tablet Take 1,000 mg by mouth 2 (two) times daily as needed.    [provider]  cyclobenzaprine (FLEXERIL) 10 MG tablet Take 10 mg by mouth 3 (three) times daily as needed for muscle spasms.    [provider]  esomeprazole (NEXIUM) 40 MG capsule Take 1 capsule (40 mg total) by mouth 2 (two) times daily. NEED APPT 03/11/18   Mikey College, NP  furosemide (LASIX) 20 MG tablet Take 20 mg by mouth  daily.    [provider]  insulin aspart (NOVOLOG) 100 UNIT/ML injection Inject 0-9 Units into the skin 3 (three) times daily with meals. BS 70-120 - 0 units       121-150 - 1 units       151-200 - 2 units        201-250 - 3 units        251-300 - 4 units        301-350 - 5 units        351-400 - 6 units        > 400 call md. 03/27/17   Henreitta Leber, MD  insulin glargine (LANTUS) 100 UNIT/ML injection Inject 0.07 mLs (7 Units total) into the skin at bedtime. Patient taking differently: Inject 14 Units into the skin at bedtime.  03/27/17   Henreitta Leber, MD  lisinopril (PRINIVIL,ZESTRIL) 2.5 MG tablet Take 1 tablet (2.5 mg total) by mouth daily. 05/28/17   Mikey College, NP  Multiple Vitamin (MULTIVITAMIN WITH MINERALS) TABS tablet Take 1 tablet by mouth daily. 03/28/17   Henreitta Leber, MD  [facility] DELICA LANCETS 34H MISC 1 Device by Does not apply route 4 (four) times daily.  05/20/17   Mikey College, NP    Physical Exam BP 128/88   Ht 5\' 10"  (1.778 m)   Wt 185 lb (83.9 kg)   LMP 09/09/2004 (Approximate)   BMI 26.54 kg/m    General: NAD HEENT: normocephalic, anicteric Pulmonary: No increased work of breathing Extremities: no edema, erythema, or tenderness Neurologic: Grossly intact, normal gait Psychiatric: mood appropriate, affect full  Imaging Results US Pelvis Transvanginal Non-ob (tv Only)  Result Date: 06/29/2018 Patient Name: JEIDY HOERNER DOB: 03-Jun-1955 MRN: [id number] ULTRASOUND REPORT Location: [facility] Date of Service: 06/29/2018 Indications: Postmenopausal bleeding Findings: The uterus is anteverted and measures 6.2 x 3.3 x 2.3cm. Echo texture is homogenous without evidence of focal masses. The Endometrium measures 2.9 mm. Bilateral ovaries are not seen. Survey of the adnexa demonstrates no adnexal masses. Trace amount of fluid within the cervical canal. Impression: 1. Normal endometrial canal for postmenopausal patient. Vita Barley, RDMS RVT The ultrasound images and findings were reviewed by me and I agree with the above report. Prentice Docker, MD, [facility], [facility] 06/29/2018 10:52 AM       Assessment: 63 y.o. [id number]  1. Postmenopausal bleeding      Plan: Problem List Items Addressed This Visit    None    Visit Diagnoses    Postmenopausal bleeding    -  Primary     Reviewed ultrasound findings along with pap smear and endometrial biopsy. All findings reassuring for atrophic endometrium.  Precautions reviewed and it was emphasized to the patient that she still needs to come back for new bleeding, as cancer can still develop at a later point. She voiced understanding and agreement.  15 minutes spent in face to face discussion with > 50% spent in counseling,management, and coordination of care of her postmenopausal bleeding.   Prentice Docker, MD, [facility], [facility] 06/29/2018 11:01 AM

## 2018-08-23 ENCOUNTER — Encounter: Payer: Self-pay | Admitting: Family Medicine

## 2018-08-23 ENCOUNTER — Ambulatory Visit (INDEPENDENT_AMBULATORY_CARE_PROVIDER_SITE_OTHER): Payer: BLUE CROSS/BLUE SHIELD | Admitting: Family Medicine

## 2018-08-23 ENCOUNTER — Other Ambulatory Visit: Payer: Self-pay

## 2018-08-23 DIAGNOSIS — K219 Gastro-esophageal reflux disease without esophagitis: Secondary | ICD-10-CM

## 2018-08-23 DIAGNOSIS — G894 Chronic pain syndrome: Secondary | ICD-10-CM

## 2018-08-23 DIAGNOSIS — K449 Diaphragmatic hernia without obstruction or gangrene: Secondary | ICD-10-CM

## 2018-08-23 MED ORDER — ESOMEPRAZOLE MAGNESIUM 40 MG PO CPDR
40.0000 mg | DELAYED_RELEASE_CAPSULE | Freq: Two times a day (BID) | ORAL | 1 refills | Status: DC
Start: 2018-08-23 — End: 2019-02-16

## 2018-08-23 NOTE — Assessment & Plan Note (Signed)
Chronic GERD w/ hiatal hernia Controlled on high dose PPI - for >40-50 years  Refill Esomeprazole 40mg  BID at this time 90 day supply

## 2018-08-23 NOTE — Progress Notes (Signed)
Virtual Visit via Telephone The purpose of this virtual visit is to provide medical care while limiting exposure to the novel coronavirus (COVID19) for both patient and office staff.  Consent was obtained for phone visit:  Yes.   Answered questions that patient had about telehealth interaction:  Yes.   I discussed the limitations, risks, security and privacy concerns of performing an evaluation and management service by telephone. I also discussed with the patient that there may be a patient responsible charge related to this service. The patient expressed understanding and agreed to proceed.  Patient Location: Home Provider Location: [facility] (Office)  PCP is Cassell Smiles, AGPCNP-BC - I am currently covering during her maternity leave.   ---------------------------------------------------------------------- Chief Complaint  Patient presents with  . Back Pain  . Gastroesophageal Reflux    S: Reviewed CMA documentation. I have called patient and gathered additional HPI as follows:  GERD / Hiatal - Reports chronic GERD issue, has been on PPI medication since 1976. She does well on continued PPI high dose Esomeprazole 40mg  BID. Due for refill. Denies active abdominal pain, regurgitation, nausea vomiting  Right Shoulder Pain / Chronic Pain / Back Pain Previously followed by [facility] Dr Dossie Arbour, last 11/2017, could not keep follow up with their office, she has had issues with bulging disc and MRI, and chronic issue. - She cannot take NSAIDs due to CKD - Has had issues with high doses Tylenol as well - Steroids affect her blood sugar as T1DM   Denies any high risk travel to areas of current concern for [place]. Denies any known or suspected exposure to person with or possibly with COVID19.  Denies any fevers, chills, sweats, body ache, cough, shortness of breath, sinus pain or pressure, headache, abdominal pain, diarrhea  Past Medical History:  Diagnosis  Date  . Diabetes mellitus without complication ([place])   . GERD (gastroesophageal reflux disease)   . Hypertension   . Renal disorder    stage 3    Social History   Tobacco Use  . Smoking status: Never Smoker  . Smokeless tobacco: Never Used  Substance Use Topics  . Alcohol use: Yes    Comment: heavy drinker  . Drug use: No    Current Outpatient Medications:  .  acetaminophen (TYLENOL) 500 MG tablet, Take 1,000 mg by mouth 2 (two) times daily as needed., Disp: , Rfl:  .  BD ULTRA-FINE PEN NEEDLES 29G X 12.7MM MISC, 1 EACH BY MISCELLANEOUS ROUTE FIVE (5) TIMES A DAY., Disp: , Rfl:  .  COMBIGAN 0.2-0.5 % ophthalmic solution, TAKE 1 DROP IN RIGHT EYE TWICE DAILY, Disp: , Rfl:  .  cyclobenzaprine (FLEXERIL) 10 MG tablet, Take 10 mg by mouth 3 (three) times daily as needed for muscle spasms., Disp: , Rfl:  .  erythromycin ophthalmic ointment, APPLY 1 A SMALL AMOUNT IN RIGHT EYE 3 TIMES A DAY FOR 3 DAYS, Disp: , Rfl:  .  esomeprazole (NEXIUM) 40 MG capsule, Take 1 capsule (40 mg total) by mouth 2 (two) times daily before a meal., Disp: 180 capsule, Rfl: 1 .  furosemide (LASIX) 20 MG tablet, Take 20 mg by mouth daily., Disp: , Rfl:  .  insulin aspart (NOVOLOG) 100 UNIT/ML injection, Inject 0-9 Units into the skin 3 (three) times daily with meals. BS 70-120 - 0 units       121-150 - 1 units       151-200 - 2 units  201-250 - 3 units        251-300 - 4 units        301-350 - 5 units        351-400 - 6 units        > 400 call md., Disp: 10 mL, Rfl: 11 .  Insulin Glargine (BASAGLAR KWIKPEN) 100 UNIT/ML SOPN, Inject 12 Units into the skin at bedtime. , Disp: , Rfl:  .  Insulin Pen Needle (FIFTY50 PEN NEEDLES) 32G X 4 MM MISC, ok to sub any brand or size needle preferred by insurance/patient, use up to 5x/day, dx E10.65, Disp: , Rfl:  .  lisinopril (PRINIVIL,ZESTRIL) 2.5 MG tablet, Take 1 tablet (2.5 mg total) by mouth daily., Disp: 30 tablet, Rfl: 5 .  Multiple Vitamin (MULTIVITAMIN WITH  MINERALS) TABS tablet, Take 1 tablet by mouth daily., Disp: , Rfl:  .  ONETOUCH DELICA LANCETS 07M MISC, 1 Device by Does not apply route 4 (four) times daily., Disp: 100 each, Rfl: 11  Depression screen [facility] 2/9 12/09/2017 10/21/2017 09/09/2017  Decreased Interest 0 0 0  Down, Depressed, Hopeless 0 0 0  PHQ - 2 Score 0 0 0  Altered sleeping - - -  Tired, decreased energy - - -  Change in appetite - - -  Feeling bad or failure about yourself  - - -  Trouble concentrating - - -  Moving slowly or fidgety/restless - - -  Suicidal thoughts - - -  PHQ-9 Score - - -    No flowsheet data found.  -------------------------------------------------------------------------- O: No physical exam performed due to remote telephone encounter.   Recent Results (from the past 2160 hour(s))  Cytology - PAP     Status: None   Collection Time: 06/16/18 12:00 AM  Result Value Ref Range   Adequacy      Satisfactory for evaluation  endocervical/transformation zone component PRESENT.   Diagnosis      NEGATIVE FOR INTRAEPITHELIAL LESIONS OR MALIGNANCY.   HPV NOT DETECTED     Comment: Normal Reference Range - NOT Detected   Material Submitted CervicoVaginal Pap [ThinPrep Imaged]    CYTOLOGY - PAP PAP RESULT     -------------------------------------------------------------------------- A&P:  Problem List Items Addressed This Visit    Chronic pain syndrome (Chronic)    Chronic pain in multiple areas, primarily back pain, history of bulging disc and OA/DJD Also shoulder pain Previously w/ [facility] - last 11/2017, no longer following due to cost and limited benefit from injections and procedures - Failed Muscle relaxants, Tylenol. Contraindicated from NSAIDs due to CKD. Cannot take Prednisone due to T1DM  Plan - Discussed limited options at this time, offered re try muscle relaxant, she declined - Advised can max safe dose of Tylenol for her, but she had issues on this - Recommend that she may return in  person with PCP within next 3 months for a re-evaluation of her chronic pain, as it has been 1 year since returning to care. She may warrant chronic management with medication like Tramadol due to her CKD limitations. Otherwise, recommend PCP can refer to a different Pain Management provider if interested as next option.      Hiatal hernia    Chronic GERD w/ hiatal hernia Controlled on high dose PPI - for >40-50 years  Refill Esomeprazole 40mg  BID at this time 90 day supply      Relevant Medications   esomeprazole (NEXIUM) 40 MG capsule    Other Visit Diagnoses  Gastroesophageal reflux disease, esophagitis presence not specified    -  Primary   Relevant Medications   esomeprazole (NEXIUM) 40 MG capsule      Meds ordered this encounter  Medications  . esomeprazole (NEXIUM) 40 MG capsule    Sig: Take 1 capsule (40 mg total) by mouth 2 (two) times daily before a meal.    Dispense:  180 capsule    Refill:  1    Follow-up: - Return in 3 months for Chronic Pain / Back/Shoulder Pain / GERD - w/ PCP  Patient verbalizes understanding with the above medical recommendations including the limitation of remote medical advice.  Specific follow-up and call-back criteria were given for patient to follow-up or seek medical care more urgently if needed.   - Time spent in direct consultation with patient on phone: 8 minutes  Nobie Putnam, [facility] 08/23/2018, 11:07 AM

## 2018-08-23 NOTE — Patient Instructions (Addendum)
AVS info given by phone. No MyChart access

## 2018-08-23 NOTE — Assessment & Plan Note (Signed)
Chronic pain in multiple areas, primarily back pain, history of bulging disc and OA/DJD Also shoulder pain Previously w/ [facility] - last 11/2017, no longer following due to cost and limited benefit from injections and procedures - Failed Muscle relaxants, Tylenol. Contraindicated from NSAIDs due to CKD. Cannot take Prednisone due to T1DM  Plan - Discussed limited options at this time, offered re try muscle relaxant, she declined - Advised can max safe dose of Tylenol for her, but she had issues on this - Recommend that she may return in person with PCP within next 3 months for a re-evaluation of her chronic pain, as it has been 1 year since returning to care. She may warrant chronic management with medication like Tramadol due to her CKD limitations. Otherwise, recommend PCP can refer to a different Pain Management provider if interested as next option.

## 2018-11-09 LAB — HEMOGLOBIN A1C: Hemoglobin A1C: 6.5

## 2018-11-23 ENCOUNTER — Ambulatory Visit: Payer: Self-pay | Admitting: Nurse Practitioner

## 2018-12-01 ENCOUNTER — Ambulatory Visit: Payer: Self-pay | Admitting: Nurse Practitioner

## 2018-12-02 ENCOUNTER — Ambulatory Visit (INDEPENDENT_AMBULATORY_CARE_PROVIDER_SITE_OTHER): Payer: BC Managed Care – PPO | Admitting: Nurse Practitioner

## 2018-12-02 ENCOUNTER — Other Ambulatory Visit: Payer: Self-pay

## 2018-12-02 ENCOUNTER — Encounter: Payer: Self-pay | Admitting: Nurse Practitioner

## 2018-12-02 VITALS — BP 151/64 | HR 79 | Ht 70.0 in | Wt 187.8 lb

## 2018-12-02 DIAGNOSIS — R600 Localized edema: Secondary | ICD-10-CM

## 2018-12-02 DIAGNOSIS — G8929 Other chronic pain: Secondary | ICD-10-CM

## 2018-12-02 DIAGNOSIS — I10 Essential (primary) hypertension: Secondary | ICD-10-CM

## 2018-12-02 DIAGNOSIS — M5441 Lumbago with sciatica, right side: Secondary | ICD-10-CM

## 2018-12-02 DIAGNOSIS — N182 Chronic kidney disease, stage 2 (mild): Secondary | ICD-10-CM

## 2018-12-02 DIAGNOSIS — F419 Anxiety disorder, unspecified: Secondary | ICD-10-CM

## 2018-12-02 DIAGNOSIS — M5442 Lumbago with sciatica, left side: Secondary | ICD-10-CM

## 2018-12-02 MED ORDER — FUROSEMIDE 20 MG PO TABS: 20.0000 mg | ORAL_TABLET | Freq: Every day | ORAL | 0 refills | Status: DC | PRN

## 2018-12-02 MED ORDER — SERTRALINE HCL 50 MG PO TABS: 50.0000 mg | ORAL_TABLET | Freq: Every day | ORAL | 3 refills | Status: DC

## 2018-12-02 MED ORDER — HYDROXYZINE HCL 10 MG PO TABS
10.0000 mg | ORAL_TABLET | Freq: Three times a day (TID) | ORAL | 0 refills | Status: DC | PRN
Start: 2018-12-02 — End: 2019-10-28

## 2018-12-02 MED ORDER — LISINOPRIL 2.5 MG PO TABS: 2.5000 mg | ORAL_TABLET | Freq: Every day | ORAL | 5 refills | Status: DC

## 2018-12-02 NOTE — Progress Notes (Signed)
Subjective:    Patient ID: Laurie Lin Lin, female    DOB: 1956-03-01, 63 y.o.   MRN: [id number]  Laurie Lin Lin is a 63 y.o. female presenting on 12/02/2018 for Back Pain (chronic back pain, she was seen at [facility] and had a bad experience )   HPI Back Pain Patient has most recently been seen at [facility] for known spinal stenosis.  Patient was offered Facet joint injection, declined.  Used Tramadol instead.  Patient had scheduling conflicts with CGM application for initial use of CGM so she reports going back the next day for pain contract signing.  This was deemed to be inappropriate by [facility], understandably so given controlled substance abuse considerations for general population.  Patient was unable to return.   - Patient saw Dr. Dossie Arbour and "didn't do any good either."  RF ablation was offered, but patient declined. - Patient prefers to try to return to [facility]. If not, Dr. Nathanial Rancher referral is desired.  Anxiety Wants to discuss anxiety.  Nervous and jumpy all the time. Patient doesn't drive long distances.  Drives near her home due to anxiety, nervousness.   - Continues to be nervous with someone else driving.   - This is new in the last year  Hypertension - She is not checking BP at home or outside of clinic.    - Current medications: lisinopril 2.5 mg daily and furosemide 20 mg once daily (mostly for edema), tolerating well without side effects - She is not currently symptomatic. - Pt denies headache, lightheadedness, dizziness, changes in vision, chest tightness/pressure, palpitations, leg swelling, sudden loss of speech or loss of consciousness. - She  reports no regular exercise routine. - Her diet is moderate in salt, moderate in fat, and moderate in carbohydrates.   Social History   Tobacco Use  . Smoking status: Never Smoker  . Smokeless tobacco: Never Used  Substance Use Topics  . Alcohol use: Yes    Alcohol/week: 6.0 standard drinks   Types: 3 Cans of beer, 3 Shots of liquor per week    Comment: heavy drinker  . Drug use: No    Review of Systems Per HPI unless specifically indicated above     Objective:    BP (!) 151/64   Pulse 79   Ht 5\' 10"  (1.778 m)   Wt 187 lb 12.8 oz (85.2 kg)   LMP 09/09/2004 (Approximate)   BMI 26.95 kg/m   Wt Readings from Last 3 Encounters:  12/02/18 187 lb 12.8 oz (85.2 kg)  06/29/18 185 lb (83.9 kg)  06/16/18 186 lb (84.4 kg)    Physical Exam Vitals signs reviewed.  Constitutional:      General: She is not in acute distress.    Appearance: She is well-developed.  HENT:     Head: Normocephalic and atraumatic.  Cardiovascular:     Rate and Rhythm: Normal rate and regular rhythm.     Pulses:          Radial pulses are 2+ on the right side and 2+ on the left side.       Posterior tibial pulses are 1+ on the right side and 1+ on the left side.     Heart sounds: Normal heart sounds, S1 normal and S2 normal.  Pulmonary:     Effort: Pulmonary effort is normal. No respiratory distress.     Breath sounds: Normal breath sounds and air entry.  Musculoskeletal:     Right lower leg:  No edema.     Left lower leg: No edema.     Comments: Low Back Inspection: Normal appearance, Normal body habitus, no spinal deformity, symmetrical. Palpation: No tenderness over spinous processes. Bilateral lumbar paraspinal muscles tender and with hypertonicity/spasm. ROM: Slightly limited AROM forward flex / back extension, rotation L/R without discomfort Special Testing: Seated SLR negative for radicular pain bilaterally  Strength: Bilateral hip flex/ext 5/5, knee flex/ext 5/5, ankle dorsiflex/plantarflex 5/5 Neurovascular: intact distal sensation to light touch   Skin:    General: Skin is warm and dry.     Capillary Refill: Capillary refill takes less than 2 seconds.  Neurological:     Mental Status: She is alert and oriented to person, place, and time.  Psychiatric:        Attention and  Perception: Attention normal.        Mood and Affect: Mood and affect normal.        Behavior: Behavior normal. Behavior is cooperative.      Results for orders placed or performed in visit on 06/16/18  Cytology - PAP  Result Value Ref Range   Adequacy      Satisfactory for evaluation  endocervical/transformation zone component PRESENT.   Diagnosis      NEGATIVE FOR INTRAEPITHELIAL LESIONS OR MALIGNANCY.   HPV NOT DETECTED    Material Submitted CervicoVaginal Pap [ThinPrep Imaged]    CYTOLOGY - PAP PAP RESULT       Assessment & Plan:   Problem List Items Addressed This Visit      Cardiovascular and Mediastinum   Essential hypertension Controlled hypertension.  BP goal < 130/80.  Pt is working on lifestyle modifications.  Taking medications tolerating well without side effects. No current complications other than stable CKD.  Plan: 1. Continue taking medications as prescribed.  Prefer to reduce use of furosemide, but patient prefers to continue. 2. Obtain labs with Dr. Holley Raring as in past.  3. Encouraged heart healthy diet and increasing exercise to 30 minutes most days of the week. 4. Check BP 1-2 x per week at home, keep log, and bring to clinic at next appointment. 5. Follow up 3-6 months.     Relevant Medications   lisinopril (ZESTRIL) 2.5 MG tablet   furosemide (LASIX) 20 MG tablet     Nervous and Auditory   Chronic low back pain (Primary Area of Pain) (Bilateral) (R>L) w/ sciatica (Bilateral) (Chronic) Stable with pain not worsening/not intensifying.  However, is unmanageable for patient due to chronicity and desire for relief.  Plan: 1. Manage anxiety 2. Return to pain management program.  Described need to adhere to regulations. 3. Follow-up prn.   Relevant Medications   hydrOXYzine (ATARAX/VISTARIL) 10 MG tablet   sertraline (ZOLOFT) 50 MG tablet    Other Visit Diagnoses    Anxiety    -  Primary Unstable.  Worsening and likely component of back pain.  START  hydroxyzine 10 mg tid prn for panic.  Start sertraline 50 mg once daily. PATIENT tolerated this in past.  Follow-up 2 months.   Relevant Medications   hydrOXYzine (ATARAX/VISTARIL) 10 MG tablet   sertraline (ZOLOFT) 50 MG tablet   CKD (chronic kidney disease), stage II     Stable.  Managed by Dr. Holley Raring [facility].  Follow-up prn and with Dr. Holley Raring as scheduled.   Relevant Medications   lisinopril (ZESTRIL) 2.5 MG tablet   Lower extremity edema     Stable.  Continue furosemide.  Prefer use of compression socks as  well.  Follow-up 3-6 months.   Relevant Medications   furosemide (LASIX) 20 MG tablet      Meds ordered this encounter  Medications  . hydrOXYzine (ATARAX/VISTARIL) 10 MG tablet    Sig: Take 1-2 tablets (10-20 mg total) by mouth 3 (three) times daily as needed for anxiety.    Dispense:  45 tablet    Refill:  0    Order Specific Question:   Supervising Provider    Answer:   Olin Hauser [[id number]]  . sertraline (ZOLOFT) 50 MG tablet    Sig: Take 1 tablet (50 mg total) by mouth daily.    Dispense:  30 tablet    Refill:  3    Order Specific Question:   Supervising Provider    Answer:   Olin Hauser [[id number]]  . lisinopril (ZESTRIL) 2.5 MG tablet    Sig: Take 1 tablet (2.5 mg total) by mouth daily.    Dispense:  30 tablet    Refill:  5    Order Specific Question:   Supervising Provider    Answer:   Olin Hauser [[id number]]  . furosemide (LASIX) 20 MG tablet    Sig: Take 1 tablet (20 mg total) by mouth daily as needed for edema.    Dispense:  30 tablet    Refill:  0    Order Specific Question:   Supervising Provider    Answer:   Olin Hauser [[id number]]    Follow up plan: Return in about 2 months (around 02/01/2019) for anxiety, hypertension.  A total of 43 minutes was spent face-to-face with this patient. Greater than 50% of this time was spent in counseling and coordination of care with the patient for pain management, anxiety as above.    Cassell Smiles, DNP, AGPCNP-BC Adult Gerontology Primary Care Nurse Practitioner [facility] 12/02/2018, 1:40 PM

## 2018-12-02 NOTE — Patient Instructions (Addendum)
Laurie Lin,   Thank you for coming in to clinic today.  1. START hydroxyzine 10 mg tablet.  Take 1-2 tablets (10-20 mg) up to three times daily as needed for anxiety or insomnia.  2. START sertraline (Zoloft) 50 mg once daily for anxiety.  This will take 6-8 weeks to reach full effect.  Use hydroxyzine in the meantime.  3. Continue on lisinopril and furosemide.  Please schedule a follow-up appointment with Cassell Smiles, AGNP. Return in about 2 months (around 02/01/2019).  If you have any other questions or concerns, please feel free to call the clinic or send a message through [facility]. You may also schedule an earlier appointment if necessary.  You will receive a survey after today's visit either digitally by e-mail or paper by [employer]. Your experiences and feedback matter to [place].  Please respond so we know how we are doing as we provide care for you.  Cassell Smiles, DNP, AGNP-BC Adult Gerontology Nurse Practitioner [facility], [facility]   Low Back Pain Exercises See other page with pictures of each exercise.  Start with 1 or 2 of these exercises that you are most comfortable with. Do not do any exercises that cause you significant worsening pain. Some of these may cause some "stretching soreness" but it should go away after you stop the exercise, and get better over time. Gradually increase up to 3-4 exercises as tolerated.  Standing hamstring stretch: Place the heel of your leg on a stool about 15 inches high. Keep your knee straight. Lean forward, bending at the hips until you feel a mild stretch in the back of your thigh. Make sure you do not roll your shoulders and bend at the waist when doing this or you will stretch your lower back instead. Hold the stretch for 15 to 30 seconds. Repeat 3 times. Repeat the same stretch on your other leg.  Cat and camel: Get down on your hands and knees. Let your stomach sag, allowing your back to curve downward. Hold this  position for 5 seconds. Then arch your back and hold for 5 seconds. Do 3 sets of 10.  Quadriped Arm/Leg Raises: Get down on your hands and knees. Tighten your abdominal muscles to stiffen your spine. While keeping your abdominals tight, raise one arm and the opposite leg away from you. Hold this position for 5 seconds. Lower your arm and leg slowly and alternate sides. Do this 10 times on each side.  Pelvic tilt: Lie on your back with your knees bent and your feet flat on the floor. Tighten your abdominal muscles and push your lower back into the floor. Hold this position for 5 seconds, then relax. Do 3 sets of 10.  Partial curl: Lie on your back with your knees bent and your feet flat on the floor. Tighten your stomach muscles and flatten your back against the floor. Tuck your chin to your chest. With your hands stretched out in front of you, curl your upper body forward until your shoulders clear the floor. Hold this position for 3 seconds. Don't hold your breath. It helps to breathe out as you lift your shoulders up. Relax. Repeat 10 times. Build to 3 sets of 10. To challenge yourself, clasp your hands behind your head and keep your elbows out to the side.  Lower trunk rotation: Lie on your back with your knees bent and your feet flat on the floor. Tighten your abdominal muscles and push your lower back into  the floor. Keeping your shoulders down flat, gently rotate your legs to one side, then the other as far as you can. Repeat 10 to 20 times.  Single knee to chest stretch: Lie on your back with your legs straight out in front of you. Bring one knee up to your chest and grasp the back of your thigh. Pull your knee toward your chest, stretching your buttock muscle. Hold this position for 15 to 30 seconds and return to the starting position. Repeat 3 times on each side.  Double knee to chest: Lie on your back with your knees bent and your feet flat on the floor. Tighten your abdominal muscles and push  your lower back into the floor. Pull both knees up to your chest. Hold for 5 seconds and repeat 10 to 20 times.

## 2018-12-05 ENCOUNTER — Encounter: Payer: Self-pay | Admitting: Nurse Practitioner

## 2018-12-11 ENCOUNTER — Other Ambulatory Visit: Payer: Self-pay | Admitting: Nurse Practitioner

## 2018-12-11 DIAGNOSIS — F419 Anxiety disorder, unspecified: Secondary | ICD-10-CM

## 2018-12-24 ENCOUNTER — Telehealth: Payer: Self-pay | Admitting: Nurse Practitioner

## 2018-12-24 NOTE — Telephone Encounter (Signed)
Patient advised as below. Patient reports she is heading to [facility] ER.

## 2018-12-24 NOTE — Telephone Encounter (Signed)
Pt  Called said that she have a burn on her foot and wanted to know if you would work her in before 3:40 appt. PT call back # is [phone]

## 2018-12-24 NOTE — Telephone Encounter (Signed)
No work-ins today.  Burns may need to be treated via urgent care or ER if severe.  Otherwise, do not apply any ointments, lotions, or oils to the skin.  Clean with water and using a gentle soap in shower is fine.  DO not soak in water.  Keep clean and cover with moist gauze (wound wash saline only) if needing a dressing.

## 2018-12-28 DIAGNOSIS — T25222A Burn of second degree of left foot, initial encounter: Secondary | ICD-10-CM | POA: Insufficient documentation

## 2019-02-01 ENCOUNTER — Ambulatory Visit: Payer: BC Managed Care – PPO | Admitting: Nurse Practitioner

## 2019-02-16 ENCOUNTER — Other Ambulatory Visit: Payer: Self-pay | Admitting: Family Medicine

## 2019-02-16 DIAGNOSIS — K219 Gastro-esophageal reflux disease without esophagitis: Secondary | ICD-10-CM

## 2019-02-16 DIAGNOSIS — K449 Diaphragmatic hernia without obstruction or gangrene: Secondary | ICD-10-CM

## 2019-03-29 LAB — BASIC METABOLIC PANEL
BUN: 12 (ref 4–21)
CO2: 27 — AB (ref 13–22)
Chloride: 101 (ref 99–108)
Creatinine: 0.9 (ref 0.5–1.1)
Glucose: 208
Potassium: 4.5 (ref 3.4–5.3)
Sodium: 142 (ref 137–147)

## 2019-03-29 LAB — COMPREHENSIVE METABOLIC PANEL
Calcium: 9.5 (ref 8.7–10.7)
GFR calc non Af Amer: 74

## 2019-03-29 LAB — HEPATIC FUNCTION PANEL
ALT: 39 — AB (ref 7–35)
AST: 23 (ref 13–35)
Alkaline Phosphatase: 129 — AB (ref 25–125)

## 2019-03-29 LAB — CBC AND DIFFERENTIAL
HCT: 32 — AB (ref 36–46)
Hemoglobin: 9.7 — AB (ref 12.0–16.0)
Platelets: 311 (ref 150–399)
WBC: 8.9

## 2019-05-22 ENCOUNTER — Other Ambulatory Visit: Payer: Self-pay | Admitting: Family Medicine

## 2019-05-22 DIAGNOSIS — K449 Diaphragmatic hernia without obstruction or gangrene: Secondary | ICD-10-CM

## 2019-05-22 DIAGNOSIS — K219 Gastro-esophageal reflux disease without esophagitis: Secondary | ICD-10-CM

## 2019-05-26 IMAGING — US US RENAL
1 series · 14 of 24 positions shown · non-contrast
Comparison: None.

CLINICAL DATA: Acute renal failure

EXAM:
RENAL / URINARY TRACT ULTRASOUND COMPLETE

[Series 1: us renal · 0.26mm/px · 14 of 24 slices shown]
[im 1/24]
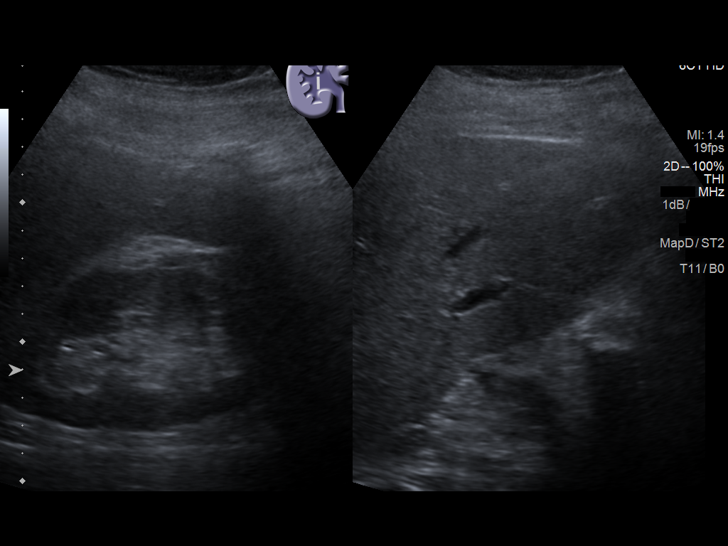
[im 3/24]
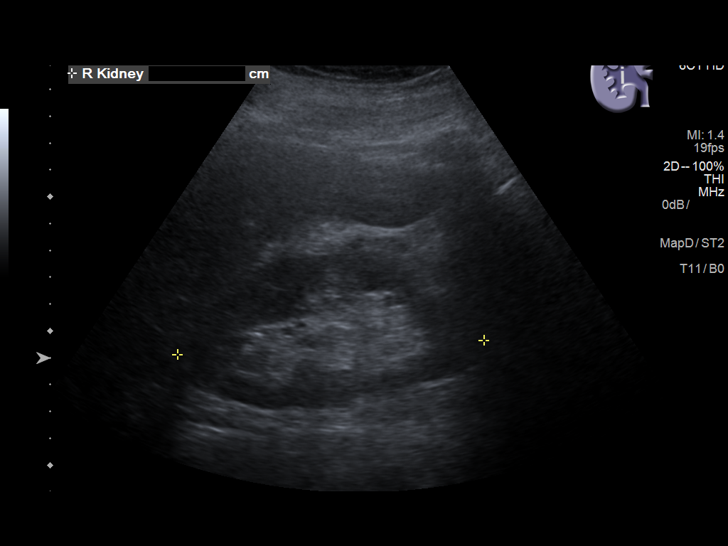
[im 5/24]
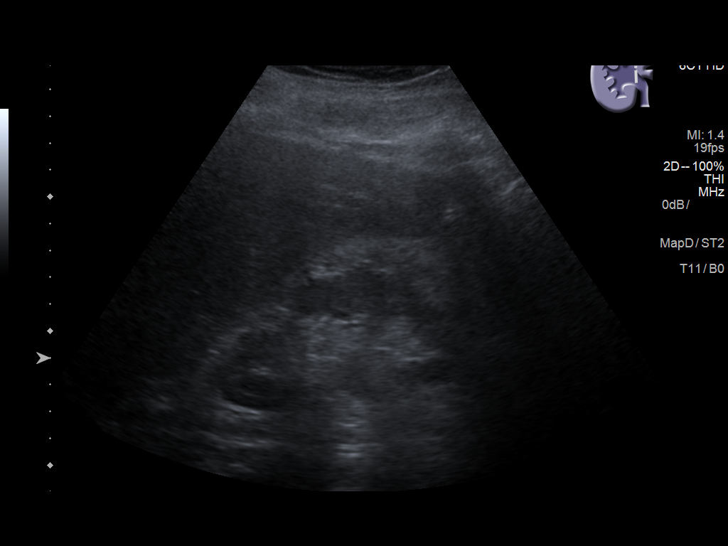
[im 7/24]
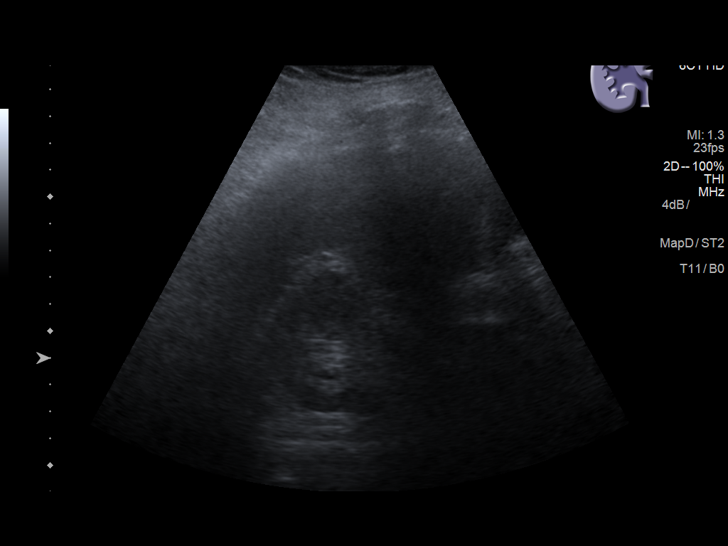
[im 8/24]
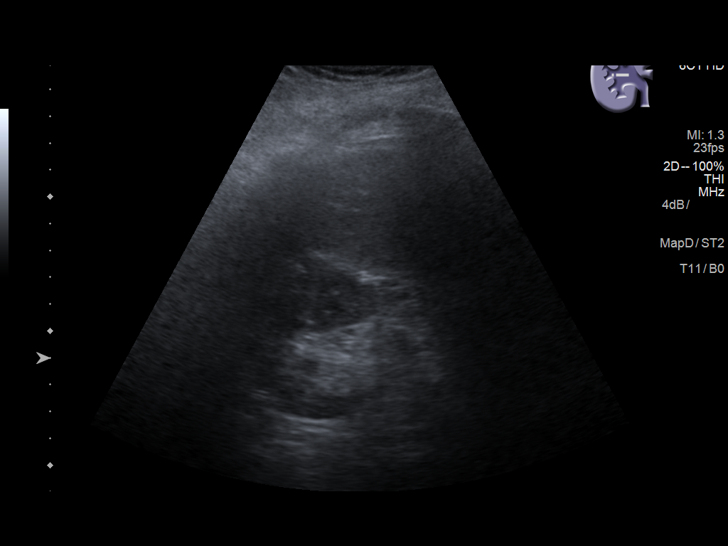
[im 10/24]
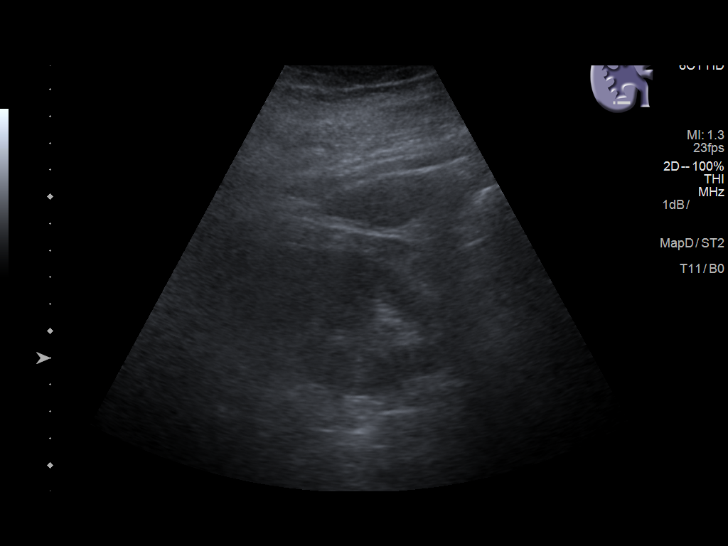
[im 12/24]
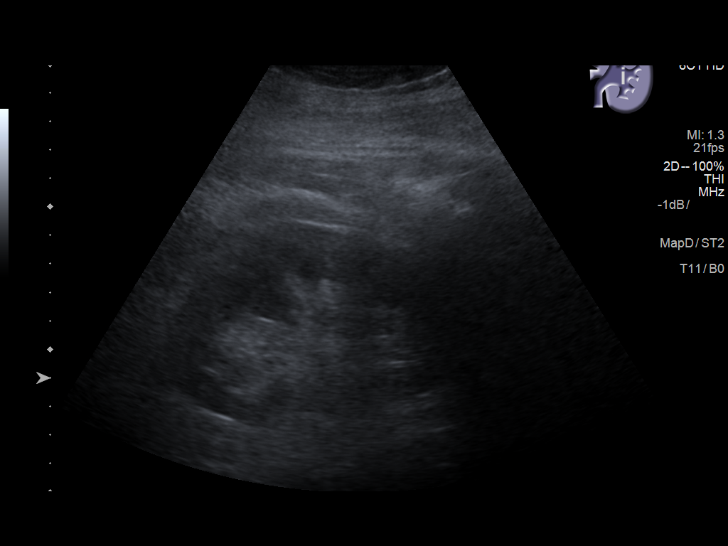
[im 13/24]
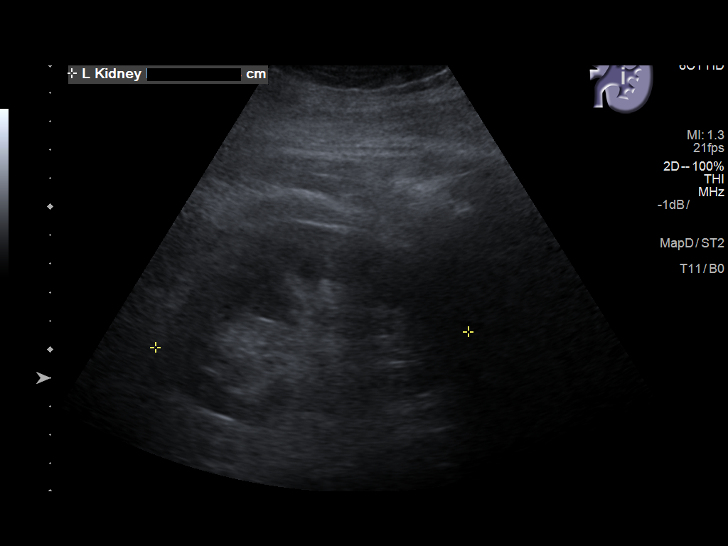
[im 15/24]
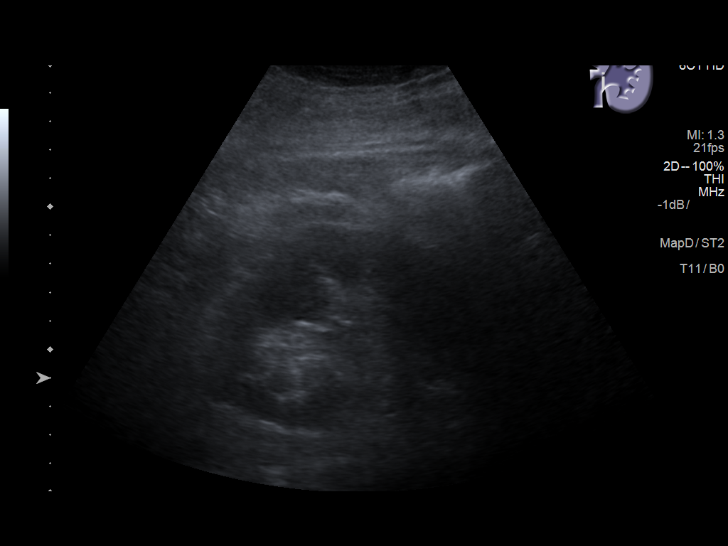
[im 17/24]
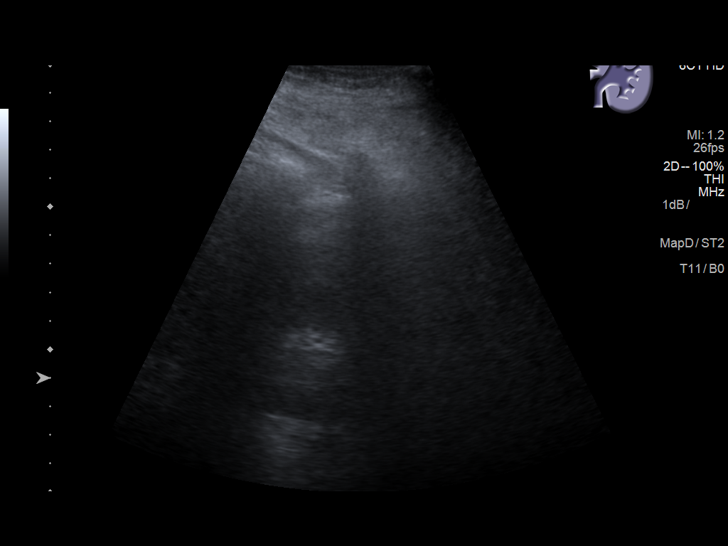
[im 19/24]
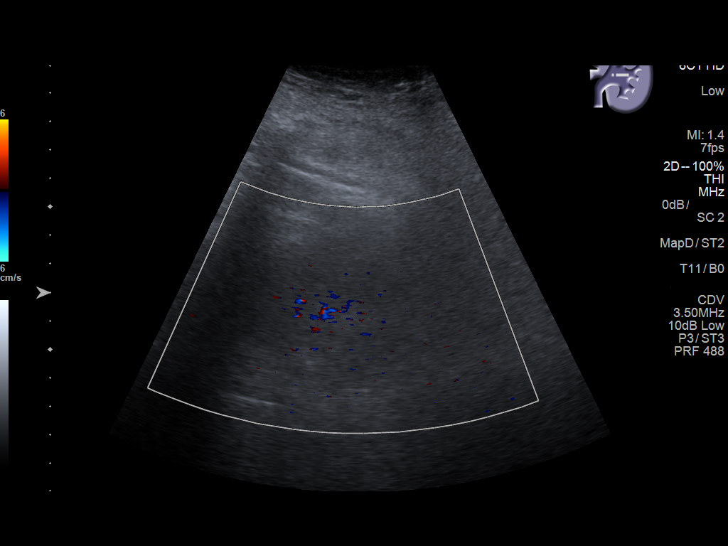
[im 20/24]
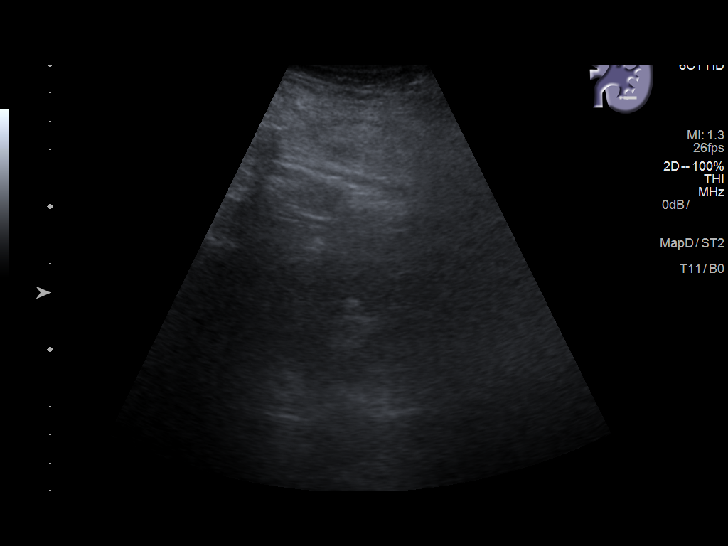
[im 22/24]
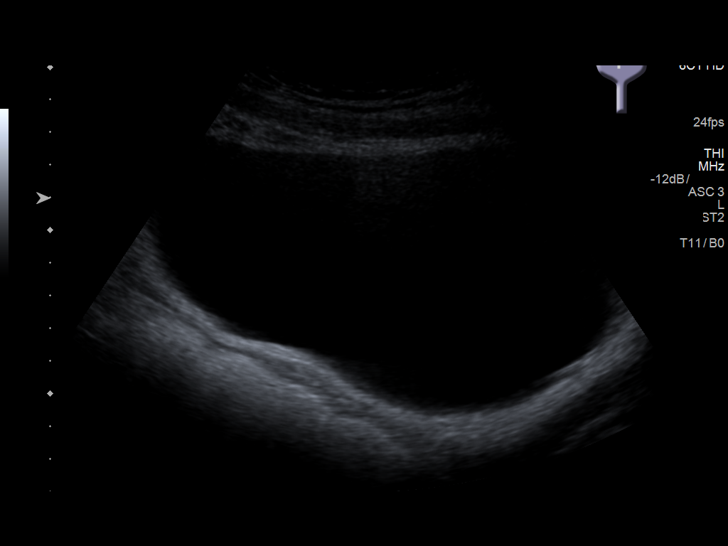
[im 24/24]
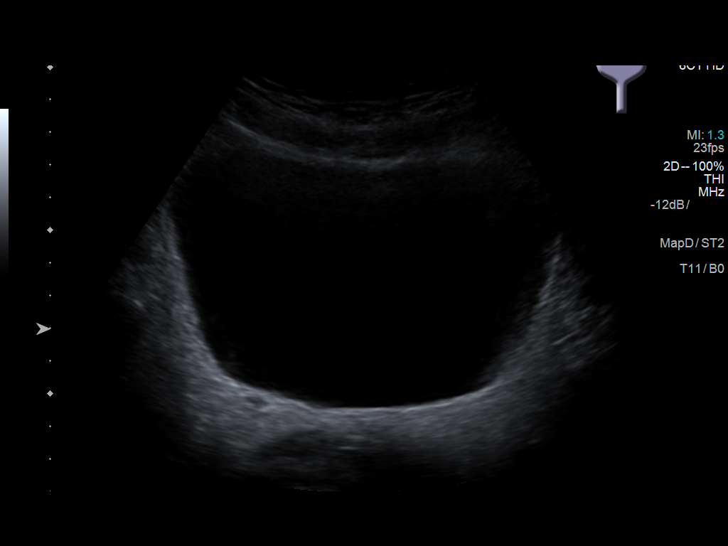

[14 of 24 positions shown; findings below may reference images not displayed]

FINDINGS: Right Kidney:

Length: 11.4 cm.. Echogenicity within normal limits. No mass or
hydronephrosis visualized.

Left Kidney:

Length: 11.0 cm.. Echogenicity within normal limits. No mass or
hydronephrosis visualized.

Bladder:

Appears normal for degree of bladder distention.
IMPRESSION: No acute abnormality noted in the kidneys.

## 2019-06-27 ENCOUNTER — Ambulatory Visit: Payer: BC Managed Care – PPO | Admitting: Family Medicine

## 2019-07-01 ENCOUNTER — Telehealth: Payer: Self-pay | Admitting: Nurse Practitioner

## 2019-07-01 NOTE — Telephone Encounter (Signed)
Pt called said that she tested  positive for Covid

## 2019-07-01 NOTE — Telephone Encounter (Signed)
The pt called to notify us that she was diagnose with Covid 19 x 2 days ago. The pt state that her symptoms are mild and actually improving. She was sick a week prior to getting tested for COVID  with fatigue, dry cough, and weakness.

## 2019-07-08 ENCOUNTER — Encounter: Payer: Self-pay | Admitting: Nurse Practitioner

## 2019-07-25 DIAGNOSIS — H1013 Acute atopic conjunctivitis, bilateral: Secondary | ICD-10-CM | POA: Diagnosis not present

## 2019-08-03 DIAGNOSIS — H1013 Acute atopic conjunctivitis, bilateral: Secondary | ICD-10-CM | POA: Diagnosis not present

## 2019-08-10 DIAGNOSIS — H1013 Acute atopic conjunctivitis, bilateral: Secondary | ICD-10-CM | POA: Diagnosis not present

## 2019-08-13 ENCOUNTER — Other Ambulatory Visit: Payer: Self-pay | Admitting: Family Medicine

## 2019-08-13 DIAGNOSIS — K219 Gastro-esophageal reflux disease without esophagitis: Secondary | ICD-10-CM

## 2019-08-13 DIAGNOSIS — K449 Diaphragmatic hernia without obstruction or gangrene: Secondary | ICD-10-CM

## 2019-08-13 NOTE — Telephone Encounter (Signed)
Requested Prescriptions  Pending Prescriptions Disp Refills  . esomeprazole (NEXIUM) 40 MG capsule [Pharmacy Med Name: ESOMEPRAZOLE MAG DR 40 MG CAP] 180 capsule 0    Sig: TAKE 1 CAPSULE (40 MG TOTAL) BY MOUTH 2 (TWO) TIMES DAILY BEFORE A MEAL.     Gastroenterology: Proton Pump Inhibitors Passed - 08/13/2019 11:27 AM      Passed - Valid encounter within last 12 months    Recent Outpatient Visits          8 months ago [facility] Mikey College, NP   11 months ago Gastroesophageal reflux disease, esophagitis presence not specified   [place], DO   2 years ago Acute pain of left knee   [facility] Mikey College, NP   2 years ago Type 1 diabetes mellitus with hyperglycemia [facility])   [facility] Mikey College, NP   2 years ago Atrial flutter, unspecified type [facility])   [facility] Merrilyn Puma, Jerrel Ivory, NP

## 2019-08-16 DIAGNOSIS — E113211 Type 2 diabetes mellitus with mild nonproliferative diabetic retinopathy with macular edema, right eye: Secondary | ICD-10-CM | POA: Diagnosis not present

## 2019-09-02 ENCOUNTER — Encounter: Payer: Self-pay | Admitting: Family Medicine

## 2019-09-02 DIAGNOSIS — L239 Allergic contact dermatitis, unspecified cause: Secondary | ICD-10-CM | POA: Diagnosis not present

## 2019-09-23 DIAGNOSIS — H40053 Ocular hypertension, bilateral: Secondary | ICD-10-CM | POA: Diagnosis not present

## 2019-10-10 DIAGNOSIS — E113211 Type 2 diabetes mellitus with mild nonproliferative diabetic retinopathy with macular edema, right eye: Secondary | ICD-10-CM | POA: Diagnosis not present

## 2019-10-28 ENCOUNTER — Ambulatory Visit
Admission: EM | Admit: 2019-10-28 | Discharge: 2019-10-28 | Disposition: A | Discharge status: Home / Self Care | Payer: Medicare Other | Attending: Family Medicine | Admitting: Family Medicine

## 2019-10-28 ENCOUNTER — Other Ambulatory Visit: Payer: Self-pay

## 2019-10-28 ENCOUNTER — Encounter: Payer: Self-pay | Admitting: Emergency Medicine

## 2019-10-28 DIAGNOSIS — M545 Low back pain, unspecified: Secondary | ICD-10-CM

## 2019-10-28 DIAGNOSIS — M62830 Muscle spasm of back: Secondary | ICD-10-CM | POA: Diagnosis not present

## 2019-10-28 DIAGNOSIS — M546 Pain in thoracic spine: Secondary | ICD-10-CM | POA: Diagnosis not present

## 2019-10-28 MED ORDER — HYDROCODONE-ACETAMINOPHEN 5-325 MG PO TABS: 1.0000 | ORAL_TABLET | Freq: Four times a day (QID) | ORAL | 0 refills | Status: DC | PRN

## 2019-10-28 MED ORDER — TIZANIDINE HCL 4 MG PO CAPS: 4.0000 mg | ORAL_CAPSULE | Freq: Three times a day (TID) | ORAL | 0 refills | Status: DC | PRN

## 2019-10-28 NOTE — ED Triage Notes (Signed)
Patient c/o mid and lower back pain that started on Monday.  Patient reports muscle spasms. Patient has only taken Tylenol for pain and has not helped.  Patient states that she can not take Ibuprofen.

## 2019-10-28 NOTE — Discharge Instructions (Addendum)
Recommend start Zanaflex muscle relaxer 1 tablet every 8 hours as needed for muscle spasms. May take Vicodin 1 tablet every 6 hours as needed for pain. May apply warm compresses to area for comfort. If pain continues or becomes worse, go to the ER ASAP for further evaluation. Otherwise, follow-up in 3 days with your PCP for recheck.

## 2019-10-29 ENCOUNTER — Telehealth: Payer: Self-pay | Admitting: Emergency Medicine

## 2019-10-29 MED ORDER — METHOCARBAMOL 500 MG PO TABS: 500.0000 mg | ORAL_TABLET | Freq: Two times a day (BID) | ORAL | 0 refills | Status: AC

## 2019-10-29 NOTE — ED Provider Notes (Signed)
[facility]    CSN: [id number] Arrival date & time: 10/28/19  1111      History   Chief Complaint Chief Complaint  Patient presents with  . Back Pain    HPI Laurie Lin is a 64 y.o. female.   64 year old female accompanied by her husband with concern over mid to lower bilateral back pain that started about 5 days ago. She was riding in a truck with her husband hauling heavy equipment on rough roads that seemed to "jar" her back. She experienced more pain when she got home and the pain and muscle spasms continue getting worse. She is nauseous and has a decreased appetite due to the pain but has not vomited. Denies any fever or abdominal pain. No radiation of pain down legs. She has tried Tylenol with minimal relief. Unable to take NSAIDs due to kidney disease and GI irritation. Also unable to take Steroids. Has history of chronic back pain but has not flared up for over 5 years. Had tried Flexeril in the past with no relief. Other chronic health issues include type 1 DM on insulin with stage 4 kidney disease, HTN, peripheral neuropathy, eye disorder and liver disease. Currently on Novolog insulin, Glargine insulin, Lisinopril, Nexium, and eye drops daily and Lasix and Tylenol prn.   The history is provided by the patient.    Past Medical History:  Diagnosis Date  . Diabetes mellitus without complication ([place])   . GERD (gastroesophageal reflux disease)   . Hypertension   . Renal disorder    stage 3     Patient Active Problem List   Diagnosis Date Noted  . Neurogenic pain 09/30/2017  . Anemia 09/30/2017  . Hyponatremia 09/30/2017  . Hypocalcemia 09/30/2017  . Hypoalbuminemia 09/30/2017  . Diabetes mellitus with stage 4 chronic kidney disease GFR 15-29 ([place]) 09/30/2017  . Diabetic peripheral neuropathy ([place]) 09/30/2017  . Chronic pain of hip (B) 09/30/2017  . Chronic sacroiliac joint pain (B) 09/30/2017  . Lumbar facet joint syndrome 09/30/2017  . H/O diabetic  foot ulcer 09/09/2017  . Chronic low back pain (Primary Area of Pain) (Bilateral) (R>L) w/ sciatica (Bilateral) 09/09/2017  . Chronic lower extremity pain (Secondary Area of Pain) (Bilateral) (R>L) 09/09/2017  . Chronic pain syndrome 09/09/2017  . Long term current use of opiate analgesic 09/09/2017  . Pharmacologic therapy 09/09/2017  . Disorder of skeletal system 09/09/2017  . Problems influencing health status 09/09/2017  . Other cirrhosis of liver ([place]) 06/03/2017  . Chronic lumbar radiculopathy 06/03/2017  . Chronic neck pain 06/03/2017  . Type 1 diabetes mellitus with hyperglycemia ([place]) 04/30/2017  . Atrial flutter ([place]) 04/30/2017  . Neuropathy 04/30/2017  . Hiatal hernia 04/30/2017  . Cataracts, bilateral 04/30/2017  . Albuminuria 04/30/2017  . DKA (diabetic ketoacidoses) ([place]) 03/21/2017  . Reactive depression 01/03/2015  . Essential hypertension 09/28/2014  . Hepatic steatosis 08/17/2013  . Elevated liver enzymes 08/17/2013    Past Surgical History:  Procedure Laterality Date  . DILATION AND CURETTAGE OF UTERUS     Miscarriage  . EYE SURGERY     bilateral caterac  . FINGER SURGERY    . KNEE ARTHROSCOPY Left 1989   [facility]  . PILONIDAL CYST DRAINAGE      OB History    Gravida  4   Para  3   Term      Preterm      AB  1   Living  3     SAB  1   TAB      Ectopic      Multiple      Live Births               Home Medications    Prior to Admission medications   Medication Sig Start Date End Date Taking? Authorizing Provider  acetaminophen (TYLENOL) 500 MG tablet Take 500 mg by mouth 2 (two) times daily as needed.    Yes [provider]  dorzolamide (TRUSOPT) 2 % ophthalmic solution TAKE 1 DROP(S) IN RIGHT EYE 3 TIMES A DAY 10/05/18  Yes [provider]  esomeprazole (NEXIUM) 40 MG capsule TAKE 1 CAPSULE (40 MG TOTAL) BY MOUTH 2 (TWO) TIMES DAILY BEFORE A MEAL. 08/13/19  Yes Malfi, Lupita Raider, FNP  furosemide (LASIX) 20 MG tablet  Take 1 tablet (20 mg total) by mouth daily as needed for edema. 12/02/18  Yes Mikey College, NP  insulin aspart (NOVOLOG) 100 UNIT/ML injection Inject 0-9 Units into the skin 3 (three) times daily with meals. BS 70-120 - 0 units       121-150 - 1 units       151-200 - 2 units        201-250 - 3 units        251-300 - 4 units        301-350 - 5 units        351-400 - 6 units        > 400 call md. 03/27/17  Yes Sainani, Belia Heman, MD  Insulin Glargine (BASAGLAR KWIKPEN) 100 UNIT/ML SOPN Inject 12 Units into the skin at bedtime.  07/01/18  Yes [provider]  lisinopril (ZESTRIL) 2.5 MG tablet Take 1 tablet (2.5 mg total) by mouth daily. 12/02/18  Yes Mikey College, NP  Multiple Vitamin (MULTIVITAMIN WITH MINERALS) TABS tablet Take 1 tablet by mouth daily. 03/28/17  Yes Sainani, Belia Heman, MD  BD ULTRA-FINE PEN NEEDLES 29G X 12.7MM MISC 1 EACH BY MISCELLANEOUS ROUTE FIVE (5) TIMES A DAY. 03/09/18   [provider]  Continuous Blood Gluc Sensor (DEXCOM G6 SENSOR) MISC by Does not apply route. 11/10/18   [provider]  HYDROcodone-acetaminophen (NORCO/VICODIN) 5-325 MG tablet Take 1 tablet by mouth every 6 (six) hours as needed for severe pain. 10/28/19   Katy Apo, NP  Insulin Pen Needle (FIFTY50 PEN NEEDLES) 32G X 4 MM MISC ok to sub any brand or size needle preferred by insurance/patient, use up to 5x/day, dx E10.65 03/02/18   [provider]  [facility] DELICA LANCETS 72Z MISC 1 Device by Does not apply route 4 (four) times daily. 05/20/17   Mikey College, NP  tiZANidine (ZANAFLEX) 4 MG capsule Take 1 capsule (4 mg total) by mouth 3 (three) times daily as needed for muscle spasms. 10/28/19   Katy Apo, NP  sertraline (ZOLOFT) 50 MG tablet TAKE 1 TABLET BY MOUTH EVERY DAY 12/13/18 10/28/19  Mikey College, NP    Family History Family History  Problem Relation Age of Onset  . Diabetes Mother     Social History Social History    Tobacco Use  . Smoking status: Never Smoker  . Smokeless tobacco: Never Used  Vaping Use  . Vaping Use: Never used  Substance Use Topics  . Alcohol use: Yes    Alcohol/week: 6.0 standard drinks    Types: 3 Cans of beer, 3 Shots of liquor per week    Comment: heavy drinker  .  Drug use: No     Allergies   Ibuprofen, Nsaids, and Latex   Review of Systems Review of Systems  Constitutional: Positive for activity change, appetite change and fatigue. Negative for chills and fever.  Eyes: Positive for visual disturbance.  Cardiovascular: Negative for chest pain.  Gastrointestinal: Positive for nausea. Negative for diarrhea and vomiting.  Genitourinary: Negative for decreased urine volume, difficulty urinating, dysuria, flank pain and hematuria.  Musculoskeletal: Positive for arthralgias, back pain, myalgias and neck pain.  Skin: Negative for color change, rash and wound.  Allergic/Immunologic: Negative for environmental allergies and food allergies.  Neurological: Positive for weakness, light-headedness and numbness. Negative for dizziness, seizures, syncope and facial asymmetry.  Hematological: Negative for adenopathy. Does not bruise/bleed easily.     Physical Exam Triage Vital Signs ED Triage Vitals  Enc Vitals Group     BP 10/28/19 1132 (!) 148/77     Pulse Rate 10/28/19 1132 92     Resp 10/28/19 1132 14     Temp 10/28/19 1132 98.3 F (36.8 C)     Temp Source 10/28/19 1132 Oral     SpO2 10/28/19 1132 100 %     Weight 10/28/19 1128 185 lb (83.9 kg)     Height 10/28/19 1128 5' 10.5" (1.791 m)     Head Circumference --      Peak Flow --      Pain Score 10/28/19 1128 5     Pain Loc --      Pain Edu? --      Excl. in [place]? --    No data found.  Updated Vital Signs BP (!) 148/77 (BP Location: Right Arm)   Pulse 92   Temp 98.3 F (36.8 C) (Oral)   Resp 14   Ht 5' 10.5" (1.791 m)   Wt 185 lb (83.9 kg)   LMP 09/09/2004 (Approximate)   SpO2 100%   BMI 26.17 kg/m    Visual Acuity Right Eye Distance:   Left Eye Distance:   Bilateral Distance:    Right Eye Near:   Left Eye Near:    Bilateral Near:     Physical Exam Vitals and nursing note reviewed.  Constitutional:      General: She is awake. She is not in acute distress.    Appearance: She is well-developed.     Comments: She is sitting in the exam chair in no acute distress but appears uncomfortable and continues to try to change positions due to pain.   HENT:     Head: Normocephalic and atraumatic.  Cardiovascular:     Rate and Rhythm: Normal rate and regular rhythm.     Heart sounds: Normal heart sounds. No murmur heard.   Pulmonary:     Effort: Pulmonary effort is normal. No respiratory distress.     Breath sounds: Normal breath sounds and air entry. No decreased air movement. No decreased breath sounds, wheezing, rhonchi or rales.  Musculoskeletal:        General: Tenderness present.     Cervical back: Normal range of motion.     Thoracic back: Tenderness present. No swelling, edema or spasms. Decreased range of motion.     Lumbar back: Spasms and tenderness present. No swelling or edema. Decreased range of motion. Positive right straight leg raise test and positive left straight leg raise test.       Back:     Right lower leg: No edema.     Left lower leg: No edema.  Comments: Decreased range of motion of back, especially with rotation. Tender bilaterally at lower thoracic region but especially in left lower lumbar region. Muscle spasms present on left lower lumbar area. No distinct redness, swelling or rash. No distinct neuro deficits noted.   Skin:    General: Skin is warm and dry.     Capillary Refill: Capillary refill takes less than 2 seconds.     Findings: No rash.  Neurological:     General: No focal deficit present.     Mental Status: She is alert and oriented to person, place, and time.     Sensory: Sensation is intact. No sensory deficit.     Gait: Gait is intact.      Deep Tendon Reflexes: Reflexes are normal and symmetric.  Psychiatric:        Attention and Perception: Attention normal.        Mood and Affect: Mood is anxious.        Speech: Speech normal.        Behavior: Behavior is cooperative.        Thought Content: Thought content normal.        Judgment: Judgment normal.      UC Treatments / Results  Labs (all labs ordered are listed, but only abnormal results are displayed) Labs Reviewed - No data to display  EKG   Radiology No results found.  Procedures Procedures (including critical care time)  Medications Ordered in UC Medications - No data to display  Initial Impression / Assessment and Plan / UC Course  I have reviewed the triage vital signs and the nursing notes.  Pertinent labs & imaging results that were available during my care of the patient were reviewed by me and considered in my medical decision making (see chart for details).    Reviewed with patient that I do not feel imaging is needed at this time. Due to muscle spasms, will trial muscle relaxer- Zanaflex 4mg  every 8 hours as needed- may cause drowsiness. Since unable to take NSAIDs or Prednisone, will trial Vicodin 5/325mg  1 tablet every 6 hours as needed for severe pain #16 with no refill. Encouraged to apply warm compresses to area for comfort. If pain continues at current level or worsens, go to the ER for further evaluation. Otherwise, follow-up in 3 days with her PCP for recheck.  Final Clinical Impressions(s) / UC Diagnoses   Final diagnoses:  Acute bilateral low back pain, unspecified whether sciatica present  Muscle spasm of back  Acute bilateral thoracic back pain     Discharge Instructions     Recommend start Zanaflex muscle relaxer 1 tablet every 8 hours as needed for muscle spasms. May take Vicodin 1 tablet every 6 hours as needed for pain. May apply warm compresses to area for comfort. If pain continues or becomes worse, go to the ER ASAP  for further evaluation. Otherwise, follow-up in 3 days with your PCP for recheck.     ED Prescriptions    Medication Sig Dispense Auth. Provider   tiZANidine (ZANAFLEX) 4 MG capsule Take 1 capsule (4 mg total) by mouth 3 (three) times daily as needed for muscle spasms. 20 capsule Katy Apo, NP   HYDROcodone-acetaminophen (NORCO/VICODIN) 5-325 MG tablet Take 1 tablet by mouth every 6 (six) hours as needed for severe pain. 16 tablet Lelynd Poer, Nicholes Stairs, NP     I have reviewed the PDMP during this encounter. Last current Active Rx was for Vicodin in Sept and Oct  2020 (10 tablets each). No other active Rx. I feel the benefits outweigh the risks for prescribing a controlled medication at this time.    Katy Apo, NP 10/29/19 [phone]

## 2019-10-29 NOTE — Telephone Encounter (Signed)
Patient called stating that the Tizanidine that was sent to her pharmacy yesterday, the pharmacist notified her that she could have an allergic reaction to this muscle relaxant because there was a similar ingredient in the muscle relaxer that she is allergic to in an eye drop.  Pharmacist recommended either Skelaxin or Robaxin.  Gertie Baron, NP was notified and wants Robaxin 500mg  1 tablet po bid as needed dispense 28 no refills

## 2019-11-03 DIAGNOSIS — N189 Chronic kidney disease, unspecified: Secondary | ICD-10-CM | POA: Insufficient documentation

## 2019-11-03 DIAGNOSIS — D638 Anemia in other chronic diseases classified elsewhere: Secondary | ICD-10-CM | POA: Insufficient documentation

## 2019-11-03 DIAGNOSIS — R600 Localized edema: Secondary | ICD-10-CM | POA: Diagnosis not present

## 2019-11-03 DIAGNOSIS — I10 Essential (primary) hypertension: Secondary | ICD-10-CM | POA: Diagnosis not present

## 2019-11-03 DIAGNOSIS — E1021 Type 1 diabetes mellitus with diabetic nephropathy: Secondary | ICD-10-CM | POA: Diagnosis not present

## 2019-11-03 DIAGNOSIS — D631 Anemia in chronic kidney disease: Secondary | ICD-10-CM | POA: Insufficient documentation

## 2019-11-03 DIAGNOSIS — D649 Anemia, unspecified: Secondary | ICD-10-CM | POA: Insufficient documentation

## 2019-11-03 DIAGNOSIS — N183 Chronic kidney disease, stage 3 unspecified: Secondary | ICD-10-CM | POA: Insufficient documentation

## 2019-11-03 DIAGNOSIS — N182 Chronic kidney disease, stage 2 (mild): Secondary | ICD-10-CM | POA: Diagnosis not present

## 2019-11-03 LAB — HEMOGLOBIN A1C: Hemoglobin A1C: 6.8

## 2019-11-15 ENCOUNTER — Encounter: Payer: Self-pay | Admitting: Family Medicine

## 2019-11-22 DIAGNOSIS — N189 Chronic kidney disease, unspecified: Secondary | ICD-10-CM | POA: Diagnosis not present

## 2019-11-22 DIAGNOSIS — E041 Nontoxic single thyroid nodule: Secondary | ICD-10-CM | POA: Diagnosis not present

## 2019-11-22 DIAGNOSIS — Z794 Long term (current) use of insulin: Secondary | ICD-10-CM | POA: Diagnosis not present

## 2019-11-22 DIAGNOSIS — E108 Type 1 diabetes mellitus with unspecified complications: Secondary | ICD-10-CM | POA: Diagnosis not present

## 2019-11-25 DIAGNOSIS — H40053 Ocular hypertension, bilateral: Secondary | ICD-10-CM | POA: Diagnosis not present

## 2019-12-02 DIAGNOSIS — E109 Type 1 diabetes mellitus without complications: Secondary | ICD-10-CM | POA: Diagnosis not present

## 2019-12-02 DIAGNOSIS — Z794 Long term (current) use of insulin: Secondary | ICD-10-CM | POA: Diagnosis not present

## 2019-12-05 DIAGNOSIS — E113292 Type 2 diabetes mellitus with mild nonproliferative diabetic retinopathy without macular edema, left eye: Secondary | ICD-10-CM | POA: Diagnosis not present

## 2020-01-17 DIAGNOSIS — M7062 Trochanteric bursitis, left hip: Secondary | ICD-10-CM | POA: Insufficient documentation

## 2020-01-17 DIAGNOSIS — M5416 Radiculopathy, lumbar region: Secondary | ICD-10-CM | POA: Diagnosis not present

## 2020-01-20 DIAGNOSIS — Z79891 Long term (current) use of opiate analgesic: Secondary | ICD-10-CM | POA: Diagnosis not present

## 2020-01-20 DIAGNOSIS — Z79899 Other long term (current) drug therapy: Secondary | ICD-10-CM | POA: Diagnosis not present

## 2020-01-20 DIAGNOSIS — Z5181 Encounter for therapeutic drug level monitoring: Secondary | ICD-10-CM | POA: Diagnosis not present

## 2020-02-10 DIAGNOSIS — M47896 Other spondylosis, lumbar region: Secondary | ICD-10-CM | POA: Diagnosis not present

## 2020-02-10 DIAGNOSIS — Z79899 Other long term (current) drug therapy: Secondary | ICD-10-CM | POA: Diagnosis not present

## 2020-02-10 DIAGNOSIS — G894 Chronic pain syndrome: Secondary | ICD-10-CM | POA: Diagnosis not present

## 2020-02-10 DIAGNOSIS — Z5181 Encounter for therapeutic drug level monitoring: Secondary | ICD-10-CM | POA: Diagnosis not present

## 2020-02-10 DIAGNOSIS — M5416 Radiculopathy, lumbar region: Secondary | ICD-10-CM | POA: Diagnosis not present

## 2020-02-13 DIAGNOSIS — E113211 Type 2 diabetes mellitus with mild nonproliferative diabetic retinopathy with macular edema, right eye: Secondary | ICD-10-CM | POA: Diagnosis not present

## 2020-02-28 DIAGNOSIS — E109 Type 1 diabetes mellitus without complications: Secondary | ICD-10-CM | POA: Diagnosis not present

## 2020-02-28 DIAGNOSIS — Z794 Long term (current) use of insulin: Secondary | ICD-10-CM | POA: Diagnosis not present

## 2020-03-02 DIAGNOSIS — G894 Chronic pain syndrome: Secondary | ICD-10-CM | POA: Diagnosis not present

## 2020-03-02 DIAGNOSIS — M47896 Other spondylosis, lumbar region: Secondary | ICD-10-CM | POA: Diagnosis not present

## 2020-03-02 DIAGNOSIS — Z79899 Other long term (current) drug therapy: Secondary | ICD-10-CM | POA: Diagnosis not present

## 2020-03-02 DIAGNOSIS — M5416 Radiculopathy, lumbar region: Secondary | ICD-10-CM | POA: Diagnosis not present

## 2020-03-08 DIAGNOSIS — R300 Dysuria: Secondary | ICD-10-CM | POA: Diagnosis not present

## 2020-03-20 DIAGNOSIS — N390 Urinary tract infection, site not specified: Secondary | ICD-10-CM | POA: Diagnosis not present

## 2020-03-30 DIAGNOSIS — H40053 Ocular hypertension, bilateral: Secondary | ICD-10-CM | POA: Diagnosis not present

## 2020-04-09 DIAGNOSIS — H2000 Unspecified acute and subacute iridocyclitis: Secondary | ICD-10-CM | POA: Diagnosis not present

## 2020-04-16 DIAGNOSIS — H2000 Unspecified acute and subacute iridocyclitis: Secondary | ICD-10-CM | POA: Diagnosis not present

## 2020-04-16 DIAGNOSIS — E113292 Type 2 diabetes mellitus with mild nonproliferative diabetic retinopathy without macular edema, left eye: Secondary | ICD-10-CM | POA: Diagnosis not present

## 2020-04-25 DIAGNOSIS — H40053 Ocular hypertension, bilateral: Secondary | ICD-10-CM | POA: Diagnosis not present

## 2020-05-15 DIAGNOSIS — E113211 Type 2 diabetes mellitus with mild nonproliferative diabetic retinopathy with macular edema, right eye: Secondary | ICD-10-CM | POA: Diagnosis not present

## 2020-05-17 DIAGNOSIS — E109 Type 1 diabetes mellitus without complications: Secondary | ICD-10-CM | POA: Diagnosis not present

## 2020-05-29 DIAGNOSIS — E109 Type 1 diabetes mellitus without complications: Secondary | ICD-10-CM | POA: Diagnosis not present

## 2020-05-29 DIAGNOSIS — Z794 Long term (current) use of insulin: Secondary | ICD-10-CM | POA: Diagnosis not present

## 2020-05-31 DIAGNOSIS — R600 Localized edema: Secondary | ICD-10-CM | POA: Diagnosis not present

## 2020-05-31 DIAGNOSIS — E1021 Type 1 diabetes mellitus with diabetic nephropathy: Secondary | ICD-10-CM | POA: Diagnosis not present

## 2020-05-31 DIAGNOSIS — R8290 Unspecified abnormal findings in urine: Secondary | ICD-10-CM | POA: Diagnosis not present

## 2020-05-31 DIAGNOSIS — N1832 Chronic kidney disease, stage 3b: Secondary | ICD-10-CM | POA: Diagnosis not present

## 2020-05-31 DIAGNOSIS — I10 Essential (primary) hypertension: Secondary | ICD-10-CM | POA: Diagnosis not present

## 2020-08-22 ENCOUNTER — Encounter: Payer: Self-pay | Admitting: Urology

## 2020-08-22 ENCOUNTER — Other Ambulatory Visit: Payer: Self-pay

## 2020-08-22 ENCOUNTER — Ambulatory Visit: Payer: Medicare Other | Admitting: Urology

## 2020-08-22 VITALS — BP 172/92 | HR 92 | Ht 70.5 in | Wt 204.0 lb

## 2020-08-22 DIAGNOSIS — N390 Urinary tract infection, site not specified: Secondary | ICD-10-CM | POA: Diagnosis not present

## 2020-08-22 DIAGNOSIS — N184 Chronic kidney disease, stage 4 (severe): Secondary | ICD-10-CM

## 2020-08-22 LAB — BLADDER SCAN AMB NON-IMAGING

## 2020-08-22 MED ORDER — ESTRADIOL 0.1 MG/GM VA CREA: TOPICAL_CREAM | VAGINAL | 1 refills | Status: DC

## 2020-08-22 NOTE — Addendum Note (Signed)
Addended by: Despina Hidden on: 08/22/2020 03:11 PM   Modules accepted: Orders

## 2020-08-22 NOTE — Patient Instructions (Signed)
Obstetrics: Normal and problem pregnancies (7th ed., pp. [phone]). [place], [place]: Elsevier."> [facility] (12th ed., pp. [phone]). [place], [place]: Elsevier.">  Asymptomatic Bacteriuria Asymptomatic bacteriuria is the presence of a large number of bacteria in the urine without the usual symptoms of burning or frequent urination. This is usually not harmful, and treatment may not be needed. A person with this condition will not be more likely to develop an infection in the future. What are the causes? This condition is caused by an increase in bacteria in the urine. This increase can be caused by:  Bacteria entering the urinary tract, such as during sex.  A blockage in the urinary tract, such as from kidney stones or a tumor.  Bladder problems that prevent the bladder from emptying.   What increases the risk? You are more likely to develop this condition if:  You have diabetes.  You are an older adult. This especially affects older adults in long-term care facilities.  You are pregnant and in the first trimester.  You have kidney stones.  You are female.  You have had a kidney transplant.  You have a leaky kidney tube valve (reflux).  You had a urinary catheter for a long period of time. This is a long, thin tube that collects urine. What are the signs or symptoms? There are no symptoms of this condition. How is this diagnosed? This condition is diagnosed with a urine test. Because this condition does not cause symptoms, it is usually diagnosed when a urine sample is taken to treat or diagnose another condition, such as pregnancy or kidney problems. Most women who are in their first trimester of pregnancy are screened for asymptomatic bacteriuria. How is this treated? Usually, treatment is not needed for this condition. Treating the condition can lead to other problems, such as a yeast infection or the growth of bacteria that do not respond to treatment  (antibiotic-resistant bacteria). Some people do need treatment with antibiotic medicines to prevent kidney infection, known as pyelonephritis. Treatment is needed if:  You are pregnant. In pregnant women, kidney infection can lead to: ? Early labor (premature labor). ? Very low birth weight (fetal growth restriction). ? Newborn death.  You are having a procedure that affects the urinary tract.  You have had a kidney transplant. If you are diagnosed with this condition, talk with your health care provider about any concerns that you have. Follow these instructions at home: Medicines  Take over-the-counter and prescription medicines only as told by your health care provider.  If you were prescribed an antibiotic medicine, take it as told by your health care provider. Do not stop using the antibiotic even if you start to feel better. General instructions  Monitor your condition for any changes.  Drink enough fluid to keep your urine pale yellow.  Urinate more often to keep your bladder empty.  If you are female, keep the area around your vagina and rectum clean. Wipe from front to back after urinating or having a bowel movement. Use each piece of toilet paper only once.  Keep all follow-up visits. This is important. Contact a health care provider if:  You have symptoms of a urine infection, such as: ? A burning sensation, or pain when you urinate. ? A strong need to urinate, or urinating more often. ? Urine turning discolored or cloudy. ? Blood in your urine. ? Urine that smells bad. Get help right away if:  You develop signs of a kidney infection, such as: ? Back  pain or pelvic pain. ? A fever or chills. ? Nausea or vomiting. ? Severe pain that cannot be controlled with medicine. Summary  Asymptomatic bacteriuria is the presence of a large number of bacteria in the urine without the usual symptoms of burning or frequent urination.  Usually, treatment is not needed for  this condition. Treating the condition can lead to other problems, such as a yeast infection or the growth of bacteria that do not respond to treatment.  Some people do need treatment. Treatment is needed if you are pregnant, if you are having a procedure that affects the urinary tract, or if you have had a kidney transplant.  If you were prescribed an antibiotic medicine, take it as told by your health care provider. Do not stop using the antibiotic even if you start to feel better. This information is not intended to replace advice given to you by your health care provider. Make sure you discuss any questions you have with your health care provider. Document Revised: 11/18/2019 Document Reviewed: 11/18/2019 Elsevier Patient Education  2021 [place].   Urinary Tract Infection, Adult  A urinary tract infection (UTI) is an infection of any part of the urinary tract. The urinary tract includes the kidneys, ureters, bladder, and urethra. These organs make, store, and get rid of urine in the body. An upper UTI affects the ureters and kidneys. A lower UTI affects the bladder and urethra. What are the causes? Most urinary tract infections are caused by bacteria in your genital area around your urethra, where urine leaves your body. These bacteria grow and cause inflammation of your urinary tract. What increases the risk? You are more likely to develop this condition if:  You have a urinary catheter that stays in place.  You are not able to control when you urinate or have a bowel movement (incontinence).  You are female and you: ? Use a spermicide or diaphragm for birth control. ? Have low estrogen levels. ? Are pregnant.  You have certain genes that increase your risk.  You are sexually active.  You take antibiotic medicines.  You have a condition that causes your flow of urine to slow down, such as: ? An enlarged prostate, if you are female. ? Blockage in your urethra. ? A kidney  stone. ? A nerve condition that affects your bladder control (neurogenic bladder). ? Not getting enough to drink, or not urinating often.  You have certain medical conditions, such as: ? Diabetes. ? A weak disease-fighting system (immunesystem). ? Sickle cell disease. ? Gout. ? Spinal cord injury. What are the signs or symptoms? Symptoms of this condition include:  Needing to urinate right away (urgency).  Frequent urination. This may include small amounts of urine each time you urinate.  Pain or burning with urination.  Blood in the urine.  Urine that smells bad or unusual.  Trouble urinating.  Cloudy urine.  Vaginal discharge, if you are female.  Pain in the abdomen or the lower back. You may also have:  Vomiting or a decreased appetite.  Confusion.  Irritability or tiredness.  A fever or chills.  Diarrhea. The first symptom in older adults may be confusion. In some cases, they may not have any symptoms until the infection has worsened. How is this diagnosed? This condition is diagnosed based on your medical history and a physical exam. You may also have other tests, including:  Urine tests.  Blood tests.  Tests for STIs (sexually transmitted infections). If you have had  more than one UTI, a cystoscopy or imaging studies may be done to determine the cause of the infections. How is this treated? Treatment for this condition includes:  Antibiotic medicine.  Over-the-counter medicines to treat discomfort.  Drinking enough water to stay hydrated. If you have frequent infections or have other conditions such as a kidney stone, you may need to see a health care provider who specializes in the urinary tract (urologist). In rare cases, urinary tract infections can cause sepsis. Sepsis is a life-threatening condition that occurs when the body responds to an infection. Sepsis is treated in the hospital with IV antibiotics, fluids, and other medicines. Follow these  instructions at home: Medicines  Take over-the-counter and prescription medicines only as told by your health care provider.  If you were prescribed an antibiotic medicine, take it as told by your health care provider. Do not stop using the antibiotic even if you start to feel better. General instructions  Make sure you: ? Empty your bladder often and completely. Do not hold urine for long periods of time. ? Empty your bladder after sex. ? Wipe from front to back after urinating or having a bowel movement if you are female. Use each tissue only one time when you wipe.  Drink enough fluid to keep your urine pale yellow.  Keep all follow-up visits. This is important.   Contact a health care provider if:  Your symptoms do not get better after 1-2 days.  Your symptoms go away and then return. Get help right away if:  You have severe pain in your back or your lower abdomen.  You have a fever or chills.  You have nausea or vomiting. Summary  A urinary tract infection (UTI) is an infection of any part of the urinary tract, which includes the kidneys, ureters, bladder, and urethra.  Most urinary tract infections are caused by bacteria in your genital area.  Treatment for this condition often includes antibiotic medicines.  If you were prescribed an antibiotic medicine, take it as told by your health care provider. Do not stop using the antibiotic even if you start to feel better.  Keep all follow-up visits. This is important. This information is not intended to replace advice given to you by your health care provider. Make sure you discuss any questions you have with your health care provider. Document Revised: 11/18/2019 Document Reviewed: 11/18/2019 Elsevier Patient Education  [place].

## 2020-08-22 NOTE — Progress Notes (Signed)
   08/22/20 1:08 PM   Laurie Lin 1955/11/05 [id number]  CC: Recurrent UTIs  HPI: I saw Laurie Lin and her husband today for evaluation of recurrent UTIs.  She is a 65 year old female with stage IV CKD and type 1 diabetes who reports multiple UTIs over the last 6 months.  These have been culture documented in care everywhere with > 100K Klebsiella.  She has been treated with antibiotics twice for 5 days and 7 days with initial improvement in her urinary symptoms, but recurrence within a few days.  Her primary symptoms are pelvic pressure and "pulling "in the pelvis.  She denies any significant dysuria or urgency/frequency.  She denies any fevers or flank pain.  Most recent imaging was a renal ultrasound in December 2018 that showed no hydronephrosis or other abnormalities.  She drinks cranberry juice regularly.  She denies problems with constipation.     PMH: Past Medical History:  Diagnosis Date  . Diabetes mellitus without complication ([place])   . GERD (gastroesophageal reflux disease)   . Hypertension   . Renal disorder    stage 3     Surgical History: Past Surgical History:  Procedure Laterality Date  . DILATION AND CURETTAGE OF UTERUS     Miscarriage  . EYE SURGERY     bilateral caterac  . FINGER SURGERY    . KNEE ARTHROSCOPY Left 1989   [facility]  . PILONIDAL CYST DRAINAGE     Social History:  reports that she has never smoked. She has never used smokeless tobacco. She reports current alcohol use of about 6.0 standard drinks of alcohol per week. She reports that she does not use drugs.  Physical Exam: BP (!) 172/92   Pulse 92   Ht 5' 10.5" (1.791 m)   Wt 204 lb (92.5 kg)   LMP 09/09/2004 (Approximate)   BMI 28.86 kg/m    Constitutional:  Alert and oriented, No acute distress. Cardiovascular: No clubbing, cyanosis, or edema. Respiratory: Normal respiratory effort, no increased work of breathing. GI: Abdomen is soft, nontender, nondistended, no abdominal  masses  Laboratory Data: Urinalysis today pending, outside labs reviewed  Pertinent Imaging: I have personally viewed and interpreted the renal ultrasound from December 2018 that shows no hydronephrosis or obvious stones  Assessment & Plan:   65 year old female with diabetes and CKD who presents with recurrent Klebsiella UTIs over the last 6 months.  We discussed the evaluation and treatment of patients with recurrent UTIs at length.  We specifically discussed the differences between asymptomatic bacteriuria and true urinary tract infection.  We discussed the AUA definition of recurrent UTI of at least 2 culture proven symptomatic acute cystitis episodes in a 66-monthperiod, or 3 within a 1 year period.  We discussed the importance of culture directed antibiotic treatment, and antibiotic stewardship.  First-line therapy includes nitrofurantoin(5 days), Bactrim(3 days), or fosfomycin(3 g single dose).  Possible etiologies of recurrent infection include periurethral tissue atrophy in postmenopausal woman, constipation, sexual activity, incomplete emptying, anatomic abnormalities, and even genetic predisposition.  Finally, we discussed the role of perineal hygiene, timed voiding, adequate hydration, topical vaginal estrogen, cranberry prophylaxis, and low-dose antibiotic prophylaxis.  Renal/bladder ultrasound, call with results Trial of topical estrogen cream Continue cranberry tablets for prophylaxis  BNickolas Madrid MD 08/22/2020  [facility] [address] [place] [phone]

## 2020-08-23 LAB — URINALYSIS, COMPLETE
Bilirubin, UA: NEGATIVE
Ketones, UA: NEGATIVE
Nitrite, UA: POSITIVE — AB
Specific Gravity, UA: 1.005 (ref 1.005–1.030)
Urobilinogen, Ur: 0.2 mg/dL (ref 0.2–1.0)
pH, UA: 5.5 (ref 5.0–7.5)

## 2020-08-23 LAB — MICROSCOPIC EXAMINATION: WBC, UA: >30 /hpf — AB (ref 0–5)

## 2020-08-27 LAB — CULTURE, URINE COMPREHENSIVE

## 2020-08-28 LAB — MYCOPLASMA / UREAPLASMA CULTURE
Mycoplasma hominis Culture: NEGATIVE
Ureaplasma urealyticum: NEGATIVE

## 2020-08-29 ENCOUNTER — Telehealth: Payer: Self-pay

## 2020-08-29 MED ORDER — SULFAMETHOXAZOLE-TRIMETHOPRIM 800-160 MG PO TABS: 1.0000 | ORAL_TABLET | Freq: Two times a day (BID) | ORAL | 0 refills | Status: DC

## 2020-08-29 NOTE — Telephone Encounter (Signed)
-----   Message from Billey Co, MD sent at 08/29/2020 10:35 AM EDT ----- Urine culture did grow bacteria.  Recommend Bactrim DS twice daily x5days, and follow-up for renal ultrasound as discussed  BS

## 2020-08-29 NOTE — Telephone Encounter (Signed)
Patient notified and script sent into pharmacy 

## 2020-09-04 ENCOUNTER — Telehealth: Payer: Self-pay | Admitting: *Deleted

## 2020-09-04 NOTE — Telephone Encounter (Signed)
Patient called in this afternoon and states she started feeling nauseous yesterday morning  and this morning. She states in the afternoon is fine. She only has one day left of the bactrim. No fever or chili's. No burning with urination . Please advise.

## 2020-09-05 NOTE — Telephone Encounter (Signed)
Called pt no answer. LM for pt to call back with an update on how she is feeling.

## 2020-09-05 NOTE — Telephone Encounter (Signed)
Finish Bactrim, it can sometimes cause upset stomach, thanks  Nickolas Madrid, MD 09/05/2020

## 2020-10-23 ENCOUNTER — Other Ambulatory Visit: Payer: Self-pay

## 2020-10-23 ENCOUNTER — Ambulatory Visit
Admission: RE | Admit: 2020-10-23 | Discharge: 2020-10-23 | Disposition: A | Discharge status: Home / Self Care | Payer: Medicare Other | Source: Ambulatory Visit | Attending: Urology | Admitting: Urology

## 2020-10-23 DIAGNOSIS — N390 Urinary tract infection, site not specified: Secondary | ICD-10-CM | POA: Insufficient documentation

## 2022-04-17 ENCOUNTER — Inpatient Hospital Stay
Admission: EM | Admit: 2022-04-17 | Discharge: 2022-05-06 | DRG: 640 | Disposition: A | Discharge status: Skilled Nursing Facility | Payer: Medicare Other | Attending: Internal Medicine | Admitting: Internal Medicine

## 2022-04-17 ENCOUNTER — Other Ambulatory Visit: Payer: Self-pay

## 2022-04-17 ENCOUNTER — Emergency Department: Payer: Medicare Other

## 2022-04-17 DIAGNOSIS — F1010 Alcohol abuse, uncomplicated: Secondary | ICD-10-CM | POA: Diagnosis not present

## 2022-04-17 DIAGNOSIS — N179 Acute kidney failure, unspecified: Secondary | ICD-10-CM | POA: Diagnosis present

## 2022-04-17 DIAGNOSIS — K921 Melena: Secondary | ICD-10-CM | POA: Diagnosis not present

## 2022-04-17 DIAGNOSIS — W19XXXA Unspecified fall, initial encounter: Secondary | ICD-10-CM | POA: Diagnosis present

## 2022-04-17 DIAGNOSIS — B961 Klebsiella pneumoniae [K. pneumoniae] as the cause of diseases classified elsewhere: Secondary | ICD-10-CM | POA: Diagnosis present

## 2022-04-17 DIAGNOSIS — I10 Essential (primary) hypertension: Secondary | ICD-10-CM | POA: Diagnosis present

## 2022-04-17 DIAGNOSIS — E10649 Type 1 diabetes mellitus with hypoglycemia without coma: Secondary | ICD-10-CM | POA: Diagnosis not present

## 2022-04-17 DIAGNOSIS — E860 Dehydration: Secondary | ICD-10-CM | POA: Diagnosis present

## 2022-04-17 DIAGNOSIS — R680 Hypothermia, not associated with low environmental temperature: Secondary | ICD-10-CM | POA: Diagnosis not present

## 2022-04-17 DIAGNOSIS — E875 Hyperkalemia: Secondary | ICD-10-CM | POA: Diagnosis present

## 2022-04-17 DIAGNOSIS — I129 Hypertensive chronic kidney disease with stage 1 through stage 4 chronic kidney disease, or unspecified chronic kidney disease: Secondary | ICD-10-CM | POA: Diagnosis present

## 2022-04-17 DIAGNOSIS — E1022 Type 1 diabetes mellitus with diabetic chronic kidney disease: Secondary | ICD-10-CM | POA: Diagnosis present

## 2022-04-17 DIAGNOSIS — G9340 Encephalopathy, unspecified: Secondary | ICD-10-CM | POA: Diagnosis present

## 2022-04-17 DIAGNOSIS — J690 Pneumonitis due to inhalation of food and vomit: Secondary | ICD-10-CM | POA: Diagnosis not present

## 2022-04-17 DIAGNOSIS — N1831 Chronic kidney disease, stage 3a: Secondary | ICD-10-CM | POA: Diagnosis present

## 2022-04-17 DIAGNOSIS — J9601 Acute respiratory failure with hypoxia: Secondary | ICD-10-CM | POA: Diagnosis not present

## 2022-04-17 DIAGNOSIS — E870 Hyperosmolality and hypernatremia: Secondary | ICD-10-CM | POA: Diagnosis present

## 2022-04-17 DIAGNOSIS — L299 Pruritus, unspecified: Secondary | ICD-10-CM | POA: Diagnosis not present

## 2022-04-17 DIAGNOSIS — E1065 Type 1 diabetes mellitus with hyperglycemia: Secondary | ICD-10-CM | POA: Diagnosis present

## 2022-04-17 DIAGNOSIS — Z79890 Hormone replacement therapy (postmenopausal): Secondary | ICD-10-CM

## 2022-04-17 DIAGNOSIS — N390 Urinary tract infection, site not specified: Secondary | ICD-10-CM | POA: Diagnosis present

## 2022-04-17 DIAGNOSIS — R471 Dysarthria and anarthria: Secondary | ICD-10-CM | POA: Diagnosis not present

## 2022-04-17 DIAGNOSIS — F1026 Alcohol dependence with alcohol-induced persisting amnestic disorder: Secondary | ICD-10-CM | POA: Diagnosis present

## 2022-04-17 DIAGNOSIS — J40 Bronchitis, not specified as acute or chronic: Secondary | ICD-10-CM | POA: Diagnosis present

## 2022-04-17 DIAGNOSIS — E538 Deficiency of other specified B group vitamins: Secondary | ICD-10-CM | POA: Diagnosis present

## 2022-04-17 DIAGNOSIS — E871 Hypo-osmolality and hyponatremia: Secondary | ICD-10-CM | POA: Diagnosis not present

## 2022-04-17 DIAGNOSIS — R739 Hyperglycemia, unspecified: Secondary | ICD-10-CM

## 2022-04-17 DIAGNOSIS — R4182 Altered mental status, unspecified: Principal | ICD-10-CM

## 2022-04-17 DIAGNOSIS — F10231 Alcohol dependence with withdrawal delirium: Secondary | ICD-10-CM | POA: Diagnosis not present

## 2022-04-17 DIAGNOSIS — E663 Overweight: Secondary | ICD-10-CM | POA: Diagnosis present

## 2022-04-17 DIAGNOSIS — E512 Wernicke's encephalopathy: Secondary | ICD-10-CM | POA: Diagnosis not present

## 2022-04-17 DIAGNOSIS — Z6829 Body mass index (BMI) 29.0-29.9, adult: Secondary | ICD-10-CM

## 2022-04-17 DIAGNOSIS — Z1152 Encounter for screening for COVID-19: Secondary | ICD-10-CM

## 2022-04-17 DIAGNOSIS — K219 Gastro-esophageal reflux disease without esophagitis: Secondary | ICD-10-CM | POA: Diagnosis present

## 2022-04-17 DIAGNOSIS — Z91040 Latex allergy status: Secondary | ICD-10-CM

## 2022-04-17 DIAGNOSIS — G9341 Metabolic encephalopathy: Secondary | ICD-10-CM | POA: Diagnosis present

## 2022-04-17 DIAGNOSIS — Z833 Family history of diabetes mellitus: Secondary | ICD-10-CM

## 2022-04-17 DIAGNOSIS — D638 Anemia in other chronic diseases classified elsewhere: Secondary | ICD-10-CM | POA: Diagnosis present

## 2022-04-17 DIAGNOSIS — E872 Acidosis, unspecified: Secondary | ICD-10-CM | POA: Diagnosis present

## 2022-04-17 DIAGNOSIS — Z79899 Other long term (current) drug therapy: Secondary | ICD-10-CM

## 2022-04-17 DIAGNOSIS — Z794 Long term (current) use of insulin: Secondary | ICD-10-CM

## 2022-04-17 DIAGNOSIS — Z886 Allergy status to analgesic agent status: Secondary | ICD-10-CM

## 2022-04-17 LAB — COMPREHENSIVE METABOLIC PANEL
ALT: 13 U/L (ref 0–44)
AST: 29 U/L (ref 15–41)
Albumin: 3.8 g/dL (ref 3.5–5.0)
Alkaline Phosphatase: 52 U/L (ref 38–126)
Anion gap: 15 (ref 5–15)
BUN: 26 mg/dL — ABNORMAL HIGH (ref 8–23)
CO2: 20 mmol/L — ABNORMAL LOW (ref 22–32)
Calcium: 10.2 mg/dL (ref 8.9–10.3)
Chloride: 101 mmol/L (ref 98–111)
Creatinine, Ser: 2.34 mg/dL — ABNORMAL HIGH (ref 0.44–1.00)
GFR, Estimated: 22 mL/min — ABNORMAL LOW (ref >60)
Glucose, Bld: 306 mg/dL — ABNORMAL HIGH (ref 70–99)
Potassium: 5.4 mmol/L — ABNORMAL HIGH (ref 3.5–5.1)
Sodium: 136 mmol/L (ref 135–145)
Total Bilirubin: 1.8 mg/dL — ABNORMAL HIGH (ref 0.3–1.2)
Total Protein: 8.4 g/dL — ABNORMAL HIGH (ref 6.5–8.1)

## 2022-04-17 LAB — CBC
HCT: 35.1 % — ABNORMAL LOW (ref 36.0–46.0)
Hemoglobin: 11.9 g/dL — ABNORMAL LOW (ref 12.0–15.0)
MCH: 34 pg (ref 26.0–34.0)
MCHC: 33.9 g/dL (ref 30.0–36.0)
MCV: 100.3 fL — ABNORMAL HIGH (ref 80.0–100.0)
Platelets: 375 10*3/uL (ref 150–400)
RBC: 3.5 MIL/uL — ABNORMAL LOW (ref 3.87–5.11)
RDW: 12.6 % (ref 11.5–15.5)
WBC: 11.6 10*3/uL — ABNORMAL HIGH (ref 4.0–10.5)
nRBC: 0 % (ref 0.0–0.2)

## 2022-04-17 LAB — ETHANOL: Alcohol, Ethyl (B): <10 mg/dL (ref <10)

## 2022-04-17 MED ORDER — LORAZEPAM 2 MG/ML IJ SOLN
0.0000 mg | Freq: Two times a day (BID) | INTRAMUSCULAR | Status: AC
  Administered 2022-04-21 (×2): 1 mg via INTRAVENOUS
  Filled 2022-04-17 (×3): qty 1

## 2022-04-17 MED ORDER — LORAZEPAM 1 MG PO TABS: 0.0000 mg | ORAL_TABLET | Freq: Two times a day (BID) | ORAL | Status: AC

## 2022-04-17 MED ORDER — THIAMINE HCL 100 MG/ML IJ SOLN: 100.0000 mg | Freq: Every day | INTRAMUSCULAR | Status: DC

## 2022-04-17 MED ORDER — LORAZEPAM 1 MG PO TABS
0.0000 mg | ORAL_TABLET | Freq: Four times a day (QID) | ORAL | Status: AC
  Filled 2022-04-17: qty 2

## 2022-04-17 MED ORDER — METOCLOPRAMIDE HCL 5 MG/ML IJ SOLN
10.0000 mg | Freq: Once | INTRAMUSCULAR | Status: AC
  Administered 2022-04-17: 10 mg via INTRAVENOUS
  Filled 2022-04-17: qty 2

## 2022-04-17 MED ORDER — THIAMINE MONONITRATE 100 MG PO TABS: 100.0000 mg | ORAL_TABLET | Freq: Every day | ORAL | Status: DC

## 2022-04-17 MED ORDER — SODIUM CHLORIDE 0.9 % IV BOLUS (SEPSIS)
1000.0000 mL | Freq: Once | INTRAVENOUS | Status: AC
  Administered 2022-04-17: 1000 mL via INTRAVENOUS

## 2022-04-17 MED ORDER — LORAZEPAM 2 MG/ML IJ SOLN
0.0000 mg | Freq: Four times a day (QID) | INTRAMUSCULAR | Status: AC
  Administered 2022-04-18 (×2): 2 mg via INTRAVENOUS
  Administered 2022-04-18: 1 mg via INTRAVENOUS
  Administered 2022-04-18 – 2022-04-19 (×3): 2 mg via INTRAVENOUS
  Filled 2022-04-17 (×4): qty 1
  Filled 2022-04-17: qty 2
  Filled 2022-04-17 (×3): qty 1

## 2022-04-17 NOTE — ED Notes (Signed)
Vomited moderate amount brown emesis on floor in waiting room. Genia Del, RN aware and reglan ordered and administered.

## 2022-04-17 NOTE — ED Triage Notes (Signed)
First Nurse Note:  Arrives from home via [facility].  EMS called for hyperglycemia.  CBG: 450 - 3 units of regular insulin given prior to EMS arrival.--additional 7 units given.  CBG:  391 on EMS truck.  160/90 100 NSR ETCO2 20 98.2 ax 98% RA 18 RR  Hx ETOH abuse -- 3 tall boys a day 3 - 5th of liquor per week.  Has not had any thing to drink today and little PO today.  20g L hand.  4 mg Zofran given.

## 2022-04-17 NOTE — ED Provider Notes (Signed)
[facility] Provider Note    Event Date/Time   First MD Initiated Contact with Patient 04/17/22 2347     (approximate)   History   Hyperglycemia   HPI  Laurie Lin is a 66 y.o. female with history of hypertension, type I insulin-dependent diabetes, chronic kidney disease, alcohol abuse who presents to the emergency department with her daughter for concerns for worsening mental status over the past month.  She states normally her mother is able to care for herself, balance check but but now is not even able to answer orientation questions.  Daughter reports she has not had any alcohol in 24 hours.  Daughter reports patient lives at home with her husband.  Husband stated that patient recently had a fall and had to be picked off the floor.  Is unclear if she hit her head.  She is not on blood thinners.  Patient denies any pain.  Daughter was also concerned because patient had blood sugars in the 400-500s today.  She did receive 10 units of insulin before arrival.  Family reports patient was vomiting at home.   History provided by patient's daughter, level 5 caveat secondary to patient's confusion.    Past Medical History:  Diagnosis Date   Diabetes mellitus without complication (HCC)    GERD (gastroesophageal reflux disease)    Hypertension    Renal disorder    stage 3     Past Surgical History:  Procedure Laterality Date   DILATION AND CURETTAGE OF UTERUS     Miscarriage   EYE SURGERY     bilateral caterac   FINGER SURGERY     KNEE ARTHROSCOPY Left 1989   [facility]   PILONIDAL CYST DRAINAGE      MEDICATIONS:  Prior to Admission medications   Medication Sig Start Date End Date Taking? Authorizing Provider  acetaminophen (TYLENOL) 500 MG tablet Take 500 mg by mouth 2 (two) times daily as needed.     [provider]  BD ULTRA-FINE PEN NEEDLES 29G X 12.7MM MISC 1 EACH BY MISCELLANEOUS ROUTE FIVE (5) TIMES A DAY. 03/09/18   [provider]  Continuous Blood Gluc Sensor (DEXCOM G6 SENSOR) MISC by Does not apply route. 11/10/18   [provider]  dorzolamide (TRUSOPT) 2 % ophthalmic solution TAKE 1 DROP(S) IN RIGHT EYE 3 TIMES A DAY 10/05/18   [provider]  esomeprazole (NEXIUM) 40 MG capsule TAKE 1 CAPSULE (40 MG TOTAL) BY MOUTH 2 (TWO) TIMES DAILY BEFORE A MEAL. 08/13/19   Malfi, Lupita Raider, FNP  estradiol (ESTRACE) 0.1 MG/GM vaginal cream Estrogen Cream Instruction Discard applicator Apply pea sized amount to tip of finger to urethra before bed. Wash hands well after application. Use Monday, Wednesday and Friday 08/22/20   Billey Co, MD  furosemide (LASIX) 20 MG tablet Take 1 tablet (20 mg total) by mouth daily as needed for edema. 12/02/18   Mikey College, NP  HYDROcodone-acetaminophen (NORCO/VICODIN) 5-325 MG tablet Take 1 tablet by mouth every 6 (six) hours as needed for severe pain. 10/28/19   Katy Apo, NP  insulin aspart (NOVOLOG) 100 UNIT/ML injection Inject 0-9 Units into the skin 3 (three) times daily with meals. BS 70-120 - 0 units       121-150 - 1 units       151-200 - 2 units        201-250 - 3 units        251-300 - 4 units  301-350 - 5 units        351-400 - 6 units        > 400 call md. 03/27/17   Henreitta Leber, MD  Insulin Glargine (BASAGLAR KWIKPEN) 100 UNIT/ML SOPN Inject 12 Units into the skin at bedtime.  07/01/18   [provider]  Insulin Pen Needle 32G X 4 MM MISC ok to sub any brand or size needle preferred by insurance/patient, use up to 5x/day, dx E10.65 03/02/18   [provider]  lisinopril (ZESTRIL) 2.5 MG tablet Take 1 tablet (2.5 mg total) by mouth daily. 12/02/18   Mikey College, NP  Multiple Vitamin (MULTIVITAMIN WITH MINERALS) TABS tablet Take 1 tablet by mouth daily. 03/28/17   Henreitta Leber, MD  [facility] DELICA LANCETS 05L MISC 1 Device by Does not apply route 4 (four) times daily. 05/20/17   Mikey College,  NP  sulfamethoxazole-trimethoprim (BACTRIM DS) 800-160 MG tablet Take 1 tablet by mouth every 12 (twelve) hours. 08/29/20   Billey Co, MD  tiZANidine (ZANAFLEX) 4 MG capsule Take 1 capsule (4 mg total) by mouth 3 (three) times daily as needed for muscle spasms. 10/28/19   Katy Apo, NP  sertraline (ZOLOFT) 50 MG tablet TAKE 1 TABLET BY MOUTH EVERY DAY 12/13/18 10/28/19  Mikey College, NP    Physical Exam   Triage Vital Signs: ED Triage Vitals  Enc Vitals Group     BP 04/17/22 1439 136/74     Pulse Rate 04/17/22 1439 (!) 114     Resp 04/17/22 1439 20     Temp 04/17/22 1439 97.9 F (36.6 C)     Temp Source 04/17/22 1439 Oral     SpO2 04/17/22 1439 98 %     Weight 04/17/22 1436 205 lb 0.4 oz (93 kg)     Height 04/17/22 1436 5\' 10"  (1.778 m)     Head Circumference --      Peak Flow --      Pain Score 04/17/22 1436 0     Pain Loc --      Pain Edu? --      Excl. in [place]? --     Most recent vital signs: Vitals:   04/17/22 2050 04/18/22 0000  BP: (!) 178/105 (!) 115/49  Pulse: (!) 108 (!) 104  Resp: (!) 22   Temp: 97.6 F (36.4 C)   SpO2: 100% 100%    CONSTITUTIONAL: Alert and oriented to person only.  Gets very agitated when questioned about alcohol use. HEAD: Normocephalic, atraumatic EYES: Conjunctivae clear, pupils appear equal, sclera nonicteric ENT: normal nose; moist mucous membranes NECK: Supple, normal ROM CARD: Regular and tachycardic; S1 and S2 appreciated; no murmurs, no clicks, no rubs, no gallops RESP: Normal chest excursion without splinting or tachypnea; breath sounds clear and equal bilaterally; no wheezes, no rhonchi, no rales, no hypoxia or respiratory distress, speaking full sentences ABD/GI: Normal bowel sounds; non-distended; soft, non-tender, no rebound, no guarding, no peritoneal signs BACK: The back appears normal EXT: Normal ROM in all joints; no deformity noted, no edema; no cyanosis SKIN: Normal color for age and race; warm; no rash  on exposed skin NEURO: Moves all extremities equally, normal speech PSYCH: The patient's mood and manner are appropriate.   ED Results / Procedures / Treatments   LABS: (all labs ordered are listed, but only abnormal results are displayed) Labs Reviewed  COMPREHENSIVE METABOLIC PANEL - Abnormal; Notable for the following components:  Result Value   Potassium 5.4 (*)    CO2 20 (*)    Glucose, Bld 306 (*)    BUN 26 (*)    Creatinine, Ser 2.34 (*)    Total Protein 8.4 (*)    Total Bilirubin 1.8 (*)    GFR, Estimated 22 (*)    All other components within normal limits  CBC - Abnormal; Notable for the following components:   WBC 11.6 (*)    RBC 3.50 (*)    Hemoglobin 11.9 (*)    HCT 35.1 (*)    MCV 100.3 (*)    All other components within normal limits  MAGNESIUM - Abnormal; Notable for the following components:   Magnesium 1.4 (*)    All other components within normal limits  CBG MONITORING, ED - Abnormal; Notable for the following components:   Glucose-Capillary 378 (*)    All other components within normal limits  ETHANOL  POTASSIUM  PROTIME-INR  AMMONIA  TSH  URINALYSIS, ROUTINE W REFLEX MICROSCOPIC  URINE DRUG SCREEN, QUALITATIVE ([facility] ONLY)  CBG MONITORING, ED  CBG MONITORING, ED  TROPONIN I (HIGH SENSITIVITY)     EKG:  EKG Interpretation  Date/Time:  Thursday April 17 2022 14:40:55 EST Ventricular Rate:  112 PR Interval:  118 QRS Duration: 66 QT Interval:  324 QTC Calculation: 442 R Axis:   70 Text Interpretation: Sinus tachycardia Otherwise normal ECG When compared with ECG of 12-Feb-2011 16:48, No significant change was found Confirmed by Pryor Curia [phone]) on 04/17/2022 11:41:46 PM         RADIOLOGY: My personal review and interpretation of imaging: CT head shows ventriculomegaly.  CT of the abdomen pelvis shows no acute abnormality.  CT of the neck shows no acute injury.  I have personally reviewed all radiology reports.   CT HEAD WO  CONTRAST (5MM)  Result Date: 04/18/2022 CLINICAL DATA:  Mental status change, unknown cause; Neck trauma (Age >= 65y) EXAM: CT HEAD WITHOUT CONTRAST CT CERVICAL SPINE WITHOUT CONTRAST TECHNIQUE: Multidetector CT imaging of the head and cervical spine was performed following the standard protocol without intravenous contrast. Multiplanar CT image reconstructions of the cervical spine were also generated. RADIATION DOSE REDUCTION: This exam was performed according to the departmental dose-optimization program which includes automated exposure control, adjustment of the mA and/or kV according to patient size and/or use of iterative reconstruction technique. COMPARISON:  CT head 03/23/2017 FINDINGS: CT HEAD FINDINGS Brain: Moderate parenchymal volume loss is present, relatively advanced given the patient's age. Superimposed mild periventricular white matter changes are present, likely reflecting the sequela of small vessel ischemia. There is moderate ventriculomegaly which appears progressive since prior examination and appears disproportionate to the degree of parenchymal volume loss. While this may represent asymmetric central atrophy, changes of normal pressure hydrocephalus could appear similarly. No evidence of acute intracranial hemorrhage or infarct. No abnormal mass effect or midline shift. No abnormal intra or extra-axial mass lesion or fluid collection. Cerebellum unremarkable. Vascular: No hyperdense vessel or unexpected calcification. Skull: Normal. Negative for fracture or focal lesion. Sinuses/Orbits: No acute finding. Other: Mastoid air cells and middle ear cavities are clear. CT CERVICAL SPINE FINDINGS Alignment: Mild reversal of the normal cervical lordosis. No listhesis. Skull base and vertebrae: Craniocervical alignment is normal. The atlantodental interval is not widened. No acute fracture of the cervical spine. Vertebral body height is preserved. Soft tissues and spinal canal: No prevertebral  fluid or swelling. No visible canal hematoma. Disc levels: Intervertebral disc space narrowing and endplate  remodeling is seen at C5-C7, more severe at C5-6 in keeping with changes of moderate to severe degenerative disc disease. Prevertebral soft tissues are not thickened on sagittal reformats. Spinal canal is widely patent. No high-grade neuroforaminal narrowing identified. Upper chest: Negative. Other: None IMPRESSION: 1. No acute intracranial abnormality. No calvarial fracture. 2. Progressive ventriculomegaly, disproportionate to the degree of parenchymal volume loss. While this may represent asymmetric central atrophy, changes of normal pressure hydrocephalus could appear similarly. 3. No acute fracture or listhesis of the cervical spine. Electronically Signed   By: Fidela Salisbury M.D.   On: 04/18/2022 00:53   CT Cervical Spine Wo Contrast  Result Date: 04/18/2022 CLINICAL DATA:  Mental status change, unknown cause; Neck trauma (Age >= 65y) EXAM: CT HEAD WITHOUT CONTRAST CT CERVICAL SPINE WITHOUT CONTRAST TECHNIQUE: Multidetector CT imaging of the head and cervical spine was performed following the standard protocol without intravenous contrast. Multiplanar CT image reconstructions of the cervical spine were also generated. RADIATION DOSE REDUCTION: This exam was performed according to the departmental dose-optimization program which includes automated exposure control, adjustment of the mA and/or kV according to patient size and/or use of iterative reconstruction technique. COMPARISON:  CT head 03/23/2017 FINDINGS: CT HEAD FINDINGS Brain: Moderate parenchymal volume loss is present, relatively advanced given the patient's age. Superimposed mild periventricular white matter changes are present, likely reflecting the sequela of small vessel ischemia. There is moderate ventriculomegaly which appears progressive since prior examination and appears disproportionate to the degree of parenchymal volume loss.  While this may represent asymmetric central atrophy, changes of normal pressure hydrocephalus could appear similarly. No evidence of acute intracranial hemorrhage or infarct. No abnormal mass effect or midline shift. No abnormal intra or extra-axial mass lesion or fluid collection. Cerebellum unremarkable. Vascular: No hyperdense vessel or unexpected calcification. Skull: Normal. Negative for fracture or focal lesion. Sinuses/Orbits: No acute finding. Other: Mastoid air cells and middle ear cavities are clear. CT CERVICAL SPINE FINDINGS Alignment: Mild reversal of the normal cervical lordosis. No listhesis. Skull base and vertebrae: Craniocervical alignment is normal. The atlantodental interval is not widened. No acute fracture of the cervical spine. Vertebral body height is preserved. Soft tissues and spinal canal: No prevertebral fluid or swelling. No visible canal hematoma. Disc levels: Intervertebral disc space narrowing and endplate remodeling is seen at C5-C7, more severe at C5-6 in keeping with changes of moderate to severe degenerative disc disease. Prevertebral soft tissues are not thickened on sagittal reformats. Spinal canal is widely patent. No high-grade neuroforaminal narrowing identified. Upper chest: Negative. Other: None IMPRESSION: 1. No acute intracranial abnormality. No calvarial fracture. 2. Progressive ventriculomegaly, disproportionate to the degree of parenchymal volume loss. While this may represent asymmetric central atrophy, changes of normal pressure hydrocephalus could appear similarly. 3. No acute fracture or listhesis of the cervical spine. Electronically Signed   By: Fidela Salisbury M.D.   On: 04/18/2022 00:53   CT ABDOMEN PELVIS WO CONTRAST  Result Date: 04/17/2022 CLINICAL DATA:  Abdominal pain, acute, nonlocalized EXAM: CT ABDOMEN AND PELVIS WITHOUT CONTRAST TECHNIQUE: Multidetector CT imaging of the abdomen and pelvis was performed following the standard protocol without IV  contrast. RADIATION DOSE REDUCTION: This exam was performed according to the departmental dose-optimization program which includes automated exposure control, adjustment of the mA and/or kV according to patient size and/or use of iterative reconstruction technique. COMPARISON:  None Available. FINDINGS: Lower chest: No acute findings.  Small hiatal hernia. Hepatobiliary: No focal hepatic abnormality. Gallbladder unremarkable. Pancreas: No focal abnormality or  ductal dilatation. Spleen: No focal abnormality.  Normal size. Adrenals/Urinary Tract: 2 mm nonobstructing stone in the lower pole of the left kidney. No ureteral stones or hydronephrosis. Adrenal glands and urinary bladder unremarkable. Stomach/Bowel: Normal appendix. Stomach, large and small bowel grossly unremarkable. Vascular/Lymphatic: Aortic atherosclerosis. No evidence of aneurysm or adenopathy. Reproductive: Uterus and adnexa unremarkable.  No mass. Other: No free fluid or free air. Musculoskeletal: No acute bony abnormality. IMPRESSION: No acute findings. Punctate left lower pole nephrolithiasis.  No hydronephrosis. Aortic atherosclerosis. Electronically Signed   By: Rolm Baptise M.D.   On: 04/17/2022 18:15     PROCEDURES:  Critical Care performed: No     .1-3 Lead EKG Interpretation  Performed by: Haldon Carley, Delice Bison, DO Authorized by: Angelie Kram, Delice Bison, DO     Interpretation: abnormal     ECG rate:  108   ECG rate assessment: tachycardic     Rhythm: sinus tachycardia     Ectopy: none     Conduction: normal       IMPRESSION / MDM / ASSESSMENT AND PLAN / ED COURSE  I reviewed the triage vital signs and the nursing notes.    Patient here with altered mental status, concerns for alcohol withdrawal, recent fall, hyperglycemia.  The patient is on the cardiac monitor to evaluate for evidence of arrhythmia and/or significant heart rate changes.   DIFFERENTIAL DIAGNOSIS (includes but not limited to):   CVA, intracranial hemorrhage,  Wernicke's encephalopathy, hepatic encephalopathy, stroke, alcohol withdrawal, anemia, electrolyte derangement, HHS, DKA, hyperglycemia   Patient's presentation is most consistent with acute presentation with potential threat to life or bodily function.   PLAN: Workup initiated from triage.  Patient had a potassium of 5.4 but was hemolyzed.  EKG shows no new changes.  Will recheck.  Will also check magnesium level given history of alcohol abuse.  Creatinine 2.34.  Was 2.8 at [facility] in September 2023.  Mild anemia.  Alcohol level negative.  CT of the abdomen pelvis obtained from triage and reviewed and interpreted by myself and the radiologist and shows no acute abnormality.  Will add on TSH level, troponin, urinalysis, urine drug screen, CT head.  Will give IV fluids, Ativan and thiamine.  Patient became very explosive with nursing staff when he attempted to obtain a CIWA score.   MEDICATIONS GIVEN IN ED: Medications  LORazepam (ATIVAN) injection 0-4 mg (1 mg Intravenous Given 04/18/22 0047)    Or  LORazepam (ATIVAN) tablet 0-4 mg ( Oral See Alternative 04/18/22 0047)  LORazepam (ATIVAN) injection 0-4 mg (has no administration in time range)    Or  LORazepam (ATIVAN) tablet 0-4 mg (has no administration in time range)  thiamine (VITAMIN B1) tablet 100 mg (has no administration in time range)    Or  thiamine (VITAMIN B1) injection 100 mg (has no administration in time range)  magnesium sulfate IVPB 2 g 50 mL (has no administration in time range)  metoCLOPramide (REGLAN) injection 10 mg (10 mg Intravenous Given 04/17/22 1748)  sodium chloride 0.9 % bolus 1,000 mL (1,000 mLs Intravenous New Bag/Given 04/17/22 2353)     ED COURSE: Patient's repeat potassium normal.  Magnesium level low at 1.4 likely from alcohol use.  Will give IV replacement.  Ammonia level negative.  TSH normal.  CT head reviewed and interpreted by myself and the radiologist and shows no acute abnormality.  CT of the cervical  spine shows no fracture.  Will discuss with hospitalist for admission for altered mental status.  I am  concerned for possible withdrawal versus Wernike's encephalopathy versus normal pressure hydrocephalus.   CONSULTS:  Consulted and discussed patient's case with hospitalist, Dr. Sidney Ace.  I have recommended admission and consulting physician agrees and will place admission orders.  Patient (and family if present) agree with this plan.   I reviewed all nursing notes, vitals, pertinent previous records.  All labs, EKGs, imaging ordered have been independently reviewed and interpreted by myself.    OUTSIDE RECORDS REVIEWED: Reviewed recent nephrology note with Dr. Candiss Norse on 12/17/2021.       FINAL CLINICAL IMPRESSION(S) / ED DIAGNOSES   Final diagnoses:  Altered mental status, unspecified altered mental status type  Hyperglycemia  Alcohol abuse     Rx / DC Orders   ED Discharge Orders     None        Note:  This document was prepared using Dragon voice recognition software and may include unintentional dictation errors.   Dale Strausser, Delice Bison, DO 04/18/22 [phone]

## 2022-04-17 NOTE — ED Provider Triage Note (Signed)
Emergency Medicine Provider Triage Evaluation Note  Laurie Lin , a 66 y.o. female  was evaluated in triage.  Pt complains of "not sure why I'm here".  Someone else called EMS. Hx of etoh abuse but patient states no alcohol for several days.  Denies prior withdrawal problems.   Review of Systems  Positive:  Negative:   Physical Exam  Ht 5\' 10"  (1.778 m)   Wt 93 kg   LMP 09/09/2004 (Approximate)   BMI 29.42 kg/m  Gen:   Awake, no distress  answers simple questions, flat affect.  Resp:  Normal effort  MSK:   Moves extremities without difficulty  Other:    Medical Decision Making  Medically screening exam initiated at 2:39 PM.  Appropriate orders placed.  Laurie Lin was informed that the remainder of the evaluation will be completed by another provider, this initial triage assessment does not replace that evaluation, and the importance of remaining in the ED until their evaluation is complete.     Johnn Hai, PA-C 04/17/22 1443

## 2022-04-17 NOTE — ED Triage Notes (Signed)
Pt here for hyperglycemia. Pt not able to answer questions fully.

## 2022-04-18 ENCOUNTER — Emergency Department: Payer: Medicare Other

## 2022-04-18 ENCOUNTER — Inpatient Hospital Stay: Payer: Medicare Other

## 2022-04-18 DIAGNOSIS — J690 Pneumonitis due to inhalation of food and vomit: Secondary | ICD-10-CM | POA: Diagnosis not present

## 2022-04-18 DIAGNOSIS — E872 Acidosis, unspecified: Secondary | ICD-10-CM | POA: Diagnosis present

## 2022-04-18 DIAGNOSIS — F1010 Alcohol abuse, uncomplicated: Secondary | ICD-10-CM | POA: Diagnosis present

## 2022-04-18 DIAGNOSIS — D649 Anemia, unspecified: Secondary | ICD-10-CM | POA: Diagnosis not present

## 2022-04-18 DIAGNOSIS — W19XXXA Unspecified fall, initial encounter: Secondary | ICD-10-CM | POA: Diagnosis present

## 2022-04-18 DIAGNOSIS — G9340 Encephalopathy, unspecified: Secondary | ICD-10-CM

## 2022-04-18 DIAGNOSIS — E538 Deficiency of other specified B group vitamins: Secondary | ICD-10-CM | POA: Diagnosis present

## 2022-04-18 DIAGNOSIS — Z1152 Encounter for screening for COVID-19: Secondary | ICD-10-CM | POA: Diagnosis not present

## 2022-04-18 DIAGNOSIS — E512 Wernicke's encephalopathy: Secondary | ICD-10-CM | POA: Insufficient documentation

## 2022-04-18 DIAGNOSIS — N390 Urinary tract infection, site not specified: Secondary | ICD-10-CM | POA: Diagnosis present

## 2022-04-18 DIAGNOSIS — F04 Amnestic disorder due to known physiological condition: Secondary | ICD-10-CM | POA: Diagnosis not present

## 2022-04-18 DIAGNOSIS — E663 Overweight: Secondary | ICD-10-CM | POA: Diagnosis present

## 2022-04-18 DIAGNOSIS — F1026 Alcohol dependence with alcohol-induced persisting amnestic disorder: Secondary | ICD-10-CM | POA: Diagnosis present

## 2022-04-18 DIAGNOSIS — I10 Essential (primary) hypertension: Secondary | ICD-10-CM | POA: Diagnosis not present

## 2022-04-18 DIAGNOSIS — E10649 Type 1 diabetes mellitus with hypoglycemia without coma: Secondary | ICD-10-CM | POA: Diagnosis not present

## 2022-04-18 DIAGNOSIS — R4182 Altered mental status, unspecified: Secondary | ICD-10-CM | POA: Diagnosis not present

## 2022-04-18 DIAGNOSIS — G9341 Metabolic encephalopathy: Secondary | ICD-10-CM | POA: Diagnosis present

## 2022-04-18 DIAGNOSIS — D638 Anemia in other chronic diseases classified elsewhere: Secondary | ICD-10-CM | POA: Diagnosis present

## 2022-04-18 DIAGNOSIS — K219 Gastro-esophageal reflux disease without esophagitis: Secondary | ICD-10-CM | POA: Diagnosis present

## 2022-04-18 DIAGNOSIS — N1832 Chronic kidney disease, stage 3b: Secondary | ICD-10-CM | POA: Diagnosis not present

## 2022-04-18 DIAGNOSIS — R471 Dysarthria and anarthria: Secondary | ICD-10-CM | POA: Diagnosis not present

## 2022-04-18 DIAGNOSIS — E1065 Type 1 diabetes mellitus with hyperglycemia: Secondary | ICD-10-CM | POA: Diagnosis present

## 2022-04-18 DIAGNOSIS — J9601 Acute respiratory failure with hypoxia: Secondary | ICD-10-CM | POA: Diagnosis not present

## 2022-04-18 DIAGNOSIS — N1831 Chronic kidney disease, stage 3a: Secondary | ICD-10-CM | POA: Diagnosis present

## 2022-04-18 DIAGNOSIS — I129 Hypertensive chronic kidney disease with stage 1 through stage 4 chronic kidney disease, or unspecified chronic kidney disease: Secondary | ICD-10-CM | POA: Diagnosis present

## 2022-04-18 DIAGNOSIS — E870 Hyperosmolality and hypernatremia: Secondary | ICD-10-CM | POA: Diagnosis present

## 2022-04-18 DIAGNOSIS — L299 Pruritus, unspecified: Secondary | ICD-10-CM | POA: Diagnosis not present

## 2022-04-18 DIAGNOSIS — K922 Gastrointestinal hemorrhage, unspecified: Secondary | ICD-10-CM | POA: Diagnosis not present

## 2022-04-18 DIAGNOSIS — F10231 Alcohol dependence with withdrawal delirium: Secondary | ICD-10-CM | POA: Diagnosis not present

## 2022-04-18 DIAGNOSIS — K921 Melena: Secondary | ICD-10-CM | POA: Diagnosis not present

## 2022-04-18 DIAGNOSIS — N179 Acute kidney failure, unspecified: Secondary | ICD-10-CM | POA: Diagnosis present

## 2022-04-18 DIAGNOSIS — E871 Hypo-osmolality and hyponatremia: Secondary | ICD-10-CM | POA: Diagnosis not present

## 2022-04-18 DIAGNOSIS — J40 Bronchitis, not specified as acute or chronic: Secondary | ICD-10-CM | POA: Diagnosis present

## 2022-04-18 LAB — PROTIME-INR
INR: 1 (ref 0.8–1.2)
Prothrombin Time: 12.9 seconds (ref 11.4–15.2)

## 2022-04-18 LAB — URINALYSIS, COMPLETE (UACMP) WITH MICROSCOPIC
Bilirubin Urine: NEGATIVE
Glucose, UA: >=500 mg/dL — AB
Ketones, ur: 20 mg/dL — AB
Nitrite: NEGATIVE
Protein, ur: 100 mg/dL — AB
Specific Gravity, Urine: 1.015 (ref 1.005–1.030)
WBC, UA: >50 WBC/hpf — ABNORMAL HIGH (ref 0–5)
pH: 6 (ref 5.0–8.0)

## 2022-04-18 LAB — LACTIC ACID, PLASMA: Lactic Acid, Venous: 1.2 mmol/L (ref 0.5–1.9)

## 2022-04-18 LAB — URINE DRUG SCREEN, QUALITATIVE (ARMC ONLY)
Amphetamines, Ur Screen: NOT DETECTED
Barbiturates, Ur Screen: NOT DETECTED
Benzodiazepine, Ur Scrn: NOT DETECTED
Cannabinoid 50 Ng, Ur ~~LOC~~: NOT DETECTED
Cocaine Metabolite,Ur ~~LOC~~: NOT DETECTED
MDMA (Ecstasy)Ur Screen: NOT DETECTED
Methadone Scn, Ur: NOT DETECTED
Opiate, Ur Screen: NOT DETECTED
Phencyclidine (PCP) Ur S: NOT DETECTED
Tricyclic, Ur Screen: NOT DETECTED

## 2022-04-18 LAB — BASIC METABOLIC PANEL
Anion gap: 15 (ref 5–15)
BUN: 33 mg/dL — ABNORMAL HIGH (ref 8–23)
CO2: 18 mmol/L — ABNORMAL LOW (ref 22–32)
Calcium: 9.3 mg/dL (ref 8.9–10.3)
Chloride: 104 mmol/L (ref 98–111)
Creatinine, Ser: 2.74 mg/dL — ABNORMAL HIGH (ref 0.44–1.00)
GFR, Estimated: 18 mL/min — ABNORMAL LOW (ref >60)
Glucose, Bld: 458 mg/dL — ABNORMAL HIGH (ref 70–99)
Potassium: 4.8 mmol/L (ref 3.5–5.1)
Sodium: 137 mmol/L (ref 135–145)

## 2022-04-18 LAB — POTASSIUM: Potassium: 4.7 mmol/L (ref 3.5–5.1)

## 2022-04-18 LAB — CBG MONITORING, ED
Glucose-Capillary: 119 mg/dL — ABNORMAL HIGH (ref 70–99)
Glucose-Capillary: 182 mg/dL — ABNORMAL HIGH (ref 70–99)
Glucose-Capillary: 378 mg/dL — ABNORMAL HIGH (ref 70–99)
Glucose-Capillary: 420 mg/dL — ABNORMAL HIGH (ref 70–99)
Glucose-Capillary: 424 mg/dL — ABNORMAL HIGH (ref 70–99)

## 2022-04-18 LAB — TYPE AND SCREEN
ABO/RH(D): A POS
Antibody Screen: NEGATIVE

## 2022-04-18 LAB — AMMONIA: Ammonia: <10 umol/L (ref 9–35)

## 2022-04-18 LAB — HIV ANTIBODY (ROUTINE TESTING W REFLEX): HIV Screen 4th Generation wRfx: NONREACTIVE

## 2022-04-18 LAB — TSH: TSH: 1.977 u[IU]/mL (ref 0.350–4.500)

## 2022-04-18 LAB — TROPONIN I (HIGH SENSITIVITY): Troponin I (High Sensitivity): 12 ng/L (ref <18)

## 2022-04-18 LAB — MAGNESIUM: Magnesium: 1.4 mg/dL — ABNORMAL LOW (ref 1.7–2.4)

## 2022-04-18 MED ORDER — INSULIN GLARGINE-YFGN 100 UNIT/ML ~~LOC~~ SOLN: 12.0000 [IU] | Freq: Every day | SUBCUTANEOUS | Status: DC

## 2022-04-18 MED ORDER — FOLIC ACID 1 MG PO TABS
1.0000 mg | ORAL_TABLET | Freq: Once | ORAL | Status: DC
  Filled 2022-04-18: qty 1

## 2022-04-18 MED ORDER — SODIUM CHLORIDE 0.9 % IV SOLN: INTRAVENOUS | Status: DC

## 2022-04-18 MED ORDER — INSULIN ASPART 100 UNIT/ML IJ SOLN: 2.0000 [IU] | Freq: Three times a day (TID) | INTRAMUSCULAR | Status: DC

## 2022-04-18 MED ORDER — ONDANSETRON HCL 4 MG/2ML IJ SOLN: 4.0000 mg | Freq: Four times a day (QID) | INTRAMUSCULAR | Status: DC | PRN

## 2022-04-18 MED ORDER — GADOBUTROL 1 MMOL/ML IV SOLN
9.0000 mL | Freq: Once | INTRAVENOUS | Status: AC | PRN
  Administered 2022-04-18: 9 mL via INTRAVENOUS

## 2022-04-18 MED ORDER — THIAMINE HCL 100 MG/ML IJ SOLN: Freq: Once | INTRAVENOUS | Status: DC

## 2022-04-18 MED ORDER — ADULT MULTIVITAMIN W/MINERALS CH: 1.0000 | ORAL_TABLET | Freq: Every day | ORAL | Status: DC

## 2022-04-18 MED ORDER — PANTOPRAZOLE SODIUM 40 MG PO TBEC
40.0000 mg | DELAYED_RELEASE_TABLET | Freq: Every day | ORAL | Status: DC
  Filled 2022-04-18: qty 1

## 2022-04-18 MED ORDER — ADULT MULTIVITAMIN W/MINERALS CH
1.0000 | ORAL_TABLET | Freq: Every day | ORAL | Status: DC
  Filled 2022-04-18: qty 1

## 2022-04-18 MED ORDER — ONDANSETRON HCL 4 MG PO TABS: 4.0000 mg | ORAL_TABLET | Freq: Four times a day (QID) | ORAL | Status: DC | PRN

## 2022-04-18 MED ORDER — DORZOLAMIDE HCL 2 % OP SOLN: 1.0000 [drp] | Freq: Three times a day (TID) | OPHTHALMIC | Status: DC

## 2022-04-18 MED ORDER — INSULIN ASPART 100 UNIT/ML IJ SOLN
0.0000 [IU] | Freq: Three times a day (TID) | INTRAMUSCULAR | Status: DC
  Administered 2022-04-19: 2 [IU] via SUBCUTANEOUS
  Administered 2022-04-20 (×2): 1 [IU] via SUBCUTANEOUS
  Filled 2022-04-18 (×3): qty 1

## 2022-04-18 MED ORDER — ACETAMINOPHEN 325 MG PO TABS: 650.0000 mg | ORAL_TABLET | Freq: Four times a day (QID) | ORAL | Status: DC | PRN

## 2022-04-18 MED ORDER — MAGNESIUM SULFATE 2 GM/50ML IV SOLN
2.0000 g | Freq: Once | INTRAVENOUS | Status: AC
  Administered 2022-04-18: 2 g via INTRAVENOUS
  Filled 2022-04-18: qty 50

## 2022-04-18 MED ORDER — ACETAMINOPHEN 650 MG RE SUPP: 650.0000 mg | Freq: Four times a day (QID) | RECTAL | Status: DC | PRN

## 2022-04-18 MED ORDER — THIAMINE MONONITRATE 100 MG PO TABS
100.0000 mg | ORAL_TABLET | Freq: Every day | ORAL | Status: DC
  Filled 2022-04-18: qty 1

## 2022-04-18 MED ORDER — SODIUM CHLORIDE 0.9 % IV SOLN
100.0000 mg | Freq: Two times a day (BID) | INTRAVENOUS | Status: DC
  Administered 2022-04-18 – 2022-04-19 (×3): 100 mg via INTRAVENOUS
  Filled 2022-04-18 (×5): qty 100

## 2022-04-18 MED ORDER — TRAZODONE HCL 50 MG PO TABS: 25.0000 mg | ORAL_TABLET | Freq: Every evening | ORAL | Status: DC | PRN

## 2022-04-18 MED ORDER — MAGNESIUM HYDROXIDE 400 MG/5ML PO SUSP: 30.0000 mL | Freq: Every day | ORAL | Status: DC | PRN

## 2022-04-18 MED ORDER — LISINOPRIL 5 MG PO TABS
2.5000 mg | ORAL_TABLET | Freq: Every day | ORAL | Status: DC
  Filled 2022-04-18: qty 1

## 2022-04-18 MED ORDER — METOCLOPRAMIDE HCL 5 MG/ML IJ SOLN
5.0000 mg | Freq: Three times a day (TID) | INTRAMUSCULAR | Status: DC
  Administered 2022-04-18 – 2022-05-06 (×53): 5 mg via INTRAVENOUS
  Filled 2022-04-18 (×53): qty 2

## 2022-04-18 MED ORDER — THIAMINE HCL 100 MG/ML IJ SOLN
100.0000 mg | Freq: Every day | INTRAMUSCULAR | Status: DC
  Administered 2022-04-19 – 2022-04-21 (×3): 100 mg via INTRAVENOUS
  Filled 2022-04-18 (×4): qty 2

## 2022-04-18 MED ORDER — TIZANIDINE HCL 4 MG PO TABS: 4.0000 mg | ORAL_TABLET | Freq: Three times a day (TID) | ORAL | Status: DC | PRN

## 2022-04-18 MED ORDER — SODIUM CHLORIDE 0.9 % IV SOLN: 12.5000 mg | Freq: Four times a day (QID) | INTRAVENOUS | Status: DC | PRN

## 2022-04-18 MED ORDER — SODIUM CHLORIDE 0.9 % IV BOLUS
1000.0000 mL | Freq: Once | INTRAVENOUS | Status: AC
  Administered 2022-04-18: 1000 mL via INTRAVENOUS

## 2022-04-18 MED ORDER — MAGNESIUM SULFATE 2 GM/50ML IV SOLN
2.0000 g | Freq: Once | INTRAVENOUS | Status: DC
  Filled 2022-04-18: qty 50

## 2022-04-18 MED ORDER — HYDROCODONE-ACETAMINOPHEN 5-325 MG PO TABS: 1.0000 | ORAL_TABLET | Freq: Four times a day (QID) | ORAL | Status: DC | PRN

## 2022-04-18 MED ORDER — INSULIN ASPART 100 UNIT/ML IJ SOLN
0.0000 [IU] | Freq: Three times a day (TID) | INTRAMUSCULAR | Status: DC
  Administered 2022-04-18: 2 [IU] via SUBCUTANEOUS
  Administered 2022-04-18: 20 [IU] via SUBCUTANEOUS
  Filled 2022-04-18: qty 1

## 2022-04-18 MED ORDER — PANTOPRAZOLE SODIUM 40 MG IV SOLR
40.0000 mg | INTRAVENOUS | Status: DC
  Administered 2022-04-18 – 2022-04-25 (×8): 40 mg via INTRAVENOUS
  Filled 2022-04-18 (×8): qty 10

## 2022-04-18 MED ORDER — ENOXAPARIN SODIUM 30 MG/0.3ML IJ SOSY
30.0000 mg | PREFILLED_SYRINGE | INTRAMUSCULAR | Status: DC
  Filled 2022-04-18: qty 0.3

## 2022-04-18 MED ORDER — INSULIN GLARGINE-YFGN 100 UNIT/ML ~~LOC~~ SOLN
12.0000 [IU] | Freq: Every day | SUBCUTANEOUS | Status: DC
  Administered 2022-04-18 – 2022-05-06 (×18): 12 [IU] via SUBCUTANEOUS
  Filled 2022-04-18 (×19): qty 0.12

## 2022-04-18 NOTE — Assessment & Plan Note (Addendum)
BNP elevated at 356 and blood pressures were starting to trend upward.  Echocardiogram noted normal diastolic function and ejection fraction of 50 to 55%, on the low end of normal.  Patient has received 1 dose of IV Lasix.  Will add scheduled IV Lopressor given heart rate staying in the 90s and blood pressures ranging from the 130s to 170s.  Once PEG tube in place, can change to p.o. beta-blocker

## 2022-04-18 NOTE — ED Notes (Signed)
Bed alarm went off. Pt tried to climb out of bed at end. Raquel and this RN redirected pt and repositioned her. Pt educated about call bell; call bell within reach; stretcher locked low; rails up; monitor adjusted as pt had accidentally pulled leads loose.

## 2022-04-18 NOTE — ED Notes (Signed)
Began to ask pt CIWA protocol questions and the patient went from calm and cooperative with a pleasant and compliant demeanor to instantly angry and challenging. She began to remove the SP02 sensor and BP cuff and immediately started arguing with her daughter stating "that's it, I'm done". This nurse was able to divert her attention back to her hyperglycemia and convince her to allow the CBG and CT scans. Dr Leonides Schanz notified. Verbal orders received to give pt 1mg  ativan.

## 2022-04-18 NOTE — Assessment & Plan Note (Addendum)
Most likely prerenal, creatinine fluctuating. -Monitor renal function -Avoid nephrotoxins

## 2022-04-18 NOTE — Progress Notes (Signed)
Pt seen and examined , she was admitted earlier this morning by Dr Sidney Ace, please see note for details.    Laurie Lin is a 66 y.o. Caucasian female with medical history significant for type 1 diabetes mellitus, hypertension, stage III chronic kidney disease, alcohol abuse and hypertension, presented to the emergency room with acute onset of altered mental status with confusion which has been worsening over the last month.   She was admitted for alcohol intoxication and withdrawals/ Acute metabolic encephalopathy and AKI, Uncontrolled DM.    General exam: ill appearing lady not in distress.  Respiratory system: air entry fair. On RA.  Cardiovascular system: S1 & S2 heard, RRR. No JVD, No pedal edema. Gastrointestinal system: Abdomen is nondistended, soft and nontender.  Central nervous system: Alert but confused.  Extremities: no pedal edema.  Skin: No rashes, Psychiatry: Mood & affect appropriate.     Plan:  Continue with CIWA.  Hydrate and repeat renal parameters in am ( No Hydronephrosis)  Continue with IV protonix, IV phenergan.  Monitor cbg's on Semglee and SSI.  Slp eval  MRI brain without contrast if her mental status does not improve in the next 24 hours.   Hosie Poisson, MD

## 2022-04-18 NOTE — ED Notes (Signed)
Pt thought her briefs were wet from BM; checked and pt's briefs currently dry; purewick remains in place; pt tried to get out of bed again and started pulling off monitor; educated and redirected pt; reminded pt that purewick is in use. Bed alarm in use and door open for safety.

## 2022-04-18 NOTE — ED Notes (Signed)
Pt vomiting coffee ground emesis, hospitalist made aware.

## 2022-04-18 NOTE — Assessment & Plan Note (Addendum)
MRI ruled out CVA, but does note significant wear and tear disproportionate for patient's age.  Discussed with the patient's husband and he understands.  If patient not improved, husband is interested in a long-term care facility.  Decompensated requiring Precedex drip and mechanical intubation.  Patient able to be weaned off Precedex and extubated 1/4.  She is now more appropriate and still weak and still slightly confused, but answering some questions.  Will take out of ICU.  PT and OT recommending CIR, but patient not felt to be good candidate for CIR given waxing waning mental status, so would need skilled nursing.  At this time, it is unclear how much of her mentation issues are going to be long-term.  By treating underlying infection, taking out of ICU and minimizing delirium, monitoring for over sedating medications and improving nutrition, hopefully she will improve

## 2022-04-18 NOTE — Progress Notes (Signed)
SLP Cancellation Note  Patient Details Name: Laurie Lin MRN: [id number] DOB: 07-22-1955   Cancelled treatment:       Reason Eval/Treat Not Completed: Fatigue/lethargy limiting ability to participate  Pt was soundly sleeping during SLP attempt to see pt. Per nursing and chart, pt has been difficult to re-direct when awake. Given that pt doesn't have any history of dysphagia or intervention for oropharyngeal related aspiration, I spoke with pt's nurse and recommended nurse to administered the [place] when pt is awake. If pt fails screen, please re-consult ST for further evaluation.   Badr Piedra B. Rutherford Nail, M.S., CCC-SLP, [place] Pathologist Certified Brain Injury [place]  [facility] [phone] Ascom [phone] Fax [phone]  Stormy Fabian 04/18/2022, 2:45 PM

## 2022-04-18 NOTE — ED Notes (Signed)
Visitor at bedside. Pt continues to sleep.

## 2022-04-18 NOTE — Assessment & Plan Note (Addendum)
According to husband, patient was drinking 7-8 beers per day and has not had any alcohol in at least several days before coming in.  Status post full withdrawals.  Now on vitamin supplementation

## 2022-04-18 NOTE — Progress Notes (Signed)
PHARMACIST - PHYSICIAN COMMUNICATION  CONCERNING:  Enoxaparin (Lovenox) for DVT Prophylaxis    RECOMMENDATION: Patient was prescribed enoxaprin 40mg  q24 hours for VTE prophylaxis.   Filed Weights   04/17/22 1436  Weight: 93 kg (205 lb 0.4 oz)    Body mass index is 29.42 kg/m.  Estimated Creatinine Clearance: 28.8 mL/min (A) (by C-G formula based on SCr of 2.34 mg/dL (H)).  Patient is candidate for enoxaparin 30mg  every 24 hours based on CrCl <16ml/min or Weight <45kg  DESCRIPTION: Pharmacy has adjusted enoxaparin dose per [facility] policy.  Patient is now receiving enoxaparin 30 mg every 24 hours   Renda Rolls, PharmD, [facility] 04/18/2022 5:41 AM

## 2022-04-18 NOTE — ED Notes (Signed)
Pt climbed to end of stretcher again. Visitors at bedside. This RN to bedside; pt requesting to use toilet. Pt stated needs to urinate; explained again that purewick in place. Purewick removed since pt not comprehending/remembering its existence and purpose. Pt assisted up to bedside commode. Pt urinated. Pt assisted with peri care. Pt's bed pads rearranged. Pt assisted back into bed; pt very weak. Pt repositioned. Monitor reapplied as pt had pulled everything off. Stretcher locked low, rails up, call bell within reach. Door remains open for safety.

## 2022-04-18 NOTE — Assessment & Plan Note (Addendum)
-   The patient will be placed on supplement coverage with NovoLog and we will continue her basal coverage.  A1c notes moderate control at 7.2, although I suspect some of her overall " controlled blood sugars" are due to poor calorie intake secondary to alcoholism.  CBGs initially in the 300-400s.  Not in DKA.  Appreciate diabetes coordinator help.  Now with sliding scale and subcu insulin.

## 2022-04-18 NOTE — ED Notes (Signed)
Orders performed as ordered, 2nd IV started.

## 2022-04-18 NOTE — Inpatient Diabetes Management (Signed)
Inpatient Diabetes Program Recommendations  AACE/ADA: New Consensus Statement on Inpatient Glycemic Control   Target Ranges:  Prepandial:   less than 140 mg/dL      Peak postprandial:   less than 180 mg/dL (1-2 hours)      Critically ill patients:  140 - 180 mg/dL    Latest Reference Range & Units 04/18/22 00:12 04/18/22 07:39  Glucose-Capillary 70 - 99 mg/dL 378 (H) 420 (H)    Latest Reference Range & Units 04/17/22 14:45  CO2 22 - 32 mmol/L 20 (L)  Glucose 70 - 99 mg/dL 306 (H)  BUN 8 - 23 mg/dL 26 (H)  Creatinine 0.44 - 1.00 mg/dL 2.34 (H)  Anion gap 5 - 15  15    11/03/19 00:00  Hemoglobin A1C 6.8 (E)  (E): External lab result  Review of Glycemic Control  Diabetes history: DM1 (does not make any insulin; requires basal, correction, and carbohydrate coverage insulin) Outpatient Diabetes medications: Basaglar 12 units QHS, Novolog 0-9 units TID with meals  Current orders for Inpatient glycemic control: Semglee 12 units QHS, Novolog 0-20 units TID with meals  Inpatient Diabetes Program Recommendations:    Labs: Please order stat BMET to ensure patient is not in DKA.   Insulin: If patient is in DKA, she will require IV insulin. If labs do not indicate DKA, please consider ordering Semglee 12 units Q24H (to start now), CBGs AC&HS, and Novolog 3 units TID with meals if patient eats at least 50% of meals.  NOTE: Patient has Type 1 DM and takes Consulting [employer] outpatient. Currently ordered Semglee 12 units QHS and Novolog 0-20 units TID (resistant scale). Initial glucose 306, CO2 20, AG 15 at 14:45 on 12/28. NO insulin has been given since arrival to hospital and finger stick glucose is 420 mg/dl this morning. Would recommend to order stat BMET to ensure pt is not in DKA. Per chart reivew, noted in Care Everywhere that patient sees Dr. Sheppard [facility]) and was last seen on 11/25/21. Per office note on 11/25/21, patient is prescribed Basaglar 8-12 units daily, Novolog 1 unit  per 15 grams of carbs, and 1 unit for every 40 mg/dl above 150 mg/dl.  Thanks, Barnie Alderman, RN, MSN, [place] Diabetes Coordinator Inpatient Diabetes Program [phone] (Team Pager from 8am to [place])

## 2022-04-18 NOTE — Assessment & Plan Note (Signed)
-   We will continue her PPI therapy.

## 2022-04-18 NOTE — H&P (Signed)
[place]   PATIENT NAME: Laurie Lin    MR#:  [id number]  DATE OF BIRTH:  1955/11/23  DATE OF ADMISSION:  04/17/2022  PRIMARY CARE PHYSICIAN: Pcp, No   Patient is coming from: Home  REQUESTING/REFERRING PHYSICIAN: Ward, Delice Bison, DO  CHIEF COMPLAINT:   Chief Complaint  Patient presents with   Hyperglycemia    HISTORY OF PRESENT ILLNESS:  Laurie Lin is a 66 y.o. Caucasian female with medical history significant for type 1 diabetes mellitus, hypertension, stage III chronic kidney disease, alcohol abuse and hypertension, presented to the emergency room with acute onset of altered mental status with confusion which has been worsening over the last month.  Her daughter who was with her in the ER stated that she is normally able to care for herself and balance check but currently she is not able to even answer orientation questions.  She has not had any alcohol in the last 24 hours and usually drinks 7-8 beers per day.  The patient lives with her husband and and recently had a fall and had to be picked up from the floor.  It is unclear if she had any head injuries.  Her blood glucose levels have been elevated to 400-500s.  She was given 10 units of insulin before coming to the ER.  She was vomiting as well at home.  No reported abdominal pain.  No reported chest pain or palpitations.  No reported cough or wheezing.  The patient is a poor historian due to her altered mental status and confusion.  ED Course: When she came to the ER, heart rate was 114 with otherwise normal vital signs.  Labs revealed mild hyperkalemia 5.4 and hyperglycemia of 306 with a BUN of 26 and creatinine 2.34 above previous normal levels.  Total bili 1.8, total protein 8.4..  Magnesium level was 1.4.  High sensitive troponin I was 12.  CBC showed mild leukocytosis of 11.6 and mild anemia.  TSH was 1.9 PT 12.9 with INR 1. EKG as reviewed by me : Sinus tachycardia with a rate of 112 Imaging: Abdominal and  pelvic CT scan revealed punctate left lower pole nephrolithiasis with no hydronephrosis.  It showed no acute findings and did show aortic atherosclerosis noncontrasted head CT scan as well as C-spine CT showed the following: 1. No acute intracranial abnormality. No calvarial fracture. 2. Progressive ventriculomegaly, disproportionate to the degree of parenchymal volume loss. While this may represent asymmetric central atrophy, changes of normal pressure hydrocephalus could appear similarly. 3. No acute fracture or listhesis of the cervical spine.  The patient was given 10 mg of IV Reglan and 1 L bolus of IV normal saline.  CIWA protocol started and she was given 2 g of IV magnesium sulfate.  She will be admitted to a medical telemetry bed for further evaluation and management.     PAST MEDICAL HISTORY:   Past Medical History:  Diagnosis Date   Diabetes mellitus without complication (HCC)    GERD (gastroesophageal reflux disease)    Hypertension    Renal disorder    stage 3     PAST SURGICAL HISTORY:   Past Surgical History:  Procedure Laterality Date   DILATION AND CURETTAGE OF UTERUS     Miscarriage   EYE SURGERY     bilateral caterac   FINGER SURGERY     KNEE ARTHROSCOPY Left 1989   [facility]   PILONIDAL CYST DRAINAGE      SOCIAL HISTORY:  Social History   Tobacco Use   Smoking status: Never   Smokeless tobacco: Never  Substance Use Topics   Alcohol use: Yes    Alcohol/week: 6.0 standard drinks of alcohol    Types: 3 Cans of beer, 3 Shots of liquor per week    Comment: heavy drinker    FAMILY HISTORY:   Family History  Problem Relation Age of Onset   Diabetes Mother     DRUG ALLERGIES:   Allergies  Allergen Reactions   Ibuprofen Nausea Only   Nsaids     Ulcerative stomach and small intestines   Brimonidine Itching    Red, itchy, sensitivity to light Red, itchy, sensitivity to light    Latex Itching    Other reaction(s): Other (see comments)     REVIEW OF SYSTEMS:   ROS As per history of present illness. All pertinent systems were reviewed above. Constitutional, HEENT, cardiovascular, respiratory, GI, GU, musculoskeletal, neuro, psychiatric, endocrine, integumentary and hematologic systems were reviewed and are otherwise negative/unremarkable except for positive findings mentioned above in the HPI.   MEDICATIONS AT HOME:   Prior to Admission medications   Medication Sig Start Date End Date Taking? Authorizing Provider  acetaminophen (TYLENOL) 500 MG tablet Take 500 mg by mouth 2 (two) times daily as needed.     [provider]  BD ULTRA-FINE PEN NEEDLES 29G X 12.7MM MISC 1 EACH BY MISCELLANEOUS ROUTE FIVE (5) TIMES A DAY. 03/09/18   [provider]  Continuous Blood Gluc Sensor (DEXCOM G6 SENSOR) MISC by Does not apply route. 11/10/18   [provider]  dorzolamide (TRUSOPT) 2 % ophthalmic solution TAKE 1 DROP(S) IN RIGHT EYE 3 TIMES A DAY 10/05/18   [provider]  esomeprazole (NEXIUM) 40 MG capsule TAKE 1 CAPSULE (40 MG TOTAL) BY MOUTH 2 (TWO) TIMES DAILY BEFORE A MEAL. 08/13/19   Malfi, Lupita Raider, FNP  estradiol (ESTRACE) 0.1 MG/GM vaginal cream Estrogen Cream Instruction Discard applicator Apply pea sized amount to tip of finger to urethra before bed. Wash hands well after application. Use Monday, Wednesday and Friday 08/22/20   Billey Co, MD  furosemide (LASIX) 20 MG tablet Take 1 tablet (20 mg total) by mouth daily as needed for edema. 12/02/18   Mikey College, NP  HYDROcodone-acetaminophen (NORCO/VICODIN) 5-325 MG tablet Take 1 tablet by mouth every 6 (six) hours as needed for severe pain. 10/28/19   Katy Apo, NP  insulin aspart (NOVOLOG) 100 UNIT/ML injection Inject 0-9 Units into the skin 3 (three) times daily with meals. BS 70-120 - 0 units       121-150 - 1 units       151-200 - 2 units        201-250 - 3 units        251-300 - 4 units        301-350 - 5 units         351-400 - 6 units        > 400 call md. 03/27/17   Henreitta Leber, MD  Insulin Glargine (BASAGLAR KWIKPEN) 100 UNIT/ML SOPN Inject 12 Units into the skin at bedtime.  07/01/18   [provider]  Insulin Pen Needle 32G X 4 MM MISC ok to sub any brand or size needle preferred by insurance/patient, use up to 5x/day, dx E10.65 03/02/18   [provider]  lisinopril (ZESTRIL) 2.5 MG tablet Take 1 tablet (2.5 mg total) by mouth daily. 12/02/18   Merrilyn Puma,  Jerrel Ivory, NP  Multiple Vitamin (MULTIVITAMIN WITH MINERALS) TABS tablet Take 1 tablet by mouth daily. 03/28/17   Henreitta Leber, MD  [facility] DELICA LANCETS 93X MISC 1 Device by Does not apply route 4 (four) times daily. 05/20/17   Mikey College, NP  sulfamethoxazole-trimethoprim (BACTRIM DS) 800-160 MG tablet Take 1 tablet by mouth every 12 (twelve) hours. 08/29/20   Billey Co, MD  tiZANidine (ZANAFLEX) 4 MG capsule Take 1 capsule (4 mg total) by mouth 3 (three) times daily as needed for muscle spasms. 10/28/19   Katy Apo, NP  sertraline (ZOLOFT) 50 MG tablet TAKE 1 TABLET BY MOUTH EVERY DAY 12/13/18 10/28/19  Mikey College, NP      VITAL SIGNS:  Blood pressure (!) 138/96, pulse (!) 109, temperature 97.6 F (36.4 C), temperature source Oral, resp. rate (!) 22, height 5\' 10"  (1.778 m), weight 93 kg, last menstrual period 09/09/2004, SpO2 100 %.  PHYSICAL EXAMINATION:  Physical Exam  GENERAL:  66 y.o.-year-old Caucasian female patient lying in the bed with no acute distress.  She was alert and oriented x 2 to her name and place but not to time. EYES: Pupils equal, round, reactive to light and accommodation. No scleral icterus. Extraocular muscles intact.  HEENT: Head atraumatic, normocephalic. Oropharynx with dry mucous membranes, and nasopharynx clear.  NECK:  Supple, no jugular venous distention. No thyroid enlargement, no tenderness.  LUNGS: Normal breath sounds bilaterally, no wheezing,  rales,rhonchi or crepitation. No use of accessory muscles of respiration.  CARDIOVASCULAR: Regular rate and rhythm, S1, S2 normal. No murmurs, rubs, or gallops.  ABDOMEN: Soft, nondistended, nontender. Bowel sounds present. No organomegaly or mass.  EXTREMITIES: No pedal edema, cyanosis, or clubbing.  NEUROLOGIC: Cranial nerves II through XII are intact. Muscle strength 5/5 in all extremities. Sensation intact. Gait not checked.  PSYCHIATRIC: The patient is alert and oriented x 2 to place and person only.  Flat affect and good eye contact. SKIN: No obvious rash, lesion, or ulcer.   LABORATORY PANEL:   CBC Recent Labs  Lab 04/17/22 1445  WBC 11.6*  HGB 11.9*  HCT 35.1*  PLT 375   ------------------------------------------------------------------------------------------------------------------  Chemistries  Recent Labs  Lab 04/17/22 1445 04/18/22 0044  NA 136  --   K 5.4* 4.7  CL 101  --   CO2 20*  --   GLUCOSE 306*  --   BUN 26*  --   CREATININE 2.34*  --   CALCIUM 10.2  --   MG  --  1.4*  AST 29  --   ALT 13  --   ALKPHOS 52  --   BILITOT 1.8*  --    ------------------------------------------------------------------------------------------------------------------  Cardiac Enzymes No results for input(s): "TROPONINI" in the last 168 hours. ------------------------------------------------------------------------------------------------------------------  RADIOLOGY:  CT HEAD WO CONTRAST (5MM)  Result Date: 04/18/2022 CLINICAL DATA:  Mental status change, unknown cause; Neck trauma (Age >= 65y) EXAM: CT HEAD WITHOUT CONTRAST CT CERVICAL SPINE WITHOUT CONTRAST TECHNIQUE: Multidetector CT imaging of the head and cervical spine was performed following the standard protocol without intravenous contrast. Multiplanar CT image reconstructions of the cervical spine were also generated. RADIATION DOSE REDUCTION: This exam was performed according to the departmental  dose-optimization program which includes automated exposure control, adjustment of the mA and/or kV according to patient size and/or use of iterative reconstruction technique. COMPARISON:  CT head 03/23/2017 FINDINGS: CT HEAD FINDINGS Brain: Moderate parenchymal volume loss is present, relatively advanced given the patient's age. Superimposed  mild periventricular white matter changes are present, likely reflecting the sequela of small vessel ischemia. There is moderate ventriculomegaly which appears progressive since prior examination and appears disproportionate to the degree of parenchymal volume loss. While this may represent asymmetric central atrophy, changes of normal pressure hydrocephalus could appear similarly. No evidence of acute intracranial hemorrhage or infarct. No abnormal mass effect or midline shift. No abnormal intra or extra-axial mass lesion or fluid collection. Cerebellum unremarkable. Vascular: No hyperdense vessel or unexpected calcification. Skull: Normal. Negative for fracture or focal lesion. Sinuses/Orbits: No acute finding. Other: Mastoid air cells and middle ear cavities are clear. CT CERVICAL SPINE FINDINGS Alignment: Mild reversal of the normal cervical lordosis. No listhesis. Skull base and vertebrae: Craniocervical alignment is normal. The atlantodental interval is not widened. No acute fracture of the cervical spine. Vertebral body height is preserved. Soft tissues and spinal canal: No prevertebral fluid or swelling. No visible canal hematoma. Disc levels: Intervertebral disc space narrowing and endplate remodeling is seen at C5-C7, more severe at C5-6 in keeping with changes of moderate to severe degenerative disc disease. Prevertebral soft tissues are not thickened on sagittal reformats. Spinal canal is widely patent. No high-grade neuroforaminal narrowing identified. Upper chest: Negative. Other: None IMPRESSION: 1. No acute intracranial abnormality. No calvarial fracture. 2.  Progressive ventriculomegaly, disproportionate to the degree of parenchymal volume loss. While this may represent asymmetric central atrophy, changes of normal pressure hydrocephalus could appear similarly. 3. No acute fracture or listhesis of the cervical spine. Electronically Signed   By: Fidela Salisbury M.D.   On: 04/18/2022 00:53   CT Cervical Spine Wo Contrast  Result Date: 04/18/2022 CLINICAL DATA:  Mental status change, unknown cause; Neck trauma (Age >= 65y) EXAM: CT HEAD WITHOUT CONTRAST CT CERVICAL SPINE WITHOUT CONTRAST TECHNIQUE: Multidetector CT imaging of the head and cervical spine was performed following the standard protocol without intravenous contrast. Multiplanar CT image reconstructions of the cervical spine were also generated. RADIATION DOSE REDUCTION: This exam was performed according to the departmental dose-optimization program which includes automated exposure control, adjustment of the mA and/or kV according to patient size and/or use of iterative reconstruction technique. COMPARISON:  CT head 03/23/2017 FINDINGS: CT HEAD FINDINGS Brain: Moderate parenchymal volume loss is present, relatively advanced given the patient's age. Superimposed mild periventricular white matter changes are present, likely reflecting the sequela of small vessel ischemia. There is moderate ventriculomegaly which appears progressive since prior examination and appears disproportionate to the degree of parenchymal volume loss. While this may represent asymmetric central atrophy, changes of normal pressure hydrocephalus could appear similarly. No evidence of acute intracranial hemorrhage or infarct. No abnormal mass effect or midline shift. No abnormal intra or extra-axial mass lesion or fluid collection. Cerebellum unremarkable. Vascular: No hyperdense vessel or unexpected calcification. Skull: Normal. Negative for fracture or focal lesion. Sinuses/Orbits: No acute finding. Other: Mastoid air cells and middle  ear cavities are clear. CT CERVICAL SPINE FINDINGS Alignment: Mild reversal of the normal cervical lordosis. No listhesis. Skull base and vertebrae: Craniocervical alignment is normal. The atlantodental interval is not widened. No acute fracture of the cervical spine. Vertebral body height is preserved. Soft tissues and spinal canal: No prevertebral fluid or swelling. No visible canal hematoma. Disc levels: Intervertebral disc space narrowing and endplate remodeling is seen at C5-C7, more severe at C5-6 in keeping with changes of moderate to severe degenerative disc disease. Prevertebral soft tissues are not thickened on sagittal reformats. Spinal canal is widely patent. No high-grade neuroforaminal narrowing  identified. Upper chest: Negative. Other: None IMPRESSION: 1. No acute intracranial abnormality. No calvarial fracture. 2. Progressive ventriculomegaly, disproportionate to the degree of parenchymal volume loss. While this may represent asymmetric central atrophy, changes of normal pressure hydrocephalus could appear similarly. 3. No acute fracture or listhesis of the cervical spine. Electronically Signed   By: Fidela Salisbury M.D.   On: 04/18/2022 00:53   CT ABDOMEN PELVIS WO CONTRAST  Result Date: 04/17/2022 CLINICAL DATA:  Abdominal pain, acute, nonlocalized EXAM: CT ABDOMEN AND PELVIS WITHOUT CONTRAST TECHNIQUE: Multidetector CT imaging of the abdomen and pelvis was performed following the standard protocol without IV contrast. RADIATION DOSE REDUCTION: This exam was performed according to the departmental dose-optimization program which includes automated exposure control, adjustment of the mA and/or kV according to patient size and/or use of iterative reconstruction technique. COMPARISON:  None Available. FINDINGS: Lower chest: No acute findings.  Small hiatal hernia. Hepatobiliary: No focal hepatic abnormality. Gallbladder unremarkable. Pancreas: No focal abnormality or ductal dilatation. Spleen: No  focal abnormality.  Normal size. Adrenals/Urinary Tract: 2 mm nonobstructing stone in the lower pole of the left kidney. No ureteral stones or hydronephrosis. Adrenal glands and urinary bladder unremarkable. Stomach/Bowel: Normal appendix. Stomach, large and small bowel grossly unremarkable. Vascular/Lymphatic: Aortic atherosclerosis. No evidence of aneurysm or adenopathy. Reproductive: Uterus and adnexa unremarkable.  No mass. Other: No free fluid or free air. Musculoskeletal: No acute bony abnormality. IMPRESSION: No acute findings. Punctate left lower pole nephrolithiasis.  No hydronephrosis. Aortic atherosclerosis. Electronically Signed   By: Rolm Baptise M.D.   On: 04/17/2022 18:15      IMPRESSION AND PLAN:  Assessment and Plan: * Encephalopathy acute - This is concerning for alcoholic Wernicke's encephalopathy. - The patient will be admitted to a medical telemetry bed. - We will follow neurochecks every 4 hours for 24 hours. - She will be placed on a banana bag daily. - We will continue her on CIWA protocol for the possibility of expected alcohol withdrawal.  AKI (acute kidney injury) ([place]) - This is likely prerenal due to volume depletion and dehydration. - We will continue hydration with IV normal saline. - We will avoid nephrotoxins.  Alcohol abuse - Management as above.  Essential hypertension - We will place on as needed IV labetalol. - This trial is being held off given her acute kidney injury as well as her Lasix.  Type 1 diabetes mellitus with hyperglycemia (HCC) - The patient will be placed on supplement coverage with NovoLog and we will continue her basal coverage.  GERD without esophagitis - We will continue her PPI therapy.    DVT prophylaxis: Lovenox.  Advanced Care Planning:  Code Status: full code.  Family Communication:  The plan of care was discussed in details with the patient (and family). I answered all questions. The patient agreed to proceed with the  above mentioned plan. Further management will depend upon hospital course. Disposition Plan: Back to previous home environment Consults called: none.  All the records are reviewed and case discussed with ED provider.  Status is: Inpatient   At the time of the admission, it appears that the appropriate admission status for this patient is inpatient.  This is judged to be reasonable and necessary in order to provide the required intensity of service to ensure the patient's safety given the presenting symptoms, physical exam findings and initial radiographic and laboratory data in the context of comorbid conditions.  The patient requires inpatient status due to high intensity of service, high risk  of further deterioration and high frequency of surveillance required.  I certify that at the time of admission, it is my clinical judgment that the patient will require inpatient hospital care extending more than 2 midnights.                            Dispo: The patient is from: Home              Anticipated d/c is to: Home              Patient currently is not medically stable to d/c.              Difficult to place patient: No  Christel Mormon M.D on 04/18/2022 at 5:48 AM  [facility]   From 7 PM-7 AM, contact night-coverage [email or web address]  CC: Primary care physician; Pcp, No

## 2022-04-19 ENCOUNTER — Encounter: Payer: Self-pay | Admitting: Family Medicine

## 2022-04-19 DIAGNOSIS — N179 Acute kidney failure, unspecified: Secondary | ICD-10-CM | POA: Diagnosis not present

## 2022-04-19 DIAGNOSIS — N390 Urinary tract infection, site not specified: Secondary | ICD-10-CM | POA: Diagnosis not present

## 2022-04-19 DIAGNOSIS — E663 Overweight: Secondary | ICD-10-CM | POA: Diagnosis not present

## 2022-04-19 DIAGNOSIS — G9340 Encephalopathy, unspecified: Secondary | ICD-10-CM | POA: Diagnosis not present

## 2022-04-19 DIAGNOSIS — J40 Bronchitis, not specified as acute or chronic: Secondary | ICD-10-CM

## 2022-04-19 LAB — COMPREHENSIVE METABOLIC PANEL
ALT: 7 U/L (ref 0–44)
AST: 21 U/L (ref 15–41)
Albumin: 3.3 g/dL — ABNORMAL LOW (ref 3.5–5.0)
Alkaline Phosphatase: 40 U/L (ref 38–126)
Anion gap: 7 (ref 5–15)
BUN: 30 mg/dL — ABNORMAL HIGH (ref 8–23)
CO2: 21 mmol/L — ABNORMAL LOW (ref 22–32)
Calcium: 8.9 mg/dL (ref 8.9–10.3)
Chloride: 115 mmol/L — ABNORMAL HIGH (ref 98–111)
Creatinine, Ser: 2.03 mg/dL — ABNORMAL HIGH (ref 0.44–1.00)
GFR, Estimated: 26 mL/min — ABNORMAL LOW (ref >60)
Glucose, Bld: 146 mg/dL — ABNORMAL HIGH (ref 70–99)
Potassium: 4 mmol/L (ref 3.5–5.1)
Sodium: 143 mmol/L (ref 135–145)
Total Bilirubin: 0.6 mg/dL (ref 0.3–1.2)
Total Protein: 7.4 g/dL (ref 6.5–8.1)

## 2022-04-19 LAB — CBC
HCT: 30.4 % — ABNORMAL LOW (ref 36.0–46.0)
Hemoglobin: 10.1 g/dL — ABNORMAL LOW (ref 12.0–15.0)
MCH: 33.9 pg (ref 26.0–34.0)
MCHC: 33.2 g/dL (ref 30.0–36.0)
MCV: 102 fL — ABNORMAL HIGH (ref 80.0–100.0)
Platelets: 334 10*3/uL (ref 150–400)
RBC: 2.98 MIL/uL — ABNORMAL LOW (ref 3.87–5.11)
RDW: 12.4 % (ref 11.5–15.5)
WBC: 12.8 10*3/uL — ABNORMAL HIGH (ref 4.0–10.5)
nRBC: 0 % (ref 0.0–0.2)

## 2022-04-19 LAB — HEMOGLOBIN A1C
Hgb A1c MFr Bld: 7.2 % — ABNORMAL HIGH (ref 4.8–5.6)
Mean Plasma Glucose: 160 mg/dL

## 2022-04-19 LAB — PHOSPHORUS: Phosphorus: 2.6 mg/dL (ref 2.5–4.6)

## 2022-04-19 LAB — GLUCOSE, CAPILLARY
Glucose-Capillary: 138 mg/dL — ABNORMAL HIGH (ref 70–99)
Glucose-Capillary: 96 mg/dL (ref 70–99)
Glucose-Capillary: 81 mg/dL (ref 70–99)
Glucose-Capillary: 74 mg/dL (ref 70–99)

## 2022-04-19 LAB — MAGNESIUM: Magnesium: 1.4 mg/dL — ABNORMAL LOW (ref 1.7–2.4)

## 2022-04-19 LAB — PROCALCITONIN: Procalcitonin: 0.12 ng/mL

## 2022-04-19 MED ORDER — SODIUM CHLORIDE 0.9 % IV SOLN
500.0000 mg | INTRAVENOUS | Status: DC
  Administered 2022-04-20 – 2022-04-22 (×4): 500 mg via INTRAVENOUS
  Filled 2022-04-19: qty 5
  Filled 2022-04-19 (×2): qty 500
  Filled 2022-04-19: qty 5
  Filled 2022-04-19: qty 500

## 2022-04-19 MED ORDER — LORAZEPAM 1 MG PO TABS: 1.0000 mg | ORAL_TABLET | ORAL | Status: AC | PRN

## 2022-04-19 MED ORDER — ENOXAPARIN SODIUM 40 MG/0.4ML IJ SOSY
40.0000 mg | PREFILLED_SYRINGE | INTRAMUSCULAR | Status: DC
  Administered 2022-04-19 – 2022-05-06 (×17): 40 mg via SUBCUTANEOUS
  Filled 2022-04-19 (×17): qty 0.4

## 2022-04-19 MED ORDER — LORAZEPAM 2 MG/ML IJ SOLN
1.0000 mg | INTRAMUSCULAR | Status: AC | PRN
  Administered 2022-04-19: 3 mg via INTRAVENOUS
  Administered 2022-04-19 – 2022-04-20 (×3): 2 mg via INTRAVENOUS
  Administered 2022-04-20: 1 mg via INTRAVENOUS
  Administered 2022-04-20 – 2022-04-21 (×4): 2 mg via INTRAVENOUS
  Filled 2022-04-19 (×6): qty 1

## 2022-04-19 MED ORDER — SODIUM CHLORIDE 0.9 % IV SOLN
1.0000 g | INTRAVENOUS | Status: AC
  Administered 2022-04-19 – 2022-04-23 (×5): 1 g via INTRAVENOUS
  Filled 2022-04-19 (×2): qty 1
  Filled 2022-04-19: qty 10
  Filled 2022-04-19: qty 1
  Filled 2022-04-19: qty 10

## 2022-04-19 NOTE — Progress Notes (Signed)
PHARMACIST - PHYSICIAN COMMUNICATION  CONCERNING:  Enoxaparin (Lovenox) for DVT Prophylaxis    RECOMMENDATION: Patient was prescribed enoxaparin 30mg  q24 hours for VTE prophylaxis.   Filed Weights   04/17/22 1436  Weight: 93 kg (205 lb 0.4 oz)    Body mass index is 29.42 kg/m.  Estimated Creatinine Clearance: 33.2 mL/min (A) (by C-G formula based on SCr of 2.03 mg/dL (H)).  Patient is candidate for enoxaparin 40mg  every 24 hours based on CrCl >69ml/min and Weight >45kg  DESCRIPTION: Pharmacy has adjusted enoxaparin dose per [facility] policy.  Patient is now receiving enoxaparin 40 mg every 24 hours    Benita Gutter 04/19/2022 7:20 AM

## 2022-04-19 NOTE — Hospital Course (Addendum)
29yof w/ diabetes mellitus type 1, stage IIIa chronic kidney disease, hypertension and ongoing alcohol use who was brought in for worsening confusion for the last month.  Workup revealed urinary tract infection, acute kidney injury, hypomagnesemia and hyperkalemia.  MRI unremarkable for CVA. On evening of 12/30, patient started becoming slightly more agitated requiring Ativan.  As 12/31 progressed, patient became even more agitated despite Ativan.  Discussed with critical care.  Patient transferred to critical care service to the ICU and started on Precedex drip.  With worsening agitation, patient intubated on 1/1.  By 1/3, able to be weaned off of Precedex and able to be extubated on 1/4.  Patient also with Klebsiella UTI, treated with course of Rocephin.  [facility] assumed care 1/5 and patient started on IV antibiotics for aspiration pneumonia.  Mentation waxing and waning and unable to clear swallow evaluation.  Patient currently on tube feeds.further discussion was had w/ family and proceeding with PEG tube placement.  1/8: Mild tachycardia and low 100s, labs with improvement in leukocytosis.  BMP with slight worsening of creatinine to 1.53.  Increasing free water intake through NG tube.  CBG remained elevated, adding NovoLog 7 unit every 4 hourly if continued to receive tube feeding, discontinued later as patient developed some hypoglycemia. Patient had some nausea and vomiting so tube feed is currently being held. Going for PEG tube placement tomorrow.  1/9: Patient had her PEG tube placed today by IR.  Remained quite encephalopathic and not following any commands.  She did received high-dose thiamine for concern of Wernicke's encephalopathy during current hospitalization.  Imaging was negative.  Did received multiple courses of antibiotics for concern of bronchitis followed by UTI followed by aspiration pneumonia. Consulted neurology for persistent encephalopathy. PEG tube will be ready for  medications around 6 PM and to start feed tomorrow morning.  1/10: Patient more alert and able to communicate to some extent.  Not following commands very consistently.  Still unable to void after removing Foley catheter yesterday afternoon.  Requiring in and out catheter x 1 overnight and the second time this morning.  We will try to encourage voiding and if still unable to void then might need long term Foley catheter. Tube feeding was resumed through PEG tube today. Concern of Wernicke's/Korsakoff syndrome, might have some irreversible damage due to excessive alcohol use.  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  Neurology is recommending checking B12 and to keep it above 400.  They also ordered routine EEG although there was no underlying seizure-like activity noted. Ordered a.m. labs with anemia panel.  1/11: Vital stable labs with some decrease in leukocytosis to 12.9 and hemoglobin decreased to 9.2, all cell lines decreased, most likely some dilutional effect.  Slight worsening of creatinine to 1.76, ordered renal ultrasound and it was unremarkable.  Anemia panel with normal ferritin, folate and iron studies consistent with anemia of chronic disease with mildly low iron.  B12 and vitamin D Hypomagnesemia with magnesium of 1.6 which is being repleted Will complete the course of antibiotic today for aspiration pneumonia. Foley catheter was replaced overnight due to inability to void and requiring multiple in and out catheterization. EEG with diffuse encephalopathy and no concern of seizures.  1/12:Vital stable, B12 at 251, goal above 400, ordered 3 doses of IM B12 1000 mg, followed by daily p.o. vitamin D levels pending.  Going for barium  swallow studies today.  No change in mental status.  Most likely has progressed to reversible damage with Wernicke's/Korsakoff  syndrome.  1/13: Hemodynamically stable.  Barium swallow studies with very decreased response and unable to follow commands to swallow.  Keeping bolus in mouth, able to hold cup but unable to drink from it.  Swallow team is recommending just ice chips at this time to keep stimulating oral mucosa.  Patient will also need speech and swallow therapy given go to rehab. CBG elevated-added NovoLog 4 units every 6 hourly while she is on tube feed. Had 1 episode of loose bowel movement-if developed diarrhea with multiple bowel movements, might need a change in tube feed.  1/14: CBG improving but still elevated, increasing NovoLog to 5 units every 6 hourly.  No other acute concerns.  Still no bed offers.  TOC expanded the search.  1/15: CBG elevated this morning, borderline low around midnight.  Increasing NovoLog to every 4 hourly as recommended by diabetes coordinator.  Not had a bed offer-pending insurance authorization.  Patient will be going with Foley catheter and outpatient urology evaluation is recommended.  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 Patient is being discharged with PEG tube and tube feeding.  She will need continuation of swallow and speech therapy at facility to see if we can facilitate p.o. intake.  She is currently on ice chips to stimulate swallowing.  She is being discharged on 5 units of NovoLog with each tube feed while taking tube feed, please stop NovoLog if not taking tube feed.  Patient will need regular CBG monitoring to adjust insulin dose.  She will also need dietitian help at facility to monitor tube feed and make changes as appropriate.  She will continue on current medications which include Q00, B1 and folic acid supplement along with multivitamin.  Patient need rehab to improve mobility and coordination and for which she is being discharged to SNF for further management.  Patient will need to follow-up with  neurology, primary care provider and urology for further management.  Chances of much significant meaningful recovery are slim at this time.  Goal is to make her little independent with her rehab so she can return home with help.

## 2022-04-19 NOTE — Assessment & Plan Note (Addendum)
Completed course of antibiotics to Rocephin and Zithromax

## 2022-04-19 NOTE — Assessment & Plan Note (Addendum)
Urine cultures growing out pansensitive Klebsiella.  Completed course of Rocephin

## 2022-04-19 NOTE — Progress Notes (Signed)
Unable to complete yale swallow at this time pt not following commands

## 2022-04-19 NOTE — Progress Notes (Signed)
[facility] Progress Note  Patient: Laurie Lin    HBZ:[id number]  DOA: 04/17/2022    Date of Service: the patient was seen and examined on 04/19/2022  Brief hospital course: 66 year old female with past medical history of diabetes mellitus type 1, stage IIIa chronic kidney disease, hypertension and ongoing alcohol use who was brought in for worsening confusion for the last month.  Workup revealed urinary tract infection, acute kidney injury, hypomagnesemia and hyperkalemia.  MRI unremarkable for CVA.   Assessment and Plan: * Encephalopathy acute - This is concerning for alcoholic Wernicke's encephalopathy. - The patient will be admitted to a medical telemetry bed.  Will try to treat underlying issues of AKI, bronchitis and UTI.  MRI ruled out CVA, but does note significant wear and tear disproportionate for patient's age.  Discussed with the patient's husband and he understands.  If patient not improved, husband is interested in a long-term care facility.  AKI (acute kidney injury) ([place]) - This is likely prerenal due to volume depletion and dehydration.  Improving albeit slowly, in part due to low albumin.  Alcohol abuse According to husband, patient drinks 7-8 beers per day and has not had any alcohol in at least several days before coming in.  On CIWA protocol.  Being monitored for withdrawals.  Type 1 diabetes mellitus with hyperglycemia (HCC) - The patient will be placed on supplement coverage with NovoLog and we will continue her basal coverage.  A1c notes moderate control at 7.2, although I suspect some of her overall " controlled blood sugars" are due to poor calorie intake secondary to alcoholism.  CBGs initially in the 3-4 100s.  Not in DKA.  Appreciate diabetes coordinator help.  Now with sliding scale and subcu insulin.  Bronchitis Change antibiotics to Rocephin and Zithromax to cover  UTI (urinary tract infection) Noted on admission.  Change antibiotics to  Rocephin  Essential hypertension - We will place on as needed IV labetalol. - This trial is being held off given her acute kidney injury as well as her Lasix.  Blood pressure little better controlled today.  Check BNP looking for cardiomyopathy  GERD without esophagitis - We will continue her PPI therapy.  Overweight (BMI 25.0-29.9) Meets criteria for BMI greater than 25       Body mass index is 29.42 kg/m.        Consultants: None  Procedures: None  Antimicrobials: IV Rocephin and Zithromax 12/30-present IV doxycycline 12/29 - 12/30  Code Status: Full code   Subjective: Patient confused, not able to really give me any subjective feedback  Objective: Vital signs were reviewed and unremarkable. Vitals:   04/19/22 0751 04/19/22 1204  BP: (!) 164/79 132/64  Pulse: 87 79  Resp: 17 17  Temp: (!) 96 F (35.6 C) 98.4 F (36.9 C)  SpO2: 97% 94%    Intake/Output Summary (Last 24 hours) at 04/19/2022 1716 Last data filed at 04/19/2022 1519 Gross per 24 hour  Intake 3616.58 ml  Output --  Net 3616.58 ml   Filed Weights   04/17/22 1436  Weight: 93 kg   Body mass index is 29.42 kg/m.  Exam:  General: Oriented x 1 HEENT: Poor dentition, normocephalic, atraumatic, mucous membranes are slightly dry Cardiovascular: Regular rate and rhythm, S1-S2 Respiratory: Clear to auscultation bilaterally Abdomen: Soft, nontender, nondistended, hypoactive bowel sounds Musculoskeletal: No clubbing or cyanosis, trace pitting edema Skin: No skin breaks, tears or lesions Psychiatry: Confused, acutely delirious Neurology: No focal deficits  Data Reviewed: Creatinine at  2.03, magnesium of 1.4, procalcitonin of 0.12.  White blood cell count of 12.8.  Disposition:  Status is: Inpatient Remains inpatient appropriate because:  -Improvement in creatinine -Treatment and infection -Hopefully improvement in mentation and if not, determination of disposition    Anticipated  discharge date: 1/2  Family Communication: Husband at the bedside DVT Prophylaxis: enoxaparin (LOVENOX) injection 40 mg Start: 04/19/22 1000    Author: Annita Brod ,MD 04/19/2022 5:16 PM  To reach On-call, see care teams to locate the attending and reach out via [email or web address]. Between 7PM-7AM, please contact night-coverage If you still have difficulty reaching the attending provider, please page the [facility] (Director on Call) for [facility] on amion for assistance.

## 2022-04-19 NOTE — Progress Notes (Signed)
Patient arrived to unit from ER.

## 2022-04-19 NOTE — Assessment & Plan Note (Signed)
Meets criteria for BMI greater than 25 

## 2022-04-20 ENCOUNTER — Inpatient Hospital Stay
Admit: 2022-04-20 | Discharge: 2022-04-20 | Disposition: A | Discharge status: Home / Self Care | Payer: Medicare Other | Attending: Internal Medicine | Admitting: Internal Medicine

## 2022-04-20 DIAGNOSIS — I10 Essential (primary) hypertension: Secondary | ICD-10-CM | POA: Diagnosis not present

## 2022-04-20 DIAGNOSIS — N179 Acute kidney failure, unspecified: Secondary | ICD-10-CM | POA: Diagnosis not present

## 2022-04-20 DIAGNOSIS — E870 Hyperosmolality and hypernatremia: Secondary | ICD-10-CM

## 2022-04-20 DIAGNOSIS — B9689 Other specified bacterial agents as the cause of diseases classified elsewhere: Secondary | ICD-10-CM

## 2022-04-20 DIAGNOSIS — G9340 Encephalopathy, unspecified: Secondary | ICD-10-CM | POA: Diagnosis not present

## 2022-04-20 DIAGNOSIS — N390 Urinary tract infection, site not specified: Secondary | ICD-10-CM | POA: Diagnosis not present

## 2022-04-20 LAB — BASIC METABOLIC PANEL
Anion gap: 10 (ref 5–15)
BUN: 24 mg/dL — ABNORMAL HIGH (ref 8–23)
CO2: 22 mmol/L (ref 22–32)
Calcium: 8.7 mg/dL — ABNORMAL LOW (ref 8.9–10.3)
Chloride: 115 mmol/L — ABNORMAL HIGH (ref 98–111)
Creatinine, Ser: 1.54 mg/dL — ABNORMAL HIGH (ref 0.44–1.00)
GFR, Estimated: 37 mL/min — ABNORMAL LOW (ref >60)
Glucose, Bld: 121 mg/dL — ABNORMAL HIGH (ref 70–99)
Potassium: 3.7 mmol/L (ref 3.5–5.1)
Sodium: 147 mmol/L — ABNORMAL HIGH (ref 135–145)

## 2022-04-20 LAB — CBC
HCT: 32.5 % — ABNORMAL LOW (ref 36.0–46.0)
Hemoglobin: 10.6 g/dL — ABNORMAL LOW (ref 12.0–15.0)
MCH: 33.4 pg (ref 26.0–34.0)
MCHC: 32.6 g/dL (ref 30.0–36.0)
MCV: 102.5 fL — ABNORMAL HIGH (ref 80.0–100.0)
Platelets: 356 10*3/uL (ref 150–400)
RBC: 3.17 MIL/uL — ABNORMAL LOW (ref 3.87–5.11)
RDW: 12.4 % (ref 11.5–15.5)
WBC: 11.5 10*3/uL — ABNORMAL HIGH (ref 4.0–10.5)
nRBC: 0 % (ref 0.0–0.2)

## 2022-04-20 LAB — GLUCOSE, CAPILLARY
Glucose-Capillary: 121 mg/dL — ABNORMAL HIGH (ref 70–99)
Glucose-Capillary: 143 mg/dL — ABNORMAL HIGH (ref 70–99)
Glucose-Capillary: 140 mg/dL — ABNORMAL HIGH (ref 70–99)
Glucose-Capillary: 82 mg/dL (ref 70–99)
Glucose-Capillary: 70 mg/dL (ref 70–99)
Glucose-Capillary: 133 mg/dL — ABNORMAL HIGH (ref 70–99)
Glucose-Capillary: 116 mg/dL — ABNORMAL HIGH (ref 70–99)

## 2022-04-20 LAB — URINE CULTURE: Culture: >=100000 — AB

## 2022-04-20 LAB — BLOOD GAS, ARTERIAL
Acid-base deficit: 1.6 mmol/L (ref 0.0–2.0)
Bicarbonate: 21.5 mmol/L (ref 20.0–28.0)
O2 Content: 3 L/min
O2 Saturation: 99.3 %
Patient temperature: 38.6
pCO2 arterial: 33 mmHg (ref 32–48)
pH, Arterial: 7.43 (ref 7.35–7.45)
pO2, Arterial: 109 mmHg — ABNORMAL HIGH (ref 83–108)

## 2022-04-20 LAB — BRAIN NATRIURETIC PEPTIDE: B Natriuretic Peptide: 356.5 pg/mL — ABNORMAL HIGH (ref 0.0–100.0)

## 2022-04-20 LAB — MRSA NEXT GEN BY PCR, NASAL: MRSA by PCR Next Gen: NOT DETECTED

## 2022-04-20 LAB — FOLATE: Folate: 3.4 ng/mL — ABNORMAL LOW (ref >5.9)

## 2022-04-20 MED ORDER — LABETALOL HCL 5 MG/ML IV SOLN
10.0000 mg | INTRAVENOUS | Status: DC | PRN
  Administered 2022-04-21 – 2022-04-26 (×3): 10 mg via INTRAVENOUS
  Filled 2022-04-20 (×3): qty 4

## 2022-04-20 MED ORDER — CHLORHEXIDINE GLUCONATE CLOTH 2 % EX PADS
6.0000 | MEDICATED_PAD | Freq: Every day | CUTANEOUS | Status: DC
  Administered 2022-04-20 – 2022-05-06 (×17): 6 via TOPICAL

## 2022-04-20 MED ORDER — METOPROLOL TARTRATE 5 MG/5ML IV SOLN
5.0000 mg | Freq: Once | INTRAVENOUS | Status: AC
  Administered 2022-04-20: 5 mg via INTRAVENOUS
  Filled 2022-04-20: qty 5

## 2022-04-20 MED ORDER — DEXMEDETOMIDINE HCL IN NACL 200 MCG/50ML IV SOLN
0.0000 ug/kg/h | INTRAVENOUS | Status: DC
  Administered 2022-04-22: 0.4 ug/kg/h via INTRAVENOUS
  Filled 2022-04-20 (×2): qty 50

## 2022-04-20 MED ORDER — SODIUM CHLORIDE 0.45 % IV SOLN: INTRAVENOUS | Status: DC

## 2022-04-20 MED ORDER — DEXTROSE 50 % IV SOLN
12.5000 g | INTRAVENOUS | Status: AC
  Administered 2022-04-20: 12.5 g via INTRAVENOUS

## 2022-04-20 MED ORDER — DEXTROSE 50 % IV SOLN
INTRAVENOUS | Status: AC
  Filled 2022-04-20: qty 50

## 2022-04-20 NOTE — Progress Notes (Signed)
Patient responds to pain only, unable to follow commands and has visible tremors. ST on the monitor and temp is 100.3. BG is 70 and dextrose 12.5g iv given.

## 2022-04-20 NOTE — Progress Notes (Signed)
Patient was a yellow mews d/t HR and she has been scoring higher on CIWA and has been getting Ativan most of the day, she is having alcohol withdrawals. One hour later became a red mews. Patient having tremors and sweating (beads). Messaged MD and made rapid response ICU nurse aware of same. ICU nurse and ICU attending assessed at bedside and decision made to transfer to ICU.

## 2022-04-20 NOTE — Progress Notes (Signed)
   04/20/22 1513  Assess: MEWS Score  Temp 100.1 F (37.8 C)  BP (!) 158/78  MAP (mmHg) 100  Pulse Rate (!) 114  Resp 20  SpO2 98 %  O2 Device Nasal Cannula  O2 Flow Rate (L/min) 2 L/min  Assess: MEWS Score  MEWS Temp 0  MEWS Systolic 0  MEWS Pulse 2  MEWS RR 0  MEWS LOC 0  MEWS Score 2  MEWS Score Color Yellow  Assess: if the MEWS score is Yellow or Red  Were vital signs taken at a resting state? Yes  Focused Assessment Change from prior assessment (see assessment flowsheet)  Does the patient meet 2 or more of the SIRS criteria? No  MEWS guidelines implemented *See Row Information* Yes  Treat  Pain Scale 0-10  Pain Score Asleep  Take Vital Signs  Increase Vital Sign Frequency  Yellow: Q 2hr X 2 then Q 4hr X 2, if remains yellow, continue Q 4hrs  Escalate  MEWS: Escalate Yellow: discuss with charge nurse/RN and consider discussing with provider and RRT  Notify: Charge Nurse/RN  Name of Charge Nurse/RN Notified Montey Hora, RN  Date Charge Nurse/RN Notified 04/20/22  Time Charge Nurse/RN Notified 1514  Document  Patient Outcome Other (Comment) (Gave PRN CIWA Ativan)  Progress note created (see row info) Yes  Assess: SIRS CRITERIA  SIRS Temperature  0  SIRS Pulse 1  SIRS Respirations  0  SIRS WBC 0  SIRS Score Sum  1

## 2022-04-20 NOTE — Progress Notes (Addendum)
[facility] Progress Note  Patient: Laurie Lin    XBD:[id number]  DOA: 04/17/2022    Date of Service: the patient was seen and examined on 04/20/2022  Brief hospital course: 66 year old female with past medical history of diabetes mellitus type 1, stage IIIa chronic kidney disease, hypertension and ongoing alcohol use who was brought in for worsening confusion for the last month.  Workup revealed urinary tract infection, acute kidney injury, hypomagnesemia and hyperkalemia.  MRI unremarkable for CVA.   Assessment and Plan: * Encephalopathy acute - This is concerning for alcoholic Wernicke's encephalopathy. - The patient will be admitted to a medical telemetry bed.  Will try to treat underlying issues of AKI, bronchitis and UTI.  MRI ruled out CVA, but does note significant wear and tear disproportionate for patient's age.  Discussed with the patient's husband and he understands.  If patient not improved, husband is interested in a long-term care facility.  AKI (acute kidney injury) ([place]) - This is likely prerenal due to volume depletion and dehydration.  Improving albeit slowly, in part due to low albumin.  Alcohol abuse According to husband, patient drinks 7-8 beers per day and has not had any alcohol in at least several days before coming in.  On CIWA protocol.  Being monitored for withdrawals.  Blood pressures are trending up slightly as well as mild tachycardia.  Type 1 diabetes mellitus with hyperglycemia (HCC) - The patient will be placed on supplement coverage with NovoLog and we will continue her basal coverage.  A1c notes moderate control at 7.2, although I suspect some of her overall " controlled blood sugars" are due to poor calorie intake secondary to alcoholism.  CBGs initially in the 3-4 100s.  Not in DKA.  Appreciate diabetes coordinator help.  Now with sliding scale and subcu insulin.  Bronchitis Change antibiotics to Rocephin and Zithromax to cover  UTI due to  Klebsiella species Urine cultures growing out pansensitive Klebsiella.  Continue Rocephin  Essential hypertension - We will place on as needed IV labetalol.  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  BNP elevated at 356 and blood pressures are starting to trend upward, either from withdrawal or from fluids.  Echocardiogram results pending.  GERD without esophagitis - We will continue her PPI therapy.  Overweight (BMI 25.0-29.9) Meets criteria for BMI greater than 25  Hypernatremia Secondary to lack of p.o. intake while she is confused.  Change IV fluids to half-normal saline.       Body mass index is 29.42 kg/m.        Consultants: None  Procedures: Echocardiogram: Results pending  Antimicrobials: IV Rocephin and Zithromax 12/30-present IV doxycycline 12/29 - 12/30  Code Status: Full code   Subjective: Somnolent  Objective: Vital signs were reviewed and unremarkable. Vitals:   04/20/22 0756 04/20/22 1129  BP: (!) 160/87 (!) 159/79  Pulse: (!) 110 100  Resp: 20 20  Temp: 98.8 F (37.1 C) 98.6 F (37 C)  SpO2: 97% 100%    Intake/Output Summary (Last 24 hours) at 04/20/2022 1316 Last data filed at 04/20/2022 1005 Gross per 24 hour  Intake 1304.78 ml  Output --  Net 1304.78 ml   Filed Weights   04/17/22 1436  Weight: 93 kg   Body mass index is 29.42 kg/m.  Exam:  General: Somnolent HEENT: Poor dentition, normocephalic, atraumatic, mucous membranes are slightly dry Cardiovascular: Regular rate and rhythm, S1-S2 Respiratory: Clear to auscultation bilaterally Abdomen: Soft, nontender, nondistended, hypoactive bowel sounds Musculoskeletal: No clubbing or  cyanosis, trace pitting edema Skin: No skin breaks, tears or lesions Psychiatry: Currently somnolent Neurology: No focal deficits  Data Reviewed: BNP of 356.  Creatinine 1.5.  Disposition:  Status is: Inpatient Remains inpatient appropriate because:  -Improvement in  creatinine -Evaluation of heart failure and diuresing -Treatment of infection -Hopefully improvement in mentation and if not, determination of disposition    Anticipated discharge date: 1/3  Family Communication: Will call husband DVT Prophylaxis: enoxaparin (LOVENOX) injection 40 mg Start: 04/19/22 1000    Author: Annita Brod ,MD 04/20/2022 1:16 PM  To reach On-call, see care teams to locate the attending and reach out via [email or web address]. Between 7PM-7AM, please contact night-coverage If you still have difficulty reaching the attending provider, please page the [facility] (Director on Call) for [facility] on amion for assistance.

## 2022-04-20 NOTE — Progress Notes (Incomplete)
1641 Patient received from floor soaked in urine and unresponsive. Respirations irregular with brief periods of  apneas. Left AC IV leaking . Patient stuck 6 times for another IV.

## 2022-04-20 NOTE — Progress Notes (Signed)
*  PRELIMINARY RESULTS* Echocardiogram 2D Echocardiogram has been performed.  Laurie Lin 04/20/2022, 1:29 PM

## 2022-04-20 NOTE — Consult Note (Signed)
CRITICAL CARE PROGRESS NOTE    Name: Laurie Lin MRN: [id number] DOB: 1955-09-22     LOS: 2   SUBJECTIVE FINDINGS & SIGNIFICANT EVENTS    HPI: This is a 66 yo F with hx of alcoholism, she drinks several beers and liquor drinks daily chronically.  She also has background of HTN, DM1, CKD3, who came in with severe hyperglycemia, AMS encephalopathy, noted to have hematemesis at home and has dry blood in mouth during my examination. She has been on the medical floor with treatment for past 3 days.  She has been receiving therapy for DM, UTI due to klebsiella, Wernickes encephalopathy, AKI, and bronchitis.  While on floor she continued to deteriorate and has become unresponsive with jerky movements. PCCM consultation for concern patient developing Delerium Tremens.    Lines/tubes : External Urinary Catheter (Active)  Collection Container Dedicated Suction Canister 04/20/22 0800  Suction (Verified suction is between 40-80 mmHg) Yes 04/20/22 0800  Securement Method None needed 04/20/22 0800  Site Assessment Clean, Dry, Intact 04/20/22 0800  Intervention Female External Urinary Catheter Replaced 04/20/22 0800    Microbiology/Sepsis markers: Results for orders placed or performed during the hospital encounter of 04/17/22  Urine Culture     Status: Abnormal   Collection Time: 04/18/22  2:00 PM   Specimen: Urine, Clean Catch  Result Value Ref Range Status   Specimen Description   Final    URINE, CLEAN CATCH Performed at [facility], [address], [place], [place] [address]    Special Requests   Final    NONE Performed at [facility], [address] [place], [place] [address]    Culture >=100,000 COLONIES/mL KLEBSIELLA PNEUMONIAE (A)  Final   Report Status 04/20/2022 FINAL  Final    Organism ID, Bacteria KLEBSIELLA PNEUMONIAE (A)  Final      Susceptibility   Klebsiella pneumoniae - MIC*    AMPICILLIN RESISTANT Resistant     CEFAZOLIN <=4 SENSITIVE Sensitive     CEFEPIME <=0.12 SENSITIVE Sensitive     CEFTRIAXONE <=0.25 SENSITIVE Sensitive     CIPROFLOXACIN <=0.25 SENSITIVE Sensitive     GENTAMICIN <=1 SENSITIVE Sensitive     IMIPENEM <=0.25 SENSITIVE Sensitive     NITROFURANTOIN <=16 SENSITIVE Sensitive     TRIMETH/SULFA <=20 SENSITIVE Sensitive     AMPICILLIN/SULBACTAM <=2 SENSITIVE Sensitive     PIP/TAZO <=4 SENSITIVE Sensitive     * >=100,000 COLONIES/mL KLEBSIELLA PNEUMONIAE    Anti-infectives:  Anti-infectives (From admission, onward)    Start     Dose/Rate Route Frequency Ordered Stop   04/19/22 1815  cefTRIAXone (ROCEPHIN) 1 g in sodium chloride 0.9 % 100 mL IVPB        1 g 200 mL/hr over 30 Minutes Intravenous Every 24 hours 04/19/22 1715     04/19/22 1815  azithromycin (ZITHROMAX) 500 mg in sodium chloride 0.9 % 250 mL IVPB        500 mg 250 mL/hr over 60 Minutes Intravenous Every 24 hours 04/19/22 1715     04/18/22 1500  doxycycline (VIBRAMYCIN) 100 mg in sodium chloride 0.9 % 250 mL IVPB  Status:  Discontinued        100 mg 125 mL/hr over 120 Minutes Intravenous Every 12 hours 04/18/22 1344 04/19/22 1715        Consults: Treatment Team:  Annita Brod, MD    PAST MEDICAL HISTORY   Past Medical History:  Diagnosis Date   Diabetes mellitus without complication ([place])  GERD (gastroesophageal reflux disease)    Hypertension    Renal disorder    stage 3      SURGICAL HISTORY   Past Surgical History:  Procedure Laterality Date   DILATION AND CURETTAGE OF UTERUS     Miscarriage   EYE SURGERY     bilateral caterac   FINGER SURGERY     KNEE ARTHROSCOPY Left 1989   [facility]   PILONIDAL CYST DRAINAGE       FAMILY HISTORY   Family History  Problem Relation Age of Onset   Diabetes Mother      SOCIAL HISTORY   Social  History   Tobacco Use   Smoking status: Never   Smokeless tobacco: Never  Vaping Use   Vaping Use: Never used  Substance Use Topics   Alcohol use: Yes    Alcohol/week: 6.0 standard drinks of alcohol    Types: 3 Cans of beer, 3 Shots of liquor per week    Comment: heavy drinker   Drug use: No     MEDICATIONS   Current Medication:  Current Facility-Administered Medications:    0.45 % sodium chloride infusion, , Intravenous, Continuous, Annita Brod, MD, Last Rate: 100 mL/hr at 04/20/22 1005, Infusion Verify at 04/20/22 1005   acetaminophen (TYLENOL) tablet 650 mg, 650 mg, Oral, Q6H PRN **OR** acetaminophen (TYLENOL) suppository 650 mg, 650 mg, Rectal, Q6H PRN, Mansy, Jan A, MD   azithromycin (ZITHROMAX) 500 mg in sodium chloride 0.9 % 250 mL IVPB, 500 mg, Intravenous, Q24H, Annita Brod, MD, Stopped at 04/20/22 0046   cefTRIAXone (ROCEPHIN) 1 g in sodium chloride 0.9 % 100 mL IVPB, 1 g, Intravenous, Q24H, Annita Brod, MD, Stopped at 04/20/22 0003   dexmedetomidine (PRECEDEX) 200 MCG/50ML (4 mcg/mL) infusion, 0-1.2 mcg/kg/hr, Intravenous, Titrated, [place] Pink, Trenton Gammon, MD   enoxaparin (LOVENOX) injection 40 mg, 40 mg, Subcutaneous, Q24H, Benita Gutter, RPH, 40 mg at 25/95/63 8756   folic acid (FOLVITE) tablet 1 mg, 1 mg, Oral, Once, Renda Rolls, RPH   HYDROcodone-acetaminophen (NORCO/VICODIN) 5-325 MG per tablet 1 tablet, 1 tablet, Oral, Q6H PRN, Mansy, Jan A, MD   insulin aspart (novoLOG) injection 0-9 Units, 0-9 Units, Subcutaneous, TID WC, Hosie Poisson, MD, 1 Units at 04/20/22 1213   insulin glargine-yfgn (SEMGLEE) injection 12 Units, 12 Units, Subcutaneous, Daily, Hosie Poisson, MD, 12 Units at 04/20/22 0850   LORazepam (ATIVAN) injection 0-4 mg, 0-4 mg, Intravenous, Q12H **OR** LORazepam (ATIVAN) tablet 0-4 mg, 0-4 mg, Oral, Q12H, Ward, Kristen N, DO   LORazepam (ATIVAN) tablet 1-4 mg, 1-4 mg, Oral, Q1H PRN **OR** LORazepam (ATIVAN) injection 1-4 mg, 1-4  mg, Intravenous, Q1H PRN, Sharion Settler, NP, 2 mg at 04/20/22 1625   magnesium hydroxide (MILK OF MAGNESIA) suspension 30 mL, 30 mL, Oral, Daily PRN, Mansy, Jan A, MD   magnesium sulfate IVPB 2 g 50 mL, 2 g, Intravenous, Once, Mansy, Jan A, MD   metoCLOPramide (REGLAN) injection 5 mg, 5 mg, Intravenous, Q8H, Hosie Poisson, MD, 5 mg at 04/20/22 1346   multivitamin with minerals tablet 1 tablet, 1 tablet, Oral, Daily, Belue, Nathan S, RPH   ondansetron (ZOFRAN) tablet 4 mg, 4 mg, Oral, Q6H PRN **OR** ondansetron (ZOFRAN) injection 4 mg, 4 mg, Intravenous, Q6H PRN, Mansy, Jan A, MD   pantoprazole (PROTONIX) injection 40 mg, 40 mg, Intravenous, Q24H, Karleen Hampshire, Vijaya, MD, 40 mg at 04/20/22 0840   promethazine (PHENERGAN) 12.5 mg in sodium chloride 0.9 % 50 mL IVPB, 12.5 mg, Intravenous, Q6H  PRN, Hosie Poisson, MD   thiamine (VITAMIN B1) tablet 100 mg, 100 mg, Oral, Daily **OR** thiamine (VITAMIN B1) injection 100 mg, 100 mg, Intravenous, Daily, Renda Rolls, RPH, 100 mg at 04/20/22 4332   tiZANidine (ZANAFLEX) tablet 4 mg, 4 mg, Oral, TID PRN, Mansy, Jan A, MD   traZODone (DESYREL) tablet 25 mg, 25 mg, Oral, QHS PRN, Mansy, Jan A, MD    ALLERGIES   Ibuprofen, Nsaids, Brimonidine, and Latex    REVIEW OF SYSTEMS    Unable to obtain due to unresponsive mental status  PHYSICAL EXAMINATION   Vital Signs: Temp:  [97.8 F (36.6 C)-100.7 F (38.2 C)] 100.7 F (38.2 C) (12/31 1616) Pulse Rate:  [100-125] 111 (12/31 1616) Resp:  [17-24] 24 (12/31 1616) BP: (143-170)/(73-112) 156/78 (12/31 1616) SpO2:  [97 %-100 %] 98 % (12/31 1616)  GENERAL: unresponsive age appropriate HEAD: Normocephalic, atraumatic.  EYES: Pupils equal, round, reactive to light.  No scleral icterus.  MOUTH: Dry mucosal membranes with dark dry blood in oral cavity NECK: Supple. No thyromegaly. No nodules. No JVD.  PULMONARY: rhonchi bilaterally  CARDIOVASCULAR: S1 and S2. Regular rate and rhythm. No murmurs, rubs,  or gallops.  GASTROINTESTINAL: Soft, nontender, non-distended. No masses. Positive bowel sounds. No hepatosplenomegaly.  MUSCULOSKELETAL: No swelling, clubbing, or edema.  NEUROLOGIC: GCS7 with jerky muscular movements SKIN:intact,warm,dry   PERTINENT DATA     Infusions:  sodium chloride 100 mL/hr at 04/20/22 1005   azithromycin Stopped (04/20/22 0046)   cefTRIAXone (ROCEPHIN)  IV Stopped (04/20/22 0003)   dexmedetomidine (PRECEDEX) IV infusion     magnesium sulfate bolus IVPB     promethazine (PHENERGAN) injection (IM or IVPB)     Scheduled Medications:  enoxaparin (LOVENOX) injection  40 mg Subcutaneous R51O   folic acid  1 mg Oral Once   insulin aspart  0-9 Units Subcutaneous TID WC   insulin glargine-yfgn  12 Units Subcutaneous Daily   LORazepam  0-4 mg Intravenous Q12H   Or   LORazepam  0-4 mg Oral Q12H   metoCLOPramide (REGLAN) injection  5 mg Intravenous Q8H   multivitamin with minerals  1 tablet Oral Daily   pantoprazole (PROTONIX) IV  40 mg Intravenous Q24H   thiamine  100 mg Oral Daily   Or   thiamine  100 mg Intravenous Daily   PRN Medications: acetaminophen **OR** acetaminophen, HYDROcodone-acetaminophen, LORazepam **OR** LORazepam, magnesium hydroxide, ondansetron **OR** ondansetron (ZOFRAN) IV, promethazine (PHENERGAN) injection (IM or IVPB), tiZANidine, traZODone Hemodynamic parameters:   Intake/Output: 12/30 0701 - 12/31 0700 In: 2117.6 [I.V.:2095.7; IV Piggyback:22] Out: -   Ventilator  Settings:     LAB RESULTS:  Basic Metabolic Panel: Recent Labs  Lab 04/17/22 1445 04/18/22 0044 04/18/22 0931 04/19/22 0435 04/20/22 0539  NA 136  --  137 143 147*  K 5.4* 4.7 4.8 4.0 3.7  CL 101  --  104 115* 115*  CO2 20*  --  18* 21* 22  GLUCOSE 306*  --  458* 146* 121*  BUN 26*  --  33* 30* 24*  CREATININE 2.34*  --  2.74* 2.03* 1.54*  CALCIUM 10.2  --  9.3 8.9 8.7*  MG  --  1.4*  --  1.4*  --   PHOS  --   --   --  2.6  --    Liver Function  Tests: Recent Labs  Lab 04/17/22 1445 04/19/22 0435  AST 29 21  ALT 13 7  ALKPHOS 52 40  BILITOT 1.8* 0.6  PROT  8.4* 7.4  ALBUMIN 3.8 3.3*   No results for input(s): "LIPASE", "AMYLASE" in the last 168 hours. Recent Labs  Lab 04/18/22 0044  AMMONIA <10   CBC: Recent Labs  Lab 04/17/22 1445 04/19/22 0435 04/20/22 0539  WBC 11.6* 12.8* 11.5*  HGB 11.9* 10.1* 10.6*  HCT 35.1* 30.4* 32.5*  MCV 100.3* 102.0* 102.5*  PLT 375 334 356   Cardiac Enzymes: No results for input(s): "CKTOTAL", "CKMB", "CKMBINDEX", "TROPONINI" in the last 168 hours. BNP: Invalid input(s): "POCBNP" CBG: Recent Labs  Lab 04/19/22 1726 04/19/22 2129 04/20/22 0755 04/20/22 0922 04/20/22 1131  GLUCAP 81 74 121* 143* 140*       IMAGING RESULTS:  Imaging: MR BRAIN W WO CONTRAST  Result Date: 04/18/2022 CLINICAL DATA:  Altered mental status EXAM: MRI HEAD WITHOUT AND WITH CONTRAST TECHNIQUE: Multiplanar, multiecho pulse sequences of the brain and surrounding structures were obtained without and with intravenous contrast. CONTRAST:  61mL GADAVIST GADOBUTROL 1 MMOL/ML IV SOLN COMPARISON:  None Available. FINDINGS: Brain: No acute infarction, hemorrhage, hydrocephalus, extra-axial collection or mass lesion. There is diffuse, advanced volume loss for age. Ex vacuo ventriculomegaly. No chronic microhemorrhage. There is multifocal hyperintense T2-weighted signal within the periventricular and deep white matter. Vascular: Normal flow voids. Skull and upper cervical spine: Normal marrow signal. Sinuses/Orbits: Negative. Other: None IMPRESSION: 1. No acute intracranial abnormality. 2. Advanced diffuse volume loss for age with periventricular white matter hyperintensities. 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   On: 04/18/2022 19:33   @PROBHOSP @ No results found.   ASSESSMENT AND PLAN     -Multidisciplinary rounds held today  Altered mental status with encephalopathy   - obtain ABG   - blood cultures - patient has been on rocephin and zithromax -suspect due to alcoholism with withdrawal -possible septic encephalopathy due to Klebsiella UTI -urine culture noted -patient on multiple centrally acting Rx , zofran, dilaudid, ativan, precedex -thiamine/folate repletion -replete electrolytes -monitor for re-feeding   Delerium Tremens  -patient may need to have endotracheal intubation due to unresponsive status with blood in oral cavity and aspiration risk with inability to protect airway -CIWA protocol  -[place] Alcohol withdrawal protocol    Renal Failure-CKD 3 -follow UO -continue Foley Catheter-assess need daily -dc non essential nephrotoxins -currently with UTI    ID -continue IV abx as prescibed -follow up cultures  GI/Nutrition GI PROPHYLAXIS as indicated DIET-->TF's as tolerated Constipation protocol as indicated  ENDO - ICU hypoglycemic\Hyperglycemia protocol -check FSBS per protocol   ELECTROLYTES -follow labs as needed -replace as needed -pharmacy consultation   DVT/GI PRX ordered -SCDs  TRANSFUSIONS AS NEEDED MONITOR FSBS ASSESS the need for LABS as needed  Critical care provider statement:   Total critical care time: 33 minutes   Performed by: Lanney Gins MD   Critical care time was exclusive of separately billable procedures and treating other patients.   Critical care was necessary to treat or prevent imminent or life-threatening deterioration.   Critical care was time spent personally by me on the following activities: development of treatment plan with patient and/or surrogate as well as nursing, discussions with consultants, evaluation of patient's response to treatment, examination of patient, obtaining history from patient or surrogate, ordering and performing treatments and interventions, ordering and review of  laboratory studies, ordering and review of radiographic studies, pulse oximetry and re-evaluation of patient's condition.    Ottie Glazier, M.D.  Pulmonary & Critical Care  Medicine

## 2022-04-20 NOTE — Assessment & Plan Note (Signed)
Secondary to lack of p.o. intake while she is confused.  Change IV fluids to half-normal saline.

## 2022-04-20 NOTE — Progress Notes (Signed)
   04/20/22 1616  Assess: MEWS Score  Temp (!) 100.7 F (38.2 C)  BP (!) 156/78  MAP (mmHg) 101  Pulse Rate (!) 111  Resp (!) 24  Level of Consciousness Responds to Pain  SpO2 98 %  O2 Device Nasal Cannula  O2 Flow Rate (L/min) 2 L/min  Assess: MEWS Score  MEWS Temp 1  MEWS Systolic 0  MEWS Pulse 2  MEWS RR 1  MEWS LOC 2  MEWS Score 6  MEWS Score Color Red  Assess: if the MEWS score is Yellow or Red  Were vital signs taken at a resting state? Yes  Focused Assessment Change from prior assessment (see assessment flowsheet)  Does the patient meet 2 or more of the SIRS criteria? Yes  Does the patient have a confirmed or suspected source of infection? No  MEWS guidelines implemented *See Row Information* Yes  Treat  Pain Scale 0-10  Pain Score Asleep  Take Vital Signs  Increase Vital Sign Frequency  Red: Q 1hr X 4 then Q 4hr X 4, if remains red, continue Q 4hrs  Escalate  MEWS: Escalate Red: discuss with charge nurse/RN and provider, consider discussing with RRT  Notify: Charge Nurse/RN  Name of Charge Nurse/RN Notified Montey Hora,  Date Charge Nurse/RN Notified 04/20/22  Time Charge Nurse/RN Notified 1616  Provider Notification  Provider Name/Title Dr. Maryland Pink  Date Provider Notified 04/20/22  Time Provider Notified 1617  Method of Notification  (message)  Notification Reason Change in status  Provider response See new orders (transfer patient)  Date of Provider Response 04/20/22  Time of Provider Response 1620  Notify: Rapid Response  Name of Rapid Response RN Notified Katie, RN Myrlene Broker  Date Rapid Response Notified 04/20/22  Time Rapid Response Notified [id number]  Document  Patient Outcome Transferred/level of care increased  Progress note created (see row info) Yes  Assess: SIRS CRITERIA  SIRS Temperature  0  SIRS Pulse 1  SIRS Respirations  1  SIRS WBC 0  SIRS Score Sum  2

## 2022-04-21 ENCOUNTER — Inpatient Hospital Stay: Payer: Medicare Other

## 2022-04-21 DIAGNOSIS — G9340 Encephalopathy, unspecified: Secondary | ICD-10-CM | POA: Diagnosis not present

## 2022-04-21 LAB — ECHOCARDIOGRAM COMPLETE
AR max vel: 1.96 cm2
AV Area VTI: 1.64 cm2
AV Area mean vel: 1.9 cm2
AV Mean grad: 4 mmHg
AV Peak grad: 7.2 mmHg
Ao pk vel: 1.34 m/s
Area-P 1/2: 5.66 cm2
Height: 70 in
S' Lateral: 3.2 cm
Weight: 3280.44 oz

## 2022-04-21 LAB — MAGNESIUM
Magnesium: 1.2 mg/dL — ABNORMAL LOW (ref 1.7–2.4)
Magnesium: 2.3 mg/dL (ref 1.7–2.4)

## 2022-04-21 LAB — GLUCOSE, CAPILLARY
Glucose-Capillary: 103 mg/dL — ABNORMAL HIGH (ref 70–99)
Glucose-Capillary: 112 mg/dL — ABNORMAL HIGH (ref 70–99)
Glucose-Capillary: 115 mg/dL — ABNORMAL HIGH (ref 70–99)
Glucose-Capillary: 115 mg/dL — ABNORMAL HIGH (ref 70–99)
Glucose-Capillary: 120 mg/dL — ABNORMAL HIGH (ref 70–99)
Glucose-Capillary: 128 mg/dL — ABNORMAL HIGH (ref 70–99)

## 2022-04-21 LAB — BASIC METABOLIC PANEL
Anion gap: 8 (ref 5–15)
BUN: 23 mg/dL (ref 8–23)
CO2: 22 mmol/L (ref 22–32)
Calcium: 8.2 mg/dL — ABNORMAL LOW (ref 8.9–10.3)
Chloride: 113 mmol/L — ABNORMAL HIGH (ref 98–111)
Creatinine, Ser: 1.56 mg/dL — ABNORMAL HIGH (ref 0.44–1.00)
GFR, Estimated: 36 mL/min — ABNORMAL LOW (ref >60)
Glucose, Bld: 112 mg/dL — ABNORMAL HIGH (ref 70–99)
Potassium: 3.8 mmol/L (ref 3.5–5.1)
Sodium: 143 mmol/L (ref 135–145)

## 2022-04-21 LAB — CBC
HCT: 34.2 % — ABNORMAL LOW (ref 36.0–46.0)
Hemoglobin: 11.1 g/dL — ABNORMAL LOW (ref 12.0–15.0)
MCH: 33.3 pg (ref 26.0–34.0)
MCHC: 32.5 g/dL (ref 30.0–36.0)
MCV: 102.7 fL — ABNORMAL HIGH (ref 80.0–100.0)
Platelets: 341 10*3/uL (ref 150–400)
RBC: 3.33 MIL/uL — ABNORMAL LOW (ref 3.87–5.11)
RDW: 12.7 % (ref 11.5–15.5)
WBC: 14.1 10*3/uL — ABNORMAL HIGH (ref 4.0–10.5)
nRBC: 0 % (ref 0.0–0.2)

## 2022-04-21 LAB — RESP PANEL BY RT-PCR (RSV, FLU A&B, COVID)  RVPGX2
Influenza A by PCR: NEGATIVE
Influenza B by PCR: NEGATIVE
Resp Syncytial Virus by PCR: NEGATIVE
SARS Coronavirus 2 by RT PCR: NEGATIVE

## 2022-04-21 LAB — PHOSPHORUS: Phosphorus: 3.4 mg/dL (ref 2.5–4.6)

## 2022-04-21 LAB — BLOOD GAS, ARTERIAL: Mechanical Rate: 12

## 2022-04-21 MED ORDER — THIAMINE HCL 100 MG/ML IJ SOLN
500.0000 mg | Freq: Every day | INTRAVENOUS | Status: AC
  Administered 2022-04-22 – 2022-04-25 (×4): 500 mg via INTRAVENOUS
  Filled 2022-04-21 (×4): qty 5

## 2022-04-21 MED ORDER — POLYETHYLENE GLYCOL 3350 17 G PO PACK: 17.0000 g | PACK | Freq: Every day | ORAL | Status: DC

## 2022-04-21 MED ORDER — FENTANYL CITRATE PF 50 MCG/ML IJ SOSY
25.0000 ug | PREFILLED_SYRINGE | INTRAMUSCULAR | Status: DC | PRN
  Administered 2022-04-22 (×3): 50 ug via INTRAVENOUS
  Administered 2022-04-22: 100 ug via INTRAVENOUS
  Administered 2022-04-22: 50 ug via INTRAVENOUS
  Administered 2022-04-23 – 2022-04-24 (×5): 100 ug via INTRAVENOUS
  Filled 2022-04-21 (×4): qty 2
  Filled 2022-04-21: qty 1
  Filled 2022-04-21: qty 2
  Filled 2022-04-21: qty 1
  Filled 2022-04-21: qty 2
  Filled 2022-04-21: qty 1
  Filled 2022-04-21: qty 2
  Filled 2022-04-21: qty 1

## 2022-04-21 MED ORDER — MAGNESIUM SULFATE 2 GM/50ML IV SOLN
2.0000 g | Freq: Once | INTRAVENOUS | Status: AC
  Administered 2022-04-21: 2 g via INTRAVENOUS
  Filled 2022-04-21: qty 50

## 2022-04-21 MED ORDER — FENTANYL CITRATE PF 50 MCG/ML IJ SOSY: 25.0000 ug | PREFILLED_SYRINGE | INTRAMUSCULAR | Status: DC | PRN

## 2022-04-21 MED ORDER — FENTANYL CITRATE (PF) 100 MCG/2ML IJ SOLN
100.0000 ug | Freq: Once | INTRAMUSCULAR | Status: AC
  Administered 2022-04-21: 100 ug via INTRAVENOUS
  Filled 2022-04-21: qty 2

## 2022-04-21 MED ORDER — DOCUSATE SODIUM 50 MG/5ML PO LIQD
100.0000 mg | Freq: Two times a day (BID) | ORAL | Status: DC
  Administered 2022-04-22 – 2022-04-23 (×2): 100 mg
  Filled 2022-04-21 (×2): qty 10

## 2022-04-21 MED ORDER — THIAMINE HCL 100 MG/ML IJ SOLN
100.0000 mg | Freq: Every day | INTRAMUSCULAR | Status: DC
  Administered 2022-04-26 – 2022-05-05 (×10): 100 mg via INTRAVENOUS
  Filled 2022-04-21 (×9): qty 2

## 2022-04-21 MED ORDER — ETOMIDATE 2 MG/ML IV SOLN
20.0000 mg | Freq: Once | INTRAVENOUS | Status: AC
  Administered 2022-04-21: 20 mg via INTRAVENOUS
  Filled 2022-04-21: qty 10

## 2022-04-21 MED ORDER — ROCURONIUM BROMIDE 10 MG/ML (PF) SYRINGE
90.0000 mg | PREFILLED_SYRINGE | Freq: Once | INTRAVENOUS | Status: AC
  Administered 2022-04-21: 90 mg via INTRAVENOUS
  Filled 2022-04-21: qty 10

## 2022-04-21 NOTE — Procedures (Signed)
Endotracheal Intubation: Patient required placement of an artificial airway secondary to unresponsive state and acute hypoxic respiratory failure    Consent: Emergent.    Hand washing performed prior to starting the procedure.    Medications administered for sedation prior to procedure:  Fentanyl 100 mcg IV, 20 mg Etomidate IV, Rocuronium 90 mg IV     A time out procedure was called and correct patient, name, & ID confirmed. Needed supplies and equipment were assembled and checked to include ETT, 10 ml syringe, Glidescope, Mac and Miller blades, suction, oxygen and bag mask valve, end tidal CO2 monitor.    Patient was positioned to align the mouth and pharynx to facilitate visualization of the glottis.    Heart rate, SpO2 and blood pressure was continuously monitored during the procedure. Pre-oxygenation was conducted prior to intubation and endotracheal tube was placed through the vocal cords into the trachea.       The artificial airway was placed under direct visualization via glidescope route using a 8.0  ETT on the first attempt.   ETT was secured at 24 cm.   Placement was confirmed by auscuitation of lungs with good breath sounds bilaterally and no stomach sounds.  Condensation was noted on endotracheal tube.   Pulse ox 99%  CO2 detector in place with appropriate color change.    Complications: None .        Chest radiograph ordered and pending.   Pts husband Jasma Seevers notified prior to intubation regarding decline in pts condition and plan of care.  Mr. Germany agreeable to proceeding with mechanical intubation.  Donell Beers, [place] Pager [phone] (please enter 7 digits) PCCM Consult Pager [phone] (please enter 7 digits)

## 2022-04-21 NOTE — Progress Notes (Signed)
CRITICAL CARE PROGRESS NOTE    Name: Laurie Lin MRN: [id number] DOB: 07-May-1955     LOS: 3   SUBJECTIVE FINDINGS & SIGNIFICANT EVENTS    HPI: This is a 67 yo F with hx of alcoholism, she drinks several beers and liquor drinks daily chronically.  She also has background of HTN, DM1, CKD3, who came in with severe hyperglycemia, AMS encephalopathy, noted to have hematemesis at home and has dry blood in mouth during my examination. She has been on the medical floor with treatment for past 3 days.  She has been receiving therapy for DM, UTI due to klebsiella, Wernickes encephalopathy, AKI, and bronchitis.  While on floor she continued to deteriorate and has become unresponsive with jerky movements. PCCM consultation for concern patient developing Delerium Tremens.     04/21/22- patient is still poorly responsive with signs of EtOH withdrawal. MRI brain with alcoholic encephalopathy.  I met with family today and reviewed care plan.   Lines/tubes : External Urinary Catheter (Active)  Collection Container Dedicated Suction Canister 04/20/22 0800  Suction (Verified suction is between 40-80 mmHg) Yes 04/20/22 0800  Securement Method None needed 04/20/22 0800  Site Assessment Clean, Dry, Intact 04/20/22 0800  Intervention Female External Urinary Catheter Replaced 04/20/22 0800    Microbiology/Sepsis markers: Results for orders placed or performed during the hospital encounter of 04/17/22  Urine Culture     Status: Abnormal   Collection Time: 04/18/22  2:00 PM   Specimen: Urine, Clean Catch  Result Value Ref Range Status   Specimen Description   Final    URINE, CLEAN CATCH Performed at [facility], [address], [place], [place] [address]    Special Requests   Final    NONE Performed at [facility], [address], [place], [place] [address]    Culture >=100,000 COLONIES/mL KLEBSIELLA PNEUMONIAE (A)  Final   Report Status 04/20/2022 FINAL  Final   Organism ID, Bacteria KLEBSIELLA PNEUMONIAE (A)  Final      Susceptibility   Klebsiella pneumoniae - MIC*    AMPICILLIN RESISTANT Resistant     CEFAZOLIN <=4 SENSITIVE Sensitive     CEFEPIME <=0.12 SENSITIVE Sensitive     CEFTRIAXONE <=0.25 SENSITIVE Sensitive     CIPROFLOXACIN <=0.25 SENSITIVE Sensitive     GENTAMICIN <=1 SENSITIVE Sensitive     IMIPENEM <=0.25 SENSITIVE Sensitive     NITROFURANTOIN <=16 SENSITIVE Sensitive     TRIMETH/SULFA <=20 SENSITIVE Sensitive     AMPICILLIN/SULBACTAM <=2 SENSITIVE Sensitive     PIP/TAZO <=4 SENSITIVE Sensitive     * >=100,000 COLONIES/mL KLEBSIELLA PNEUMONIAE  MRSA Next Gen by PCR, Nasal     Status: None   Collection Time: 04/20/22  4:52 PM   Specimen: Nasal Mucosa; Nasal Swab  Result Value Ref Range Status   MRSA by PCR Next Gen NOT DETECTED NOT DETECTED Final    Comment: (NOTE) The GeneXpert MRSA Assay (FDA approved for NASAL specimens only), is one component of a comprehensive MRSA colonization surveillance program. It is not intended to diagnose MRSA infection nor to guide or monitor treatment for MRSA infections. Test performance is not FDA approved in patients less than 75 years old. Performed at [facility], [address] [place], [place] [address]   Culture, blood (Routine X 2) w Reflex to ID Panel     Status: None (Preliminary result)   Collection Time: 04/20/22  5:25 PM   Specimen: BLOOD  Result Value Ref Range  Status   Specimen Description BLOOD BLOOD LEFT ARM  Final   Special Requests   Final    BOTTLES DRAWN AEROBIC AND ANAEROBIC Blood Culture adequate volume   Culture   Final    NO GROWTH < 12 HOURS Performed at [facility], [address] [place], [place] [address]    Report Status PENDING  Incomplete  Culture, blood (Routine X  2) w Reflex to ID Panel     Status: None (Preliminary result)   Collection Time: 04/20/22  5:33 PM   Specimen: BLOOD  Result Value Ref Range Status   Specimen Description BLOOD BLOOD RIGHT HAND  Final   Special Requests   Final    BOTTLES DRAWN AEROBIC AND ANAEROBIC Blood Culture adequate volume   Culture   Final    NO GROWTH < 12 HOURS Performed at [facility], [address] [place], [place] [address]    Report Status PENDING  Incomplete    Anti-infectives:  Anti-infectives (From admission, onward)    Start     Dose/Rate Route Frequency Ordered Stop   04/19/22 1815  cefTRIAXone (ROCEPHIN) 1 g in sodium chloride 0.9 % 100 mL IVPB        1 g 200 mL/hr over 30 Minutes Intravenous Every 24 hours 04/19/22 1715     04/19/22 1815  azithromycin (ZITHROMAX) 500 mg in sodium chloride 0.9 % 250 mL IVPB        500 mg 250 mL/hr over 60 Minutes Intravenous Every 24 hours 04/19/22 1715     04/18/22 1500  doxycycline (VIBRAMYCIN) 100 mg in sodium chloride 0.9 % 250 mL IVPB  Status:  Discontinued        100 mg 125 mL/hr over 120 Minutes Intravenous Every 12 hours 04/18/22 1344 04/19/22 1715        Consults: Treatment Team:  Annita Brod, MD Pccm, [facility], MD    PAST MEDICAL HISTORY   Past Medical History:  Diagnosis Date   Diabetes mellitus without complication ([place])    GERD (gastroesophageal reflux disease)    Hypertension    Renal disorder    stage 3      SURGICAL HISTORY   Past Surgical History:  Procedure Laterality Date   DILATION AND CURETTAGE OF UTERUS     Miscarriage   EYE SURGERY     bilateral caterac   FINGER SURGERY     KNEE ARTHROSCOPY Left 1989   [facility]   PILONIDAL CYST DRAINAGE       FAMILY HISTORY   Family History  Problem Relation Age of Onset   Diabetes Mother      SOCIAL HISTORY   Social History   Tobacco Use   Smoking status: Never   Smokeless tobacco: Never  Vaping Use   Vaping Use: Never used  Substance Use  Topics   Alcohol use: Yes    Alcohol/week: 6.0 standard drinks of alcohol    Types: 3 Cans of beer, 3 Shots of liquor per week    Comment: heavy drinker   Drug use: No     MEDICATIONS   Current Medication:  Current Facility-Administered Medications:    0.45 % sodium chloride infusion, , Intravenous, Continuous, Annita Brod, MD, Last Rate: 100 mL/hr at 04/21/22 0900, Infusion Verify at 04/21/22 0900   acetaminophen (TYLENOL) tablet 650 mg, 650 mg, Oral, Q6H PRN **OR** acetaminophen (TYLENOL) suppository 650 mg, 650 mg, Rectal, Q6H PRN, Annita Brod, MD   azithromycin (ZITHROMAX) 500 mg in  sodium chloride 0.9 % 250 mL IVPB, 500 mg, Intravenous, Q24H, Annita Brod, MD, Stopped at 04/20/22 1924   cefTRIAXone (ROCEPHIN) 1 g in sodium chloride 0.9 % 100 mL IVPB, 1 g, Intravenous, Q24H, Annita Brod, MD, Stopped at 04/20/22 1710   Chlorhexidine Gluconate Cloth 2 % PADS 6 each, 6 each, Topical, Daily, Annita Brod, MD, 6 each at 04/20/22 1654   dexmedetomidine (PRECEDEX) 200 MCG/50ML (4 mcg/mL) infusion, 0-1.2 mcg/kg/hr, Intravenous, Titrated, [place] Pink, Trenton Gammon, MD   enoxaparin (LOVENOX) injection 40 mg, 40 mg, Subcutaneous, Q24H, Annita Brod, MD, 40 mg at 04/21/22 0848   HYDROcodone-acetaminophen (NORCO/VICODIN) 5-325 MG per tablet 1 tablet, 1 tablet, Oral, Q6H PRN, Annita Brod, MD   insulin aspart (novoLOG) injection 0-9 Units, 0-9 Units, Subcutaneous, TID WC, Annita Brod, MD, 1 Units at 04/20/22 1213   insulin glargine-yfgn (SEMGLEE) injection 12 Units, 12 Units, Subcutaneous, Daily, Annita Brod, MD, 12 Units at 04/21/22 6384   labetalol (NORMODYNE) injection 10 mg, 10 mg, Intravenous, Q2H PRN, Lang Snow, NP, 10 mg at 04/21/22 0143   LORazepam (ATIVAN) injection 0-4 mg, 0-4 mg, Intravenous, Q12H, 1 mg at 04/21/22 0837 **OR** LORazepam (ATIVAN) tablet 0-4 mg, 0-4 mg, Oral, Q12H, [place] Pink, Trenton Gammon, MD   LORazepam (ATIVAN)  tablet 1-4 mg, 1-4 mg, Oral, Q1H PRN **OR** LORazepam (ATIVAN) injection 1-4 mg, 1-4 mg, Intravenous, Q1H PRN, Annita Brod, MD, 2 mg at 04/21/22 1141   magnesium hydroxide (MILK OF MAGNESIA) suspension 30 mL, 30 mL, Oral, Daily PRN, Annita Brod, MD   metoCLOPramide (REGLAN) injection 5 mg, 5 mg, Intravenous, Q8H, Annita Brod, MD, 5 mg at 04/21/22 6659   multivitamin with minerals tablet 1 tablet, 1 tablet, Oral, Daily, Annita Brod, MD   ondansetron (ZOFRAN) tablet 4 mg, 4 mg, Oral, Q6H PRN **OR** ondansetron (ZOFRAN) injection 4 mg, 4 mg, Intravenous, Q6H PRN, Annita Brod, MD   pantoprazole (PROTONIX) injection 40 mg, 40 mg, Intravenous, Q24H, Annita Brod, MD, 40 mg at 04/21/22 0849   promethazine (PHENERGAN) 12.5 mg in sodium chloride 0.9 % 50 mL IVPB, 12.5 mg, Intravenous, Q6H PRN, Annita Brod, MD   thiamine (VITAMIN B1) tablet 100 mg, 100 mg, Oral, Daily **OR** thiamine (VITAMIN B1) injection 100 mg, 100 mg, Intravenous, Daily, Annita Brod, MD, 100 mg at 04/21/22 0849   tiZANidine (ZANAFLEX) tablet 4 mg, 4 mg, Oral, TID PRN, Annita Brod, MD   traZODone (DESYREL) tablet 25 mg, 25 mg, Oral, QHS PRN, Annita Brod, MD    ALLERGIES   Ibuprofen, Nsaids, Brimonidine, and Latex    REVIEW OF SYSTEMS    Unable to obtain due to unresponsive mental status  PHYSICAL EXAMINATION   Vital Signs: Temp:  [98.4 F (36.9 C)-100.9 F (38.3 C)] 100.6 F (38.1 C) (01/01 1230) Pulse Rate:  [90-125] 105 (01/01 1400) Resp:  [16-28] 28 (01/01 1400) BP: (122-168)/(68-112) 157/69 (01/01 1400) SpO2:  [92 %-100 %] 92 % (01/01 1400)  GENERAL: unresponsive , age appropriate HEAD: Normocephalic, atraumatic.  EYES: Pupils equal, round, reactive to light.  No scleral icterus.  MOUTH: Dry mucosal membranes with dark dry blood in oral cavity NECK: Supple. No thyromegaly. No nodules. No JVD.  PULMONARY: rhonchi bilaterally  CARDIOVASCULAR: S1  and S2. Regular rate and rhythm. No murmurs, rubs, or gallops.  GASTROINTESTINAL: Soft, nontender, non-distended. No masses. Positive bowel sounds. No hepatosplenomegaly.  MUSCULOSKELETAL: No swelling, clubbing, or edema.  NEUROLOGIC: GCS7 with  jerky muscular movements SKIN:intact,warm,dry   PERTINENT DATA     Infusions:  sodium chloride 100 mL/hr at 04/21/22 0900   azithromycin Stopped (04/20/22 1924)   cefTRIAXone (ROCEPHIN)  IV Stopped (04/20/22 1710)   dexmedetomidine (PRECEDEX) IV infusion     promethazine (PHENERGAN) injection (IM or IVPB)     Scheduled Medications:  Chlorhexidine Gluconate Cloth  6 each Topical Daily   enoxaparin (LOVENOX) injection  40 mg Subcutaneous Q24H   insulin aspart  0-9 Units Subcutaneous TID WC   insulin glargine-yfgn  12 Units Subcutaneous Daily   LORazepam  0-4 mg Intravenous Q12H   Or   LORazepam  0-4 mg Oral Q12H   metoCLOPramide (REGLAN) injection  5 mg Intravenous Q8H   multivitamin with minerals  1 tablet Oral Daily   pantoprazole (PROTONIX) IV  40 mg Intravenous Q24H   thiamine  100 mg Oral Daily   Or   thiamine  100 mg Intravenous Daily   PRN Medications: acetaminophen **OR** acetaminophen, HYDROcodone-acetaminophen, labetalol, LORazepam **OR** LORazepam, magnesium hydroxide, ondansetron **OR** ondansetron (ZOFRAN) IV, promethazine (PHENERGAN) injection (IM or IVPB), tiZANidine, traZODone Hemodynamic parameters:   Intake/Output: 12/31 0701 - 01/01 0700 In: 3525.8 [I.V.:2601.3; IV Piggyback:924.5] Out: 450 [Urine:450]  Ventilator  Settings:     LAB RESULTS:  Basic Metabolic Panel: Recent Labs  Lab 04/17/22 1445 04/18/22 0044 04/18/22 0931 04/19/22 0435 04/20/22 0539 04/21/22 0145 04/21/22 1317  NA 136  --  137 143 147* 143  --   K 5.4* 4.7 4.8 4.0 3.7 3.8  --   CL 101  --  104 115* 115* 113*  --   CO2 20*  --  18* 21* 22 22  --   GLUCOSE 306*  --  458* 146* 121* 112*  --   BUN 26*  --  33* 30* 24* 23  --    CREATININE 2.34*  --  2.74* 2.03* 1.54* 1.56*  --   CALCIUM 10.2  --  9.3 8.9 8.7* 8.2*  --   MG  --  1.4*  --  1.4*  --  1.2* 2.3  PHOS  --   --   --  2.6  --  3.4  --     Liver Function Tests: Recent Labs  Lab 04/17/22 1445 04/19/22 0435  AST 29 21  ALT 13 7  ALKPHOS 52 40  BILITOT 1.8* 0.6  PROT 8.4* 7.4  ALBUMIN 3.8 3.3*    No results for input(s): "LIPASE", "AMYLASE" in the last 168 hours. Recent Labs  Lab 04/18/22 0044  AMMONIA <10    CBC: Recent Labs  Lab 04/17/22 1445 04/19/22 0435 04/20/22 0539 04/21/22 0145  WBC 11.6* 12.8* 11.5* 14.1*  HGB 11.9* 10.1* 10.6* 11.1*  HCT 35.1* 30.4* 32.5* 34.2*  MCV 100.3* 102.0* 102.5* 102.7*  PLT 375 334 356 341    Cardiac Enzymes: No results for input(s): "CKTOTAL", "CKMB", "CKMBINDEX", "TROPONINI" in the last 168 hours. BNP: Invalid input(s): "POCBNP" CBG: Recent Labs  Lab 04/20/22 2116 04/20/22 2324 04/21/22 0407 04/21/22 0736 04/21/22 1151  GLUCAP 133* 116* 103* 128* 115*        IMAGING RESULTS:  Imaging: ECHOCARDIOGRAM COMPLETE  Result Date: 04/21/2022    ECHOCARDIOGRAM REPORT   Patient Name:   Laurie Lin Date of Exam: 04/20/2022 Medical Rec #:  [id number]       Height:       70.0 in Accession #:    [id number]      Weight:  205.0 lb Date of Birth:  1956/01/08       BSA:          2.109 m Patient Age:    49 years        BP:           159/79 mmHg Patient Gender: F               HR:           102 bpm. Exam Location:  [facility] Procedure: 2D Echo, Color Doppler and Cardiac Doppler Indications:     I50.31 congestive heart failure-Acute Diastolic  History:         Patient has no prior history of Echocardiogram examinations.                  Risk Factors:Hypertension and Diabetes.  Sonographer:     Charmayne Sheer Referring Phys:  [id number] Trenton Gammon [facility] Diagnosing Phys: Isaias Cowman MD  Sonographer Comments: Suboptimal apical window. Image acquisition challenging due to respiratory motion and and inability  to awaken patient. IMPRESSIONS  1. Left ventricular ejection fraction, by estimation, is 50 to 55%. The left ventricle has low normal function. The left ventricle has no regional wall motion abnormalities. Left ventricular diastolic parameters were normal.  2. Right ventricular systolic function is normal. The right ventricular size is normal.  3. The mitral valve is normal in structure. Trivial mitral valve regurgitation. No evidence of mitral stenosis.  4. The aortic valve is normal in structure. Aortic valve regurgitation is not visualized. No aortic stenosis is present.  5. The inferior vena cava is normal in size with greater than 50% respiratory variability, suggesting right atrial pressure of 3 mmHg. FINDINGS  Left Ventricle: Left ventricular ejection fraction, by estimation, is 50 to 55%. The left ventricle has low normal function. The left ventricle has no regional wall motion abnormalities. The left ventricular internal cavity size was normal in size. There is no left ventricular hypertrophy. Left ventricular diastolic parameters were normal. Right Ventricle: The right ventricular size is normal. No increase in right ventricular wall thickness. Right ventricular systolic function is normal. Left Atrium: Left atrial size was normal in size. Right Atrium: Right atrial size was normal in size. Pericardium: There is no evidence of pericardial effusion. Mitral Valve: The mitral valve is normal in structure. Trivial mitral valve regurgitation. No evidence of mitral valve stenosis. Tricuspid Valve: The tricuspid valve is normal in structure. Tricuspid valve regurgitation is trivial. No evidence of tricuspid stenosis. Aortic Valve: The aortic valve is normal in structure. Aortic valve regurgitation is not visualized. No aortic stenosis is present. Aortic valve mean gradient measures 4.0 mmHg. Aortic valve peak gradient measures 7.2 mmHg. Aortic valve area, by VTI measures 1.64 cm. Pulmonic Valve: The pulmonic  valve was normal in structure. Pulmonic valve regurgitation is not visualized. No evidence of pulmonic stenosis. Aorta: The aortic root is normal in size and structure. Venous: The inferior vena cava is normal in size with greater than 50% respiratory variability, suggesting right atrial pressure of 3 mmHg. IAS/Shunts: No atrial level shunt detected by color flow Doppler.  LEFT VENTRICLE PLAX 2D LVIDd:         4.40 cm   Diastology LVIDs:         3.20 cm   LV e' medial:    7.94 cm/s LV PW:         0.90 cm   LV E/e' medial:  10.3 LV IVS:  0.80 cm   LV e' lateral:   8.27 cm/s LVOT diam:     1.80 cm   LV E/e' lateral: 9.9 LV SV:         39 LV SV Index:   18 LVOT Area:     2.54 cm  RIGHT VENTRICLE RV Basal diam:  3.00 cm RV S prime:     19.00 cm/s LEFT ATRIUM             Index        RIGHT ATRIUM           Index LA diam:        3.30 cm 1.56 cm/m   RA Area:     12.90 cm LA Vol (A2C):   32.6 ml 15.45 ml/m  RA Volume:   27.20 ml  12.89 ml/m LA Vol (A4C):   54.9 ml 26.03 ml/m LA Biplane Vol: 45.8 ml 21.71 ml/m  AORTIC VALVE                    PULMONIC VALVE AV Area (Vmax):    1.96 cm     PV Vmax:       1.03 m/s AV Area (Vmean):   1.90 cm     PV Vmean:      63.500 cm/s AV Area (VTI):     1.64 cm     PV VTI:        0.171 m AV Vmax:           134.00 cm/s  PV Peak grad:  4.2 mmHg AV Vmean:          93.800 cm/s  PV Mean grad:  2.0 mmHg AV VTI:            0.237 m AV Peak Grad:      7.2 mmHg AV Mean Grad:      4.0 mmHg LVOT Vmax:         103.00 cm/s LVOT Vmean:        70.200 cm/s LVOT VTI:          0.153 m LVOT/AV VTI ratio: 0.65  AORTA Ao Root diam: 3.10 cm MITRAL VALVE MV Area (PHT): 5.66 cm    SHUNTS MV Decel Time: 134 msec    Systemic VTI:  0.15 m MV E velocity: 81.80 cm/s  Systemic Diam: 1.80 cm MV A velocity: 81.80 cm/s MV E/A ratio:  1.00 Isaias Cowman MD Electronically signed by Isaias Cowman MD Signature Date/Time: 04/21/2022/9:57:17 AM    Final    _0 @ No results found.   ASSESSMENT  AND PLAN    -Multidisciplinary rounds held today  Altered mental status with encephalopathy   - obtain ABG   - blood cultures - patient has been on rocephin and zithromax -suspect due to alcoholism with withdrawal -possible septic encephalopathy due to Klebsiella UTI -urine culture noted -patient on multiple centrally acting Rx , zofran, dilaudid, ativan, precedex -thiamine/folate repletion -replete electrolytes -monitor for re-feeding   Delerium Tremens  -patient may need to have endotracheal intubation due to unresponsive status with blood in oral cavity and aspiration risk with inability to protect airway -CIWA protocol  -[place] Alcohol withdrawal protocol    Renal Failure-CKD 3 -follow UO -continue Foley Catheter-assess need daily -dc non essential nephrotoxins -currently with UTI    ID -continue IV abx as prescibed -follow up cultures  GI/Nutrition GI PROPHYLAXIS as indicated DIET-->TF's as tolerated Constipation protocol as indicated  ENDO - ICU hypoglycemic\Hyperglycemia protocol -  check FSBS per protocol   ELECTROLYTES -follow labs as needed -replace as needed -pharmacy consultation   DVT/GI PRX ordered -SCDs  TRANSFUSIONS AS NEEDED MONITOR FSBS ASSESS the need for LABS as needed  Critical care provider statement:   Total critical care time: 33 minutes   Performed by: Lanney Gins MD   Critical care time was exclusive of separately billable procedures and treating other patients.   Critical care was necessary to treat or prevent imminent or life-threatening deterioration.   Critical care was time spent personally by me on the following activities: development of treatment plan with patient and/or surrogate as well as nursing, discussions with consultants, evaluation of patient's response to treatment, examination of patient, obtaining history from patient or surrogate, ordering and performing treatments and interventions, ordering and review of  laboratory studies, ordering and review of radiographic studies, pulse oximetry and re-evaluation of patient's condition.    Ottie Glazier, M.D.  Pulmonary & Critical Care Medicine

## 2022-04-22 ENCOUNTER — Inpatient Hospital Stay: Payer: Medicare Other

## 2022-04-22 DIAGNOSIS — G9340 Encephalopathy, unspecified: Secondary | ICD-10-CM | POA: Diagnosis not present

## 2022-04-22 LAB — CBC
HCT: 28.8 % — ABNORMAL LOW (ref 36.0–46.0)
Hemoglobin: 9.4 g/dL — ABNORMAL LOW (ref 12.0–15.0)
MCH: 32.9 pg (ref 26.0–34.0)
MCHC: 32.6 g/dL (ref 30.0–36.0)
MCV: 100.7 fL — ABNORMAL HIGH (ref 80.0–100.0)
Platelets: 322 10*3/uL (ref 150–400)
RBC: 2.86 MIL/uL — ABNORMAL LOW (ref 3.87–5.11)
RDW: 12.4 % (ref 11.5–15.5)
WBC: 11.5 10*3/uL — ABNORMAL HIGH (ref 4.0–10.5)
nRBC: 0 % (ref 0.0–0.2)

## 2022-04-22 LAB — BASIC METABOLIC PANEL
Anion gap: 12 (ref 5–15)
BUN: 28 mg/dL — ABNORMAL HIGH (ref 8–23)
CO2: 19 mmol/L — ABNORMAL LOW (ref 22–32)
Calcium: 7.9 mg/dL — ABNORMAL LOW (ref 8.9–10.3)
Chloride: 110 mmol/L (ref 98–111)
Creatinine, Ser: 1.63 mg/dL — ABNORMAL HIGH (ref 0.44–1.00)
GFR, Estimated: 34 mL/min — ABNORMAL LOW (ref >60)
Glucose, Bld: 138 mg/dL — ABNORMAL HIGH (ref 70–99)
Potassium: 3.3 mmol/L — ABNORMAL LOW (ref 3.5–5.1)
Sodium: 141 mmol/L (ref 135–145)

## 2022-04-22 LAB — GLUCOSE, CAPILLARY
Glucose-Capillary: 126 mg/dL — ABNORMAL HIGH (ref 70–99)
Glucose-Capillary: 128 mg/dL — ABNORMAL HIGH (ref 70–99)
Glucose-Capillary: 129 mg/dL — ABNORMAL HIGH (ref 70–99)
Glucose-Capillary: 139 mg/dL — ABNORMAL HIGH (ref 70–99)
Glucose-Capillary: 145 mg/dL — ABNORMAL HIGH (ref 70–99)
Glucose-Capillary: 151 mg/dL — ABNORMAL HIGH (ref 70–99)

## 2022-04-22 LAB — AMMONIA: Ammonia: 38 umol/L — ABNORMAL HIGH (ref 9–35)

## 2022-04-22 LAB — PHOSPHORUS: Phosphorus: 2.7 mg/dL (ref 2.5–4.6)

## 2022-04-22 LAB — VITAMIN B1: Vitamin B1 (Thiamine): 118.2 nmol/L (ref 66.5–200.0)

## 2022-04-22 LAB — MAGNESIUM: Magnesium: 2.1 mg/dL (ref 1.7–2.4)

## 2022-04-22 MED ORDER — PROSOURCE TF20 ENFIT COMPATIBL EN LIQD
60.0000 mL | Freq: Every day | ENTERAL | Status: DC
  Administered 2022-04-23 – 2022-04-28 (×5): 60 mL

## 2022-04-22 MED ORDER — MIDAZOLAM HCL 2 MG/2ML IJ SOLN
1.0000 mg | INTRAMUSCULAR | Status: DC | PRN
  Administered 2022-04-22 – 2022-04-23 (×3): 2 mg via INTRAVENOUS
  Filled 2022-04-22 (×3): qty 2

## 2022-04-22 MED ORDER — ORAL CARE MOUTH RINSE: 15.0000 mL | OROMUCOSAL | Status: DC | PRN

## 2022-04-22 MED ORDER — ONDANSETRON HCL 4 MG PO TABS: 4.0000 mg | ORAL_TABLET | Freq: Four times a day (QID) | ORAL | Status: DC | PRN

## 2022-04-22 MED ORDER — VITAL AF 1.2 CAL PO LIQD
1000.0000 mL | ORAL | Status: DC
  Administered 2022-04-22 – 2022-04-23 (×2): 1000 mL

## 2022-04-22 MED ORDER — ACETAMINOPHEN 650 MG RE SUPP: 650.0000 mg | Freq: Four times a day (QID) | RECTAL | Status: DC | PRN

## 2022-04-22 MED ORDER — DEXMEDETOMIDINE HCL IN NACL 400 MCG/100ML IV SOLN
0.0000 ug/kg/h | INTRAVENOUS | Status: DC
  Administered 2022-04-22: 0.8 ug/kg/h via INTRAVENOUS
  Administered 2022-04-22: 0.6 ug/kg/h via INTRAVENOUS
  Administered 2022-04-22: 0.4 ug/kg/h via INTRAVENOUS
  Administered 2022-04-23 (×2): 0.5 ug/kg/h via INTRAVENOUS
  Administered 2022-04-24: 0.6 ug/kg/h via INTRAVENOUS
  Filled 2022-04-22 (×7): qty 100

## 2022-04-22 MED ORDER — FREE WATER
30.0000 mL | Status: DC
  Administered 2022-04-22 – 2022-04-24 (×10): 30 mL

## 2022-04-22 MED ORDER — ORAL CARE MOUTH RINSE
15.0000 mL | OROMUCOSAL | Status: DC
  Administered 2022-04-22 – 2022-04-24 (×23): 15 mL via OROMUCOSAL

## 2022-04-22 MED ORDER — MAGNESIUM HYDROXIDE 400 MG/5ML PO SUSP
30.0000 mL | Freq: Every day | ORAL | Status: DC | PRN
  Administered 2022-05-01: 30 mL
  Filled 2022-04-22: qty 30

## 2022-04-22 MED ORDER — ONDANSETRON HCL 4 MG/2ML IJ SOLN
4.0000 mg | Freq: Four times a day (QID) | INTRAMUSCULAR | Status: DC | PRN
  Administered 2022-04-26 – 2022-04-28 (×2): 4 mg via INTRAVENOUS
  Filled 2022-04-22 (×2): qty 2

## 2022-04-22 MED ORDER — FOLIC ACID 5 MG/ML IJ SOLN
1.0000 mg | Freq: Every day | INTRAMUSCULAR | Status: DC
  Administered 2022-04-23 – 2022-05-05 (×13): 1 mg via INTRAVENOUS
  Filled 2022-04-22 (×14): qty 0.2

## 2022-04-22 MED ORDER — ACETAMINOPHEN 325 MG PO TABS
650.0000 mg | ORAL_TABLET | Freq: Four times a day (QID) | ORAL | Status: DC | PRN
  Administered 2022-04-24 – 2022-05-04 (×6): 650 mg
  Filled 2022-04-22 (×6): qty 2

## 2022-04-22 NOTE — Inpatient Diabetes Management (Signed)
Inpatient Diabetes Program Recommendations  AACE/ADA: New Consensus Statement on Inpatient Glycemic Control   Target Ranges:  Prepandial:   less than 140 mg/dL      Peak postprandial:   less than 180 mg/dL (1-2 hours)      Critically ill patients:  140 - 180 mg/dL    Latest Reference Range & Units 04/21/22 07:36 04/21/22 11:51 04/21/22 15:23 04/21/22 19:31 04/21/22 23:31 04/22/22 03:36 04/22/22 08:06  Glucose-Capillary 70 - 99 mg/dL 128 (H) 115 (H) 115 (H) 112 (H) 120 (H) 126 (H) 128 (H)   Review of Glycemic Control  Diabetes history: DM1 (does not make any insulin; requires basal, correction, and carbohydrate coverage insulin) Outpatient Diabetes medications: Basaglar 12 units QHS, Novolog 0-9 units TID with meals  Current orders for Inpatient glycemic control: Semglee 12 units daily  Inpatient Diabetes Program Recommendations:    Insulin: Please consider ordering Novolog 0-6 units Q4H.  Thanks, Barnie Alderman, RN, MSN, [place] Diabetes Coordinator Inpatient Diabetes Program [phone] (Team Pager from 8am to 5pm)'

## 2022-04-22 NOTE — Progress Notes (Signed)
Initial Nutrition Assessment  DOCUMENTATION CODES:   Not applicable  INTERVENTION:   Vital 1.2@60ml /hr- Initiate at 55ml/hr and increase by 88ml/hr q 8 hours until goal rate is reached.   ProSource TF 20- Give 77ml daily via tube, each supplement provides 80kcal and 20g of protein.   Free water flushes 44ml q4 hours to maintain tube patency   Regimen provides 1808kcal/day, 128g/day protein and 1332ml/day of free water.   Pt at high refeed risk; recommend monitor potassium, magnesium and phosphorus labs daily until stable  Daily weights   Folic acid 1mg  IV daily   NUTRITION DIAGNOSIS:   Inadequate oral intake related to inability to eat (pt sedated and ventilated) as evidenced by NPO status.  GOAL:   Provide needs based on ASPEN/SCCM guidelines  MONITOR:   Vent status, Labs, Weight trends, TF tolerance, Skin, I & O's  REASON FOR ASSESSMENT:   Consult Enteral/tube feeding initiation and management  ASSESSMENT:   67 y/o female with h/o IDDM, HTN, etoh abuse, GERD, hital hernia, neuropathy, cirrhosis, varices and CKD III who is admitted with encephalopathy and possible Wernickes.  Pt sedated and ventilated. NGT in place. Plan is to start tube feeds today. Pt is at high refeed risk. Per chart, pt with weight gain pta.    Medications reviewed and include: colace, lovenox, insulin, reglan, protonix, thiamine. NaCl @100ml /hr, azithromycin, ceftriaxone   Labs reviewed: K 3.3(L), BUN 28(H), creat 1.63(H), P 2.7 wnl, Mg 2.1 wnl Ammonia- 38(H) Folate- 3.4(L) Wbc- 11.5(H), Hgb 9.4(L), Hct 28.8(L) Cbgs- 129, 128, 126 x 24 hrs  AIC 7.2(H)- 12/29  Patient is currently intubated on ventilator support MV: 9.0 L/min Temp (24hrs), Avg:99 F (37.2 C), Min:97.8 F (36.6 C), Max:100 F (37.8 C)  Propofol: none   MAP- >3mmHg   UOP- 722ml   NUTRITION - FOCUSED PHYSICAL EXAM:  Flowsheet Row Most Recent Value  Orbital Region No depletion  Upper Arm Region No depletion   Thoracic and Lumbar Region No depletion  Buccal Region No depletion  Temple Region No depletion  Clavicle Bone Region No depletion  Clavicle and Acromion Bone Region No depletion  Scapular Bone Region No depletion  Dorsal Hand No depletion  Patellar Region No depletion  Anterior Thigh Region No depletion  Posterior Calf Region No depletion  Edema (RD Assessment) Mild  Hair Reviewed  Eyes Reviewed  Mouth Reviewed  Skin Reviewed  Nails Reviewed   Diet Order:   Diet Order             Diet NPO time specified  Diet effective now                  EDUCATION NEEDS:   No education needs have been identified at this time  Skin:  Skin Assessment: Reviewed RN Assessment (skin tear hand)  Last BM:  12/30  Height:   Ht Readings from Last 1 Encounters:  04/20/22 5\' 10"  (1.778 m)    Weight:   Wt Readings from Last 1 Encounters:  04/17/22 93 kg    Ideal Body Weight:  75.45 kg  BMI:  Body mass index is 29.42 kg/m.  Estimated Nutritional Needs:   Kcal:  1867kcal/day  Protein:  >135g/day  Fluid:  1.7-2.0L/day  Koleen Distance MS, RD, LDN Please refer to [facility] for RD and/or RD on-call/weekend/after hours pager

## 2022-04-22 NOTE — Progress Notes (Signed)
CRITICAL CARE PROGRESS NOTE    Name: Laurie Lin MRN: [id number] DOB: February 27, 1956     LOS: 4   SUBJECTIVE FINDINGS & SIGNIFICANT EVENTS    HPI: This is a 67 yo F with hx of alcoholism, she drinks several beers and liquor drinks daily chronically.  She also has background of HTN, DM1, CKD3, who came in with severe hyperglycemia, AMS encephalopathy, noted to have hematemesis at home and has dry blood in mouth during my examination. She has been on the medical floor with treatment for past 3 days.  She has been receiving therapy for DM, UTI due to klebsiella, Wernickes encephalopathy, AKI, and bronchitis.  While on floor she continued to deteriorate and has become unresponsive with jerky movements. PCCM consultation for concern patient developing Delerium [place]ens.     04/21/22- patient is still poorly responsive with signs of EtOH withdrawal. MRI brain with alcoholic encephalopathy.  I met with family today and reviewed care plan.   Intubated due to worsening obtundation  04/22/22- SAT showed continued altered mental status, vent dyssynchrony, and high peak pressures.   Resumed precedex gtt. NCHCT pending.   Lines/tubes : External Urinary Catheter (Active)  Collection Container Dedicated Suction Canister 04/20/22 0800  Suction (Verified suction is between 40-80 mmHg) Yes 04/20/22 0800  Securement Method None needed 04/20/22 0800  Site Assessment Clean, Dry, Intact 04/20/22 0800  Intervention Female External Urinary Catheter Replaced 04/20/22 0800    Microbiology/Sepsis markers: Results for orders placed or performed during the hospital encounter of 04/17/22  Urine Culture     Status: Abnormal   Collection Time: 04/18/22  2:00 PM   Specimen: Urine, Clean Catch  Result Value Ref Range Status   Specimen  Description   Final    URINE, CLEAN CATCH Performed at [facility], [address] [place], [place] [address]    Special Requests   Final    NONE Performed at [facility], [address] [place], [place] [address]    Culture >=100,000 COLONIES/mL KLEBSIELLA PNEUMONIAE (A)  Final   Report Status 04/20/2022 FINAL  Final   Organism ID, Bacteria KLEBSIELLA PNEUMONIAE (A)  Final      Susceptibility   Klebsiella pneumoniae - MIC*    AMPICILLIN RESISTANT Resistant     CEFAZOLIN <=4 SENSITIVE Sensitive     CEFEPIME <=0.12 SENSITIVE Sensitive     CEFTRIAXONE <=0.25 SENSITIVE Sensitive     CIPROFLOXACIN <=0.25 SENSITIVE Sensitive     GENTAMICIN <=1 SENSITIVE Sensitive     IMIPENEM <=0.25 SENSITIVE Sensitive     NITROFURANTOIN <=16 SENSITIVE Sensitive     TRIMETH/SULFA <=20 SENSITIVE Sensitive     AMPICILLIN/SULBACTAM <=2 SENSITIVE Sensitive     PIP/TAZO <=4 SENSITIVE Sensitive     * >=100,000 COLONIES/mL KLEBSIELLA PNEUMONIAE  MRSA Next Gen by PCR, Nasal     Status: None   Collection Time: 04/20/22  4:52 PM   Specimen: Nasal Mucosa; Nasal Swab  Result Value Ref Range Status   MRSA by PCR Next Gen NOT DETECTED NOT DETECTED Final    Comment: (NOTE) The GeneXpert MRSA Assay (FDA approved for NASAL specimens only), is one component of a comprehensive MRSA colonization surveillance program. It is not intended to diagnose MRSA infection nor to guide or monitor treatment for MRSA infections. Test performance is not FDA approved in patients less than 46 years old. Performed at [facility], [address] [place], [place] [address]   Culture, blood (Routine X 2) w Reflex  to ID Panel     Status: None (Preliminary result)   Collection Time: 04/20/22  5:25 PM   Specimen: BLOOD  Result Value Ref Range Status   Specimen Description BLOOD BLOOD LEFT ARM  Final   Special Requests   Final    BOTTLES DRAWN AEROBIC AND ANAEROBIC Blood Culture adequate volume    Culture   Final    NO GROWTH 2 DAYS Performed at [facility], [address] [place], [place] [address]    Report Status PENDING  Incomplete  Culture, blood (Routine X 2) w Reflex to ID Panel     Status: None (Preliminary result)   Collection Time: 04/20/22  5:33 PM   Specimen: BLOOD  Result Value Ref Range Status   Specimen Description BLOOD BLOOD RIGHT HAND  Final   Special Requests   Final    BOTTLES DRAWN AEROBIC AND ANAEROBIC Blood Culture adequate volume   Culture   Final    NO GROWTH 2 DAYS Performed at [facility], [address], [place], [place] [address]    Report Status PENDING  Incomplete  Resp panel by RT-PCR (RSV, Flu A&B, Covid) Anterior Nasal Swab     Status: None   Collection Time: 04/21/22 10:22 PM   Specimen: Anterior Nasal Swab  Result Value Ref Range Status   SARS Coronavirus 2 by RT PCR NEGATIVE NEGATIVE Final    Comment: (NOTE) SARS-CoV-2 target nucleic acids are NOT DETECTED.  The SARS-CoV-2 RNA is generally detectable in upper respiratory specimens during the acute phase of infection. The lowest concentration of SARS-CoV-2 viral copies this assay can detect is 138 copies/mL. A negative result does not preclude SARS-Cov-2 infection and should not be used as the sole basis for treatment or other patient management decisions. A negative result may occur with  improper specimen collection/handling, submission of specimen other than nasopharyngeal swab, presence of viral mutation(s) within the areas targeted by this assay, and inadequate number of viral copies(<138 copies/mL). A negative result must be combined with clinical observations, patient history, and epidemiological information. The expected result is Negative.  Fact Sheet for Patients:  EntrepreneurPulse.com.au  Fact Sheet for Healthcare Providers:  IncredibleEmployment.be  This test is no t yet approved or cleared by the [place]  FDA and  has been authorized for detection and/or diagnosis of SARS-CoV-2 by FDA under an Emergency Use Authorization (EUA). This EUA will remain  in effect (meaning this test can be used) for the duration of the COVID-19 declaration under Section 564(b)(1) of the Act, 21 U.S.C.section 360bbb-3(b)(1), unless the authorization is terminated  or revoked sooner.       Influenza A by PCR NEGATIVE NEGATIVE Final   Influenza B by PCR NEGATIVE NEGATIVE Final    Comment: (NOTE) The Xpert Xpress SARS-CoV-2/FLU/RSV plus assay is intended as an aid in the diagnosis of influenza from Nasopharyngeal swab specimens and should not be used as a sole basis for treatment. Nasal washings and aspirates are unacceptable for Xpert Xpress SARS-CoV-2/FLU/RSV testing.  Fact Sheet for Patients: EntrepreneurPulse.com.au  Fact Sheet for Healthcare Providers: IncredibleEmployment.be  This test is not yet approved or cleared by the [place] FDA and has been authorized for detection and/or diagnosis of SARS-CoV-2 by FDA under an Emergency Use Authorization (EUA). This EUA will remain in effect (meaning this test can be used) for the duration of the COVID-19 declaration under Section 564(b)(1) of the Act, 21 U.S.C. section 360bbb-3(b)(1), unless the authorization is terminated or revoked.  Resp Syncytial Virus by PCR NEGATIVE NEGATIVE Final    Comment: (NOTE) Fact Sheet for Patients: EntrepreneurPulse.com.au  Fact Sheet for Healthcare Providers: IncredibleEmployment.be  This test is not yet approved or cleared by the [place] FDA and has been authorized for detection and/or diagnosis of SARS-CoV-2 by FDA under an Emergency Use Authorization (EUA). This EUA will remain in effect (meaning this test can be used) for the duration of the COVID-19 declaration under Section 564(b)(1) of the Act, 21 U.S.C. section  360bbb-3(b)(1), unless the authorization is terminated or revoked.  Performed at [facility], [address], [place], [place] [address]     Anti-infectives:  Anti-infectives (From admission, onward)    Start     Dose/Rate Route Frequency Ordered Stop   04/19/22 1815  cefTRIAXone (ROCEPHIN) 1 g in sodium chloride 0.9 % 100 mL IVPB        1 g 200 mL/hr over 30 Minutes Intravenous Every 24 hours 04/19/22 1715     04/19/22 1815  azithromycin (ZITHROMAX) 500 mg in sodium chloride 0.9 % 250 mL IVPB        500 mg 250 mL/hr over 60 Minutes Intravenous Every 24 hours 04/19/22 1715     04/18/22 1500  doxycycline (VIBRAMYCIN) 100 mg in sodium chloride 0.9 % 250 mL IVPB  Status:  Discontinued        100 mg 125 mL/hr over 120 Minutes Intravenous Every 12 hours 04/18/22 1344 04/19/22 1715        Consults: Treatment Team:  Annita Brod, MD [facility], [facility], MD    PAST MEDICAL HISTORY   Past Medical History:  Diagnosis Date   Diabetes mellitus without complication ([place])    GERD (gastroesophageal reflux disease)    Hypertension    Renal disorder    stage 3      SURGICAL HISTORY   Past Surgical History:  Procedure Laterality Date   DILATION AND CURETTAGE OF UTERUS     Miscarriage   EYE SURGERY     bilateral caterac   FINGER SURGERY     KNEE ARTHROSCOPY Left 1989   [facility]   PILONIDAL CYST DRAINAGE       FAMILY HISTORY   Family History  Problem Relation Age of Onset   Diabetes Mother      SOCIAL HISTORY   Social History   Tobacco Use   Smoking status: Never   Smokeless tobacco: Never  Vaping Use   Vaping Use: Never used  Substance Use Topics   Alcohol use: Yes    Alcohol/week: 6.0 standard drinks of alcohol    Types: 3 Cans of beer, 3 Shots of liquor per week    Comment: heavy drinker   Drug use: No     MEDICATIONS   Current Medication:  Current Facility-Administered Medications:    0.45 % sodium chloride infusion, , Intravenous,  Continuous, Annita Brod, MD, Last Rate: 100 mL/hr at 04/22/22 0723, Infusion Verify at 04/22/22 7858   acetaminophen (TYLENOL) tablet 650 mg, 650 mg, Oral, Q6H PRN **OR** acetaminophen (TYLENOL) suppository 650 mg, 650 mg, Rectal, Q6H PRN, Annita Brod, MD   azithromycin (ZITHROMAX) 500 mg in sodium chloride 0.9 % 250 mL IVPB, 500 mg, Intravenous, Q24H, Annita Brod, MD, Stopped at 04/21/22 1907   cefTRIAXone (ROCEPHIN) 1 g in sodium chloride 0.9 % 100 mL IVPB, 1 g, Intravenous, Q24H, Annita Brod, MD, Stopped at 04/21/22 1758   Chlorhexidine Gluconate Cloth 2 % PADS 6 each, 6 each,  Topical, Daily, Annita Brod, MD, 6 each at 04/22/22 0507   dexmedetomidine (PRECEDEX) 400 MCG/100ML (4 mcg/mL) infusion, 0-1.2 mcg/kg/hr, Intravenous, Titrated, Annita Brod, MD, Last Rate: 9.3 mL/hr at 04/22/22 0723, 0.4 mcg/kg/hr at 04/22/22 0723   docusate (COLACE) 50 MG/5ML liquid 100 mg, 100 mg, Per Tube, BID, Teressa Lower, NP   enoxaparin (LOVENOX) injection 40 mg, 40 mg, Subcutaneous, Q24H, Annita Brod, MD, 40 mg at 04/21/22 0848   fentaNYL (SUBLIMAZE) injection 25 mcg, 25 mcg, Intravenous, Q15 min PRN, Teressa Lower, NP   fentaNYL (SUBLIMAZE) injection 25-100 mcg, 25-100 mcg, Intravenous, Q30 min PRN, Teressa Lower, NP   insulin glargine-yfgn (SEMGLEE) injection 12 Units, 12 Units, Subcutaneous, Daily, Annita Brod, MD, 12 Units at 04/21/22 1610   labetalol (NORMODYNE) injection 10 mg, 10 mg, Intravenous, Q2H PRN, Lang Snow, NP, 10 mg at 04/21/22 1603   magnesium hydroxide (MILK OF MAGNESIA) suspension 30 mL, 30 mL, Oral, Daily PRN, Annita Brod, MD   metoCLOPramide (REGLAN) injection 5 mg, 5 mg, Intravenous, Q8H, Annita Brod, MD, 5 mg at 04/22/22 0636   ondansetron (ZOFRAN) tablet 4 mg, 4 mg, Oral, Q6H PRN **OR** ondansetron (ZOFRAN) injection 4 mg, 4 mg, Intravenous, Q6H PRN, Annita Brod, MD   pantoprazole (PROTONIX)  injection 40 mg, 40 mg, Intravenous, Q24H, Annita Brod, MD, 40 mg at 04/21/22 0849   thiamine (VITAMIN B1) 500 mg in normal saline (50 mL) IVPB, 500 mg, Intravenous, Daily **FOLLOWED BY** [START ON 04/26/2022] thiamine (VITAMIN B1) injection 100 mg, 100 mg, Intravenous, Daily, Teressa Lower, NP   tiZANidine (ZANAFLEX) tablet 4 mg, 4 mg, Oral, TID PRN, Annita Brod, MD   traZODone (DESYREL) tablet 25 mg, 25 mg, Oral, QHS PRN, Annita Brod, MD    ALLERGIES   Ibuprofen, Nsaids, Brimonidine, and Latex    REVIEW OF SYSTEMS    Unable to obtain due to unresponsive mental status  PHYSICAL EXAMINATION   Vital Signs: Temp:  [97.8 F (36.6 C)-100.6 F (38.1 C)] 97.8 F (36.6 C) (01/02 0730) Pulse Rate:  [69-122] 100 (01/02 0800) Resp:  [12-32] 15 (01/02 0800) BP: (102-168)/(50-91) 111/52 (01/02 0800) SpO2:  [91 %-100 %] 100 % (01/02 0800) FiO2 (%):  [35 %-60 %] 35 % (01/02 0751)  GENERAL: unresponsive , age appropriate HEAD: Normocephalic, atraumatic.  EYES: Pupils equal, round, reactive to light.  No scleral icterus.  MOUTH: Dry mucosal membranes with dark dry blood in oral cavity NECK: Supple. No thyromegaly. No nodules. No JVD.  PULMONARY: rhonchi bilaterally  CARDIOVASCULAR: S1 and S2. Regular rate and rhythm. No murmurs, rubs, or gallops.  GASTROINTESTINAL: Soft, nontender, non-distended. No masses. Positive bowel sounds. No hepatosplenomegaly.  MUSCULOSKELETAL: No swelling, clubbing, or edema.  NEUROLOGIC: GCS7 with jerky muscular movements SKIN:intact,warm,dry   PERTINENT DATA     Infusions:  sodium chloride 100 mL/hr at 04/22/22 0723   azithromycin Stopped (04/21/22 1907)   cefTRIAXone (ROCEPHIN)  IV Stopped (04/21/22 1758)   dexmedetomidine (PRECEDEX) IV infusion 0.4 mcg/kg/hr (04/22/22 0723)   thiamine (VITAMIN B1) injection     Scheduled Medications:  Chlorhexidine Gluconate Cloth  6 each Topical Daily   docusate  100 mg Per Tube BID    enoxaparin (LOVENOX) injection  40 mg Subcutaneous Q24H   insulin glargine-yfgn  12 Units Subcutaneous Daily   metoCLOPramide (REGLAN) injection  5 mg Intravenous Q8H   pantoprazole (PROTONIX) IV  40 mg Intravenous Q24H   [START ON 04/26/2022] thiamine (  VITAMIN B1) injection  100 mg Intravenous Daily   PRN Medications: acetaminophen **OR** acetaminophen, fentaNYL (SUBLIMAZE) injection, fentaNYL (SUBLIMAZE) injection, labetalol, magnesium hydroxide, ondansetron **OR** ondansetron (ZOFRAN) IV, tiZANidine, traZODone Hemodynamic parameters:   Intake/Output: 01/01 0701 - 01/02 0700 In: 2487.3 [I.V.:2280.5; IV Piggyback:206.8] Out: 750 [Urine:750]  Ventilator  Settings: Vent Mode: PRVC FiO2 (%):  [35 %-60 %] 35 % Set Rate:  [15 bmp] 15 bmp Vt Set:  [500 mL] 500 mL PEEP:  [5 cmH20] 5 cmH20 Plateau Pressure:  [15 cmH20] 15 cmH20   LAB RESULTS:  Basic Metabolic Panel: Recent Labs  Lab 04/18/22 0044 04/18/22 0931 04/19/22 0435 04/20/22 0539 04/21/22 0145 04/21/22 1317 04/22/22 0415  NA  --  137 143 147* 143  --  141  K 4.7 4.8 4.0 3.7 3.8  --  3.3*  CL  --  104 115* 115* 113*  --  110  CO2  --  18* 21* 22 22  --  19*  GLUCOSE  --  458* 146* 121* 112*  --  138*  BUN  --  33* 30* 24* 23  --  28*  CREATININE  --  2.74* 2.03* 1.54* 1.56*  --  1.63*  CALCIUM  --  9.3 8.9 8.7* 8.2*  --  7.9*  MG 1.4*  --  1.4*  --  1.2* 2.3 2.1  PHOS  --   --  2.6  --  3.4  --  2.7   Liver Function Tests: Recent Labs  Lab 04/17/22 1445 04/19/22 0435  AST 29 21  ALT 13 7  ALKPHOS 52 40  BILITOT 1.8* 0.6  PROT 8.4* 7.4  ALBUMIN 3.8 3.3*   No results for input(s): "LIPASE", "AMYLASE" in the last 168 hours. Recent Labs  Lab 04/18/22 0044  AMMONIA <10   CBC: Recent Labs  Lab 04/17/22 1445 04/19/22 0435 04/20/22 0539 04/21/22 0145 04/22/22 0415  WBC 11.6* 12.8* 11.5* 14.1* 11.5*  HGB 11.9* 10.1* 10.6* 11.1* 9.4*  HCT 35.1* 30.4* 32.5* 34.2* 28.8*  MCV 100.3* 102.0* 102.5* 102.7*  100.7*  PLT 375 334 356 341 322   Cardiac Enzymes: No results for input(s): "CKTOTAL", "CKMB", "CKMBINDEX", "TROPONINI" in the last 168 hours. BNP: Invalid input(s): "POCBNP" CBG: Recent Labs  Lab 04/21/22 1523 04/21/22 1931 04/21/22 2331 04/22/22 0336 04/22/22 0806  GLUCAP 115* 112* 120* 126* 128*       IMAGING RESULTS:  Imaging: DG Chest Port 1 View  Result Date: 04/21/2022 CLINICAL DATA:  Endotracheal tube placement. EXAM: PORTABLE CHEST 1 VIEW COMPARISON:  04/18/2022 FINDINGS: An endotracheal tube is noted just above the carina - recommend 2-3 cm retraction. The cardiomediastinal silhouette is unremarkable. There is no evidence of focal airspace disease, pulmonary edema, suspicious pulmonary nodule/mass, pleural effusion, or pneumothorax. No acute bony abnormalities are identified. IMPRESSION: 1. Endotracheal tube just above the carina - recommend 2-3 cm retraction. 2. No active disease. Electronically Signed   By: Margarette Canada M.D.   On: 04/21/2022 17:37   ECHOCARDIOGRAM COMPLETE  Result Date: 04/21/2022    ECHOCARDIOGRAM REPORT   Patient Name:   Laurie Lin Date of Exam: 04/20/2022 Medical Rec #:  [id number]       Height:       70.0 in Accession #:    [id number]      Weight:       205.0 lb Date of Birth:  [birth year]       BSA:          2.109 m  Patient Age:    1 years        BP:           159/79 mmHg Patient Gender: F               HR:           102 bpm. Exam Location:  [facility] Procedure: 2D Echo, Color Doppler and Cardiac Doppler Indications:     I50.31 congestive heart failure-Acute Diastolic  History:         Patient has no prior history of Echocardiogram examinations.                  Risk Factors:Hypertension and Diabetes.  Sonographer:     Charmayne Sheer Referring Phys:  [id number] Gammon [facility] Diagnosing Phys: Isaias Cowman MD  Sonographer Comments: Suboptimal apical window. Image acquisition challenging due to respiratory motion and and inability to awaken patient.  IMPRESSIONS  1. Left ventricular ejection fraction, by estimation, is 50 to 55%. The left ventricle has low normal function. The left ventricle has no regional wall motion abnormalities. Left ventricular diastolic parameters were normal.  2. Right ventricular systolic function is normal. The right ventricular size is normal.  3. The mitral valve is normal in structure. Trivial mitral valve regurgitation. No evidence of mitral stenosis.  4. The aortic valve is normal in structure. Aortic valve regurgitation is not visualized. No aortic stenosis is present.  5. The inferior vena cava is normal in size with greater than 50% respiratory variability, suggesting right atrial pressure of 3 mmHg. FINDINGS  Left Ventricle: Left ventricular ejection fraction, by estimation, is 50 to 55%. The left ventricle has low normal function. The left ventricle has no regional wall motion abnormalities. The left ventricular internal cavity size was normal in size. There is no left ventricular hypertrophy. Left ventricular diastolic parameters were normal. Right Ventricle: The right ventricular size is normal. No increase in right ventricular wall thickness. Right ventricular systolic function is normal. Left Atrium: Left atrial size was normal in size. Right Atrium: Right atrial size was normal in size. Pericardium: There is no evidence of pericardial effusion. Mitral Valve: The mitral valve is normal in structure. Trivial mitral valve regurgitation. No evidence of mitral valve stenosis. Tricuspid Valve: The tricuspid valve is normal in structure. Tricuspid valve regurgitation is trivial. No evidence of tricuspid stenosis. Aortic Valve: The aortic valve is normal in structure. Aortic valve regurgitation is not visualized. No aortic stenosis is present. Aortic valve mean gradient measures 4.0 mmHg. Aortic valve peak gradient measures 7.2 mmHg. Aortic valve area, by VTI measures 1.64 cm. Pulmonic Valve: The pulmonic valve was normal in  structure. Pulmonic valve regurgitation is not visualized. No evidence of pulmonic stenosis. Aorta: The aortic root is normal in size and structure. Venous: The inferior vena cava is normal in size with greater than 50% respiratory variability, suggesting right atrial pressure of 3 mmHg. IAS/Shunts: No atrial level shunt detected by color flow Doppler.  LEFT VENTRICLE PLAX 2D LVIDd:         4.40 cm   Diastology LVIDs:         3.20 cm   LV e' medial:    7.94 cm/s LV PW:         0.90 cm   LV E/e' medial:  10.3 LV IVS:        0.80 cm   LV e' lateral:   8.27 cm/s LVOT diam:     1.80 cm   LV  E/e' lateral: 9.9 LV SV:         39 LV SV Index:   18 LVOT Area:     2.54 cm  RIGHT VENTRICLE RV Basal diam:  3.00 cm RV S prime:     19.00 cm/s LEFT ATRIUM             Index        RIGHT ATRIUM           Index LA diam:        3.30 cm 1.56 cm/m   RA Area:     12.90 cm LA Vol (A2C):   32.6 ml 15.45 ml/m  RA Volume:   27.20 ml  12.89 ml/m LA Vol (A4C):   54.9 ml 26.03 ml/m LA Biplane Vol: 45.8 ml 21.71 ml/m  AORTIC VALVE                    PULMONIC VALVE AV Area (Vmax):    1.96 cm     PV Vmax:       1.03 m/s AV Area (Vmean):   1.90 cm     PV Vmean:      63.500 cm/s AV Area (VTI):     1.64 cm     PV VTI:        0.171 m AV Vmax:           134.00 cm/s  PV Peak grad:  4.2 mmHg AV Vmean:          93.800 cm/s  PV Mean grad:  2.0 mmHg AV VTI:            0.237 m AV Peak Grad:      7.2 mmHg AV Mean Grad:      4.0 mmHg LVOT Vmax:         103.00 cm/s LVOT Vmean:        70.200 cm/s LVOT VTI:          0.153 m LVOT/AV VTI ratio: 0.65  AORTA Ao Root diam: 3.10 cm MITRAL VALVE MV Area (PHT): 5.66 cm    SHUNTS MV Decel Time: 134 msec    Systemic VTI:  0.15 m MV E velocity: 81.80 cm/s  Systemic Diam: 1.80 cm MV A velocity: 81.80 cm/s MV E/A ratio:  1.00 Isaias Cowman MD Electronically signed by Isaias Cowman MD Signature Date/Time: 04/21/2022/9:57:17 AM    Final    _0 @ DG Chest Port 1 View  Result Date: 04/21/2022 CLINICAL  DATA:  Endotracheal tube placement. EXAM: PORTABLE CHEST 1 VIEW COMPARISON:  04/18/2022 FINDINGS: An endotracheal tube is noted just above the carina - recommend 2-3 cm retraction. The cardiomediastinal silhouette is unremarkable. There is no evidence of focal airspace disease, pulmonary edema, suspicious pulmonary nodule/mass, pleural effusion, or pneumothorax. No acute bony abnormalities are identified. IMPRESSION: 1. Endotracheal tube just above the carina - recommend 2-3 cm retraction. 2. No active disease. Electronically Signed   By: Margarette Canada M.D.   On: 04/21/2022 17:37     ASSESSMENT AND PLAN    -Multidisciplinary rounds held today  Altered mental status with encephalopathy   - obtain ABG   - blood cultures - patient has been on rocephin and zithromax -suspect due to alcoholism with withdrawal -possible septic encephalopathy due to Klebsiella UTI -urine culture noted -patient on multiple centrally acting Rx , zofran, dilaudid, ativan, precedex -thiamine/folate repletion -replete electrolytes -monitor for re-feeding  -requiring intubation on 1/1 due to acute obtundation -breathing comfortably on ACVC  and precedex gtt -high peaks and continued AMS during SAT today (1/2) -NCHCT pending  Delerium [place]ens  -patient may need to have endotracheal intubation due to unresponsive status with blood in oral cavity and aspiration risk with inability to protect airway -CIWA protocol  -[place] Alcohol withdrawal protocol   Renal Failure-CKD 3 -follow UO -continue Foley Catheter-assess need daily -dc non essential nephrotoxins -currently with UTI  ID -continue IV abx as prescibed -follow up cultures  GI/Nutrition GI PROPHYLAXIS as indicated DIET-->TF's as tolerated Constipation protocol as indicated  ENDO - ICU hypoglycemic\Hyperglycemia protocol -check FSBS per protocol   ELECTROLYTES -follow labs as needed -replace as needed -pharmacy consultation   DVT/GI PRX  ordered -SCDs  TRANSFUSIONS AS NEEDED MONITOR FSBS ASSESS the need for LABS as needed  Critical care provider statement:   Total critical care time: 45 minutes   Arcelia Jew MD Pulmonary & Critical Care Medicine

## 2022-04-22 NOTE — Progress Notes (Signed)
Traveled to [place] and back with RT Merrilee Seashore and this Therapist, sports. Uneventful.

## 2022-04-23 ENCOUNTER — Inpatient Hospital Stay: Payer: Medicare Other

## 2022-04-23 DIAGNOSIS — F1010 Alcohol abuse, uncomplicated: Secondary | ICD-10-CM | POA: Diagnosis not present

## 2022-04-23 DIAGNOSIS — G9340 Encephalopathy, unspecified: Secondary | ICD-10-CM | POA: Diagnosis not present

## 2022-04-23 DIAGNOSIS — J9601 Acute respiratory failure with hypoxia: Secondary | ICD-10-CM

## 2022-04-23 LAB — CBC
HCT: 31.3 % — ABNORMAL LOW (ref 36.0–46.0)
Hemoglobin: 10.4 g/dL — ABNORMAL LOW (ref 12.0–15.0)
MCH: 33.4 pg (ref 26.0–34.0)
MCHC: 33.2 g/dL (ref 30.0–36.0)
MCV: 100.6 fL — ABNORMAL HIGH (ref 80.0–100.0)
Platelets: 341 10*3/uL (ref 150–400)
RBC: 3.11 MIL/uL — ABNORMAL LOW (ref 3.87–5.11)
RDW: 12.2 % (ref 11.5–15.5)
WBC: 10.5 10*3/uL (ref 4.0–10.5)
nRBC: 0 % (ref 0.0–0.2)

## 2022-04-23 LAB — URINALYSIS, COMPLETE (UACMP) WITH MICROSCOPIC
Bilirubin Urine: NEGATIVE
Glucose, UA: NEGATIVE mg/dL
Hgb urine dipstick: NEGATIVE
Ketones, ur: NEGATIVE mg/dL
Nitrite: NEGATIVE
Protein, ur: 30 mg/dL — AB
Specific Gravity, Urine: 1.018 (ref 1.005–1.030)
WBC, UA: >50 WBC/hpf — ABNORMAL HIGH (ref 0–5)
pH: 5 (ref 5.0–8.0)

## 2022-04-23 LAB — BASIC METABOLIC PANEL
Anion gap: 6 (ref 5–15)
BUN: 30 mg/dL — ABNORMAL HIGH (ref 8–23)
CO2: 18 mmol/L — ABNORMAL LOW (ref 22–32)
Calcium: 8 mg/dL — ABNORMAL LOW (ref 8.9–10.3)
Chloride: 113 mmol/L — ABNORMAL HIGH (ref 98–111)
Creatinine, Ser: 1.35 mg/dL — ABNORMAL HIGH (ref 0.44–1.00)
GFR, Estimated: 43 mL/min — ABNORMAL LOW (ref >60)
Glucose, Bld: 183 mg/dL — ABNORMAL HIGH (ref 70–99)
Potassium: 3.6 mmol/L (ref 3.5–5.1)
Sodium: 137 mmol/L (ref 135–145)

## 2022-04-23 LAB — GLUCOSE, CAPILLARY
Glucose-Capillary: 137 mg/dL — ABNORMAL HIGH (ref 70–99)
Glucose-Capillary: 171 mg/dL — ABNORMAL HIGH (ref 70–99)
Glucose-Capillary: 204 mg/dL — ABNORMAL HIGH (ref 70–99)
Glucose-Capillary: 141 mg/dL — ABNORMAL HIGH (ref 70–99)
Glucose-Capillary: 145 mg/dL — ABNORMAL HIGH (ref 70–99)
Glucose-Capillary: 135 mg/dL — ABNORMAL HIGH (ref 70–99)

## 2022-04-23 LAB — BASIC METABOLIC PANEL WITH GFR
Anion gap: 10 (ref 5–15)
BUN: 25 mg/dL — ABNORMAL HIGH (ref 8–23)
CO2: 16 mmol/L — ABNORMAL LOW (ref 22–32)
Calcium: 8 mg/dL — ABNORMAL LOW (ref 8.9–10.3)
Chloride: 108 mmol/L (ref 98–111)
Creatinine, Ser: 1.17 mg/dL — ABNORMAL HIGH (ref 0.44–1.00)
GFR, Estimated: 51 mL/min — ABNORMAL LOW
Glucose, Bld: 165 mg/dL — ABNORMAL HIGH (ref 70–99)
Potassium: 2.8 mmol/L — ABNORMAL LOW (ref 3.5–5.1)
Sodium: 134 mmol/L — ABNORMAL LOW (ref 135–145)

## 2022-04-23 LAB — MAGNESIUM: Magnesium: 2 mg/dL (ref 1.7–2.4)

## 2022-04-23 LAB — PHOSPHORUS: Phosphorus: 2.3 mg/dL — ABNORMAL LOW (ref 2.5–4.6)

## 2022-04-23 MED ORDER — LACTATED RINGERS IV BOLUS
500.0000 mL | Freq: Once | INTRAVENOUS | Status: AC
  Administered 2022-04-23: 500 mL via INTRAVENOUS

## 2022-04-23 MED ORDER — INSULIN ASPART 100 UNIT/ML IJ SOLN
0.0000 [IU] | INTRAMUSCULAR | Status: DC
  Administered 2022-04-23 (×2): 2 [IU] via SUBCUTANEOUS
  Administered 2022-04-24: 3 [IU] via SUBCUTANEOUS
  Administered 2022-04-24: 5 [IU] via SUBCUTANEOUS
  Administered 2022-04-24 (×2): 3 [IU] via SUBCUTANEOUS
  Administered 2022-04-24: 2 [IU] via SUBCUTANEOUS
  Administered 2022-04-24: 8 [IU] via SUBCUTANEOUS
  Filled 2022-04-23 (×8): qty 1

## 2022-04-23 MED ORDER — INSULIN ASPART 100 UNIT/ML IJ SOLN
0.0000 [IU] | Freq: Three times a day (TID) | INTRAMUSCULAR | Status: DC
  Administered 2022-04-23: 2 [IU] via SUBCUTANEOUS
  Administered 2022-04-23: 5 [IU] via SUBCUTANEOUS
  Filled 2022-04-23 (×2): qty 1

## 2022-04-23 MED ORDER — SENNA 8.6 MG PO TABS
1.0000 | ORAL_TABLET | Freq: Every day | ORAL | Status: DC
  Administered 2022-04-23: 8.6 mg
  Filled 2022-04-23: qty 1

## 2022-04-23 MED ORDER — POTASSIUM CHLORIDE 10 MEQ/100ML IV SOLN
10.0000 meq | INTRAVENOUS | Status: AC
  Administered 2022-04-23 (×2): 10 meq via INTRAVENOUS
  Filled 2022-04-23 (×2): qty 100

## 2022-04-23 MED ORDER — POLYETHYLENE GLYCOL 3350 17 G PO PACK
17.0000 g | PACK | Freq: Every day | ORAL | Status: DC
  Administered 2022-04-23: 17 g
  Filled 2022-04-23: qty 1

## 2022-04-23 MED ORDER — POTASSIUM CHLORIDE 20 MEQ PO PACK
40.0000 meq | PACK | Freq: Once | ORAL | Status: AC
  Administered 2022-04-23: 40 meq
  Filled 2022-04-23: qty 2

## 2022-04-23 NOTE — Progress Notes (Addendum)
CRITICAL CARE PROGRESS NOTE    Name: Laurie Lin MRN: [id number] DOB: 02-12-1956     LOS: 5   SUBJECTIVE FINDINGS & SIGNIFICANT EVENTS    HPI: This is a 67 yo F with hx of alcoholism, she drinks several beers and liquor drinks daily chronically.  She also has background of HTN, DM1, CKD3, who came in with severe hyperglycemia, AMS encephalopathy, noted to have hematemesis at home and has dry blood in mouth during my examination. She has been on the medical floor with treatment for past 3 days.  She has been receiving therapy for DM, UTI due to klebsiella, Wernickes encephalopathy, AKI, and bronchitis.  While on floor she continued to deteriorate and has become unresponsive with jerky movements. PCCM consultation for concern patient developing Delerium Tremens.     04/21/22- patient is still poorly responsive with signs of EtOH withdrawal. MRI brain with alcoholic encephalopathy.  I met with family today and reviewed care plan.   Intubated due to worsening obtundation  04/22/22- SAT showed continued altered mental status, vent dyssynchrony, and high peak pressures.   04/23/22-Precedex weaned off, patient became agitated and unable to tolerate SAT. Sedation resumed. Hypothermic this morning to 34.5 degrees celsius. Bear hugger in place.    Lines/tubes : External Urinary Catheter (Active)  Collection Container Dedicated Suction Canister 04/20/22 0800  Suction (Verified suction is between 40-80 mmHg) Yes 04/20/22 0800  Securement Method None needed 04/20/22 0800  Site Assessment Clean, Dry, Intact 04/20/22 0800  Intervention Female External Urinary Catheter Replaced 04/20/22 0800    Microbiology/Sepsis markers: Results for orders placed or performed during the hospital encounter of 04/17/22  Urine Culture      Status: Abnormal   Collection Time: 04/18/22  2:00 PM   Specimen: Urine, Clean Catch  Result Value Ref Range Status   Specimen Description   Final    URINE, CLEAN CATCH Performed at [facility], [address], [place], [place] [address]    Special Requests   Final    NONE Performed at [facility], [address] [place], [place] [address]    Culture >=100,000 COLONIES/mL KLEBSIELLA PNEUMONIAE (A)  Final   Report Status 04/20/2022 FINAL  Final   Organism ID, Bacteria KLEBSIELLA PNEUMONIAE (A)  Final      Susceptibility   Klebsiella pneumoniae - MIC*    AMPICILLIN RESISTANT Resistant     CEFAZOLIN <=4 SENSITIVE Sensitive     CEFEPIME <=0.12 SENSITIVE Sensitive     CEFTRIAXONE <=0.25 SENSITIVE Sensitive     CIPROFLOXACIN <=0.25 SENSITIVE Sensitive     GENTAMICIN <=1 SENSITIVE Sensitive     IMIPENEM <=0.25 SENSITIVE Sensitive     NITROFURANTOIN <=16 SENSITIVE Sensitive     TRIMETH/SULFA <=20 SENSITIVE Sensitive     AMPICILLIN/SULBACTAM <=2 SENSITIVE Sensitive     PIP/TAZO <=4 SENSITIVE Sensitive     * >=100,000 COLONIES/mL KLEBSIELLA PNEUMONIAE  MRSA Next Gen by PCR, Nasal     Status: None   Collection Time: 04/20/22  4:52 PM   Specimen: Nasal Mucosa; Nasal Swab  Result Value Ref Range Status   MRSA by PCR Next Gen NOT DETECTED NOT DETECTED Final    Comment: (NOTE) The GeneXpert MRSA Assay (FDA approved for NASAL specimens only), is one component of a comprehensive MRSA colonization surveillance program. It is not intended to diagnose MRSA infection nor to guide or monitor treatment for MRSA infections. Test performance is not FDA approved in patients less than 62 years old. Performed  at [facility], [address] [place], [place] [address]   Culture, blood (Routine X 2) w Reflex to ID Panel     Status: None (Preliminary result)   Collection Time: 04/20/22  5:25 PM   Specimen: BLOOD  Result Value Ref Range Status   Specimen Description  BLOOD BLOOD LEFT ARM  Final   Special Requests   Final    BOTTLES DRAWN AEROBIC AND ANAEROBIC Blood Culture adequate volume   Culture   Final    NO GROWTH 3 DAYS Performed at [facility], [address], [place], [place] [address]    Report Status PENDING  Incomplete  Culture, blood (Routine X 2) w Reflex to ID Panel     Status: None (Preliminary result)   Collection Time: 04/20/22  5:33 PM   Specimen: BLOOD  Result Value Ref Range Status   Specimen Description BLOOD BLOOD RIGHT HAND  Final   Special Requests   Final    BOTTLES DRAWN AEROBIC AND ANAEROBIC Blood Culture adequate volume   Culture   Final    NO GROWTH 3 DAYS Performed at [facility], [address] [place], [place] [address]    Report Status PENDING  Incomplete  Resp panel by RT-PCR (RSV, Flu A&B, Covid) Anterior Nasal Swab     Status: None   Collection Time: 04/21/22 10:22 PM   Specimen: Anterior Nasal Swab  Result Value Ref Range Status   SARS Coronavirus 2 by RT PCR NEGATIVE NEGATIVE Final    Comment: (NOTE) SARS-CoV-2 target nucleic acids are NOT DETECTED.  The SARS-CoV-2 RNA is generally detectable in upper respiratory specimens during the acute phase of infection. The lowest concentration of SARS-CoV-2 viral copies this assay can detect is 138 copies/mL. A negative result does not preclude SARS-Cov-2 infection and should not be used as the sole basis for treatment or other patient management decisions. A negative result may occur with  improper specimen collection/handling, submission of specimen other than nasopharyngeal swab, presence of viral mutation(s) within the areas targeted by this assay, and inadequate number of viral copies(<138 copies/mL). A negative result must be combined with clinical observations, patient history, and epidemiological information. The expected result is Negative.  Fact Sheet for Patients:  EntrepreneurPulse.com.au  Fact Sheet for  Healthcare Providers:  IncredibleEmployment.be  This test is no t yet approved or cleared by the [place] FDA and  has been authorized for detection and/or diagnosis of SARS-CoV-2 by FDA under an Emergency Use Authorization (EUA). This EUA will remain  in effect (meaning this test can be used) for the duration of the COVID-19 declaration under Section 564(b)(1) of the Act, 21 U.S.C.section 360bbb-3(b)(1), unless the authorization is terminated  or revoked sooner.       Influenza A by PCR NEGATIVE NEGATIVE Final   Influenza B by PCR NEGATIVE NEGATIVE Final    Comment: (NOTE) The Xpert Xpress SARS-CoV-2/FLU/RSV plus assay is intended as an aid in the diagnosis of influenza from Nasopharyngeal swab specimens and should not be used as a sole basis for treatment. Nasal washings and aspirates are unacceptable for Xpert Xpress SARS-CoV-2/FLU/RSV testing.  Fact Sheet for Patients: EntrepreneurPulse.com.au  Fact Sheet for Healthcare Providers: IncredibleEmployment.be  This test is not yet approved or cleared by the [place] FDA and has been authorized for detection and/or diagnosis of SARS-CoV-2 by FDA under an Emergency Use Authorization (EUA). This EUA will remain in effect (meaning this test can be used) for the duration of the COVID-19  declaration under Section 564(b)(1) of the Act, 21 U.S.C. section 360bbb-3(b)(1), unless the authorization is terminated or revoked.     Resp Syncytial Virus by PCR NEGATIVE NEGATIVE Final    Comment: (NOTE) Fact Sheet for Patients: EntrepreneurPulse.com.au  Fact Sheet for Healthcare Providers: IncredibleEmployment.be  This test is not yet approved or cleared by the [place] FDA and has been authorized for detection and/or diagnosis of SARS-CoV-2 by FDA under an Emergency Use Authorization (EUA). This EUA will remain in effect (meaning this  test can be used) for the duration of the COVID-19 declaration under Section 564(b)(1) of the Act, 21 U.S.C. section 360bbb-3(b)(1), unless the authorization is terminated or revoked.  Performed at [facility], [address] [place], [place] [address]     Anti-infectives:  Anti-infectives (From admission, onward)    Start     Dose/Rate Route Frequency Ordered Stop   04/19/22 1815  cefTRIAXone (ROCEPHIN) 1 g in sodium chloride 0.9 % 100 mL IVPB        1 g 200 mL/hr over 30 Minutes Intravenous Every 24 hours 04/19/22 1715     04/19/22 1815  azithromycin (ZITHROMAX) 500 mg in sodium chloride 0.9 % 250 mL IVPB        500 mg 250 mL/hr over 60 Minutes Intravenous Every 24 hours 04/19/22 1715     04/18/22 1500  doxycycline (VIBRAMYCIN) 100 mg in sodium chloride 0.9 % 250 mL IVPB  Status:  Discontinued        100 mg 125 mL/hr over 120 Minutes Intravenous Every 12 hours 04/18/22 1344 04/19/22 1715        Consults: Treatment Team:  Annita Brod, MD Pccm, [facility], MD    PAST MEDICAL HISTORY   Past Medical History:  Diagnosis Date   Diabetes mellitus without complication ([place])    GERD (gastroesophageal reflux disease)    Hypertension    Renal disorder    stage 3      SURGICAL HISTORY   Past Surgical History:  Procedure Laterality Date   DILATION AND CURETTAGE OF UTERUS     Miscarriage   EYE SURGERY     bilateral caterac   FINGER SURGERY     KNEE ARTHROSCOPY Left 1989   [facility]   PILONIDAL CYST DRAINAGE       FAMILY HISTORY   Family History  Problem Relation Age of Onset   Diabetes Mother      SOCIAL HISTORY   Social History   Tobacco Use   Smoking status: Never   Smokeless tobacco: Never  Vaping Use   Vaping Use: Never used  Substance Use Topics   Alcohol use: Yes    Alcohol/week: 6.0 standard drinks of alcohol    Types: 3 Cans of beer, 3 Shots of liquor per week    Comment: heavy drinker   Drug use: No     MEDICATIONS    Current Medication:  Current Facility-Administered Medications:    acetaminophen (TYLENOL) tablet 650 mg, 650 mg, Per Tube, Q6H PRN **OR** acetaminophen (TYLENOL) suppository 650 mg, 650 mg, Rectal, Q6H PRN, Coulter, Carolyn, RPH   azithromycin (ZITHROMAX) 500 mg in sodium chloride 0.9 % 250 mL IVPB, 500 mg, Intravenous, Q24H, Annita Brod, MD, Stopped at 04/22/22 2005   cefTRIAXone (ROCEPHIN) 1 g in sodium chloride 0.9 % 100 mL IVPB, 1 g, Intravenous, Q24H, Annita Brod, MD, Stopped at 04/22/22 1856   Chlorhexidine Gluconate Cloth 2 % PADS 6 each, 6 each, Topical, Daily, [place] Pink, Sendil  K, MD, 6 each at 04/23/22 0430   dexmedetomidine (PRECEDEX) 400 MCG/100ML (4 mcg/mL) infusion, 0-1.2 mcg/kg/hr, Intravenous, Titrated, Annita Brod, MD, Last Rate: 11.63 mL/hr at 04/23/22 0830, 0.5 mcg/kg/hr at 04/23/22 0830   docusate (COLACE) 50 MG/5ML liquid 100 mg, 100 mg, Per Tube, BID, Teressa Lower, NP, 100 mg at 04/23/22 0952   enoxaparin (LOVENOX) injection 40 mg, 40 mg, Subcutaneous, Q24H, Annita Brod, MD, 40 mg at 04/23/22 4132   feeding supplement (PROSource TF20) liquid 60 mL, 60 mL, Per Tube, Daily, Nam, Earley Abide, MD, 60 mL at 04/23/22 4401   feeding supplement (VITAL AF 1.2 CAL) liquid 1,000 mL, 1,000 mL, Per Tube, Continuous, Nam, Earley Abide, MD, Last Rate: 40 mL/hr at 04/23/22 0938, Rate Change at 04/23/22 0938   fentaNYL (SUBLIMAZE) injection 25 mcg, 25 mcg, Intravenous, Q15 min PRN, Teressa Lower, NP   fentaNYL (SUBLIMAZE) injection 25-100 mcg, 25-100 mcg, Intravenous, Q30 min PRN, Teressa Lower, NP, 100 mcg at 02/72/53 6644   folic acid injection 1 mg, 1 mg, Intravenous, Daily, Nam, Earley Abide, MD, 1 mg at 04/23/22 0347   free water 30 mL, 30 mL, Per Tube, Q4H, Bronson Ing, MD, 30 mL at 04/23/22 1219   insulin aspart (novoLOG) injection 0-15 Units, 0-15 Units, Subcutaneous, TID WC, Wynelle Cleveland, RPH, 5 Units at 04/23/22 1219   insulin glargine-yfgn (SEMGLEE)  injection 12 Units, 12 Units, Subcutaneous, Daily, Annita Brod, MD, 12 Units at 04/23/22 4259   labetalol (NORMODYNE) injection 10 mg, 10 mg, Intravenous, Q2H PRN, Lang Snow, NP, 10 mg at 04/21/22 1603   magnesium hydroxide (MILK OF MAGNESIA) suspension 30 mL, 30 mL, Per Tube, Daily PRN, Coulter, Hoyle Sauer, RPH   metoCLOPramide (REGLAN) injection 5 mg, 5 mg, Intravenous, Q8H, Annita Brod, MD, 5 mg at 04/23/22 1408   midazolam (VERSED) injection 1-2 mg, 1-2 mg, Intravenous, Q1H PRN, [place], Toribio Harbour L, NP, 2 mg at 04/23/22 0522   ondansetron (ZOFRAN) tablet 4 mg, 4 mg, Per Tube, Q6H PRN **OR** ondansetron (ZOFRAN) injection 4 mg, 4 mg, Intravenous, Q6H PRN, Coulter, Hoyle Sauer, RPH   Oral care mouth rinse, 15 mL, Mouth Rinse, Q2H, Nam, Earley Abide, MD, 15 mL at 04/23/22 1410   Oral care mouth rinse, 15 mL, Mouth Rinse, PRN, Nam, Earley Abide, MD   pantoprazole (PROTONIX) injection 40 mg, 40 mg, Intravenous, Q24H, Annita Brod, MD, 40 mg at 04/23/22 5638   polyethylene glycol (MIRALAX / GLYCOLAX) packet 17 g, 17 g, Per Tube, Daily, Wynelle Cleveland, RPH, 17 g at 04/23/22 1219   senna (SENOKOT) tablet 8.6 mg, 1 tablet, Per Tube, Daily, Wynelle Cleveland, RPH, 8.6 mg at 04/23/22 1219   thiamine (VITAMIN B1) 500 mg in normal saline (50 mL) IVPB, 500 mg, Intravenous, Daily, Last Rate: 100 mL/hr at 04/23/22 1048, 500 mg at 04/23/22 1048 **FOLLOWED BY** [START ON 04/26/2022] thiamine (VITAMIN B1) injection 100 mg, 100 mg, Intravenous, Daily, Teressa Lower, NP    ALLERGIES   Ibuprofen, Nsaids, Brimonidine, and Latex    REVIEW OF SYSTEMS    Unable to obtain due to unresponsive mental status  PHYSICAL EXAMINATION   Vital Signs: Temp:  [92.8 F (33.8 C)-99.3 F (37.4 C)] 97.9 F (36.6 C) (01/03 1400) Pulse Rate:  [25-83] 64 (01/03 1400) Resp:  [0-33] 17 (01/03 1400) BP: (83-162)/(49-72) 90/55 (01/03 1400) SpO2:  [91 %-100 %] 100 % (01/03 1400) FiO2 (%):  [30 %-35  %] 30 % (01/03  1212) Weight:  [88.9 kg] 88.9 kg (01/03 1144)  GENERAL: unresponsive , age appropriate HEAD: Normocephalic, atraumatic.  EYES: Pupils equal, round, reactive to light.  No scleral icterus.  MOUTH: Dry mucosal membranes with dark dry blood in oral cavity NECK: Supple. No thyromegaly. No nodules. No JVD.  PULMONARY: rhonchi bilaterally  CARDIOVASCULAR: S1 and S2. Regular rate and rhythm. No murmurs, rubs, or gallops.  GASTROINTESTINAL: Soft, nontender, non-distended. No masses. Positive bowel sounds. No hepatosplenomegaly.  MUSCULOSKELETAL: No swelling, clubbing, or edema.  NEUROLOGIC: GCS7 with jerky muscular movements SKIN:intact,warm,dry   PERTINENT DATA     Infusions:  azithromycin Stopped (04/22/22 2005)   cefTRIAXone (ROCEPHIN)  IV Stopped (04/22/22 1856)   dexmedetomidine (PRECEDEX) IV infusion 0.5 mcg/kg/hr (04/23/22 0830)   feeding supplement (VITAL AF 1.2 CAL) 40 mL/hr at 04/23/22 0938   thiamine (VITAMIN B1) injection 500 mg (04/23/22 1048)   Scheduled Medications:  Chlorhexidine Gluconate Cloth  6 each Topical Daily   docusate  100 mg Per Tube BID   enoxaparin (LOVENOX) injection  40 mg Subcutaneous Q24H   feeding supplement (PROSource TF20)  60 mL Per Tube Daily   folic acid  1 mg Intravenous Daily   free water  30 mL Per Tube Q4H   insulin aspart  0-15 Units Subcutaneous TID WC   insulin glargine-yfgn  12 Units Subcutaneous Daily   metoCLOPramide (REGLAN) injection  5 mg Intravenous Q8H   mouth rinse  15 mL Mouth Rinse Q2H   pantoprazole (PROTONIX) IV  40 mg Intravenous Q24H   polyethylene glycol  17 g Per Tube Daily   senna  1 tablet Per Tube Daily   [START ON 04/26/2022] thiamine (VITAMIN B1) injection  100 mg Intravenous Daily   PRN Medications: acetaminophen **OR** acetaminophen, fentaNYL (SUBLIMAZE) injection, fentaNYL (SUBLIMAZE) injection, labetalol, magnesium hydroxide, midazolam, ondansetron **OR** ondansetron (ZOFRAN) IV, mouth  rinse Hemodynamic parameters:   Intake/Output: 01/02 0701 - 01/03 0700 In: 3273.7 [I.V.:2656.2; NG/GT:385.7; IV Piggyback:231.8] Out: 1865 [Urine:1865]  Ventilator  Settings: Vent Mode: PSV;CPAP FiO2 (%):  [30 %-35 %] 30 % Set Rate:  [15 bmp] 15 bmp Vt Set:  [500 mL] 500 mL PEEP:  [5 cmH20] 5 cmH20 Pressure Support:  [8 cmH20] 8 cmH20 Plateau Pressure:  [15 cmH20-16 cmH20] 15 cmH20   LAB RESULTS:  Basic Metabolic Panel: Recent Labs  Lab 04/19/22 0435 04/20/22 0539 04/21/22 0145 04/21/22 1317 04/22/22 0415 04/23/22 0406  NA 143 147* 143  --  141 134*  K 4.0 3.7 3.8  --  3.3* 2.8*  CL 115* 115* 113*  --  110 108  CO2 21* 22 22  --  19* 16*  GLUCOSE 146* 121* 112*  --  138* 165*  BUN 30* 24* 23  --  28* 25*  CREATININE 2.03* 1.54* 1.56*  --  1.63* 1.17*  CALCIUM 8.9 8.7* 8.2*  --  7.9* 8.0*  MG 1.4*  --  1.2* 2.3 2.1 2.0  PHOS 2.6  --  3.4  --  2.7 2.3*    Liver Function Tests: Recent Labs  Lab 04/17/22 1445 04/19/22 0435  AST 29 21  ALT 13 7  ALKPHOS 52 40  BILITOT 1.8* 0.6  PROT 8.4* 7.4  ALBUMIN 3.8 3.3*    No results for input(s): "LIPASE", "AMYLASE" in the last 168 hours. Recent Labs  Lab 04/18/22 0044 04/22/22 0848  AMMONIA <10 38*    CBC: Recent Labs  Lab 04/19/22 0435 04/20/22 0539 04/21/22 0145 04/22/22 0415 04/23/22 0406  WBC  12.8* 11.5* 14.1* 11.5* 10.5  HGB 10.1* 10.6* 11.1* 9.4* 10.4*  HCT 30.4* 32.5* 34.2* 28.8* 31.3*  MCV 102.0* 102.5* 102.7* 100.7* 100.6*  PLT 334 356 341 322 341    Cardiac Enzymes: No results for input(s): "CKTOTAL", "CKMB", "CKMBINDEX", "TROPONINI" in the last 168 hours. BNP: Invalid input(s): "POCBNP" CBG: Recent Labs  Lab 04/22/22 2008 04/22/22 2353 04/23/22 0337 04/23/22 0751 04/23/22 1138  GLUCAP 139* 151* 137* 171* 204*    IMAGING RESULTS:  Imaging: DG Chest Port 1 View  Result Date: 04/23/2022 CLINICAL DATA:  Respiratory failure, endotracheal tube positioning. EXAM: PORTABLE CHEST 1  VIEW COMPARISON:  04/21/2022 FINDINGS: The endotracheal tube tip is 3.7 cm above the carina, satisfactorily positioned. Feeding tube tip in the stomach. Mild biapical pleuroparenchymal scarring. Linear subsegmental atelectasis or scarring at the left lung base. Cardiac and mediastinal margins appear normal. IMPRESSION: 1. The endotracheal tube tip is 3.7 cm above the carina, satisfactorily positioned. 2. Feeding tube tip in the stomach. Electronically Signed   By: Van Clines M.D.   On: 04/23/2022 11:59   DG Abd 1 View  Result Date: 04/22/2022 CLINICAL DATA:  Dyspnea, Dobbhoff placement EXAM: ABDOMEN - 1 VIEW COMPARISON:  CT 04/17/2022 FINDINGS: Feeding tube tip overlies the stomach. No evidence of bowel obstruction. No acute osseous abnormality. IMPRESSION: Feeding tube tip overlies the stomach. Electronically Signed   By: Maurine Simmering M.D.   On: 04/22/2022 14:12   CT HEAD WO CONTRAST (5MM)  Result Date: 04/22/2022 CLINICAL DATA:  Altered mental status. EXAM: CT HEAD WITHOUT CONTRAST TECHNIQUE: Contiguous axial images were obtained from the base of the skull through the vertex without intravenous contrast. RADIATION DOSE REDUCTION: This exam was performed according to the departmental dose-optimization program which includes automated exposure control, adjustment of the mA and/or kV according to patient size and/or use of iterative reconstruction technique. COMPARISON:  CT Head 04/18/22 FINDINGS: Brain: There is sequela of moderate chronic microvascular ischemic change with advanced generalized volume loss. Size and shape of the ventricular system is unchanged compared to recent prior head CT. No evidence of hemorrhage. No CT evidence of a new infarct. No extra-axial fluid collection. Vascular: No hyperdense vessel or unexpected calcification. Skull: Normal. Negative for fracture or focal lesion. Sinuses/Orbits: Bilateral lens replacement. Mild mucosal thickening bilateral maxillary and ethmoid sinuses.  Bilateral mastoid and middle ears are clear. Frothy secretions in the nasopharynx, nonspecific. Other: None. IMPRESSION: 1. No acute intracranial abnormality. 2. Sequela of moderate chronic microvascular ischemic change with advanced generalized volume loss. Electronically Signed   By: Marin Roberts M.D.   On: 04/22/2022 13:54   DG Chest Port 1 View  Result Date: 04/21/2022 CLINICAL DATA:  Endotracheal tube placement. EXAM: PORTABLE CHEST 1 VIEW COMPARISON:  04/18/2022 FINDINGS: An endotracheal tube is noted just above the carina - recommend 2-3 cm retraction. The cardiomediastinal silhouette is unremarkable. There is no evidence of focal airspace disease, pulmonary edema, suspicious pulmonary nodule/mass, pleural effusion, or pneumothorax. No acute bony abnormalities are identified. IMPRESSION: 1. Endotracheal tube just above the carina - recommend 2-3 cm retraction. 2. No active disease. Electronically Signed   By: Margarette Canada M.D.   On: 04/21/2022 17:37   _0 @ DG Chest Port 1 View  Result Date: 04/23/2022 CLINICAL DATA:  Respiratory failure, endotracheal tube positioning. EXAM: PORTABLE CHEST 1 VIEW COMPARISON:  04/21/2022 FINDINGS: The endotracheal tube tip is 3.7 cm above the carina, satisfactorily positioned. Feeding tube tip in the stomach. Mild biapical pleuroparenchymal scarring. Linear subsegmental atelectasis  or scarring at the left lung base. Cardiac and mediastinal margins appear normal. IMPRESSION: 1. The endotracheal tube tip is 3.7 cm above the carina, satisfactorily positioned. 2. Feeding tube tip in the stomach. Electronically Signed   By: Van Clines M.D.   On: 04/23/2022 11:59     ASSESSMENT AND PLAN   Acute hypoxemic respiratory failure w/possible aspiration  2/2 Altered mental status w/encephalopathy 2/2 Alcohol Abuse Concern for DTs --Required Intubation 04/21/22 due to worsening mental status --MRI 04/21/22: Significant for diffuse volume loss for age with  periventricular white matter hyperintensity. Nonspecific but compatible with Chronic Alcoholic Encephalopathy  --[facility] 04/22/22: No acute abnormality. Sequela of moderate chronic microvascular ischemic change with advanced generalized volume loss --Ammonia level 04/22/22: 38. Repeat in AM as it was rising --Continue Precedex gtt for sedation. Attempted SAT today 1/3 but patient became agitated, non-compliant and tachypneic. As the afternoon progressed, started to follow commands. --Wean sedation as tolerated to attempt SAT/SBT in AM --VAP Bundle --Received 3 days of Azithromycin 532m x3 days. Continues on Rocephin for total 5 days.  --thiamine/folate repletion --replete electrolytes --monitor for re-feeding   Hypothermia --Began early this morning at 0500. Recorded at 92.8 rectally --Bear hugger placed --Will send repeat blood cultures, urinalysis and sputum culture --Repeat CXR today unchanged  Acute on Chronic Kidney disease Stage III Klebsiella UTI --Baseline appears to be around 1.7 --On admission 2.58 --Now improving 1.17 today  --BMP daily --Avoid nephrotoxins --Monitor I&Os --Urine culture 12/29: +for Klebsiella. Continue Ceftriaxone for 5 days  Type I Diabetes w/Hyperglycemia --SSI and Long acting --HgbA1c 7.6%  Hypertension --No home medications listed --Currently normotensive  Hyponatremia --Na 134 this AM from 141 --On MIVFs at 125cc/hr which Im holding --Question if this an erroneus lab  --Repeating BMP this afternoon  Electrolyte derangements --Pharmacy is following for replacement  FEN: --Stop MIVFs --Per pharmacy --Enteral feeds  Prophylaxis: --GI: Pepcid --DVT: Lovenox --Bowel: Senna/Miralax  Patient discussed w/Dr.Gonzales and in agreement with plan:    STonye RoyaltyACNP-BC

## 2022-04-23 NOTE — Inpatient Diabetes Management (Signed)
Inpatient Diabetes Program Recommendations  AACE/ADA: New Consensus Statement on Inpatient Glycemic Control   Target Ranges:  Prepandial:   less than 140 mg/dL      Peak postprandial:   less than 180 mg/dL (1-2 hours)      Critically ill patients:  140 - 180 mg/dL    Latest Reference Range & Units 04/22/22 08:06 04/22/22 11:30 04/22/22 15:43 04/22/22 20:08 04/22/22 23:53 04/23/22 03:37 04/23/22 07:51  Glucose-Capillary 70 - 99 mg/dL 128 (H) 129 (H) 145 (H) 139 (H) 151 (H) 137 (H) 171 (H)   Review of Glycemic Control  Diabetes history: DM1 (does not make any insulin; requires basal, correction, and carbohydrate coverage insulin) Outpatient Diabetes medications: Basaglar 12 units QHS, Novolog 0-9 units TID with meals  Current orders for Inpatient glycemic control: Semglee 12 units daily   Inpatient Diabetes Program Recommendations:     Insulin: Please consider ordering Novolog 0-6 units Q4H.   Thanks, Barnie Alderman, RN, MSN, [facility] Coordinator Inpatient Diabetes Program [phone] (Team Pager from 8am to [place])

## 2022-04-23 NOTE — Progress Notes (Signed)
Pt had not voided since her foley was removed at 1130 today. Bladder scan only showed 77 mL, Tonye Royalty, NP made aware. She ordered an I&O cath. Catherter only removed 50 mL. MD Merrie Roof made aware of low urine output.

## 2022-04-24 DIAGNOSIS — G9340 Encephalopathy, unspecified: Secondary | ICD-10-CM | POA: Diagnosis not present

## 2022-04-24 LAB — BASIC METABOLIC PANEL
Anion gap: 10 (ref 5–15)
BUN: 35 mg/dL — ABNORMAL HIGH (ref 8–23)
CO2: 18 mmol/L — ABNORMAL LOW (ref 22–32)
Calcium: 8.4 mg/dL — ABNORMAL LOW (ref 8.9–10.3)
Chloride: 110 mmol/L (ref 98–111)
Creatinine, Ser: 1.55 mg/dL — ABNORMAL HIGH (ref 0.44–1.00)
GFR, Estimated: 36 mL/min — ABNORMAL LOW (ref >60)
Glucose, Bld: 221 mg/dL — ABNORMAL HIGH (ref 70–99)
Potassium: 3.8 mmol/L (ref 3.5–5.1)
Sodium: 138 mmol/L (ref 135–145)

## 2022-04-24 LAB — COMPREHENSIVE METABOLIC PANEL
ALT: 9 U/L (ref 0–44)
AST: 18 U/L (ref 15–41)
Albumin: 2.1 g/dL — ABNORMAL LOW (ref 3.5–5.0)
Alkaline Phosphatase: 87 U/L (ref 38–126)
Anion gap: 6 (ref 5–15)
BUN: 33 mg/dL — ABNORMAL HIGH (ref 8–23)
CO2: 18 mmol/L — ABNORMAL LOW (ref 22–32)
Calcium: 8 mg/dL — ABNORMAL LOW (ref 8.9–10.3)
Chloride: 114 mmol/L — ABNORMAL HIGH (ref 98–111)
Creatinine, Ser: 1.6 mg/dL — ABNORMAL HIGH (ref 0.44–1.00)
GFR, Estimated: 35 mL/min — ABNORMAL LOW (ref >60)
Glucose, Bld: 163 mg/dL — ABNORMAL HIGH (ref 70–99)
Potassium: 3.4 mmol/L — ABNORMAL LOW (ref 3.5–5.1)
Sodium: 138 mmol/L (ref 135–145)
Total Bilirubin: 0.6 mg/dL (ref 0.3–1.2)
Total Protein: 5.9 g/dL — ABNORMAL LOW (ref 6.5–8.1)

## 2022-04-24 LAB — CBC
HCT: 27.9 % — ABNORMAL LOW (ref 36.0–46.0)
Hemoglobin: 8.9 g/dL — ABNORMAL LOW (ref 12.0–15.0)
MCH: 32.7 pg (ref 26.0–34.0)
MCHC: 31.9 g/dL (ref 30.0–36.0)
MCV: 102.6 fL — ABNORMAL HIGH (ref 80.0–100.0)
Platelets: 386 10*3/uL (ref 150–400)
RBC: 2.72 MIL/uL — ABNORMAL LOW (ref 3.87–5.11)
RDW: 12.6 % (ref 11.5–15.5)
WBC: 9.8 10*3/uL (ref 4.0–10.5)
nRBC: 0 % (ref 0.0–0.2)

## 2022-04-24 LAB — PHOSPHORUS: Phosphorus: 2.2 mg/dL — ABNORMAL LOW (ref 2.5–4.6)

## 2022-04-24 LAB — GLUCOSE, CAPILLARY
Glucose-Capillary: 129 mg/dL — ABNORMAL HIGH (ref 70–99)
Glucose-Capillary: 170 mg/dL — ABNORMAL HIGH (ref 70–99)
Glucose-Capillary: 173 mg/dL — ABNORMAL HIGH (ref 70–99)
Glucose-Capillary: 196 mg/dL — ABNORMAL HIGH (ref 70–99)
Glucose-Capillary: 237 mg/dL — ABNORMAL HIGH (ref 70–99)
Glucose-Capillary: 260 mg/dL — ABNORMAL HIGH (ref 70–99)

## 2022-04-24 LAB — MAGNESIUM: Magnesium: 1.8 mg/dL (ref 1.7–2.4)

## 2022-04-24 MED ORDER — STERILE WATER FOR INJECTION IV SOLN
INTRAVENOUS | Status: DC
  Filled 2022-04-24: qty 150
  Filled 2022-04-24: qty 1000
  Filled 2022-04-24: qty 150

## 2022-04-24 MED ORDER — OSMOLITE 1.5 CAL PO LIQD
1000.0000 mL | ORAL | Status: DC
  Administered 2022-04-24 – 2022-04-26 (×3): 1000 mL

## 2022-04-24 MED ORDER — ORAL CARE MOUTH RINSE
15.0000 mL | OROMUCOSAL | Status: DC | PRN
  Administered 2022-04-25 (×5): 15 mL via OROMUCOSAL

## 2022-04-24 MED ORDER — POTASSIUM CHLORIDE 20 MEQ PO PACK
40.0000 meq | PACK | Freq: Once | ORAL | Status: AC
  Administered 2022-04-24: 40 meq
  Filled 2022-04-24: qty 2

## 2022-04-24 MED ORDER — LACTATED RINGERS IV BOLUS
500.0000 mL | Freq: Once | INTRAVENOUS | Status: AC
  Administered 2022-04-24: 500 mL via INTRAVENOUS

## 2022-04-24 MED ORDER — FREE WATER
100.0000 mL | Status: DC
  Administered 2022-04-24 – 2022-04-28 (×21): 100 mL

## 2022-04-24 MED ORDER — POTASSIUM & SODIUM PHOSPHATES 280-160-250 MG PO PACK
2.0000 | PACK | ORAL | Status: AC
  Administered 2022-04-24 (×3): 2
  Filled 2022-04-24 (×3): qty 2

## 2022-04-24 NOTE — Progress Notes (Signed)
NAME:  Laurie Lin, MRN:  [id number], DOB:  December 11, 1955, LOS: 6 ADMISSION DATE:  04/17/2022, CONSULTATION DATE:  04/20/22 REFERRING MD:  Dr. Maryland Pink, CHIEF COMPLAINT:  Delirium Tremens   History of Present Illness:  This is a 67 yo F with hx of alcoholism, she drinks several beers and liquor drinks daily chronically. She also has background of HTN, DM1, CKD3, who came in with severe hyperglycemia, AMS encephalopathy, noted to have hematemesis at home and has dry blood in mouth during my examination. She has been on the medical floor with treatment for past 3 days. She has been receiving therapy for DM, UTI due to klebsiella, Wernickes encephalopathy, AKI, and bronchitis. While on floor she continued to deteriorate and has become unresponsive with jerky movements. PCCM consultation for concern patient developing Delerium Tremens.   Please see "significant hospital events" section below for full detailed hospital course.  Pertinent  Medical History   Past Medical History:  Diagnosis Date   Diabetes mellitus without complication ([place])    GERD (gastroesophageal reflux disease)    Hypertension    Renal disorder    stage 3     Micro Data:  12/29: Urine>>KLEBSIELLA PNEUMONIAE  12/31: MRSA PCR>>negative 12/31: Blood culture x2>> NGTD 1/1: COVID/RSV/Influenza PCR>>negative 1/3: Repeat blood cultures>> NGTD 1/3: Tracheal aspirate>>  Antimicrobials:   Anti-infectives (From admission, onward)    Start     Dose/Rate Route Frequency Ordered Stop   04/19/22 1815  cefTRIAXone (ROCEPHIN) 1 g in sodium chloride 0.9 % 100 mL IVPB        1 g 200 mL/hr over 30 Minutes Intravenous Every 24 hours 04/19/22 1715 04/23/22 1900   04/19/22 1815  azithromycin (ZITHROMAX) 500 mg in sodium chloride 0.9 % 250 mL IVPB  Status:  Discontinued        500 mg 250 mL/hr over 60 Minutes Intravenous Every 24 hours 04/19/22 1715 04/23/22 1434   04/18/22 1500  doxycycline (VIBRAMYCIN) 100 mg in sodium chloride 0.9  % 250 mL IVPB  Status:  Discontinued        100 mg 125 mL/hr over 120 Minutes Intravenous Every 12 hours 04/18/22 1344 04/19/22 [facility] Events: Including procedures, antibiotic start and stop dates in addition to other pertinent events   04/21/22- patient is still poorly responsive with signs of EtOH withdrawal. MRI brain with alcoholic encephalopathy.  I met with family today and reviewed care plan. Intubated due to worsening obtundation 04/22/22- SAT showed continued altered mental status, vent dyssynchrony, and high peak pressures.   04/23/22-Precedex weaned off, patient became agitated and unable to tolerate SAT. Sedation resumed. Hypothermic this morning to 34.5 degrees celsius. Bear hugger in place.  04/24/22: EXTUBATED to room air.  AKI slightly worsened, Bicarb gtt initiated.  Interim History / Subjective:  -No significant events noted overnight ~ was placed on Bicarb gtt due to worsening of AKI and mild metabolic acidosis -Afebrile, hemodynamically stable, no vasopressors -On minimal vent support this morning ~ plan for SBT ~ EXTUBATED to room air -Hgb decreased to 8.9 from 10.4, no bleeding noted, likely dilutional from IV fluids last night   Objective   Blood pressure (!) 106/54, pulse 73, temperature 97.7 F (36.5 C), temperature source Oral, resp. rate (!) 0, height _0  (1.778 m), weight 88.9 kg, last menstrual period 09/09/2004, SpO2 100 %.    Vent Mode: PSV;CPAP FiO2 (%):  [30 %-35 %] 30 % Set Rate:  [15 bmp] 15  bmp Vt Set:  [500 mL] 500 mL PEEP:  [5 cmH20] 5 cmH20 Pressure Support:  [8 Porter Pressure:  [9 cmH20-11 cmH20] 11 cmH20   Intake/Output Summary (Last 24 hours) at 04/24/2022 1050 Last data filed at 04/24/2022 0600 Gross per 24 hour  Intake 2499.97 ml  Output 83 ml  Net 2416.97 ml   Filed Weights   04/17/22 1436 04/23/22 1144  Weight: 93 kg 88.9 kg    Examination: General: Acutely ill-appearing female, laying in  bed, intubated sedated, no acute distress HENT: Atraumatic, normocephalic, neck supple, no JVD, orally intubated Lungs: Clear breath sounds bilaterally, even, synchronous with the vent, nonlabored Cardiovascular: Regular rate and rhythm, S1-S2, no murmurs, rubs, gallops Abdomen: Soft, nontender, nondistended, no guarding or rebound tenderness, bowel sounds positive x 4 Extremities: Normal bulk and tone, no deformities, no edema, no cyanosis Neuro: Sedated, currently does not follow commands, pupils PERRLA GU: External female catheter in place  Resolved Hospital Problem list     Assessment & Plan:   Acute hypoxemic respiratory failure due to Acute Encephalopathy w/possible aspiration  -Full vent support, implement lung protective strategies -Plateau pressures less than 30 cm H20 -Wean FiO2 & PEEP as tolerated to maintain O2 sats >92% -Follow intermittent Chest X-ray & ABG as needed -Spontaneous Breathing Trials when respiratory parameters met and mental status permits -Implement VAP Bundle -Prn Bronchodilators -ABX as above  Klebsiella UTI ~ TREATED -Monitor fever curve -Trend WBC's & Procalcitonin -Follow cultures as above -Completed course of Ceftriaxone   Acute on Chronic Kidney disease Stage III Baseline creatinine appears to be around 1.7 -Monitor I&O's / urinary output -Follow BMP -Ensure adequate renal perfusion -Avoid nephrotoxic agents as able -Replace electrolytes as indicated -Bicarb gtt  Type I Diabetes w/Hyperglycemia -HgbA1c 7.6% -CBG's q4h; Target range of 140 to 180 -SSI -Follow ICU Hypo/Hyperglycemia protocol  Acute Metabolic Encephalopathy Concern for DT's PMHx: ETOH abuse --MRI 04/21/22: Significant for diffuse volume loss for age with periventricular white matter hyperintensity. Nonspecific but compatible with Chronic Alcoholic Encephalopathy  --[facility] 04/22/22: No acute abnormality. Sequela of moderate chronic microvascular ischemic change with advanced  generalized volume loss -Treatment of metabolic derangements and UTI and outlined above -Maintain a RASS goal of 0 to -1 -Precedex as needed to maintain RASS goal -Avoid sedating medications as able -Daily wake up assessment -Thiamine, Folate, MVI    Best Practice (right click and "Reselect all SmartList Selections" daily)   Diet/type: NPO DVT prophylaxis: LMWH GI prophylaxis: PPI Lines: N/A Foley:  N/A Code Status:  full code Last date of multidisciplinary goals of care discussion [04/24/22]  1/4: Pt's family updated at bedside.  Labs   CBC: Recent Labs  Lab 04/20/22 0539 04/21/22 0145 04/22/22 0415 04/23/22 0406 04/24/22 0529  WBC 11.5* 14.1* 11.5* 10.5 9.8  HGB 10.6* 11.1* 9.4* 10.4* 8.9*  HCT 32.5* 34.2* 28.8* 31.3* 27.9*  MCV 102.5* 102.7* 100.7* 100.6* 102.6*  PLT 356 341 322 341 409    Basic Metabolic Panel: Recent Labs  Lab 04/19/22 0435 04/20/22 0539 04/21/22 0145 04/21/22 1317 04/22/22 0415 04/23/22 0406 04/23/22 1435 04/24/22 0529  NA 143   < > 143  --  141 134* 137 138  K 4.0   < > 3.8  --  3.3* 2.8* 3.6 3.4*  CL 115*   < > 113*  --  110 108 113* 114*  CO2 21*   < > 22  --  19* 16* 18* 18*  GLUCOSE 146*   < >  112*  --  138* 165* 183* 163*  BUN 30*   < > 23  --  28* 25* 30* 33*  CREATININE 2.03*   < > 1.56*  --  1.63* 1.17* 1.35* 1.60*  CALCIUM 8.9   < > 8.2*  --  7.9* 8.0* 8.0* 8.0*  MG 1.4*  --  1.2* 2.3 2.1 2.0  --  1.8  PHOS 2.6  --  3.4  --  2.7 2.3*  --  2.2*   < > = values in this interval not displayed.   GFR: Estimated Creatinine Clearance: 41.3 mL/min (A) (by C-G formula based on SCr of 1.6 mg/dL (H)). Recent Labs  Lab 04/18/22 0931 04/19/22 0435 04/20/22 0539 04/21/22 0145 04/22/22 0415 04/23/22 0406 04/24/22 0529  PROCALCITON  --  0.12  --   --   --   --   --   WBC  --  12.8*   < > 14.1* 11.5* 10.5 9.8  LATICACIDVEN 1.2  --   --   --   --   --   --    < > = values in this interval not displayed.    Liver Function  Tests: Recent Labs  Lab 04/17/22 1445 04/19/22 0435 04/24/22 0529  AST _0 ALT _1 ALKPHOS 52 40 87  BILITOT 1.8* 0.6 0.6  PROT 8.4* 7.4 5.9*  ALBUMIN 3.8 3.3* 2.1*   No results for input(s): "LIPASE", "AMYLASE" in the last 168 hours. Recent Labs  Lab 04/18/22 0044 04/22/22 0848  AMMONIA <10 38*    ABG    Component Value Date/Time   PHART 7.43 04/21/2022 1754   PCO2ART 33 04/21/2022 1754   PO2ART 218 (H) 04/21/2022 1754   HCO3 21.9 04/21/2022 1754   ACIDBASEDEF 1.7 04/21/2022 1754   O2SAT 99.5 04/21/2022 1754     Coagulation Profile: Recent Labs  Lab 04/18/22 0044  INR 1.0    Cardiac Enzymes: No results for input(s): "CKTOTAL", "CKMB", "CKMBINDEX", "TROPONINI" in the last 168 hours.  HbA1C: Hemoglobin A1C  Date/Time Value Ref Range Status  11/03/2019 12:00 AM 6.8  Final  11/09/2018 12:00 AM 6.5  Final   Hgb A1c MFr Bld  Date/Time Value Ref Range Status  04/18/2022 07:01 AM 7.2 (H) 4.8 - 5.6 % Final    Comment:    (NOTE)         Prediabetes: 5.7 - 6.4         Diabetes: >6.4         Glycemic control for adults with diabetes: <7.0     CBG: Recent Labs  Lab 04/23/22 1625 04/23/22 1935 04/23/22 2307 04/24/22 0312 04/24/22 0746  GLUCAP 141* 145* 135* 129* 170*    Review of Systems:   Unable to assess due to intubation/sedation/critical illness   Past Medical History:  She,  has a past medical history of Diabetes mellitus without complication ([place]), GERD (gastroesophageal reflux disease), Hypertension, and Renal disorder.   Surgical History:   Past Surgical History:  Procedure Laterality Date   DILATION AND CURETTAGE OF UTERUS     Miscarriage   EYE SURGERY     bilateral caterac   FINGER SURGERY     KNEE ARTHROSCOPY Left 1989   [facility]   PILONIDAL CYST DRAINAGE       Social History:   reports that she has never smoked. She has never used smokeless tobacco. She reports current alcohol use of about 6.0 standard drinks of alcohol  per week. She reports that she does not use drugs.   Family History:  Her family history includes Diabetes in her mother.   Allergies Allergies  Allergen Reactions   Ibuprofen Nausea Only   Nsaids     Ulcerative stomach and small intestines   Brimonidine Itching    Red, itchy, sensitivity to light Red, itchy, sensitivity to light    Latex Itching    Other reaction(s): Other (see comments)     Home Medications  Prior to Admission medications   Medication Sig Start Date End Date Taking? Authorizing Provider  acetaminophen (TYLENOL) 500 MG tablet Take 500 mg by mouth 2 (two) times daily as needed.    Yes [provider]  esomeprazole (NEXIUM) 40 MG capsule TAKE 1 CAPSULE (40 MG TOTAL) BY MOUTH 2 (TWO) TIMES DAILY BEFORE A MEAL. 08/13/19  Yes Malfi, Lupita Raider, FNP  insulin aspart (NOVOLOG) 100 UNIT/ML injection Inject 0-9 Units into the skin 3 (three) times daily with meals. BS 70-120 - 0 units       121-150 - 1 units       151-200 - 2 units        201-250 - 3 units        251-300 - 4 units        301-350 - 5 units        351-400 - 6 units        > 400 call md. 03/27/17  Yes Sainani, Belia Heman, MD  Insulin Glargine (BASAGLAR KWIKPEN) 100 UNIT/ML SOPN Inject 12 Units into the skin at bedtime.  07/01/18  Yes [provider]  lisinopril (ZESTRIL) 2.5 MG tablet Take 1 tablet (2.5 mg total) by mouth daily. 12/02/18  Yes Mikey College, NP  Multiple Vitamin (MULTIVITAMIN WITH MINERALS) TABS tablet Take 1 tablet by mouth daily. 03/28/17  Yes Sainani, Belia Heman, MD  Netarsudil Dimesylate (RHOPRESSA) 0.02 % SOLN Place 1 drop into both eyes every evening.   Yes [provider]  tiZANidine (ZANAFLEX) 4 MG capsule Take 1 capsule (4 mg total) by mouth 3 (three) times daily as needed for muscle spasms. 10/28/19  Yes Amyot, Nicholes Stairs, NP  torsemide (DEMADEX) 20 MG tablet Take 20 mg by mouth daily.   Yes [provider]  Travoprost, BAK Free, (TRAVATAN) 0.004 % SOLN  ophthalmic solution Place 1 drop into both eyes at bedtime.   Yes [provider]  BD ULTRA-FINE PEN NEEDLES 29G X 12.7MM MISC 1 EACH BY MISCELLANEOUS ROUTE FIVE (5) TIMES A DAY. 03/09/18   [provider]  Continuous Blood Gluc Sensor (DEXCOM G6 SENSOR) MISC by Does not apply route. 11/10/18   [provider]  dorzolamide (TRUSOPT) 2 % ophthalmic solution TAKE 1 DROP(S) IN RIGHT EYE 3 TIMES A DAY Patient not taking: Reported on 04/18/2022 10/05/18   [provider]  estradiol (ESTRACE) 0.1 MG/GM vaginal cream Estrogen Cream Instruction Discard applicator Apply pea sized amount to tip of finger to urethra before bed. Wash hands well after application. Use Monday, Wednesday and Friday Patient not taking: Reported on 04/18/2022 08/22/20   Billey Co, MD  furosemide (LASIX) 20 MG tablet Take 1 tablet (20 mg total) by mouth daily as needed for edema. Patient not taking: Reported on 04/18/2022 12/02/18   Mikey College, NP  HYDROcodone-acetaminophen (NORCO/VICODIN) 5-325 MG tablet Take 1 tablet by mouth every 6 (six) hours as needed for severe pain. Patient not taking: Reported on 04/18/2022 10/28/19   Amyot,  Nicholes Stairs, NP  Insulin Pen Needle 32G X 4 MM MISC ok to sub any brand or size needle preferred by insurance/patient, use up to 5x/day, dx E10.65 03/02/18   [provider]  [facility] DELICA LANCETS 97O MISC 1 Device by Does not apply route 4 (four) times daily. 05/20/17   Mikey College, NP  sulfamethoxazole-trimethoprim (BACTRIM DS) 800-160 MG tablet Take 1 tablet by mouth every 12 (twelve) hours. Patient not taking: Reported on 04/18/2022 08/29/20   Billey Co, MD  sertraline (ZOLOFT) 50 MG tablet TAKE 1 TABLET BY MOUTH EVERY DAY 12/13/18 10/28/19  Mikey College, NP     Critical care time: 40 minutes     Darel Hong, AGACNP-BC [facility] Prefer epic messenger for cross cover needs If after hours,  please call E-link

## 2022-04-24 NOTE — Progress Notes (Signed)
Pt extubated without complications, no stridor note, sats 100% on roomair, respiratory rate 18/min, no shortness of breath noted, will continue to monitor.

## 2022-04-24 NOTE — Progress Notes (Signed)
Nutrition Follow Up Note   DOCUMENTATION CODES:   Not applicable  INTERVENTION:   Change to Osmolite 1.5_0 /hr + ProSource TF 20- Give 60m daily via tube  Free water flushes 1063mq4 hours   Regimen provides 2240kcal/day, 110g/day protein and 169729may of free water.   Folic acid 1mg35m daily   NUTRITION DIAGNOSIS:   Inadequate oral intake related to inability to eat (pt sedated and ventilated) as evidenced by NPO status.  GOAL:   Patient will meet greater than or equal to 90% of their needs -met with tube feeds   MONITOR:   Diet advancement, Labs, Weight trends, TF tolerance, Skin, I & O's  ASSESSMENT:   66 y59 female with h/o IDDM, HTN, etoh abuse, GERD, hital hernia, neuropathy, cirrhosis, varices and CKD III who is admitted with encephalopathy, UTI and possible Wernickes.  Pt extubated today. NGT remains in place. Pt is tolerating tube feeds well at goal rate. Pt is refeeding; electrolytes being monitored and supplemented. Per chart, pt is currently at her UBW. Pt +15.0L on her I & Os. Pt did have BM today.   Medications reviewed and include: colace, lovenox, folic acid, insulin, reglan, protonix, miralax, senokot, thiamine. Na bicarbonate  Labs reviewed: K 3.8 wnl, BUN 35(H), creat 1.55(H), P 2.2(L), Mg 1.8 wnl Ammonia- 38(H) Folate- 3.4(L)- 12/31 Wbc- Hgb 8.9(L), Hct 27.9(L) Cbgs- 196, 170, 129 x 24 hrs   Diet Order:   Diet Order             Diet NPO time specified  Diet effective now                  EDUCATION NEEDS:   No education needs have been identified at this time  Skin:  Skin Assessment: Reviewed RN Assessment (skin tear hand)  Last BM:  1/4- TYPE 7  Height:   Ht Readings from Last 1 Encounters:  04/20/22 _1  (1.778 m)    Weight:   Wt Readings from Last 1 Encounters:  04/23/22 88.9 kg    Ideal Body Weight:  75.45 kg  BMI:  Body mass index is 28.12 kg/m.  Estimated Nutritional Needs:   Kcal:   1900-2200kcal/day  Protein:  95-110g/day  Fluid:  1.7-2.0L/day  CaseKoleen Distance RD, LDN Please refer to [facility] and/or RD on-call/weekend/after hours pager

## 2022-04-24 NOTE — Consult Note (Signed)
PHARMACY CONSULT NOTE - ELECTROLYTES  Pharmacy Consult for Electrolyte Monitoring and Replacement   Recent Labs: Potassium (mmol/L)  Date Value  04/24/2022 3.4 (L)   Magnesium (mg/dL)  Date Value  04/24/2022 1.8   Calcium (mg/dL)  Date Value  04/24/2022 8.0 (L)   Albumin (g/dL)  Date Value  04/24/2022 2.1 (L)   Phosphorus (mg/dL)  Date Value  04/24/2022 2.2 (L)   Sodium  Date Value  04/24/2022 138 mmol/L  03/29/2019 142   Corrected Ca: 9.9 mg/dL  Assessment  Laurie Lin is a 67 y.o. female presenting with encephalopathy. PMH significant for T1DM, HTN, AUD, GERD, hiatal hernia, neuropathy, cirrhosis, varices, CKD3. Pharmacy has been consulted to monitor and replace electrolytes.  Diet: TF @ 60 MIVF: Bicarb @ 75 mL/hr  Goal of Therapy: Electrolytes WNL  Plan:  Potassium: 3.4 >> 3.8, no replacement needed Magnesium: 2.0 >> 1.8, no replacement needed Phosphorus: 2.3 >> 2.2, give PhosNAK 2 packets Q4H x3 Check BMP, Mg, Phos with AM labs  Thank you for allowing pharmacy to be a part of this patient's care.  Gretel Acre, PharmD PGY1 Pharmacy Resident 04/24/2022 10:45 AM

## 2022-04-24 NOTE — Plan of Care (Signed)
Pt progressing toward goal, extubated today to room air. She have done well until this evening her BBS with rales all four lobes and pt having increase in wet cough. MD was notified and stated is okay to hold tube feeding tonight. MD/NP will reassess tube feeding in AM.

## 2022-04-25 ENCOUNTER — Inpatient Hospital Stay: Payer: Medicare Other

## 2022-04-25 DIAGNOSIS — J690 Pneumonitis due to inhalation of food and vomit: Secondary | ICD-10-CM | POA: Diagnosis not present

## 2022-04-25 DIAGNOSIS — G9340 Encephalopathy, unspecified: Secondary | ICD-10-CM | POA: Diagnosis not present

## 2022-04-25 DIAGNOSIS — N1831 Chronic kidney disease, stage 3a: Secondary | ICD-10-CM

## 2022-04-25 DIAGNOSIS — F1010 Alcohol abuse, uncomplicated: Secondary | ICD-10-CM | POA: Diagnosis not present

## 2022-04-25 DIAGNOSIS — N179 Acute kidney failure, unspecified: Secondary | ICD-10-CM | POA: Diagnosis not present

## 2022-04-25 LAB — MAGNESIUM: Magnesium: 1.6 mg/dL — ABNORMAL LOW (ref 1.7–2.4)

## 2022-04-25 LAB — BASIC METABOLIC PANEL
Anion gap: 7 (ref 5–15)
Anion gap: 6 (ref 5–15)
BUN: 33 mg/dL — ABNORMAL HIGH (ref 8–23)
BUN: 31 mg/dL — ABNORMAL HIGH (ref 8–23)
CO2: 23 mmol/L (ref 22–32)
CO2: 23 mmol/L (ref 22–32)
Calcium: 8.2 mg/dL — ABNORMAL LOW (ref 8.9–10.3)
Calcium: 8.2 mg/dL — ABNORMAL LOW (ref 8.9–10.3)
Chloride: 112 mmol/L — ABNORMAL HIGH (ref 98–111)
Chloride: 111 mmol/L (ref 98–111)
Creatinine, Ser: 1.45 mg/dL — ABNORMAL HIGH (ref 0.44–1.00)
Creatinine, Ser: 1.35 mg/dL — ABNORMAL HIGH (ref 0.44–1.00)
GFR, Estimated: 40 mL/min — ABNORMAL LOW (ref >60)
GFR, Estimated: 43 mL/min — ABNORMAL LOW (ref >60)
Glucose, Bld: 127 mg/dL — ABNORMAL HIGH (ref 70–99)
Glucose, Bld: 127 mg/dL — ABNORMAL HIGH (ref 70–99)
Potassium: 3.8 mmol/L (ref 3.5–5.1)
Potassium: 3.7 mmol/L (ref 3.5–5.1)
Sodium: 142 mmol/L (ref 135–145)
Sodium: 140 mmol/L (ref 135–145)

## 2022-04-25 LAB — CBC
HCT: 28.3 % — ABNORMAL LOW (ref 36.0–46.0)
Hemoglobin: 9.4 g/dL — ABNORMAL LOW (ref 12.0–15.0)
MCH: 32.9 pg (ref 26.0–34.0)
MCHC: 33.2 g/dL (ref 30.0–36.0)
MCV: 99 fL (ref 80.0–100.0)
Platelets: 452 10*3/uL — ABNORMAL HIGH (ref 150–400)
RBC: 2.86 MIL/uL — ABNORMAL LOW (ref 3.87–5.11)
RDW: 12.6 % (ref 11.5–15.5)
WBC: 13.8 10*3/uL — ABNORMAL HIGH (ref 4.0–10.5)
nRBC: 0 % (ref 0.0–0.2)

## 2022-04-25 LAB — URINALYSIS, ROUTINE W REFLEX MICROSCOPIC
Bilirubin Urine: NEGATIVE
Glucose, UA: NEGATIVE mg/dL
Hgb urine dipstick: NEGATIVE
Ketones, ur: NEGATIVE mg/dL
Nitrite: NEGATIVE
Protein, ur: NEGATIVE mg/dL
Specific Gravity, Urine: 1.012 (ref 1.005–1.030)
pH: 5 (ref 5.0–8.0)

## 2022-04-25 LAB — GLUCOSE, CAPILLARY
Glucose-Capillary: 124 mg/dL — ABNORMAL HIGH (ref 70–99)
Glucose-Capillary: 177 mg/dL — ABNORMAL HIGH (ref 70–99)
Glucose-Capillary: 195 mg/dL — ABNORMAL HIGH (ref 70–99)
Glucose-Capillary: 286 mg/dL — ABNORMAL HIGH (ref 70–99)
Glucose-Capillary: 86 mg/dL (ref 70–99)
Glucose-Capillary: 97 mg/dL (ref 70–99)

## 2022-04-25 LAB — CULTURE, BLOOD (ROUTINE X 2)
Culture: NO GROWTH
Culture: NO GROWTH
Special Requests: ADEQUATE
Special Requests: ADEQUATE

## 2022-04-25 LAB — BRAIN NATRIURETIC PEPTIDE: B Natriuretic Peptide: 487.9 pg/mL — ABNORMAL HIGH (ref 0.0–100.0)

## 2022-04-25 LAB — PHOSPHORUS: Phosphorus: 3.6 mg/dL (ref 2.5–4.6)

## 2022-04-25 LAB — PROCALCITONIN: Procalcitonin: 0.67 ng/mL

## 2022-04-25 MED ORDER — INSULIN ASPART 100 UNIT/ML IJ SOLN
0.0000 [IU] | INTRAMUSCULAR | Status: DC
  Administered 2022-04-25: 3 [IU] via SUBCUTANEOUS
  Administered 2022-04-25: 4 [IU] via SUBCUTANEOUS
  Administered 2022-04-25: 11 [IU] via SUBCUTANEOUS
  Administered 2022-04-25: 4 [IU] via SUBCUTANEOUS
  Administered 2022-04-26 (×2): 11 [IU] via SUBCUTANEOUS
  Administered 2022-04-26: 7 [IU] via SUBCUTANEOUS
  Administered 2022-04-26: 11 [IU] via SUBCUTANEOUS
  Administered 2022-04-26: 7 [IU] via SUBCUTANEOUS
  Administered 2022-04-27: 11 [IU] via SUBCUTANEOUS
  Administered 2022-04-27: 7 [IU] via SUBCUTANEOUS
  Administered 2022-04-27: 4 [IU] via SUBCUTANEOUS
  Administered 2022-04-27: 3 [IU] via SUBCUTANEOUS
  Administered 2022-04-27: 11 [IU] via SUBCUTANEOUS
  Administered 2022-04-27: 7 [IU] via SUBCUTANEOUS
  Administered 2022-04-28: 15 [IU] via SUBCUTANEOUS
  Administered 2022-04-28: 11 [IU] via SUBCUTANEOUS
  Administered 2022-04-28 (×2): 7 [IU] via SUBCUTANEOUS
  Administered 2022-04-28 – 2022-04-29 (×2): 3 [IU] via SUBCUTANEOUS
  Administered 2022-04-29: 4 [IU] via SUBCUTANEOUS
  Administered 2022-04-29 (×4): 3 [IU] via SUBCUTANEOUS
  Administered 2022-04-30 (×5): 4 [IU] via SUBCUTANEOUS
  Administered 2022-05-01: 7 [IU] via SUBCUTANEOUS
  Administered 2022-05-01: 4 [IU] via SUBCUTANEOUS
  Administered 2022-05-01: 11 [IU] via SUBCUTANEOUS
  Administered 2022-05-01 (×2): 7 [IU] via SUBCUTANEOUS
  Administered 2022-05-02 (×2): 4 [IU] via SUBCUTANEOUS
  Administered 2022-05-02: 11 [IU] via SUBCUTANEOUS
  Administered 2022-05-02 (×2): 4 [IU] via SUBCUTANEOUS
  Administered 2022-05-02 – 2022-05-03 (×2): 7 [IU] via SUBCUTANEOUS
  Administered 2022-05-03 (×2): 4 [IU] via SUBCUTANEOUS
  Administered 2022-05-03: 3 [IU] via SUBCUTANEOUS
  Administered 2022-05-03: 4 [IU] via SUBCUTANEOUS
  Administered 2022-05-03: 7 [IU] via SUBCUTANEOUS
  Administered 2022-05-04: 4 [IU] via SUBCUTANEOUS
  Administered 2022-05-04: 7 [IU] via SUBCUTANEOUS
  Administered 2022-05-04: 15 [IU] via SUBCUTANEOUS
  Administered 2022-05-04 – 2022-05-05 (×4): 4 [IU] via SUBCUTANEOUS
  Administered 2022-05-05: 11 [IU] via SUBCUTANEOUS
  Administered 2022-05-05 – 2022-05-06 (×4): 7 [IU] via SUBCUTANEOUS
  Administered 2022-05-06: 4 [IU] via SUBCUTANEOUS
  Administered 2022-05-06: 3 [IU] via SUBCUTANEOUS
  Filled 2022-04-25 (×60): qty 1

## 2022-04-25 MED ORDER — MAGNESIUM SULFATE 4 GM/100ML IV SOLN
4.0000 g | Freq: Once | INTRAVENOUS | Status: AC
  Administered 2022-04-25: 4 g via INTRAVENOUS
  Filled 2022-04-25: qty 100

## 2022-04-25 MED ORDER — FUROSEMIDE 10 MG/ML IJ SOLN
20.0000 mg | Freq: Once | INTRAMUSCULAR | Status: AC
  Administered 2022-04-25: 20 mg via INTRAVENOUS
  Filled 2022-04-25: qty 2

## 2022-04-25 MED ORDER — SODIUM CHLORIDE 0.9 % IV SOLN
2.0000 g | Freq: Two times a day (BID) | INTRAVENOUS | Status: DC
  Administered 2022-04-25 – 2022-04-27 (×4): 2 g via INTRAVENOUS
  Filled 2022-04-25: qty 2
  Filled 2022-04-25: qty 12.5
  Filled 2022-04-25 (×2): qty 2
  Filled 2022-04-25: qty 12.5

## 2022-04-25 NOTE — Inpatient Diabetes Management (Signed)
Inpatient Diabetes Program Recommendations  AACE/ADA: New Consensus Statement on Inpatient Glycemic Control (2015)  Target Ranges:  Prepandial:   less than 140 mg/dL      Peak postprandial:   less than 180 mg/dL (1-2 hours)      Critically ill patients:  140 - 180 mg/dL    Latest Reference Range & Units 04/24/22 07:46 04/24/22 11:08 04/24/22 16:11 04/24/22 20:21 04/24/22 23:31 04/25/22 03:35 04/25/22 07:49  Glucose-Capillary 70 - 99 mg/dL 170 (H) 196 (H) 237 (H) 260 (H) 173 (H) 97 124 (H)   Review of Glycemic Control  Diabetes history: DM1 (does not make any insulin; requires basal, correction, and carbohydrate coverage insulin) Outpatient Diabetes medications: Basaglar 12 units QHS, Novolog 0-9 units TID with meals  Current orders for Inpatient glycemic control: Semglee 12 units daily, Novolog 0-20 units Q4H   Inpatient Diabetes Program Recommendations:     Insulin: Please consider decreasing Novolog to 0-9 units Q4H. If diet is started, patient will need Novolog meal coverage insulin ordered. If tube feedings are resumed, would need Novolog tube feeding coverage.   NOTE: Per chart, patient extubated on 04/24/22 and tube feedings on hold and patient is NPO. Novolog correction scale was increased during the night and Novolog resistant scale likely to aggressive for patient with Type 1 DM.  Thanks, Barnie Alderman, RN, MSN, [place] [facility] Coordinator Inpatient Diabetes Program [phone] (Team Pager from 8am to [place])

## 2022-04-25 NOTE — Progress Notes (Signed)
Patient struggling to clear secretions and RN performed frequent suctioning for patient comfort and safety throughout the day. MD made aware by RN and acknowledged patient would benefit from duoneb treatments and/or chest PT. No new orders at this time. Patient grimacing and reaching for left side of face frequently this since 1745. RN provided PRN tylenol per tube. Family concerned about possible abscess. This information relayed to MD via secure chat.

## 2022-04-25 NOTE — Assessment & Plan Note (Signed)
Secondary to agitation.  Required mechanical intubation.

## 2022-04-25 NOTE — Progress Notes (Signed)
Osmolite restarted after MD Maryland Pink verbal approval. Sodium bicarb restarted. Family at bedside throughout the day offering support and care to patient.

## 2022-04-25 NOTE — Plan of Care (Signed)
  Problem: Education: Goal: Ability to describe self-care measures that may prevent or decrease complications (Diabetes Survival Skills Education) will improve Outcome: Progressing   Problem: Coping: Goal: Ability to adjust to condition or change in health will improve Outcome: Progressing   Problem: Health Behavior/Discharge Planning: Goal: Ability to identify and utilize available resources and services will improve Outcome: Progressing   Problem: Education: Goal: Knowledge of General Education information will improve Description: Including pain rating scale, medication(s)/side effects and non-pharmacologic comfort measures Outcome: Progressing

## 2022-04-25 NOTE — Progress Notes (Signed)
1910 patient alert to self follows some commands patient unable to make needs known 2000 medications given as ordered TF increased to goal 63ml hour 2100 complete ADL care ChG completed FMS flushed as ordered deflated and re-inflated

## 2022-04-25 NOTE — Evaluation (Signed)
Clinical/Bedside Swallow Evaluation Patient Details  Name: Laurie Lin MRN: [id number] Date of Birth: 08-Apr-1956  Today's Date: 04/25/2022 Time: SLP Start Time (ACUTE ONLY): 1425 SLP Stop Time (ACUTE ONLY): 1450 SLP Time Calculation (min) (ACUTE ONLY): 25 min  Past Medical History:  Past Medical History:  Diagnosis Date   Diabetes mellitus without complication (HCC)    GERD (gastroesophageal reflux disease)    Hypertension    Renal disorder    stage 3    Past Surgical History:  Past Surgical History:  Procedure Laterality Date   DILATION AND CURETTAGE OF UTERUS     Miscarriage   EYE SURGERY     bilateral caterac   FINGER SURGERY     KNEE ARTHROSCOPY Left 1989   [facility]   PILONIDAL CYST DRAINAGE     HPI:  67 year old female with past medical history of diabetes mellitus type 1, stage IIIa chronic kidney disease, hypertension and ongoing alcohol use who was brought in for worsening confusion for the last month.  Workup revealed urinary tract infection, acute kidney injury, hypomagnesemia, hyperkalemia.and Wernicke's encephalopathy.  While on floor she continued to deteriorate and has become unresponsive with jerky movements. PCCM consultation for concern patient developing Delerium Tremens. Pt intubated 01/01 thru 04/24/2022.    Assessment / Plan / Recommendation  Clinical Impression  Pt presents with bilateral mitts in place, eyes open with occasional eye contact with this writer throughout this evaluation. Pt was not able to follow directions, didn't attempts to verbalize. As such, pt didn't have a response to ice chips at her mouth until the 4 attempt. Pt opened her mouth to receive ice chips. Prior to having ice chips in her mouth, pt's RR was 16. However prior to swallowing the ice chips, her RR rose to 28 and returned to 15 after the swallow. This was a consistent response across 5 trials. Concern that pt might be inattentive to liquid allowing liquid to prematurely spill into  airway prior to initiating a swallow response. At this time, hesitant to attempt more advanced trials as pt is not able to follow commands for oral inspection. Recommend pt have some ice chips with nursing after oral care. ST to follow for further PO readiness in next couple of days. SLP Visit Diagnosis: Dysphagia, oropharyngeal phase (R13.12)    Aspiration Risk  Severe aspiration risk;Risk for inadequate nutrition/hydration    Diet Recommendation Ice chips PRN after oral care   Medication Administration: Via alternative means Supervision: Staff to assist with self feeding;Full supervision/cueing for compensatory strategies Compensations: Minimize environmental distractions;Slow rate;Small sips/bites Postural Changes: Seated upright at 90 degrees    Other  Recommendations Oral Care Recommendations: Oral care prior to ice chip/H20;Oral care QID    Recommendations for follow up therapy are one component of a multi-disciplinary discharge planning process, led by the attending physician.  Recommendations may be updated based on patient status, additional functional criteria and insurance authorization.  Follow up Recommendations Follow physician's recommendations for discharge plan and follow up therapies      Assistance Recommended at Discharge  Supervision  Functional Status Assessment Patient has had a recent decline in their functional status and/or demonstrates limited ability to make significant improvements in function in a reasonable and predictable amount of time  Frequency and Duration min 2x/week  2 weeks       Prognosis Prognosis for Safe Diet Advancement: Fair Barriers to Reach Goals: Cognitive deficits;Severity of deficits      Swallow Study   General Date of  Onset: 04/18/22 HPI: 67 year old female with past medical history of diabetes mellitus type 1, stage IIIa chronic kidney disease, hypertension and ongoing alcohol use who was brought in for worsening confusion for  the last month.  Workup revealed urinary tract infection, acute kidney injury, hypomagnesemia, hyperkalemia.and Wernicke's encephalopathy.  While on floor she continued to deteriorate and has become unresponsive with jerky movements. PCCM consultation for concern patient developing Delerium Tremens. Pt intubated 01/01 thru 04/24/2022. Type of Study: Bedside Swallow Evaluation Previous Swallow Assessment: none in chart Diet Prior to this Study: NPO Temperature Spikes Noted: No Respiratory Status: Room air History of Recent Intubation: Yes Length of Intubations (days): 4 days Date extubated: 04/24/22 Behavior/Cognition:  (see impressions statement) Oral Cavity Assessment:  (unable to visualize) Oral Care Completed by SLP: Recent completion by staff Oral Cavity - Dentition: Adequate natural dentition Self-Feeding Abilities: Total assist Patient Positioning: Upright in bed Baseline Vocal Quality: Not observed Volitional Cough: Cognitively unable to elicit Volitional Swallow: Unable to elicit    Oral/Motor/Sensory Function Overall Oral Motor/Sensory Function:  (see impressions statement)   Ice Chips Ice chips: Impaired Presentation: Spoon Oral Phase Impairments: Poor awareness of bolus Oral Phase Functional Implications: Prolonged oral transit Pharyngeal Phase Impairments: Suspected delayed Swallow;Decreased hyoid-laryngeal movement;Change in Vital Signs;Cough - Immediate   Thin Liquid Thin Liquid: Not tested    Nectar Thick Nectar Thick Liquid: Not tested   Honey Thick Honey Thick Liquid: Not tested   Puree Puree: Not tested   Solid     Solid: Not tested     Laurie Lin, M.S., CCC-SLP, Mining engineer Certified Brain Injury [place]  [place] Office [phone] Ascom [phone] Fax [phone]

## 2022-04-25 NOTE — TOC Initial Note (Addendum)
Transition of Care [facility]) - Initial/Assessment Note    Patient Details  Name: Laurie Lin MRN: [id number] Date of Birth: 1955/08/06  Transition of Care [facility]) CM/SW Contact:    Magnus Ivan, LCSW Phone Number: 04/25/2022, 5:35 PM  Clinical Narrative:      Spoke with patients spouse Laurie Lin). Laurie Lin reported they are wondering if patient will need SNF after DC from the hospital. Explained that we are awaiting PT/OT evals. Laurie Lin verbalized understanding and stated they do want SNF if it is recommended, and prefer [facility] in [place] as patient has been there before. At baseline, patient lives with spouse who provides transportation. Patients PCP recently stopped practicing so Laurie Lin asked for resources, TOC to provide at DC. [facility] [place]. No DME at home. TOC will continue to follow for PT and OT recommendations.               Expected Discharge Plan: Skilled Nursing Facility Barriers to Discharge: Continued Medical Work up   Patient Goals and CMS Choice Patient states their goals for this hospitalization and ongoing recovery are:: SNF CMS Medicare.gov Compare Post Acute Care list provided to:: Patient Represenative (must comment) Choice offered to / list presented to : Spouse      Expected Discharge Plan and Services       Living arrangements for the past 2 months: Single Family Home                                      Prior Living Arrangements/Services Living arrangements for the past 2 months: Single Family Home Lives with:: Spouse Patient language and need for interpreter reviewed:: Yes Do you feel safe going back to the place where you live?: Yes      Need for Family Participation in Patient Care: Yes (Comment) Care giver support system in place?: Yes (comment)   Criminal Activity/Legal Involvement Pertinent to Current Situation/Hospitalization: No - Comment as needed  Activities of Daily Living Home Assistive Devices/Equipment: None ADL Screening  (condition at time of admission) Patient's cognitive ability adequate to safely complete daily activities?: No Is the patient deaf or have difficulty hearing?: No Does the patient have difficulty seeing, even when wearing glasses/contacts?: No Does the patient have difficulty concentrating, remembering, or making decisions?: Yes Patient able to express need for assistance with ADLs?: Yes Does the patient have difficulty dressing or bathing?: Yes Independently performs ADLs?: No Does the patient have difficulty walking or climbing stairs?: Yes Weakness of Legs: Both Weakness of Arms/Hands: Both  Permission Sought/Granted Permission sought to share information with : Facility Art therapist granted to share information with : Yes, Verbal Permission Granted (by spouse)     Permission granted to share info w AGENCY: SNFs        Emotional Assessment         Alcohol / Substance Use: Not Applicable Psych Involvement: No (comment)  Admission diagnosis:  Alcohol abuse [F10.10] Encephalopathy acute [G93.40] Hyperglycemia [R73.9] Altered mental status, unspecified altered mental status type [R41.82] Patient Active Problem List   Diagnosis Date Noted   Aspiration pneumonia ([place]) 04/25/2022   Overweight (BMI 25.0-29.9) 04/19/2022   Encephalopathy acute 04/18/2022   Alcohol abuse 04/18/2022   GERD without esophagitis 04/18/2022   Acute renal failure superimposed on stage 3a chronic kidney disease ([place]) 04/18/2022   Wernicke encephalopathy syndrome 04/18/2022   Trochanteric bursitis of left hip 01/17/2020  Anemia in chronic kidney disease 11/03/2019   Chronic kidney disease, stage 3 unspecified ([place]) 11/03/2019   Burn of second degree of left foot, initial encounter 12/28/2018   Neurogenic pain 09/30/2017   Anemia 09/30/2017   Hyponatremia 09/30/2017   Hypocalcemia 09/30/2017   Hypoalbuminemia 09/30/2017   Diabetes mellitus with stage 4 chronic kidney disease  GFR 15-29 ([place]) 09/30/2017   Diabetic peripheral neuropathy ([place]) 09/30/2017   Chronic pain of hip (B) 09/30/2017   Chronic sacroiliac joint pain (B) 09/30/2017   Lumbar facet joint syndrome 09/30/2017   H/O diabetic foot ulcer 09/09/2017   Chronic low back pain (Primary Area of Pain) (Bilateral) (R>L) w/ sciatica (Bilateral) 09/09/2017   Chronic lower extremity pain (Secondary Area of Pain) (Bilateral) (R>L) 09/09/2017   Chronic pain syndrome 09/09/2017   Long term current use of opiate analgesic 09/09/2017   Pharmacologic therapy 09/09/2017   Disorder of skeletal system 09/09/2017   Problems influencing health status 09/09/2017   Other cirrhosis of liver ([place]) 06/03/2017   Chronic lumbar radiculopathy 06/03/2017   Chronic neck pain 06/03/2017   Type 1 diabetes mellitus with hyperglycemia ([place]) 04/30/2017   Atrial flutter ([place]) 04/30/2017   Neuropathy 04/30/2017   Hiatal hernia 04/30/2017   Cataracts, bilateral 04/30/2017   Albuminuria 04/30/2017   DKA (diabetic ketoacidoses) 03/21/2017   Reactive depression 01/03/2015   Essential hypertension 09/28/2014   Hepatic steatosis 08/17/2013   Elevated liver enzymes 08/17/2013   Abnormal levels of other serum enzymes 08/17/2013   PCP:  Pcp, No Pharmacy:   [id number] [place], [place] [address] [place] [address] Phone: [phone] Fax: [phone]     Social Determinants of Health (SDOH) Social History: SDOH Screenings   Food Insecurity: No Food Insecurity (04/19/2022)  Housing: Low Risk  (04/19/2022)  Transportation Needs: No Transportation Needs (04/19/2022)  Utilities: Not At Risk (04/19/2022)  Tobacco Use: Low Risk  (04/19/2022)   SDOH Interventions:     Readmission Risk Interventions     No data to display

## 2022-04-25 NOTE — Assessment & Plan Note (Signed)
Patient noted to have some low-grade temperature, increased white blood cell count and increased procalcitonin.  Chest x-ray noted new infiltrate, suspicious for aspiration pneumonia.  Started antibiotics and labs are better today.  Speech therapy saw patient and due to mentation, not felt to be cleared for diet.  Currently on tube feeds through [place] tube.  Discussed with family and will consult IR for PEG tube.

## 2022-04-25 NOTE — TOC CM/SW Note (Signed)
Attempted call to spouse per request and to complete high readmission risk assessment.  No answer, left VM requesting return call. TOC will follow for PT/OT/ST recs.  Oleh Genin, [place]

## 2022-04-25 NOTE — Progress Notes (Signed)
Pharmacy Antibiotic Note  Laurie Lin is a 67 y.o. female admitted on 04/17/2022 with aspiration pneumonia. Pharmacy has been consulted for cefepime dosing.  Plan: Cefepime 2 grams every 12 hours (crcl 30-60)  Follow renal function, clinical course, length of therapy.  Height: 5\' 10"  (177.8 cm) Weight: 88.9 kg (195 lb 15.8 oz) IBW/kg (Calculated) : 68.5  Temp (24hrs), Avg:99.7 F (37.6 C), Min:98.2 F (36.8 C), Max:101.4 F (38.6 C)  Recent Labs  Lab 04/21/22 0145 04/22/22 0415 04/23/22 0406 04/23/22 1435 04/24/22 0529 04/24/22 1227 04/25/22 0453 04/25/22 0936  WBC 14.1* 11.5* 10.5  --  9.8  --  13.8*  --   CREATININE 1.56* 1.63* 1.17* 1.35* 1.60* 1.55* 1.45* 1.35*    Estimated Creatinine Clearance: 49 mL/min (A) (by C-G formula based on SCr of 1.35 mg/dL (H)).    Allergies  Allergen Reactions   Ibuprofen Nausea Only   Nsaids     Ulcerative stomach and small intestines   Brimonidine Itching    Red, itchy, sensitivity to light Red, itchy, sensitivity to light    Latex Itching    Other reaction(s): Other (see comments)    Antimicrobials this admission: ceftriaxone 12/30 >> 1/3 doxycyline 12/29 >> 12/30  Dose adjustments this admission: N/a  Microbiology results: 1/3 Bcx: no organisms; NG x 2 days 12/29 Ucx: K. pneumoniae 12/31 MRSA PCR: Not detected  Thank you for allowing pharmacy to be a part of this patient's care.   Glean Salvo, PharmD, BCPS Clinical Pharmacist  04/25/2022 2:17 PM

## 2022-04-25 NOTE — Plan of Care (Signed)

## 2022-04-25 NOTE — Progress Notes (Signed)
[facility] Progress Note  Patient: Laurie Lin    JME:[id number]  DOA: 04/17/2022    Date of Service: the patient was seen and examined on 04/25/2022  Brief hospital course: 67 year old female with past medical history of diabetes mellitus type 1, stage IIIa chronic kidney disease, hypertension and ongoing alcohol use who was brought in for worsening confusion for the last month.  Workup revealed urinary tract infection, acute kidney injury, hypomagnesemia and hyperkalemia.  MRI unremarkable for CVA.  On evening of 12/30, patient started becoming slightly more agitated requiring Ativan.  As 12/31 progressed, patient became even more agitated despite Ativan.  Discussed with critical care.  Patient to be transferred to critical care service to the ICU and started on Precedex drip.  With worsening agitation, patient intubated on 1/1.  By 1/3, able to be weaned off of Precedex and able to be extubated on 1/4.  Patient also with Klebsiella UTI, treated with course of Rocephin.   Assessment and Plan: * Encephalopathy acute - This is concerning for alcoholic Wernicke's encephalopathy. - The patient will be admitted to a medical telemetry bed.  Will try to treat underlying issues of AKI, bronchitis and UTI.  MRI ruled out CVA, but does note significant wear and tear disproportionate for patient's age.  Discussed with the patient's husband and he understands.  If patient not improved, husband is interested in a long-term care facility.  Decompensated requiring Precedex drip and mechanical intubation.  Patient able to be weaned off Precedex and extubated 1/4.  She is now more appropriate and still weak and still slightly confused, but answering some questions.  Will take out of ICU.  PT and OT to see.  Aspiration pneumonia ([place]) Patient noted to have some low-grade temperature and white blood cell count mildly increased today.  Procalcitonin slightly elevated from previous despite treatment with  Rocephin and Zithromax for bronchitis and UTI. (See below).  Repeat UA unremarkable, but chest x-ray notes new infiltrate.  Highly suspicious for aspiration pneumonia.  Will start antibiotics and have speech therapy see.  Acute renal failure superimposed on stage 3a chronic kidney disease ([place]) - This is likely prerenal due to volume depletion and dehydration, as well as low albumin.  Continue bicarb drip  Alcohol abuse According to husband, patient drinks 7-8 beers per day and has not had any alcohol in at least several days before coming in.  On CIWA protocol.  Being monitored for withdrawals.  Blood pressures are trending up slightly as well as mild tachycardia.  Type 1 diabetes mellitus with hyperglycemia (HCC) - The patient will be placed on supplement coverage with NovoLog and we will continue her basal coverage.  A1c notes moderate control at 7.2, although I suspect some of her overall " controlled blood sugars" are due to poor calorie intake secondary to alcoholism.  CBGs initially in the 3-4 100s.  Not in DKA.  Appreciate diabetes coordinator help.  Now with sliding scale and subcu insulin.  Bronchitis-resolved as of 04/25/2022 Completed course of antibiotics to Rocephin and Zithromax  UTI due to Klebsiella species-resolved as of 04/25/2022 Urine cultures growing out pansensitive Klebsiella.  Completed course of Rocephin  Essential hypertension - We will place on as needed IV labetalol.  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  BNP elevated at 356 and blood pressures are starting to trend upward, either from withdrawal or from fluids.  Echocardiogram noted normal diastolic function and ejection fraction of 50 to 55%, on the low end of normal.  GERD without esophagitis - We will continue her PPI therapy.  Overweight (BMI 25.0-29.9) Meets criteria for BMI greater than 25  Acute respiratory failure with hypoxia (HCC)-resolved as of 04/25/2022 Secondary to agitation.  Required  mechanical intubation.  Hypernatremia-resolved as of 04/25/2022 Secondary to lack of p.o. intake while she is confused.  Change IV fluids to half-normal saline.       Body mass index is 28.12 kg/m.  Nutrition Problem: Inadequate oral intake Etiology: inability to eat (pt sedated and ventilated)     Consultants: None  Procedures: Echocardiogram: Results pending  Antimicrobials: IV Rocephin and Zithromax 12/30-present IV doxycycline 12/29 - 12/30  Code Status: Full code   Subjective: Somnolent  Objective: Vital signs were reviewed and unremarkable. Vitals:   04/25/22 1100 04/25/22 1200  BP: (!) 140/62 (!) 148/56  Pulse: 91 86  Resp: (!) 21 (!) 21  Temp:  98.2 F (36.8 C)  SpO2: 99% 99%    Intake/Output Summary (Last 24 hours) at 04/25/2022 1341 Last data filed at 04/25/2022 1205 Gross per 24 hour  Intake 3089.67 ml  Output 2250 ml  Net 839.67 ml   Filed Weights   04/17/22 1436 04/23/22 1144 04/25/22 0456  Weight: 93 kg 88.9 kg 88.9 kg   Body mass index is 28.12 kg/m.  Exam:  General: Awake, mildly confused although much improved.  Interactive.  Fatigued. HEENT: Poor dentition, mucous membranes are dry.  Feeding tube in place Cardiovascular: Regular rate and rhythm, S1-S2 Respiratory: Decreased breath bibasilar with poor inspiratory effort.  No crackles or wheezes Abdomen: Soft, nontender, nondistended, hypoactive bowel sounds Musculoskeletal: No clubbing or cyanosis, trace pitting edema Skin: No skin breaks, tears or lesions Psychiatry: Appropriate, no evidence of psychoses Neurology: No focal deficits  Data Reviewed: Normal repeat urinalysis.  Elevated procalcitonin of 0.67.  White blood cell count of 13.  BNP of 456.  Disposition:  Status is: Inpatient Remains inpatient appropriate because:  -Improvement in creatinine -Diuresing -Treatment of aspiration pneumonia/checking swallow evaluation -Hopefully improvement in mentation and if not,  determination of disposition    Anticipated discharge date: 1/10  Family Communication: Husband and daughter at the bedside DVT Prophylaxis: enoxaparin (LOVENOX) injection 40 mg Start: 04/19/22 1000    Author: Annita Brod ,MD 04/25/2022 1:41 PM  To reach On-call, see care teams to locate the attending and reach out via [email or web address]. Between 7PM-7AM, please contact night-coverage If you still have difficulty reaching the attending provider, please page the [facility] (Director on Call) for [facility] on amion for assistance.

## 2022-04-25 NOTE — Progress Notes (Signed)
OT Cancellation Note  Patient Details Name: SHATERRICA TERRITO MRN: [id number] DOB: 11/06/1955   Cancelled Treatment:    Reason Eval/Treat Not Completed: Other (comment) (Pt not following any commands or able to answer any questions today; will attempt again tomorrow or when medically ready.) OT attempted to engage pt in questions and in movement of body (asking pt to "high five" OT and move hands around. Hands in mitts). Pt not following any commands or answering any questions; appeared to be trying to speak, but not able to adequately communicate. Will attempt again at a later date when pt is able to participate.   Vania Rea 04/25/2022, 1:21 PM

## 2022-04-25 NOTE — Consult Note (Addendum)
PHARMACY CONSULT NOTE - ELECTROLYTES  Pharmacy Consult for Electrolyte Monitoring and Replacement   Recent Labs: Potassium (mmol/L)  Date Value  04/25/2022 3.7   Magnesium (mg/dL)  Date Value  04/25/2022 1.6 (L)   Calcium (mg/dL)  Date Value  04/25/2022 8.2 (L)   Albumin (g/dL)  Date Value  04/24/2022 2.1 (L)   Phosphorus (mg/dL)  Date Value  04/25/2022 3.6   Sodium  Date Value  04/25/2022 140 mmol/L  03/29/2019 142   Corrected Ca: 9.9 mg/dL  Assessment  Laurie Lin is a 67 y.o. female presenting with encephalopathy. PMH significant for T1DM, HTN, AUD, GERD, hiatal hernia, neuropathy, cirrhosis, varices, CKD3. Pharmacy has been consulted to monitor and replace electrolytes.  Diet: TF @ 60 MIVF: Bicarb @ 75 mL/hr (last CO2 23) - d/c'd 1/5  Goal of Therapy: Electrolytes WNL  Plan:  Potassium: 3.4 >> 3.8, no replacement needed Magnesium: 1.6. Magnesium sulfate IV 4 grams x 1 Phosphorus: 3.6 after replacement on 1/4. No replacement needed Check BMP, Mg, Phos with AM labs  Thank you for allowing pharmacy to be a part of this patient's care.  Glean Salvo, PharmD, BCPS Clinical Pharmacist  04/25/2022 2:23 PM

## 2022-04-26 ENCOUNTER — Inpatient Hospital Stay: Payer: Medicare Other

## 2022-04-26 DIAGNOSIS — N179 Acute kidney failure, unspecified: Secondary | ICD-10-CM | POA: Diagnosis not present

## 2022-04-26 DIAGNOSIS — G9340 Encephalopathy, unspecified: Secondary | ICD-10-CM | POA: Diagnosis not present

## 2022-04-26 DIAGNOSIS — F1010 Alcohol abuse, uncomplicated: Secondary | ICD-10-CM | POA: Diagnosis not present

## 2022-04-26 DIAGNOSIS — J690 Pneumonitis due to inhalation of food and vomit: Secondary | ICD-10-CM | POA: Diagnosis not present

## 2022-04-26 LAB — GLUCOSE, CAPILLARY
Glucose-Capillary: 253 mg/dL — ABNORMAL HIGH (ref 70–99)
Glucose-Capillary: 215 mg/dL — ABNORMAL HIGH (ref 70–99)
Glucose-Capillary: 219 mg/dL — ABNORMAL HIGH (ref 70–99)
Glucose-Capillary: 267 mg/dL — ABNORMAL HIGH (ref 70–99)
Glucose-Capillary: 273 mg/dL — ABNORMAL HIGH (ref 70–99)

## 2022-04-26 LAB — PHOSPHORUS: Phosphorus: 2.5 mg/dL (ref 2.5–4.6)

## 2022-04-26 LAB — CBC
HCT: 31.4 % — ABNORMAL LOW (ref 36.0–46.0)
Hemoglobin: 10.4 g/dL — ABNORMAL LOW (ref 12.0–15.0)
MCH: 32.7 pg (ref 26.0–34.0)
MCHC: 33.1 g/dL (ref 30.0–36.0)
MCV: 98.7 fL (ref 80.0–100.0)
Platelets: 518 10*3/uL — ABNORMAL HIGH (ref 150–400)
RBC: 3.18 MIL/uL — ABNORMAL LOW (ref 3.87–5.11)
RDW: 12.8 % (ref 11.5–15.5)
WBC: 12.2 10*3/uL — ABNORMAL HIGH (ref 4.0–10.5)
nRBC: 0 % (ref 0.0–0.2)

## 2022-04-26 LAB — BASIC METABOLIC PANEL
Anion gap: 10 (ref 5–15)
BUN: 26 mg/dL — ABNORMAL HIGH (ref 8–23)
CO2: 25 mmol/L (ref 22–32)
Calcium: 8.8 mg/dL — ABNORMAL LOW (ref 8.9–10.3)
Chloride: 107 mmol/L (ref 98–111)
Creatinine, Ser: 1.27 mg/dL — ABNORMAL HIGH (ref 0.44–1.00)
GFR, Estimated: 46 mL/min — ABNORMAL LOW (ref >60)
Glucose, Bld: 211 mg/dL — ABNORMAL HIGH (ref 70–99)
Potassium: 3.9 mmol/L (ref 3.5–5.1)
Sodium: 142 mmol/L (ref 135–145)

## 2022-04-26 LAB — CULTURE, RESPIRATORY W GRAM STAIN: Culture: NORMAL

## 2022-04-26 LAB — MAGNESIUM: Magnesium: 1.8 mg/dL (ref 1.7–2.4)

## 2022-04-26 LAB — PROCALCITONIN: Procalcitonin: 0.44 ng/mL

## 2022-04-26 MED ORDER — ORAL CARE MOUTH RINSE
15.0000 mL | OROMUCOSAL | Status: DC
  Administered 2022-04-26 – 2022-05-06 (×34): 15 mL via OROMUCOSAL

## 2022-04-26 MED ORDER — METOPROLOL TARTRATE 5 MG/5ML IV SOLN
5.0000 mg | Freq: Four times a day (QID) | INTRAVENOUS | Status: DC
  Administered 2022-04-26 – 2022-05-06 (×40): 5 mg via INTRAVENOUS
  Filled 2022-04-26 (×39): qty 5

## 2022-04-26 MED ORDER — ORAL CARE MOUTH RINSE: 15.0000 mL | OROMUCOSAL | Status: DC

## 2022-04-26 MED ORDER — ORAL CARE MOUTH RINSE: 15.0000 mL | OROMUCOSAL | Status: DC | PRN

## 2022-04-26 MED ORDER — HYDROXYZINE HCL 10 MG PO TABS
10.0000 mg | ORAL_TABLET | Freq: Three times a day (TID) | ORAL | Status: DC | PRN
  Administered 2022-04-26 – 2022-05-05 (×5): 10 mg
  Filled 2022-04-26 (×8): qty 1

## 2022-04-26 MED ORDER — MAGNESIUM SULFATE 2 GM/50ML IV SOLN
2.0000 g | Freq: Once | INTRAVENOUS | Status: AC
  Administered 2022-04-26: 2 g via INTRAVENOUS
  Filled 2022-04-26: qty 50

## 2022-04-26 NOTE — Progress Notes (Signed)
Inpatient Rehab Admissions Coordinator:   Per therapy recommendations, patient was screened for CIR candidacy by Lealand Elting, MS, CCC-SLP. At this time, Pt. is not yet at a level to tolerate the intensity of CIR.  Pt. may have potential to progress to becoming a potential CIR candidate, so CIR admissions team will follow and monitor for progress and participation with therapies and place consult order if Pt. appears to be an appropriate candidate. Please contact me with any questions.   Yazmin Locher, MS, CCC-SLP Rehab Admissions Coordinator  [phone] (celll) [phone] (office)   

## 2022-04-26 NOTE — Progress Notes (Signed)
Patient orientation wavering throughout day. Patient orientation indeterminate as patient states 'yes" to all questions. Patient does respond appropriately when you call her name and tracks staff in the room. Family at bedside to meet with MD. SLP evaluated patient and updated nurse regarding oral intake. See not provided by SLP. Patient continues to refuse oral care and pinches lips closed and attempting to bite swab. Moisturizer applied to patient's lips to alleviate dryness. Patient repositioned and appears comfortable in bed. Warm blanket applied to patient.

## 2022-04-26 NOTE — Progress Notes (Signed)
Speech Language Pathology Treatment: Dysphagia  Patient Details Name: Laurie Lin MRN: [id number] DOB: 1955-12-19 Today's Date: 04/26/2022 Time: 2725-3664 SLP Time Calculation (min) (ACUTE ONLY): 45 min  Assessment / Plan / Recommendation Clinical Impression  Pt seen for ongoing assessment of swallowing; oropharyngeal stimulation of swallowing via oral care and ice chip trials presentation. Pt continues w/ bilateral mitts in place; monitoring for impulsive/agitated behaviors. Pt w/ eyes open and occasional eye contact w/ this SLP and w/ Family at end of session but nothing sustained. Pt did not able to follow directions, did not attempt to verbalize w/ this SLP. On RA; afebrile. WBC 12.2(elevated). Also noted CXR decline in presentation(04/25/22).   This was a limited assessment d/t pt's reduced participation in oral care prior to being given ice chip trials, despite encouragement and verbal/tactile/visual cues.  Pt appears at increased risk for aspiration/aspiration pneumonia in current presentation/status d/t declined awareness for task of oral intake and encephalopathy. There is SIGNIFICANT concern for sensorimotor-based pharyngeal phase dysphagia thus reduced airway protection. Also noted, pt tends to lean to her R side, strong head tilt to R. She did not turn head to midline for this SLP despite cues. She required Mod+ positioning support to achieve a semi-midline positioning.   Oral care was attempted w/ swabs and washcloth. Responses to the oral stim were c/b defensive oral behaviors: pulling head back/away, tightened lips, then tonic biting on swabs sticks. The swab was barely removed 1x. Ice chip trials (2) were given -- immediate and delayed congested coughing followed x2/2 trials No overall respiratory decline was observed but pt had noted difficulty attempting to manage her own Phlegm and secretions. Pt's breathing returned to calm and unlabored given time. Oral phase c/b prolonged Time  for bolus management and A-P transfer w/ trials -- suspect decreased attention to task. No further ice chips given.    STRONGLY RECOMMEND NO ICE CHIPS BE GIVEN UNLESS ORAL CARE CAN BE COMPLETED FIRST W/ PT'S FULL ENGAGEMENT AND PARTICIPATION TO THOROUGHLY CLEAN HER MOUTH TO REDUCE RISK OF ASPIRATION OF ORAL BACTERIA IF/WHEN ASPIRATION OCCURS.  NSG updated, agreed. She had been holding on ice chips, she reported d/t pt's presentation. Chart order updated for NSG.   The above tx session results and recommendations were explained to Husband, Dtr when they arrived; much education was given on swallowing/Dysphagia, aspiration/aspiration pneumonia, REHAB.  Recommended Husband discuss w/ MD the option of more long term feeding (PEG) as pt is not exhibiting immediate improvements for safe oral intake to meet full nutrition/hydration needs. Her REHAB could be lengthy d/t the impact of the Cognitive-communication deficits.  ST services will continue to follow for ongoing assessment and po readiness. MD and NSG updated on tx session and recommendations also.        HPI HPI: 67 year old female with past medical history of ETOH abuse, diabetes mellitus type 1, stage IIIa chronic kidney disease, hypertension and ongoing Alcohol use who was brought in for worsening confusion for the last month.  Workup revealed urinary tract infection, acute kidney injury, hypomagnesemia, hyperkalemia.and Wernicke's encephalopathy.  While on floor she continued to deteriorate and has become unresponsive with jerky movements. PCCM consultation for concern patient developing Delerium Tremens. Pt required oral intubation; intubated 01/01 thru 04/24/2022.   CXR on 04/25/22: New right perihilar airspace opacity suspicious for early  pneumonia.  2. Hazy accentuated density at the right lung base with obscuration  of the right lateral hemidiaphragm, potentially from atelectasis or  pneumonia.  3. Airway thickening is  present, suggesting  bronchitis or reactive  airways disease.      SLP Plan  Continue with current plan of care      Recommendations for follow up therapy are one component of a multi-disciplinary discharge planning process, led by the attending physician.  Recommendations may be updated based on patient status, additional functional criteria and insurance authorization.    Recommendations  Diet recommendations: NPO Medication Administration: Via alternative means                General recommendations:  (GO consult for potential need for PEG placement) Oral Care Recommendations: Oral care QID;Oral care before and after PO;Staff/trained caregiver to provide oral care Follow Up Recommendations: Skilled nursing-short term rehab (<3 hours/day) (TBD) Assistance recommended at discharge: Frequent or constant Supervision/Assistance SLP Visit Diagnosis: Dysphagia, oropharyngeal phase (R13.12) (Encephalopathy) Plan: Continue with current plan of care            Orinda Kenner, [place], [place]; [place] [phone] (ascom) Kortnie Stovall  04/26/2022, 4:28 PM

## 2022-04-26 NOTE — Progress Notes (Addendum)
       CROSS COVER NOTE  NAME: ARCHER VISE MRN: [id number] DOB : 12-12-1955 ATTENDING PHYSICIAN: Annita Brod, MD    Date of Service   04/26/2022   HPI/Events of Note   Message received from RN reporting patient has pulled NG tube.  NGT is currently inserted at 55 cm marker.   Interventions   Assessment/Plan:  Reinsert to previously documented 65cm Abd Xray to verify placement.   2318: NGT overlying stomach, OK  for nursing to use       To reach the provider On-Call:   7AM- 7PM see care teams to locate the attending and reach out to them via [email or web address]. 7PM-7AM contact night-coverage If you still have difficulty reaching the appropriate provider, please page the [facility] (Director on Call) for [facility] on amion for assistance  This document was prepared using Set designer software and may include unintentional dictation errors.  Neomia Glass DNP, MBA, FNP-BC Nurse Practitioner [facility] Pager [phone]

## 2022-04-26 NOTE — Progress Notes (Signed)
Patient scratching at arms and legs. MD ordered PRN Atarax. Provided to patient by this RN. Patient dislodged right arm IV. Site clean and dry with gauze applied. PIV removed from [place]. PIV in right wrist remains and can be flushed without blood return. Patient reaching for NG tube attempting to remove and biting at mittens on hands. Patient educated if she removes NG tube, It will have to be replaced. Patient nods head indicating she understands. Monitoring patient closely in attempt to prevent patient from removing any lines or tubes. Direct visualization of patient from nurses station for monitoring.

## 2022-04-26 NOTE — Consult Note (Signed)
PHARMACY CONSULT NOTE - ELECTROLYTES  Pharmacy Consult for Electrolyte Monitoring and Replacement   Recent Labs: Potassium (mmol/L)  Date Value  04/26/2022 3.9   Magnesium (mg/dL)  Date Value  04/26/2022 1.8   Calcium (mg/dL)  Date Value  04/26/2022 8.8 (L)   Albumin (g/dL)  Date Value  04/24/2022 2.1 (L)   Phosphorus (mg/dL)  Date Value  04/26/2022 2.5   Sodium  Date Value  04/26/2022 142 mmol/L  03/29/2019 142   Corrected Ca: 9.9 mg/dL  Assessment  Laurie Lin is a 67 y.o. female presenting with encephalopathy. PMH significant for T1DM, HTN, AUD, GERD, hiatal hernia, neuropathy, cirrhosis, varices, CKD3. Pharmacy has been consulted to monitor and replace electrolytes while in CCU.  Diet: osmolite 1.5 at 60 mL/hr + free water flushes 100 mL per tube every 4 hours  Goal of Therapy: Electrolytes WNL  Plan:  No electrolyte replacement warranted for today Check electrolytes with AM labs  Thank you for allowing pharmacy to be a part of this patient's care.  Vallery Sa, PharmD, BCPS Clinical Pharmacist  04/26/2022 1:25 PM

## 2022-04-26 NOTE — Progress Notes (Signed)
[facility] Progress Note  Patient: Laurie Lin    KJZ:[id number]  DOA: 04/17/2022    Date of Service: the patient was seen and examined on 04/26/2022  Brief hospital course: 67 year old female with past medical history of diabetes mellitus type 1, stage IIIa chronic kidney disease, hypertension and ongoing alcohol use who was brought in for worsening confusion for the last month.  Workup revealed urinary tract infection, acute kidney injury, hypomagnesemia and hyperkalemia.  MRI unremarkable for CVA.  On evening of 12/30, patient started becoming slightly more agitated requiring Ativan.  As 12/31 progressed, patient became even more agitated despite Ativan.  Discussed with critical care.  Patient to be transferred to critical care service to the ICU and started on Precedex drip.  With worsening agitation, patient intubated on 1/1.  By 1/3, able to be weaned off of Precedex and able to be extubated on 1/4.  Patient also with Klebsiella UTI, treated with course of Rocephin.  [facility] assumed care 1/5 and patient started on IV antibiotics for aspiration pneumonia.  Mentation waxing and waning and unable to clear swallow evaluation.  Patient currently on tube feeds, and after discussion with family, plan is for patient to get PEG tube.    Assessment and Plan: * Encephalopathy acute MRI ruled out CVA, but does note significant wear and tear disproportionate for patient's age.  Discussed with the patient's husband and he understands.  If patient not improved, husband is interested in a long-term care facility.  Decompensated requiring Precedex drip and mechanical intubation.  Patient able to be weaned off Precedex and extubated 1/4.  She is now more appropriate and still weak and still slightly confused, but answering some questions.  Will take out of ICU.  PT and OT recommending CIR, but patient not felt to be good candidate for CIR given waxing waning mental status, so would need  skilled nursing.  At this time, it is unclear how much of her mentation issues are going to be long-term.  By treating underlying infection, taking out of ICU and minimizing delirium, monitoring for over sedating medications and improving nutrition, hopefully she will improve   Aspiration pneumonia [facility]) Patient noted to have some low-grade temperature, increased white blood cell count and increased procalcitonin.  Chest x-ray noted new infiltrate, suspicious for aspiration pneumonia.  Started antibiotics and labs are better today.  Speech therapy saw patient and due to mentation, not felt to be cleared for diet.  Currently on tube feeds through Dobbhoff tube.  Discussed with family and will consult IR for PEG tube.    Acute renal failure superimposed on stage 3a chronic kidney disease ([place]) - This is likely prerenal due to volume depletion and dehydration, as well as low albumin.  Continue bicarb drip  Alcohol abuse According to husband, patient was drinking 7-8 beers per day and has not had any alcohol in at least several days before coming in.  Status post full withdrawals.  Now on vitamin supplementation  Type 1 diabetes mellitus with hyperglycemia ([place]) - The patient will be placed on supplement coverage with NovoLog and we will continue her basal coverage.  A1c notes moderate control at 7.2, although I suspect some of her overall " controlled blood sugars" are due to poor calorie intake secondary to alcoholism.  CBGs initially in the 300-400s.  Not in DKA.  Appreciate diabetes coordinator help.  Now with sliding scale and subcu insulin.  Bronchitis-resolved as of 04/25/2022 Completed course of antibiotics to Rocephin and Zithromax  UTI due to Klebsiella species-resolved as of 04/25/2022 Urine cultures growing out pansensitive Klebsiella.  Completed course of Rocephin  Essential hypertension BNP elevated at 356 and blood pressures were starting to trend upward.  Echocardiogram noted normal  diastolic function and ejection fraction of 50 to 55%, on the low end of normal.  Patient has received 1 dose of IV Lasix.  Will add scheduled IV Lopressor given heart rate staying in the 90s and blood pressures ranging from the 130s to 170s.  Once PEG tube in place, can change to p.o. beta-blocker  GERD without esophagitis - We will continue her PPI therapy.  Overweight (BMI 25.0-29.9) Meets criteria for BMI greater than 25  Acute respiratory failure with hypoxia (HCC)-resolved as of 04/25/2022 Secondary to agitation.  Required mechanical intubation.  Hypernatremia-resolved as of 04/25/2022 Secondary to lack of p.o. intake while she is confused.  Change IV fluids to half-normal saline.       Body mass index is 27.96 kg/m.  Nutrition Problem: Inadequate oral intake Etiology: inability to eat (pt sedated and ventilated)     Consultants: Critical care Interventional radiology  Procedures: Echocardiogram: Preserved ejection fraction, no diastolic dysfunction  Antimicrobials: IV Rocephin and Zithromax 12/30-present IV doxycycline 12/29 - 12/30 IV cefepime 1/5-present  Code Status: Full code   Subjective: Awake, confused, not agitated  Objective: Vital signs were reviewed and unremarkable. Vitals:   04/26/22 1130 04/26/22 1200  BP: (!) 153/76 (!) 146/69  Pulse: 79 92  Resp: 18 (!) 21  Temp:    SpO2: 96% 97%    Intake/Output Summary (Last 24 hours) at 04/26/2022 1400 Last data filed at 04/26/2022 1200 Gross per 24 hour  Intake 2375.91 ml  Output 3075 ml  Net -699.09 ml   Filed Weights   04/23/22 1144 04/25/22 0456 04/26/22 0404  Weight: 88.9 kg 88.9 kg 88.4 kg   Body mass index is 27.96 kg/m.  Exam:  General: Awake, remains confused HEENT: Poor dentition, mucous membranes are dry.  Dobbhoff tube in place Cardiovascular: Regular rate and rhythm, S1-S2 Respiratory: Decreased breath bibasilar with poor inspiratory effort.  No crackles or wheezes Abdomen: Soft,  nontender, nondistended, hypoactive bowel sounds Musculoskeletal: No clubbing or cyanosis, trace pitting edema Skin: No skin breaks, tears or lesions Psychiatry: Appropriate, no evidence of psychoses Neurology: No focal deficits  Data Reviewed: Improving creatinine, procalcitonin and white blood cell count.  Disposition:  Status is: Inpatient Remains inpatient appropriate because:  -Improvement in creatinine -Needs skilled nursing -Placement of PEG tube and transition of IV medications to pills    Anticipated discharge date: 1/12  Family Communication: Updated husband and daughter by phone DVT Prophylaxis: enoxaparin (LOVENOX) injection 40 mg Start: 04/19/22 1000    Author: Annita Brod ,MD 04/26/2022 2:00 PM  To reach On-call, see care teams to locate the attending and reach out via [email or web address]. Between 7PM-7AM, please contact night-coverage If you still have difficulty reaching the attending provider, please page the [facility] (Director on Call) for [facility] on amion for assistance.

## 2022-04-26 NOTE — Progress Notes (Signed)
Pt ISHAPED report called to RN Pt vitals stable Pt belongings at the bedside Pt Transferred to 125

## 2022-04-26 NOTE — Evaluation (Signed)
Occupational Therapy Evaluation Patient Details Name: Laurie Lin MRN: [id number] DOB: 02-03-1956 Today's Date: 04/26/2022   History of Present Illness Pt is a 67 y/o F admitted on 04/17/22 after presenting with c/o worsening confusion for the last month. Work up revealed UTI, AKI, hypomagnesemia, hyperkalemia. During admission pt became more agitated & intubated on 1/1, extubated 1/4. PMH: DM1, CKD 3A, HTN, alcohol use   Clinical Impression   Chart reviewed, nurse cleared pt for participation in OT evaluation. Co tx completed with PT. Pt is alert and oriented x4, responds to name being called. Pt responds "yes" to most questions, inconsistent one step direction following. Pt attempting to pull at lines/leads throughout evaluation. Per chart pt has assist for IADLs, ADLs with MOD I-supervision, amb with no AD however recent fall history. RUE appears stronger than LUE with tightness/start of a contracture noted in L hand.  Pt often reaching for lines with R hand. Pt presents with deficits in strength, endurance, activity tolerance, cognition, balance all affecting optimal ADL completion. Pt may benefit from L hand splinting in the future.  At this time recommend discharge to intensive rehab to facilitate optimal return to PLOF. OT will continue to follow acutely.   Pt left in bed, safety maintained, all needs met. RN aware of pt status.      Recommendations for follow up therapy are one component of a multi-disciplinary discharge planning process, led by the attending physician.  Recommendations may be updated based on patient status, additional functional criteria and insurance authorization.   Follow Up Recommendations  Acute inpatient rehab (3hours/day)     Assistance Recommended at Discharge Frequent or constant Supervision/Assistance  Patient can return home with the following Two people to help with walking and/or transfers;Two people to help with bathing/dressing/bathroom     Functional Status Assessment  Patient has had a recent decline in their functional status and demonstrates the ability to make significant improvements in function in a reasonable and predictable amount of time.  Equipment Recommendations  Other (comment) (per next venue of care)    Recommendations for Other Services       Precautions / Restrictions Precautions Precautions: Fall Precaution Comments: NG, foley, rectal tube; mittens Restrictions Weight Bearing Restrictions: No      Mobility Bed Mobility Overal bed mobility: Needs Assistance Bed Mobility: Supine to Sit, Sit to Supine     Supine to sit: Total assist, +2 for physical assistance, +2 for safety/equipment Sit to supine: Total assist, +2 for safety/equipment, +2 for physical assistance   General bed mobility comments: step by step multi modal cueing    Transfers Overall transfer level: Needs assistance   Transfers: Sit to/from Stand Sit to Stand: Total assist, +2 physical assistance, +2 safety/equipment           General transfer comment: MAX A +2 for scoot along edge of bed      Balance Overall balance assessment: Needs assistance Sitting-balance support: Feet supported, Bilateral upper extremity supported Sitting balance-Leahy Scale: Zero   Postural control: Posterior lean   Standing balance-Leahy Scale: Zero                             ADL either performed or assessed with clinical judgement   ADL Overall ADL's : Needs assistance/impaired  General ADL Comments: MAX A for LB dressing, MAX A for grooming tasks     Vision   Additional Comments: will continue to assess- pt inconsistently tracking to thearpist provided objects however visual focus noted on therapist providing directives     Perception     Praxis      Pertinent Vitals/Pain Pain Assessment Pain Assessment: CPOT Facial Expression: Relaxed, neutral Body  Movements: Protection Muscle Tension: Relaxed Compliance with ventilator (intubated pts.): N/A Vocalization (extubated pts.): Talking in normal tone or no sound CPOT Total: 1     Hand Dominance     Extremity/Trunk Assessment Upper Extremity Assessment Upper Extremity Assessment: LUE deficits/detail;Difficult to assess due to impaired cognition;RUE deficits/detail (R appears stronger than the L) RUE Deficits / Details: AROM difficult to assess due to cognition however pt reaching towards lines/leads with RUE, PROM WFL LUE Deficits / Details: shoulder appears WFL, contracture noted in L hand with mild edema, pt grimacing with range of motion; will continue to assess   Lower Extremity Assessment Lower Extremity Assessment: Generalized weakness   Cervical / Trunk Assessment Cervical / Trunk Assessment:  R lateral flexion, improved with facilitation and massage   Communication Communication Communication: Expressive difficulties   Cognition Arousal/Alertness: Awake/alert Behavior During Therapy: Impulsive (pulling at lines/leads/tubes with R Hand) Overall Cognitive Status: Impaired/Different from baseline Area of Impairment: Orientation, Attention, Memory, Following commands, Safety/judgement, Awareness, Problem solving                 Orientation Level: Disoriented to, Person, Place, Time, Situation Current Attention Level: Focused Memory: Decreased short-term memory Following Commands: Follows one step commands inconsistently Safety/Judgement: Decreased awareness of safety, Decreased awareness of deficits Awareness: Intellectual Problem Solving: Slow processing, Decreased initiation, Difficulty sequencing, Requires tactile cues, Requires verbal cues General Comments: follows one step directions approx 50% of the time     General Comments  vitals monitored, appear stable throughout    Exercises     Shoulder Instructions      Home Living Family/patient expects to be  discharged to:: Private residence Living Arrangements: Spouse/significant other Available Help at Discharge: Family;Available 24 hours/day Type of Home: House Home Access: Stairs to enter [employer] of Steps: 2 Entrance Stairs-Rails: Right;Can reach both;Left Home Layout: One level     Bathroom Shower/Tub: Tub/shower unit         Home Equipment: Shower seat          Prior Functioning/Environment Prior Level of Function : Independent/Modified Independent;History of Falls (last six months)             Mobility Comments: amb with no AD, 2 recent falls ADLs Comments: generally MOD I for dressing, grooming; bathing with wipes the last few months; husband assists with IADLs        OT Problem List: Decreased strength;Decreased activity tolerance;Impaired balance (sitting and/or standing);Decreased safety awareness;Decreased cognition;Decreased knowledge of precautions;Decreased knowledge of use of DME or AE      OT Treatment/Interventions: Self-care/ADL training;Patient/family education;Therapeutic exercise;Balance training;Therapeutic activities;DME and/or AE instruction;Cognitive remediation/compensation;Neuromuscular education;Splinting;Modalities;Manual therapy;Visual/perceptual remediation/compensation    OT Goals(Current goals can be found in the care plan section) Acute Rehab OT Goals OT Goal Formulation: Patient unable to participate in goal setting ADL Goals Pt Will Perform Grooming: with modified independence;sitting Pt Will Perform Lower Body Dressing: with supervision Pt Will Transfer to Toilet: with modified independence Pt Will Perform Toileting - Clothing Manipulation and hygiene: with modified independence;sit to/from stand  OT Frequency: Min 3X/week    Co-evaluation PT/OT/SLP Co-Evaluation/Treatment: Yes  Reason for Co-Treatment: Complexity of the patient's impairments (multi-system involvement);Necessary to address cognition/behavior during  functional activity;For patient/therapist safety;To address functional/ADL transfers PT goals addressed during session: Mobility/safety with mobility;Balance OT goals addressed during session: ADL's and self-care      AM-PAC OT "6 Clicks" Daily Activity     Outcome Measure Help from another person eating meals?: Total Help from another person taking care of personal grooming?: A Lot Help from another person toileting, which includes using toliet, bedpan, or urinal?: Total Help from another person bathing (including washing, rinsing, drying)?: Total Help from another person to put on and taking off regular upper body clothing?: A Lot Help from another person to put on and taking off regular lower body clothing?: A Lot 6 Click Score: 9   End of Session Nurse Communication: Mobility status  Activity Tolerance: Patient tolerated treatment well Patient left: in bed;with call bell/phone within reach;with bed alarm set  OT Visit Diagnosis: Other abnormalities of gait and mobility (R26.89)                Time: 7505-1071 OT Time Calculation (min): 20 min Charges:  OT General Charges $OT Visit: 1 Visit OT Evaluation $OT Eval High Complexity: 1 High  Shanon Payor, OTD OTR/L  04/26/22, 11:22 AM

## 2022-04-26 NOTE — Evaluation (Signed)
Physical Therapy Evaluation Patient Details Name: Laurie Lin MRN: [id number] DOB: 12-04-1955 Today's Date: 04/26/2022  History of Present Illness  Pt is a 67 y/o F admitted on 04/17/22 after presenting with c/o worsening confusion for the last month. Work up revealed UTI, AKI, hypomagnesemia, hyperkalemia. During admission pt became more agitated & intubated on 1/1, extubated 1/4. PMH: DM1, CKD 3A, HTN, alcohol use  Clinical Impression  Pt seen for PT evaluation with co-tx with OT for pt & therapists' safety. Prior to admission pt was independent without AD, moving slowly, 2 falls in recent past. Pt resides with her husband who reports he can assist pt upon return home. On this date, pt requires +2 total assist for supine<>Sit & to attempt STS from EOB (unable to come to full standing, only able to clear buttocks from bed with assist from PT & OT). Pt demonstrates impulsivity & decreased awareness. Recommending CIR level of therapies upon d/c to maximize independence, decrease caregiver burden, & decrease fall risk prior to return home.     Recommendations for follow up therapy are one component of a multi-disciplinary discharge planning process, led by the attending physician.  Recommendations may be updated based on patient status, additional functional criteria and insurance authorization.  Follow Up Recommendations Acute inpatient rehab (3hours/day)      Assistance Recommended at Discharge Frequent or constant Supervision/Assistance  Patient can return home with the following  Two people to help with walking and/or transfers;Direct supervision/assist for medications management;Two people to help with bathing/dressing/bathroom;Help with stairs or ramp for entrance;Assist for transportation;Assistance with feeding;Direct supervision/assist for financial management;Assistance with cooking/housework    Equipment Recommendations None recommended by PT  Recommendations for Other Services   Rehab consult    Functional Status Assessment Patient has had a recent decline in their functional status and demonstrates the ability to make significant improvements in function in a reasonable and predictable amount of time.     Precautions / Restrictions Precautions Precautions: Fall Precaution Comments: NG tube, foley, rectal tube, mittens Restrictions Weight Bearing Restrictions: No      Mobility  Bed Mobility Overal bed mobility: Needs Assistance Bed Mobility: Supine to Sit, Sit to Supine     Supine to sit: Total assist, +2 for physical assistance, +2 for safety/equipment Sit to supine: Total assist, +2 for safety/equipment, +2 for physical assistance        Transfers                   General transfer comment: PT & OT attempt to facilitate STS from EOB x 3 times. Pt able to shift anteriorly & weight bear slightly through BLE to allow therapists to assist with raising buttocks off EOB enough to assist with scooting to L along EOB.    Ambulation/Gait                  Stairs            Wheelchair Mobility    Modified Rankin (Stroke Patients Only)       Balance Overall balance assessment: Needs assistance Sitting-balance support: Feet supported, Bilateral upper extremity supported Sitting balance-Leahy Scale: Zero Sitting balance - Comments: Poor awareness re: LOB, absent righting reactions. Unable to follow multimodal cuing/commands for anterior weight shifting. Postural control: Posterior lean                                   Pertinent Vitals/Pain  Pain Assessment Pain Assessment: Faces Faces Pain Scale: No hurt    Home Living Family/patient expects to be discharged to:: Private residence Living Arrangements: Spouse/significant other Available Help at Discharge: Family;Available 24 hours/day Type of Home: House Home Access: Stairs to enter Entrance Stairs-Rails: Right;Can reach Software engineer of  Steps: 2   Home Layout: One level Home Equipment: Shower seat      Prior Function Prior Level of Function : Independent/Modified Independent             Mobility Comments: Pt was independent with mobility without AD, pt's husband reports pt had 2 recent falls, pt moved slowly. ADLs Comments: Husband assisted with med management & finances. Pt dressed herself but hasn't bathed in 2.5 months (used wipes instead), noting it was too difficult to sit on side of tub, but also wouldn't use shower chair.     Hand Dominance        Extremity/Trunk Assessment   Upper Extremity Assessment Upper Extremity Assessment: Overall WFL for tasks assessed;LUE deficits/detail LUE Deficits / Details: digits of LUE resting in flexed position, tight joints, would beneft from PROM/stretching    Lower Extremity Assessment Lower Extremity Assessment: Difficult to assess due to impaired cognition;Generalized weakness (pt able to move BLE in bed (RLE appears stronger than LLE), unable to follow multimodal cuing to attempt LAQ once sitting EOB)    Cervical / Trunk Assessment Cervical / Trunk Assessment:  (rounded shoulders, cervical tightness (pt resting in cervical lateral flexion to R, as well as rotated R) with therapists' attemting to perform PROM as tolerated)  Communication   Communication: No difficulties  Cognition Arousal/Alertness: Awake/alert Behavior During Therapy: Impulsive (quickly reaches for/pulls at NG tube once mittens are doffed) Overall Cognitive Status: Impaired/Different from baseline Area of Impairment: Orientation, Attention, Memory, Following commands, Safety/judgement, Awareness, Problem solving                 Orientation Level: Disoriented to, Person, Place, Time, Situation Current Attention Level: Focused Memory: Decreased short-term memory Following Commands: Follows one step commands inconsistently, Follows one step commands with increased time Safety/Judgement:  Decreased awareness of safety, Decreased awareness of deficits Awareness: Intellectual Problem Solving: Slow processing, Decreased initiation, Difficulty sequencing, Requires tactile cues, Requires verbal cues General Comments: Responds yes to most questions asked, not able to state her name but does respond to it, not able to state her age. Pt frequently reaching for/pulling at NG tube despite ongoing redirection/education.        General Comments      Exercises     Assessment/Plan    PT Assessment Patient needs continued PT services  PT Problem List Decreased strength;Decreased range of motion;Decreased coordination;Decreased cognition;Decreased activity tolerance;Decreased balance;Decreased mobility;Decreased safety awareness;Decreased knowledge of use of DME       PT Treatment Interventions DME instruction;Therapeutic exercise;Gait training;Balance training;Patient/family education;Therapeutic activities;Functional mobility training;Cognitive remediation;Stair training;Neuromuscular re-education    PT Goals (Current goals can be found in the Care Plan section)  Acute Rehab PT Goals Patient Stated Goal: get better PT Goal Formulation: With family Time For Goal Achievement: 05/10/22 Potential to Achieve Goals: Fair    Frequency 7X/week     Co-evaluation PT/OT/SLP Co-Evaluation/Treatment: Yes Reason for Co-Treatment: Complexity of the patient's impairments (multi-system involvement);Necessary to address cognition/behavior during functional activity;For patient/therapist safety;To address functional/ADL transfers PT goals addressed during session: Mobility/safety with mobility;Balance         AM-PAC PT "6 Clicks" Mobility  Outcome Measure Help needed turning from your back to your  side while in a flat bed without using bedrails?: Total Help needed moving from lying on your back to sitting on the side of a flat bed without using bedrails?: Total Help needed moving to and  from a bed to a chair (including a wheelchair)?: Total Help needed standing up from a chair using your arms (e.g., wheelchair or bedside chair)?: Total Help needed to walk in hospital room?: Total Help needed climbing 3-5 steps with a railing? : Total 6 Click Score: 6    End of Session   Activity Tolerance: Patient tolerated treatment well Patient left: in bed;with call bell/phone within reach;with bed alarm set Nurse Communication: Mobility status PT Visit Diagnosis: Unsteadiness on feet (R26.81);Difficulty in walking, not elsewhere classified (R26.2);Muscle weakness (generalized) (M62.81)    Time: 2831-5176 PT Time Calculation (min) (ACUTE ONLY): 20 min   Charges:   PT Evaluation $PT Eval High Complexity: 1 High          Lavone Nian, PT, DPT 04/26/22, 10:25 AM   Waunita Schooner 04/26/2022, 10:23 AM

## 2022-04-27 DIAGNOSIS — G9340 Encephalopathy, unspecified: Secondary | ICD-10-CM | POA: Diagnosis not present

## 2022-04-27 LAB — GLUCOSE, CAPILLARY
Glucose-Capillary: 140 mg/dL — ABNORMAL HIGH (ref 70–99)
Glucose-Capillary: 148 mg/dL — ABNORMAL HIGH (ref 70–99)
Glucose-Capillary: 171 mg/dL — ABNORMAL HIGH (ref 70–99)
Glucose-Capillary: 206 mg/dL — ABNORMAL HIGH (ref 70–99)
Glucose-Capillary: 244 mg/dL — ABNORMAL HIGH (ref 70–99)
Glucose-Capillary: 253 mg/dL — ABNORMAL HIGH (ref 70–99)
Glucose-Capillary: 257 mg/dL — ABNORMAL HIGH (ref 70–99)

## 2022-04-27 LAB — BASIC METABOLIC PANEL
Anion gap: 8 (ref 5–15)
BUN: 29 mg/dL — ABNORMAL HIGH (ref 8–23)
CO2: 26 mmol/L (ref 22–32)
Calcium: 8.6 mg/dL — ABNORMAL LOW (ref 8.9–10.3)
Chloride: 106 mmol/L (ref 98–111)
Creatinine, Ser: 1.37 mg/dL — ABNORMAL HIGH (ref 0.44–1.00)
GFR, Estimated: 42 mL/min — ABNORMAL LOW (ref >60)
Glucose, Bld: 205 mg/dL — ABNORMAL HIGH (ref 70–99)
Potassium: 4.4 mmol/L (ref 3.5–5.1)
Sodium: 140 mmol/L (ref 135–145)

## 2022-04-27 LAB — CBC
HCT: 28.3 % — ABNORMAL LOW (ref 36.0–46.0)
Hemoglobin: 9.1 g/dL — ABNORMAL LOW (ref 12.0–15.0)
MCH: 32.3 pg (ref 26.0–34.0)
MCHC: 32.2 g/dL (ref 30.0–36.0)
MCV: 100.4 fL — ABNORMAL HIGH (ref 80.0–100.0)
Platelets: 527 10*3/uL — ABNORMAL HIGH (ref 150–400)
RBC: 2.82 MIL/uL — ABNORMAL LOW (ref 3.87–5.11)
RDW: 12.9 % (ref 11.5–15.5)
WBC: 14.4 10*3/uL — ABNORMAL HIGH (ref 4.0–10.5)
nRBC: 0 % (ref 0.0–0.2)

## 2022-04-27 LAB — PROCALCITONIN: Procalcitonin: 0.37 ng/mL

## 2022-04-27 LAB — MAGNESIUM: Magnesium: 2 mg/dL (ref 1.7–2.4)

## 2022-04-27 MED ORDER — SODIUM CHLORIDE 0.9 % IV SOLN: INTRAVENOUS | Status: DC | PRN

## 2022-04-27 MED ORDER — SODIUM CHLORIDE 0.9 % IV SOLN
3.0000 g | Freq: Four times a day (QID) | INTRAVENOUS | Status: AC
  Administered 2022-04-27 – 2022-05-01 (×16): 3 g via INTRAVENOUS
  Filled 2022-04-27 (×2): qty 8
  Filled 2022-04-27 (×2): qty 3
  Filled 2022-04-27: qty 8
  Filled 2022-04-27 (×7): qty 3
  Filled 2022-04-27 (×2): qty 8
  Filled 2022-04-27 (×4): qty 3

## 2022-04-27 NOTE — Progress Notes (Addendum)
SLP Cancellation Note  Patient Details Name: Laurie Lin MRN: [id number] DOB: [birth year]   Cancelled treatment:       Reason Eval/Treat Not Completed: Fatigue/lethargy limiting ability to participate;Patient's level of consciousness;Medical issues which prohibited therapy   Attempted clinical swallowing re-evaluation. Pt's LOA prohibitive of safe oral intake. Pt unable to rouse long enough for PO trials. Additionally, noted audible, wet/gurgly respirations which is concerning for reduced secretion management. Will continue efforts as appropriate.  RN made aware of the above.  Cherrie Gauze, M.S., [facility] [phone] [place])  Quintella Baton 04/27/2022, 12:01 PM

## 2022-04-27 NOTE — Progress Notes (Signed)
PROGRESS NOTE Laurie Lin  YJE:[id number] DOB: Nov 08, 1955 DOA: 04/17/2022 PCP: Pcp, No   Brief Narrative/Hospital Course: 67yof w/ diabetes mellitus type 1, stage IIIa chronic kidney disease, hypertension and ongoing alcohol use who was brought in for worsening confusion for the last month.  Workup revealed urinary tract infection, acute kidney injury, hypomagnesemia and hyperkalemia.  MRI unremarkable for CVA. On evening of 12/30, patient started becoming slightly more agitated requiring Ativan.  As 12/31 progressed, patient became even more agitated despite Ativan.  Discussed with critical care.  Patient transferred to critical care service to the ICU and started on Precedex drip.  With worsening agitation, patient intubated on 1/1.  By 1/3, able to be weaned off of Precedex and able to be extubated on 1/4.  Patient also with Klebsiella UTI, treated with course of Rocephin.  [facility] assumed care 1/5 and patient started on IV antibiotics for aspiration pneumonia.  Mentation waxing and waning and unable to clear swallow evaluation.  Patient currently on tube feeds.further discussion was had w/ family and proceeding with PEG tube placement     Subjective: Seen and examined Overnight afebrile, labs shows mild leukocytosis 14.4 up from 12.2.,  Renal function stable at 1.3 PT working with her reported patient is following somewhat more one-step commands Alert awake NG tube in place not able to hold much conversation. Rectal tube in place.   Assessment and Plan: Principal Problem:   Encephalopathy acute Active Problems:   Aspiration pneumonia (HCC)   Acute renal failure superimposed on stage 3a chronic kidney disease (HCC)   Type 1 diabetes mellitus with hyperglycemia (HCC)   Alcohol abuse   Essential hypertension   GERD without esophagitis   Overweight (BMI 25.0-29.9)   Acute encephalopathy Alcohol abuse with withdrawal and delirium tremens: Acute encephalopathy  multifactorial suspecting metabolic also in the setting of chronic alcohol abuse, due to critical illness.MRI ruled out CVA, but does note significant wear and tear disproportionate for patient's age. She had decompensated requiring Precedex drip and mechanical intubation> subsequently weaned off Precedex and extubated 1/4. Now more appropriate still with weak and confusion answer some questions, participating with PT OT saw him.PT and OT recommending CIR, but patient not felt to be good candidate for CIR given waxing waning mental status, so would need skilled nursing. At this time, it is unclear how much of her mentation issues are going to be long-term.  By treating underlying infection, taking out of ICU and minimizing delirium, monitoring for over sedating medications and improving nutrition, hopefully she will improve. Previous MD discussed with the patient's husband and he understands.  If patient not improved, husband is interested in a long-term care facility.  Aspiration pneumonia: Patient on empiric antibiotics with cefepime currently> change to unasyn.  Currently has mild leukocytosis Pro-Cal is 0.37.  She is doing well on room air.  Previously she had low-grade temperature along with leukocytosis and increased procalcitonin chest x-ray within infiltrate suspicious for aspiration pneumonia  AKI on CKD stage IIIa, improved, off IV fluids continue tube feeding  Recent Labs  Lab 04/24/22 1227 04/25/22 0453 04/25/22 0936 04/26/22 0659 04/27/22 0402  BUN 35* 33* 31* 26* 29*  CREATININE 1.55* 1.45* 1.35* 1.27* 1.37*    Alcohol abuse with withdrawal:patient was drinking 7-8 beers per day and has not had any alcohol in at least several days before coming in.  Patient is through withdrawal continue supportive care thiamine vitamins   Type 1 diabetes mellitus with uncontrolled hyperglycemia: Blood sugar fairly stable.  Seen by diabetes coordinator.  On Semglee 12 units, also on Reglan Recent  Labs  Lab 04/26/22 2013 04/27/22 0007 04/27/22 0143 04/27/22 0439 04/27/22 0825  GLUCAP 273* 148* 140* 244* 206*    Essential hypertension: BP stable.  On scheduled IV Lopressor given heart rate staying in the 90s and blood pressures ranging from the 130s to 170s.Once PEG tube in place, can change to p.o. beta-blocker  GERD continue PPI   FEN: Patient with poor oral intake, at this time proceeding with feeding tube placement based on previous discussion continue with ongoing supportive care  Bronchitis-resolved as of 04/25/2022 Completed course of antibiotics to Rocephin and Zithromax   UTI due to Klebsiella species-resolved as of 04/25/2022 Urine cultures growing out pansensitive Klebsiella.  Completed course of Rocephin   Acute respiratory failure with hypoxia (HCC)-resolved as of 04/25/2022 Secondary to agitation.  Required mechanical intubation.   Hypernatremia-resolved as of 04/25/2022 Secondary to lack of p.o. intake while she is confused.  Change IV fluids to half-normal saline.  DVT prophylaxis: enoxaparin (LOVENOX) injection 40 mg Start: 04/19/22 1000 Code Status:   Code Status: Full Code Family Communication: plan of care discussed with patient/no family  at bedside. Patient status is: inpatient  because of ongoing altered mental status Level of care: Telemetry Medical   Dispo: The patient is from: HOME             Anticipated disposition: snf Objective: Vitals last 24 hrs: Vitals:   04/27/22 0145 04/27/22 0500 04/27/22 0610 04/27/22 0821  BP: (!) 150/71  (!) 144/66 138/72  Pulse: 84  91 93  Resp: 18  16 20   Temp: 98 F (36.7 C)  98.9 F (37.2 C) 98 F (36.7 C)  TempSrc:      SpO2: 96%  96% 97%  Weight:  87.7 kg    Height:       Weight change: -0.7 kg  Physical Examination: General exam: alert awake, appears older than stated age, confused HEENT:Oral mucosa moist, Ear/Nose WNL grossly Respiratory system: bilaterally clear BS, NGT+ no use of accessory  muscle Cardiovascular system: S1 & S2 +, No JVD. Gastrointestinal system: Abdomen soft,NT,ND, BS+ Nervous System:Alert, awake, interactive some, she is moving extremities, follows one-step,. Extremities: LE edema NEG ,distal peripheral pulses palpable.  Skin: No rashes,no icterus. MSK: Normal muscle bulk,tone, power  Medications reviewed:  Scheduled Meds:  Chlorhexidine Gluconate Cloth  6 each Topical Daily   enoxaparin (LOVENOX) injection  40 mg Subcutaneous Q24H   feeding supplement (PROSource TF20)  60 mL Per Tube Daily   folic acid  1 mg Intravenous Daily   free water  100 mL Per Tube Q4H   insulin aspart  0-20 Units Subcutaneous Q4H   insulin glargine-yfgn  12 Units Subcutaneous Daily   metoCLOPramide (REGLAN) injection  5 mg Intravenous Q8H   metoprolol tartrate  5 mg Intravenous Q6H   mouth rinse  15 mL Mouth Rinse 4 times per day   thiamine (VITAMIN B1) injection  100 mg Intravenous Daily   Continuous Infusions:  sodium chloride 10 mL/hr at 04/27/22 0455   ceFEPime (MAXIPIME) IV 2 g (04/27/22 0458)   feeding supplement (OSMOLITE 1.5 CAL) 60 mL/hr at 04/27/22 0000      Diet Order             Diet NPO time specified  Diet effective now                    Nutrition Problem: Inadequate oral  intake Etiology: inability to eat (pt sedated and ventilated) Signs/Symptoms: NPO status     Intake/Output Summary (Last 24 hours) at 04/27/2022 1131 Last data filed at 04/27/2022 0615 Gross per 24 hour  Intake 1236.95 ml  Output 1060 ml  Net 176.95 ml   Net IO Since Admission: 15,335.29 mL [04/27/22 1131]  Wt Readings from Last 3 Encounters:  04/27/22 87.7 kg  08/22/20 92.5 kg  10/28/19 83.9 kg     Unresulted Labs (From admission, onward)    None     Data Reviewed: I have personally reviewed following labs and imaging studies CBC: Recent Labs  Lab 04/23/22 0406 04/24/22 0529 04/25/22 0453 04/26/22 0659 04/27/22 0402  WBC 10.5 9.8 13.8* 12.2* 14.4*  HGB  10.4* 8.9* 9.4* 10.4* 9.1*  HCT 31.3* 27.9* 28.3* 31.4* 28.3*  MCV 100.6* 102.6* 99.0 98.7 100.4*  PLT 341 386 452* 518* 389*   Basic Metabolic Panel: Recent Labs  Lab 04/22/22 0415 04/23/22 0406 04/23/22 1435 04/24/22 0529 04/24/22 1227 04/25/22 0453 04/25/22 0936 04/26/22 0659 04/27/22 0402  NA 141 134*   < > 138 138 142 140 142 140  K 3.3* 2.8*   < > 3.4* 3.8 3.8 3.7 3.9 4.4  CL 110 108   < > 114* 110 112* 111 107 106  CO2 19* 16*   < > 18* 18* 23 23 25 26   GLUCOSE 138* 165*   < > 163* 221* 127* 127* 211* 205*  BUN 28* 25*   < > 33* 35* 33* 31* 26* 29*  CREATININE 1.63* 1.17*   < > 1.60* 1.55* 1.45* 1.35* 1.27* 1.37*  CALCIUM 7.9* 8.0*   < > 8.0* 8.4* 8.2* 8.2* 8.8* 8.6*  MG 2.1 2.0  --  1.8  --  1.6*  --  1.8 2.0  PHOS 2.7 2.3*  --  2.2*  --  3.6  --  2.5  --    < > = values in this interval not displayed.   GFR: Estimated Creatinine Clearance: 47.9 mL/min (A) (by C-G formula based on SCr of 1.37 mg/dL (H)). Liver Function Tests: Recent Labs  Lab 04/24/22 0529  AST 18  ALT 9  ALKPHOS 87  BILITOT 0.6  PROT 5.9*  ALBUMIN 2.1*   No results for input(s): "LIPASE", "AMYLASE" in the last 168 hours. Recent Labs  Lab 04/22/22 0848  AMMONIA 38*  CBG: Recent Labs  Lab 04/26/22 2013 04/27/22 0007 04/27/22 0143 04/27/22 0439 04/27/22 0825  GLUCAP 273* 148* 140* 244* 206*    Recent Labs  Lab 04/25/22 0936 04/26/22 0659 04/27/22 0402  PROCALCITON 0.67 0.44 0.37    Recent Results (from the past 240 hour(s))  Urine Culture     Status: Abnormal   Collection Time: 04/18/22  2:00 PM   Specimen: Urine, Clean Catch  Result Value Ref Range Status   Specimen Description   Final    URINE, CLEAN CATCH Performed at [facility], [address], [place], [place] [address]    Special Requests   Final    NONE Performed at [facility], [address] [place], [place] [address]    Culture >=100,000 COLONIES/mL KLEBSIELLA PNEUMONIAE (A)  Final    Report Status 04/20/2022 FINAL  Final   Organism ID, Bacteria KLEBSIELLA PNEUMONIAE (A)  Final      Susceptibility   Klebsiella pneumoniae - MIC*    AMPICILLIN RESISTANT Resistant     CEFAZOLIN <=4 SENSITIVE Sensitive     CEFEPIME <=0.12 SENSITIVE  Sensitive     CEFTRIAXONE <=0.25 SENSITIVE Sensitive     CIPROFLOXACIN <=0.25 SENSITIVE Sensitive     GENTAMICIN <=1 SENSITIVE Sensitive     IMIPENEM <=0.25 SENSITIVE Sensitive     NITROFURANTOIN <=16 SENSITIVE Sensitive     TRIMETH/SULFA <=20 SENSITIVE Sensitive     AMPICILLIN/SULBACTAM <=2 SENSITIVE Sensitive     PIP/TAZO <=4 SENSITIVE Sensitive     * >=100,000 COLONIES/mL KLEBSIELLA PNEUMONIAE  MRSA Next Gen by PCR, Nasal     Status: None   Collection Time: 04/20/22  4:52 PM   Specimen: Nasal Mucosa; Nasal Swab  Result Value Ref Range Status   MRSA by PCR Next Gen NOT DETECTED NOT DETECTED Final    Comment: (NOTE) The GeneXpert MRSA Assay (FDA approved for NASAL specimens only), is one component of a comprehensive MRSA colonization surveillance program. It is not intended to diagnose MRSA infection nor to guide or monitor treatment for MRSA infections. Test performance is not FDA approved in patients less than 47 years old. Performed at [facility], [address] [place], [place] [address]   Culture, blood (Routine X 2) w Reflex to ID Panel     Status: None   Collection Time: 04/20/22  5:25 PM   Specimen: BLOOD  Result Value Ref Range Status   Specimen Description BLOOD BLOOD LEFT ARM  Final   Special Requests   Final    BOTTLES DRAWN AEROBIC AND ANAEROBIC Blood Culture adequate volume   Culture   Final    NO GROWTH 5 DAYS Performed at [facility], [address] [place], [place] [address]    Report Status 04/25/2022 FINAL  Final  Culture, blood (Routine X 2) w Reflex to ID Panel     Status: None   Collection Time: 04/20/22  5:33 PM   Specimen: BLOOD  Result Value Ref Range Status   Specimen  Description BLOOD BLOOD RIGHT HAND  Final   Special Requests   Final    BOTTLES DRAWN AEROBIC AND ANAEROBIC Blood Culture adequate volume   Culture   Final    NO GROWTH 5 DAYS Performed at [facility], [address] [place], [place] [address]    Report Status 04/25/2022 FINAL  Final  Resp panel by RT-PCR (RSV, Flu A&B, Covid) Anterior Nasal Swab     Status: None   Collection Time: 04/21/22 10:22 PM   Specimen: Anterior Nasal Swab  Result Value Ref Range Status   SARS Coronavirus 2 by RT PCR NEGATIVE NEGATIVE Final    Comment: (NOTE) SARS-CoV-2 target nucleic acids are NOT DETECTED.  The SARS-CoV-2 RNA is generally detectable in upper respiratory specimens during the acute phase of infection. The lowest concentration of SARS-CoV-2 viral copies this assay can detect is 138 copies/mL. A negative result does not preclude SARS-Cov-2 infection and should not be used as the sole basis for treatment or other patient management decisions. A negative result may occur with  improper specimen collection/handling, submission of specimen other than nasopharyngeal swab, presence of viral mutation(s) within the areas targeted by this assay, and inadequate number of viral copies(<138 copies/mL). A negative result must be combined with clinical observations, patient history, and epidemiological information. The expected result is Negative.  Fact Sheet for Patients:  EntrepreneurPulse.com.au  Fact Sheet for Healthcare Providers:  IncredibleEmployment.be  This test is no t yet approved or cleared by the [place] FDA and  has been authorized for detection and/or diagnosis of SARS-CoV-2 by FDA under an Emergency  Use Authorization (EUA). This EUA will remain  in effect (meaning this test can be used) for the duration of the COVID-19 declaration under Section 564(b)(1) of the Act, 21 U.S.C.section 360bbb-3(b)(1), unless the authorization is  terminated  or revoked sooner.       Influenza A by PCR NEGATIVE NEGATIVE Final   Influenza B by PCR NEGATIVE NEGATIVE Final    Comment: (NOTE) The Xpert Xpress SARS-CoV-2/FLU/RSV plus assay is intended as an aid in the diagnosis of influenza from Nasopharyngeal swab specimens and should not be used as a sole basis for treatment. Nasal washings and aspirates are unacceptable for Xpert Xpress SARS-CoV-2/FLU/RSV testing.  Fact Sheet for Patients: EntrepreneurPulse.com.au  Fact Sheet for Healthcare Providers: IncredibleEmployment.be  This test is not yet approved or cleared by the [place] FDA and has been authorized for detection and/or diagnosis of SARS-CoV-2 by FDA under an Emergency Use Authorization (EUA). This EUA will remain in effect (meaning this test can be used) for the duration of the COVID-19 declaration under Section 564(b)(1) of the Act, 21 U.S.C. section 360bbb-3(b)(1), unless the authorization is terminated or revoked.     Resp Syncytial Virus by PCR NEGATIVE NEGATIVE Final    Comment: (NOTE) Fact Sheet for Patients: EntrepreneurPulse.com.au  Fact Sheet for Healthcare Providers: IncredibleEmployment.be  This test is not yet approved or cleared by the [place] FDA and has been authorized for detection and/or diagnosis of SARS-CoV-2 by FDA under an Emergency Use Authorization (EUA). This EUA will remain in effect (meaning this test can be used) for the duration of the COVID-19 declaration under Section 564(b)(1) of the Act, 21 U.S.C. section 360bbb-3(b)(1), unless the authorization is terminated or revoked.  Performed at [facility], [address] [place], [place] [address]   Culture, blood (Routine X 2) w Reflex to ID Panel     Status: None (Preliminary result)   Collection Time: 04/23/22  3:20 PM   Specimen: BLOOD  Result Value Ref Range Status   Specimen  Description BLOOD  Final   Special Requests BOTTLES DRAWN AEROBIC AND ANAEROBIC BCLV  Final   Culture   Final    NO GROWTH 4 DAYS Performed at [facility], [address] [place], [place] [address]    Report Status PENDING  Incomplete  Culture, blood (Routine X 2) w Reflex to ID Panel     Status: None (Preliminary result)   Collection Time: 04/23/22  3:23 PM   Specimen: BLOOD  Result Value Ref Range Status   Specimen Description BLOOD BLOOD LEFT HAND  Final   Special Requests BOTTLES DRAWN AEROBIC AND ANAEROBIC BCAV  Final   Culture   Final    NO GROWTH 4 DAYS Performed at [facility], [address], [place], [place] [address]    Report Status PENDING  Incomplete  Culture, Respiratory w Gram Stain     Status: None   Collection Time: 04/23/22  3:50 PM  Result Value Ref Range Status   Specimen Description   Final    EXPECTORATED SPUTUM Performed at [facility], [address], [place], [place] [address]    Special Requests   Final    NONE Performed at [facility], [address], [place], [place] [address]    Gram Stain   Final    MODERATE WBC PRESENT, PREDOMINANTLY PMN NO ORGANISMS SEEN    Culture   Final    Normal respiratory flora-no Staph aureus or Pseudomonas seen Performed at [facility], [place] [address] [place], [place] [address]    Report Status 04/26/2022 FINAL  Final    Antimicrobials: Anti-infectives (From admission, onward)    Start     Dose/Rate Route Frequency Ordered Stop   04/25/22 1515  ceFEPIme (MAXIPIME) 2 g in sodium chloride 0.9 % 100 mL IVPB        2 g 200 mL/hr over 30 Minutes Intravenous Every 12 hours 04/25/22 1418     04/19/22 1815  cefTRIAXone (ROCEPHIN) 1 g in sodium chloride 0.9 % 100 mL IVPB        1 g 200 mL/hr over 30 Minutes Intravenous Every 24 hours 04/19/22 1715 04/23/22 1900   04/19/22 1815  azithromycin (ZITHROMAX) 500 mg in sodium chloride 0.9 % 250 mL IVPB  Status:  Discontinued         500 mg 250 mL/hr over 60 Minutes Intravenous Every 24 hours 04/19/22 1715 04/23/22 1434   04/18/22 1500  doxycycline (VIBRAMYCIN) 100 mg in sodium chloride 0.9 % 250 mL IVPB  Status:  Discontinued        100 mg 125 mL/hr over 120 Minutes Intravenous Every 12 hours 04/18/22 1344 04/19/22 1715      Culture/Microbiology    Component Value Date/Time   SDES  04/23/2022 1550    EXPECTORATED SPUTUM Performed at [facility], [address] [place], [place] [address]    [facility]  04/23/2022 1550    NONE Performed at [facility], [address] [place], [place] [address]    CULT  04/23/2022 1550    Normal respiratory flora-no Staph aureus or Pseudomonas seen Performed at [facility] [address], [place], [place] [address]    REPTSTATUS 04/26/2022 FINAL 04/23/2022 1550  Radiology Studies: DG Abd 1 View  Result Date: 04/26/2022 CLINICAL DATA:  Nasogastric tube placement. EXAM: ABDOMEN - 1 VIEW COMPARISON:  April 22, 2022 FINDINGS: A feeding tube is seen with its distal tip overlying the expected region of the gastric antrum. The bowel gas pattern is normal. No radio-opaque calculi or other significant radiographic abnormality are seen. IMPRESSION: Feeding tube positioning, as described above. Electronically Signed   By: Virgina Norfolk M.D.   On: 04/26/2022 22:43   DG Chest Port 1 View  Result Date: 04/25/2022 CLINICAL DATA:  Pneumonia EXAM: PORTABLE CHEST 1 VIEW COMPARISON:  04/23/2022 FINDINGS: The endotracheal tube is no longer present. Feeding tube coiled in the stomach. Airway thickening is present, suggesting bronchitis or reactive airways disease. New right perihilar airspace opacity suspicious for early pneumonia. Left lung appears clear. Hazy accentuated density at the right lung base with some obscuration of the right lateral hemidiaphragm, potentially from atelectasis or pneumonia. Heart size within normal limits for portable technique.  IMPRESSION: 1. New right perihilar airspace opacity suspicious for early pneumonia. 2. Hazy accentuated density at the right lung base with obscuration of the right lateral hemidiaphragm, potentially from atelectasis or pneumonia. 3. Airway thickening is present, suggesting bronchitis or reactive airways disease. Electronically Signed   By: Van Clines M.D.   On: 04/25/2022 11:58     LOS: 9 days   Antonieta Pert, MD [facility]  04/27/2022, 11:31 AM

## 2022-04-27 NOTE — Progress Notes (Signed)
I spoke with the pt's spouse, Gretel Cantu via telephone call because the pt's sister, as a visitor in the room, was upset that the pt's nurse would not share any information with them due to their inability to give the RN the pt's password; Mr. Dobbin advised me that he did not create a password and his wife is not able to create a password (04/27/22 assessment indicates pt alert and oriented to self); [facility] documentation indicates a password was documented on 04/20/22 at 1824 by Myra Flowers RN "[email or web address]"; [facility] records do not have scanned copies of any Advanced Directives nor are there any paper copies of any Advanced Directives in the paper chart; the spouse verbalized that he is her power of attorney and she is his power of attorney; advised Mr. Lundahl that if he has Advanced Directives at the house that include [place] to bring them in and copies can be made and placed in her chart (they would be scanned in by Medical Records for future reference).  The password can then be changed to something the [place] wants to create.  The spouse verbalized that the doctor has been talking to him and not asking for a password.  I advised him that nursing care is following HIPAA guidelines and we can only control what the doctors share with patients and those that may be in the room visiting.

## 2022-04-27 NOTE — Progress Notes (Signed)
Spouse called inquiring on time of discharge tomorrow, 04/28/2022.  Patient informed no visual discharge plane per recent notes and encouraged patient to call back in morning of 04/28/2022.

## 2022-04-27 NOTE — Progress Notes (Signed)
Physical Therapy Treatment Patient Details Name: Laurie Lin MRN: [id number] DOB: Aug 18, 1955 Today's Date: 04/27/2022   History of Present Illness Pt is a 67 y/o F admitted on 04/17/22 after presenting with c/o worsening confusion for the last month. Work up revealed UTI, AKI, hypomagnesemia, hyperkalemia. During admission pt became more agitated & intubated on 1/1, extubated 1/4. PMH: DM1, CKD 3A, HTN, alcohol use    PT Comments    The pt presents this session alert for PT. She intermittently follows one step commands but requires increased time for processing. The pt demonstrates improved static sitting balance at the EOB with intermittent need for assistance. The pt demonstrated ability to initiate and engage in sit<>stand training, however was unable to achieve full upright positioning. The pt continues to be a great candidate for post acute inpatient rehabilitation. PT will continue to follow.    Recommendations for follow up therapy are one component of a multi-disciplinary discharge planning process, led by the attending physician.  Recommendations may be updated based on patient status, additional functional criteria and insurance authorization.  Follow Up Recommendations  Acute inpatient rehab (3hours/day)     Assistance Recommended at Discharge Frequent or constant Supervision/Assistance  Patient can return home with the following Two people to help with walking and/or transfers;Direct supervision/assist for medications management;Two people to help with bathing/dressing/bathroom;Help with stairs or ramp for entrance;Assist for transportation;Assistance with feeding;Direct supervision/assist for financial management;Assistance with cooking/housework   Equipment Recommendations  None recommended by PT    Recommendations for Other Services Rehab consult     Precautions / Restrictions Precautions Precautions: Fall Precaution Comments: NG, foley, rectal tube;  mittens Restrictions Weight Bearing Restrictions: No     Mobility  Bed Mobility Overal bed mobility: Needs Assistance Bed Mobility: Supine to Sit, Sit to Supine, Rolling Rolling: Total assist   Supine to sit: Total assist, +2 for physical assistance Sit to supine: Total assist, +2 for physical assistance        Transfers Overall transfer level: Needs assistance Equipment used: 2 person hand held assist Transfers: Sit to/from Stand Sit to Stand: Mod assist, +2 physical assistance, From elevated surface           General transfer comment: Pt with x2 standing attempts; first attempt Mod A+2, pt demonstrating initiation and activation for sit<>stand; no initiation noted on 2nd attempt.    Ambulation/Gait                   Stairs             Wheelchair Mobility    Modified Rankin (Stroke Patients Only)       Balance Overall balance assessment: Needs assistance Sitting-balance support: Feet supported, Bilateral upper extremity supported Sitting balance-Leahy Scale: Poor Sitting balance - Comments: Poor awareness re: LOB, righting reactions noted intermittently. Unable to follow multimodal cuing/commands for anterior weight shifting. Postural control: Posterior lean, Right lateral lean   Standing balance-Leahy Scale: Zero                              Cognition Arousal/Alertness: Awake/alert Behavior During Therapy: Restless Overall Cognitive Status: Impaired/Different from baseline                                          Exercises Other Exercises Other Exercises: Pt demonstrating decreased attention to the L  side even with verbal and tactile cueing. Question inattention vs hearing    General Comments        Pertinent Vitals/Pain Pain Assessment Pain Assessment: Faces Faces Pain Scale: Hurts a little bit    Home Living                          Prior Function            PT Goals (current goals  can now be found in the care plan section) Acute Rehab PT Goals Patient Stated Goal: get better PT Goal Formulation: With family Time For Goal Achievement: 05/10/22 Potential to Achieve Goals: Fair Progress towards PT goals: Progressing toward goals    Frequency    7X/week      PT Plan Current plan remains appropriate    Co-evaluation              AM-PAC PT "6 Clicks" Mobility   Outcome Measure  Help needed turning from your back to your side while in a flat bed without using bedrails?: Total Help needed moving from lying on your back to sitting on the side of a flat bed without using bedrails?: Total Help needed moving to and from a bed to a chair (including a wheelchair)?: Total Help needed standing up from a chair using your arms (e.g., wheelchair or bedside chair)?: A Lot Help needed to walk in hospital room?: Total Help needed climbing 3-5 steps with a railing? : Total 6 Click Score: 7    End of Session   Activity Tolerance: Patient tolerated treatment well Patient left: in bed;with call bell/phone within reach (Mittens donned, all lines and tubes intact. Bed lowered and railing in place.) Nurse Communication: Mobility status PT Visit Diagnosis: Unsteadiness on feet (R26.81);Difficulty in walking, not elsewhere classified (R26.2);Muscle weakness (generalized) (M62.81)     Time: 4196-2229 PT Time Calculation (min) (ACUTE ONLY): 29 min  Charges:  $Therapeutic Activity: 23-37 mins                     10:05 AM, 04/27/22 Elizar Alpern A. Saverio Danker PT, DPT Physical Therapist - [facility]    Isrrael Fluckiger A Trevaris Pennella 04/27/2022, 10:03 AM

## 2022-04-28 DIAGNOSIS — G9340 Encephalopathy, unspecified: Secondary | ICD-10-CM | POA: Diagnosis not present

## 2022-04-28 LAB — CBC
HCT: 28.9 % — ABNORMAL LOW (ref 36.0–46.0)
Hemoglobin: 9.4 g/dL — ABNORMAL LOW (ref 12.0–15.0)
MCH: 32.6 pg (ref 26.0–34.0)
MCHC: 32.5 g/dL (ref 30.0–36.0)
MCV: 100.3 fL — ABNORMAL HIGH (ref 80.0–100.0)
Platelets: 574 10*3/uL — ABNORMAL HIGH (ref 150–400)
RBC: 2.88 MIL/uL — ABNORMAL LOW (ref 3.87–5.11)
RDW: 13.1 % (ref 11.5–15.5)
WBC: 13.7 10*3/uL — ABNORMAL HIGH (ref 4.0–10.5)
nRBC: 0 % (ref 0.0–0.2)

## 2022-04-28 LAB — GLUCOSE, CAPILLARY
Glucose-Capillary: 118 mg/dL — ABNORMAL HIGH (ref 70–99)
Glucose-Capillary: 131 mg/dL — ABNORMAL HIGH (ref 70–99)
Glucose-Capillary: 205 mg/dL — ABNORMAL HIGH (ref 70–99)
Glucose-Capillary: 206 mg/dL — ABNORMAL HIGH (ref 70–99)
Glucose-Capillary: 291 mg/dL — ABNORMAL HIGH (ref 70–99)
Glucose-Capillary: 329 mg/dL — ABNORMAL HIGH (ref 70–99)
Glucose-Capillary: 66 mg/dL — ABNORMAL LOW (ref 70–99)
Glucose-Capillary: 86 mg/dL (ref 70–99)

## 2022-04-28 LAB — CULTURE, BLOOD (ROUTINE X 2)
Culture: NO GROWTH
Culture: NO GROWTH

## 2022-04-28 LAB — BASIC METABOLIC PANEL
Anion gap: 10 (ref 5–15)
BUN: 33 mg/dL — ABNORMAL HIGH (ref 8–23)
CO2: 26 mmol/L (ref 22–32)
Calcium: 8.7 mg/dL — ABNORMAL LOW (ref 8.9–10.3)
Chloride: 103 mmol/L (ref 98–111)
Creatinine, Ser: 1.53 mg/dL — ABNORMAL HIGH (ref 0.44–1.00)
GFR, Estimated: 37 mL/min — ABNORMAL LOW (ref >60)
Glucose, Bld: 298 mg/dL — ABNORMAL HIGH (ref 70–99)
Potassium: 4.8 mmol/L (ref 3.5–5.1)
Sodium: 139 mmol/L (ref 135–145)

## 2022-04-28 MED ORDER — DEXTROSE 50 % IV SOLN
INTRAVENOUS | Status: AC
  Administered 2022-04-28: 50 mL via INTRAVENOUS
  Filled 2022-04-28: qty 50

## 2022-04-28 MED ORDER — CEFAZOLIN SODIUM-DEXTROSE 2-4 GM/100ML-% IV SOLN
2.0000 g | INTRAVENOUS | Status: AC
  Administered 2022-04-29: 2 g via INTRAVENOUS
  Filled 2022-04-28: qty 100

## 2022-04-28 MED ORDER — INSULIN ASPART 100 UNIT/ML IJ SOLN
7.0000 [IU] | INTRAMUSCULAR | Status: DC
  Administered 2022-04-28: 7 [IU] via SUBCUTANEOUS
  Filled 2022-04-28 (×2): qty 1

## 2022-04-28 MED ORDER — DEXTROSE 5 % IV SOLN: INTRAVENOUS | Status: DC

## 2022-04-28 MED ORDER — DEXTROSE 50 % IV SOLN: 1.0000 | Freq: Once | INTRAVENOUS | Status: AC

## 2022-04-28 MED ORDER — DIPHENHYDRAMINE HCL 50 MG/ML IJ SOLN
12.5000 mg | Freq: Once | INTRAMUSCULAR | Status: AC
  Administered 2022-04-28: 12.5 mg via INTRAVENOUS
  Filled 2022-04-28: qty 1

## 2022-04-28 MED ORDER — FREE WATER: 200.0000 mL | Status: DC

## 2022-04-28 NOTE — Progress Notes (Signed)
Occupational Therapy Treatment Patient Details Name: Laurie Lin MRN: [id number] DOB: Apr 21, 1956 Today's Date: 04/28/2022   History of present illness Pt is a 67 y/o F admitted on 04/17/22 after presenting with c/o worsening confusion for the last month. Work up revealed UTI, AKI, hypomagnesemia, hyperkalemia. During admission pt became more agitated & intubated on 1/1, extubated 1/4. PMH: DM1, CKD 3A, HTN, alcohol use   OT comments  Pt seen for skilled co-tx with PT. Pt is nonverbal throughout session and is able to follow only 1 command during session. Pt is noted to have on soiled hospital gown and needing total A to change and +2 to roll L <> R for linen change. Use of oral suction and swab for oral care with pt not following commands to open mouth to allow for oral care. Pt then begins vomiting in room multiple time. She holds vomit in mouth and appear to be attempting to swallow it. RN arrives to room to further assess pt. Recommendation changed from inpt rehab to SNF based on pt's current abilities and tolerance.    Recommendations for follow up therapy are one component of a multi-disciplinary discharge planning process, led by the attending physician.  Recommendations may be updated based on patient status, additional functional criteria and insurance authorization.    Follow Up Recommendations  Skilled nursing-short term rehab (<3 hours/day)     Assistance Recommended at Discharge Frequent or constant Supervision/Assistance  Patient can return home with the following  Two people to help with walking and/or transfers;Two people to help with bathing/dressing/bathroom   Equipment Recommendations  Other (comment) (defer to next venue of care)       Precautions / Restrictions Precautions Precautions: Fall Precaution Comments: NG, foley, rectal tube; mittens Restrictions Weight Bearing Restrictions: No       Mobility Bed Mobility Overal bed mobility: Needs Assistance Bed  Mobility: Rolling Rolling: Total assist, +2 for physical assistance, +2 for safety/equipment         General bed mobility comments: totalA+2 to roll L/R for linen change with patient resisting any attempts moving. unsafe to attempt EOB this date    Transfers                   General transfer comment: not attempted secondary to safety concerns         ADL either performed or assessed with clinical judgement   ADL Overall ADL's : Needs assistance/impaired                                       General ADL Comments: total A fof 2 to change hospital gown, roll in bed, and oral care with suction    Extremity/Trunk Assessment Upper Extremity Assessment Upper Extremity Assessment: Difficult to assess due to impaired cognition            Vision Patient Visual Report: No change from baseline            Cognition Arousal/Alertness: Awake/alert Behavior During Therapy: Restless, Flat affect Overall Cognitive Status: Difficult to assess                                 General Comments: Seemed to follow 1 command during session otherwise not following commands. No attempts at verbalization this session  General Comments Held tube feeds at beginning of session. Performed oral care on patient with HOB elevated. RN present as patient vomitted brown emesis. MD aware by RN    Pertinent Vitals/ Pain       Pain Assessment Pain Assessment: Faces Faces Pain Scale: No hurt         Frequency  Min 2X/week        Progress Toward Goals  OT Goals(current goals can now be found in the care plan section)  Progress towards OT goals: Progressing toward goals  Acute Rehab OT Goals OT Goal Formulation: Patient unable to participate in goal setting  Plan Discharge plan needs to be updated;Frequency needs to be updated    Co-evaluation    PT/OT/SLP Co-Evaluation/Treatment: Yes Reason for Co-Treatment: For patient/therapist  safety;To address functional/ADL transfers;Necessary to address cognition/behavior during functional activity PT goals addressed during session: Mobility/safety with mobility OT goals addressed during session: ADL's and self-care      AM-PAC OT "6 Clicks" Daily Activity     Outcome Measure   Help from another person eating meals?: Total Help from another person taking care of personal grooming?: Total Help from another person toileting, which includes using toliet, bedpan, or urinal?: Total Help from another person bathing (including washing, rinsing, drying)?: Total Help from another person to put on and taking off regular upper body clothing?: Total Help from another person to put on and taking off regular lower body clothing?: Total 6 Click Score: 6    End of Session    OT Visit Diagnosis: Other abnormalities of gait and mobility (R26.89)   Activity Tolerance Treatment limited secondary to medical complications (Comment)   Patient Left in bed;with call bell/phone within reach;with bed alarm set   Nurse Communication Mobility status        Time: 5953-9672 OT Time Calculation (min): 27 min  Charges: OT General Charges $OT Visit: 1 Visit OT Treatments $Self Care/Home Management : 8-22 mins  Darleen Crocker, MS, OTR/L , CBIS ascom [phone]  04/28/22, 1:13 PM

## 2022-04-28 NOTE — Inpatient Diabetes Management (Signed)
Inpatient Diabetes Program Recommendations  AACE/ADA: New Consensus Statement on Inpatient Glycemic Control (2015)  Target Ranges:  Prepandial:   less than 140 mg/dL      Peak postprandial:   less than 180 mg/dL (1-2 hours)      Critically ill patients:  140 - 180 mg/dL   Lab Results  Component Value Date   GLUCAP 329 (H) 04/28/2022   HGBA1C 7.2 (H) 04/18/2022    Review of Glycemic Control  Latest Reference Range & Units 04/27/22 19:58 04/28/22 00:22 04/28/22 04:44 04/28/22 09:02  Glucose-Capillary 70 - 99 mg/dL 171 (H) 206 (H) 205 (H) 329 (H)  (H): Data is abnormally high Diabetes history: DM1 (does not make any insulin; requires basal, correction, and carbohydrate coverage insulin) Outpatient Diabetes medications: Basaglar 12 units QHS, Novolog 0-9 units TID with meals  Current orders for Inpatient glycemic control: Semglee 12 units daily, Novolog 0-20 units Q4H Osmolite @ 60 ml/hr  Inpatient Diabetes Program Recommendations:    Consider adding Novolog 7 units Q4H for tube feed coverage (to be stopped or held in the event tube feeds stopped).   Thanks, Bronson Curb, MSN, RNC-OB Diabetes Coordinator [phone] (8a-5p)

## 2022-04-28 NOTE — Progress Notes (Signed)
PROGRESS NOTE Laurie Lin  QIW:[id number] DOB: 12/12/1955 DOA: 04/17/2022 PCP: Pcp, No   Brief Narrative/Hospital Course: 7yof w/ diabetes mellitus type 1, stage IIIa chronic kidney disease, hypertension and ongoing alcohol use who was brought in for worsening confusion for the last month.  Workup revealed urinary tract infection, acute kidney injury, hypomagnesemia and hyperkalemia.  MRI unremarkable for CVA. On evening of 12/30, patient started becoming slightly more agitated requiring Ativan.  As 12/31 progressed, patient became even more agitated despite Ativan.  Discussed with critical care.  Patient transferred to critical care service to the ICU and started on Precedex drip.  With worsening agitation, patient intubated on 1/1.  By 1/3, able to be weaned off of Precedex and able to be extubated on 1/4.  Patient also with Klebsiella UTI, treated with course of Rocephin.  [facility] assumed care 1/5 and patient started on IV antibiotics for aspiration pneumonia.  Mentation waxing and waning and unable to clear swallow evaluation.  Patient currently on tube feeds.further discussion was had w/ family and proceeding with PEG tube placement.  1/8: Mild tachycardia and low 100s, labs with improvement in leukocytosis.  BMP with slight worsening of creatinine to 1.53.  Increasing free water intake through NG tube.  CBG remained elevated, adding NovoLog 7 unit every 4 hourly if continued to receive tube feeding, discontinued later as patient developed some hypoglycemia. Patient had some nausea and vomiting so tube feed is currently being held. Going for PEG tube placement tomorrow     Subjective: Patient was seen and examined today.  Appears lethargic and was unable to participate with any meaningful conversation.  Not answering any questions.  Had 1 emesis per nursing staff earlier today and tube feed was held.   Assessment and Plan: Principal Problem:   Encephalopathy acute Active  Problems:   Aspiration pneumonia (HCC)   Acute renal failure superimposed on stage 3a chronic kidney disease (HCC)   Type 1 diabetes mellitus with hyperglycemia (HCC)   Alcohol abuse   Essential hypertension   GERD without esophagitis   Overweight (BMI 25.0-29.9)   Acute encephalopathy Alcohol abuse with withdrawal and delirium tremens: Acute encephalopathy multifactorial suspecting metabolic also in the setting of chronic alcohol abuse, due to critical illness.MRI ruled out CVA, but does note significant wear and tear disproportionate for patient's age. She had decompensated requiring Precedex drip and mechanical intubation> subsequently weaned off Precedex and extubated 1/4. Now more appropriate still with weak and confusion answer some questions, participating with PT OT saw him.PT and OT recommending CIR, but patient not felt to be good candidate for CIR given waxing waning mental status, so would need skilled nursing. At this time, it is unclear how much of her mentation issues are going to be long-term.  By treating underlying infection, taking out of ICU and minimizing delirium, monitoring for over sedating medications and improving nutrition, hopefully she will improve. Previous MD discussed with the patient's husband and he understands.  If patient not improved, husband is interested in a long-term care facility.  Aspiration pneumonia: Patient on empiric antibiotics with cefepime currently> change to unasyn.  Currently has mild leukocytosis Pro-Cal is 0.37.  She is doing well on room air.  Previously she had low-grade temperature along with leukocytosis and increased procalcitonin chest x-ray within infiltrate suspicious for aspiration pneumonia  AKI on CKD stage IIIa, improved, off IV fluids continue tube feeding  Recent Labs  Lab 04/25/22 0453 04/25/22 0936 04/26/22 0659 04/27/22 0402 04/28/22 0451  BUN  33* 31* 26* 29* 33*  CREATININE 1.45* 1.35* 1.27* 1.37* 1.53*     Alcohol  abuse with withdrawal:patient was drinking 7-8 beers per day and has not had any alcohol in at least several days before coming in.  Patient is through withdrawal continue supportive care thiamine vitamins   Type 1 diabetes mellitus with uncontrolled hyperglycemia: Blood sugar fairly stable.  Seen by diabetes coordinator.  On Semglee 12 units, also on Reglan Recent Labs  Lab 04/28/22 0444 04/28/22 0902 04/28/22 1126 04/28/22 1426 04/28/22 1507  GLUCAP 205* 329* 291* 66* 118*     Essential hypertension: BP stable.  On scheduled IV Lopressor given heart rate staying in the 90s and blood pressures ranging from the 130s to 170s.Once PEG tube in place, can change to p.o. beta-blocker  GERD continue PPI   FEN: Patient with poor oral intake, at this time proceeding with feeding tube placement based on previous discussion continue with ongoing supportive care  Bronchitis-resolved as of 04/25/2022 Completed course of antibiotics to Rocephin and Zithromax   UTI due to Klebsiella species-resolved as of 04/25/2022 Urine cultures growing out pansensitive Klebsiella.  Completed course of Rocephin   Acute respiratory failure with hypoxia (HCC)-resolved as of 04/25/2022 Secondary to agitation.  Required mechanical intubation.   Hypernatremia-resolved as of 04/25/2022 Secondary to lack of p.o. intake while she is confused.  Change IV fluids to half-normal saline.  DVT prophylaxis: enoxaparin (LOVENOX) injection 40 mg Start: 04/19/22 1000 Code Status:   Code Status: Full Code Family Communication:  Patient status is: inpatient  because of ongoing altered mental status Level of care: Telemetry Medical   Dispo: The patient is from: HOME             Anticipated disposition: snf Objective: Vitals last 24 hrs: Vitals:   04/27/22 2354 04/28/22 0440 04/28/22 0857 04/28/22 1118  BP: (!) 151/82 (!) 153/68 (!) 153/76 (!) 140/79  Pulse: (!) 102 97 (!) 108 (!) 110  Resp: 18 18  20   Temp: 97.8 F (36.6 C)  99.3 F (37.4 C) 97.8 F (36.6 C) 99.9 F (37.7 C)  TempSrc: Oral Oral    SpO2: 97% 98% 94% 91%  Weight:      Height:       Weight change:   Physical Examination: General.  Lethargic lady, in no acute distress. Pulmonary.  Lungs clear bilaterally, normal respiratory effort. CV.  Regular rate and rhythm, no JVD, rub or murmur. Abdomen.  Soft, nontender, nondistended, BS positive. CNS.  Lethargic, no apparent focal deficit. Extremities.  No edema, no cyanosis, pulses intact and symmetrical. Psychiatry.  Judgment and insight appears impaired.  Medications reviewed:  Scheduled Meds:  Chlorhexidine Gluconate Cloth  6 each Topical Daily   enoxaparin (LOVENOX) injection  40 mg Subcutaneous Q24H   feeding supplement (PROSource TF20)  60 mL Per Tube Daily   folic acid  1 mg Intravenous Daily   free water  200 mL Per Tube Q4H   insulin aspart  0-20 Units Subcutaneous Q4H   insulin glargine-yfgn  12 Units Subcutaneous Daily   metoCLOPramide (REGLAN) injection  5 mg Intravenous Q8H   metoprolol tartrate  5 mg Intravenous Q6H   mouth rinse  15 mL Mouth Rinse 4 times per day   thiamine (VITAMIN B1) injection  100 mg Intravenous Daily   Continuous Infusions:  sodium chloride Stopped (04/27/22 0619)   ampicillin-sulbactam (UNASYN) IV 200 mL/hr at 04/28/22 1613   [START ON 04/29/2022]  ceFAZolin (ANCEF) IV  feeding supplement (OSMOLITE 1.5 CAL) Stopped (04/28/22 1142)      Diet Order             Diet NPO time specified  Diet effective now                    Nutrition Problem: Inadequate oral intake Etiology: inability to eat (pt sedated and ventilated) Signs/Symptoms: NPO status     Intake/Output Summary (Last 24 hours) at 04/28/2022 1654 Last data filed at 04/28/2022 1613 Gross per 24 hour  Intake 2726.39 ml  Output 1545 ml  Net 1181.39 ml    Net IO Since Admission: 15,966.68 mL [04/28/22 1654]  Wt Readings from Last 3 Encounters:  04/27/22 87.7 kg  08/22/20 92.5  kg  10/28/19 83.9 kg     Unresulted Labs (From admission, onward)     Start     Ordered   04/29/22 0500  CBC with Differential/Platelet  Tomorrow morning,   R       Question:  Specimen collection method  Answer:  Lab=Lab collect   04/28/22 1645   04/29/22 0500  Protime-INR  Tomorrow morning,   R       Question:  Specimen collection method  Answer:  Lab=Lab collect   04/28/22 1645          Data Reviewed: I have personally reviewed following labs and imaging studies CBC: Recent Labs  Lab 04/24/22 0529 04/25/22 0453 04/26/22 0659 04/27/22 0402 04/28/22 0451  WBC 9.8 13.8* 12.2* 14.4* 13.7*  HGB 8.9* 9.4* 10.4* 9.1* 9.4*  HCT 27.9* 28.3* 31.4* 28.3* 28.9*  MCV 102.6* 99.0 98.7 100.4* 100.3*  PLT 386 452* 518* 527* 574*    Basic Metabolic Panel: Recent Labs  Lab 04/22/22 0415 04/23/22 0406 04/23/22 1435 04/24/22 0529 04/24/22 1227 04/25/22 0453 04/25/22 0936 04/26/22 0659 04/27/22 0402 04/28/22 0451  NA 141 134*   < > 138   < > 142 140 142 140 139  K 3.3* 2.8*   < > 3.4*   < > 3.8 3.7 3.9 4.4 4.8  CL 110 108   < > 114*   < > 112* 111 107 106 103  CO2 19* 16*   < > 18*   < > 23 23 25 26 26   GLUCOSE 138* 165*   < > 163*   < > 127* 127* 211* 205* 298*  BUN 28* 25*   < > 33*   < > 33* 31* 26* 29* 33*  CREATININE 1.63* 1.17*   < > 1.60*   < > 1.45* 1.35* 1.27* 1.37* 1.53*  CALCIUM 7.9* 8.0*   < > 8.0*   < > 8.2* 8.2* 8.8* 8.6* 8.7*  MG 2.1 2.0  --  1.8  --  1.6*  --  1.8 2.0  --   PHOS 2.7 2.3*  --  2.2*  --  3.6  --  2.5  --   --    < > = values in this interval not displayed.    GFR: Estimated Creatinine Clearance: 42.9 mL/min (A) (by C-G formula based on SCr of 1.53 mg/dL (H)). Liver Function Tests: Recent Labs  Lab 04/24/22 0529  AST 18  ALT 9  ALKPHOS 87  BILITOT 0.6  PROT 5.9*  ALBUMIN 2.1*    No results for input(s): "LIPASE", "AMYLASE" in the last 168 hours. Recent Labs  Lab 04/22/22 0848  AMMONIA 38*   CBG: Recent Labs  Lab  04/28/22 0444 04/28/22 0902 04/28/22  1126 04/28/22 1426 04/28/22 1507  GLUCAP 205* 329* 291* 66* 118*     Recent Labs  Lab 04/25/22 0936 04/26/22 0659 04/27/22 0402  PROCALCITON 0.67 0.44 0.37     Recent Results (from the past 240 hour(s))  MRSA Next Gen by PCR, Nasal     Status: None   Collection Time: 04/20/22  4:52 PM   Specimen: Nasal Mucosa; Nasal Swab  Result Value Ref Range Status   MRSA by PCR Next Gen NOT DETECTED NOT DETECTED Final    Comment: (NOTE) The GeneXpert MRSA Assay (FDA approved for NASAL specimens only), is one component of a comprehensive MRSA colonization surveillance program. It is not intended to diagnose MRSA infection nor to guide or monitor treatment for MRSA infections. Test performance is not FDA approved in patients less than 68 years old. Performed at [facility], [address] [place], [place] [address]   Culture, blood (Routine X 2) w Reflex to ID Panel     Status: None   Collection Time: 04/20/22  5:25 PM   Specimen: BLOOD  Result Value Ref Range Status   Specimen Description BLOOD BLOOD LEFT ARM  Final   Special Requests   Final    BOTTLES DRAWN AEROBIC AND ANAEROBIC Blood Culture adequate volume   Culture   Final    NO GROWTH 5 DAYS Performed at [facility], [address] [place], [place] [address]    Report Status 04/25/2022 FINAL  Final  Culture, blood (Routine X 2) w Reflex to ID Panel     Status: None   Collection Time: 04/20/22  5:33 PM   Specimen: BLOOD  Result Value Ref Range Status   Specimen Description BLOOD BLOOD RIGHT HAND  Final   Special Requests   Final    BOTTLES DRAWN AEROBIC AND ANAEROBIC Blood Culture adequate volume   Culture   Final    NO GROWTH 5 DAYS Performed at [facility], [address] [place], [place] [address]    Report Status 04/25/2022 FINAL  Final  Resp panel by RT-PCR (RSV, Flu A&B, Covid) Anterior Nasal Swab     Status: None   Collection Time:  04/21/22 10:22 PM   Specimen: Anterior Nasal Swab  Result Value Ref Range Status   SARS Coronavirus 2 by RT PCR NEGATIVE NEGATIVE Final    Comment: (NOTE) SARS-CoV-2 target nucleic acids are NOT DETECTED.  The SARS-CoV-2 RNA is generally detectable in upper respiratory specimens during the acute phase of infection. The lowest concentration of SARS-CoV-2 viral copies this assay can detect is 138 copies/mL. A negative result does not preclude SARS-Cov-2 infection and should not be used as the sole basis for treatment or other patient management decisions. A negative result may occur with  improper specimen collection/handling, submission of specimen other than nasopharyngeal swab, presence of viral mutation(s) within the areas targeted by this assay, and inadequate number of viral copies(<138 copies/mL). A negative result must be combined with clinical observations, patient history, and epidemiological information. The expected result is Negative.  Fact Sheet for Patients:  EntrepreneurPulse.com.au  Fact Sheet for Healthcare Providers:  IncredibleEmployment.be  This test is no t yet approved or cleared by the [place] FDA and  has been authorized for detection and/or diagnosis of SARS-CoV-2 by FDA under an Emergency Use Authorization (EUA). This EUA will remain  in effect (meaning this test can be used) for the duration of the COVID-19 declaration under Section 564(b)(1) of the Act, 21 U.S.C.section 360bbb-3(b)(1),  unless the authorization is terminated  or revoked sooner.       Influenza A by PCR NEGATIVE NEGATIVE Final   Influenza B by PCR NEGATIVE NEGATIVE Final    Comment: (NOTE) The Xpert Xpress SARS-CoV-2/FLU/RSV plus assay is intended as an aid in the diagnosis of influenza from Nasopharyngeal swab specimens and should not be used as a sole basis for treatment. Nasal washings and aspirates are unacceptable for Xpert Xpress  SARS-CoV-2/FLU/RSV testing.  Fact Sheet for Patients: EntrepreneurPulse.com.au  Fact Sheet for Healthcare Providers: IncredibleEmployment.be  This test is not yet approved or cleared by the [place] FDA and has been authorized for detection and/or diagnosis of SARS-CoV-2 by FDA under an Emergency Use Authorization (EUA). This EUA will remain in effect (meaning this test can be used) for the duration of the COVID-19 declaration under Section 564(b)(1) of the Act, 21 U.S.C. section 360bbb-3(b)(1), unless the authorization is terminated or revoked.     Resp Syncytial Virus by PCR NEGATIVE NEGATIVE Final    Comment: (NOTE) Fact Sheet for Patients: EntrepreneurPulse.com.au  Fact Sheet for Healthcare Providers: IncredibleEmployment.be  This test is not yet approved or cleared by the [place] FDA and has been authorized for detection and/or diagnosis of SARS-CoV-2 by FDA under an Emergency Use Authorization (EUA). This EUA will remain in effect (meaning this test can be used) for the duration of the COVID-19 declaration under Section 564(b)(1) of the Act, 21 U.S.C. section 360bbb-3(b)(1), unless the authorization is terminated or revoked.  Performed at [facility], [address] [place], [place] [address]   Culture, blood (Routine X 2) w Reflex to ID Panel     Status: None   Collection Time: 04/23/22  3:20 PM   Specimen: BLOOD  Result Value Ref Range Status   Specimen Description BLOOD  Final   Special Requests BOTTLES DRAWN AEROBIC AND ANAEROBIC BCLV  Final   Culture   Final    NO GROWTH 5 DAYS Performed at [facility], [address] [place], [place] [address]    Report Status 04/28/2022 FINAL  Final  Culture, blood (Routine X 2) w Reflex to ID Panel     Status: None   Collection Time: 04/23/22  3:23 PM   Specimen: BLOOD  Result Value Ref Range Status   Specimen  Description BLOOD BLOOD LEFT HAND  Final   Special Requests BOTTLES DRAWN AEROBIC AND ANAEROBIC BCAV  Final   Culture   Final    NO GROWTH 5 DAYS Performed at [facility], [address] [place], [place] [address]    Report Status 04/28/2022 FINAL  Final  Culture, Respiratory w Gram Stain     Status: None   Collection Time: 04/23/22  3:50 PM  Result Value Ref Range Status   Specimen Description   Final    EXPECTORATED SPUTUM Performed at [facility], [address], [place], [place] [address]    Special Requests   Final    NONE Performed at [facility], [address] [place], [place] [address]    Gram Stain   Final    MODERATE WBC PRESENT, PREDOMINANTLY PMN NO ORGANISMS SEEN    Culture   Final    Normal respiratory flora-no Staph aureus or Pseudomonas seen Performed at [facility] [address] [place],  [address]    Report Status 04/26/2022 FINAL  Final    Antimicrobials: Anti-infectives (From admission, onward)    Start  Dose/Rate Route Frequency Ordered Stop   04/29/22 0000  ceFAZolin (ANCEF) IVPB 2g/100 mL premix        2 g 200 mL/hr over 30 Minutes Intravenous To Radiology 04/28/22 1640 04/30/22 0000   04/27/22 1400  Ampicillin-Sulbactam (UNASYN) 3 g in sodium chloride 0.9 % 100 mL IVPB        3 g 200 mL/hr over 30 Minutes Intravenous Every 6 hours 04/27/22 1143     04/25/22 1515  ceFEPIme (MAXIPIME) 2 g in sodium chloride 0.9 % 100 mL IVPB  Status:  Discontinued        2 g 200 mL/hr over 30 Minutes Intravenous Every 12 hours 04/25/22 1418 04/27/22 1143   04/19/22 1815  cefTRIAXone (ROCEPHIN) 1 g in sodium chloride 0.9 % 100 mL IVPB        1 g 200 mL/hr over 30 Minutes Intravenous Every 24 hours 04/19/22 1715 04/23/22 1900   04/19/22 1815  azithromycin (ZITHROMAX) 500 mg in sodium chloride 0.9 % 250 mL IVPB  Status:  Discontinued        500 mg 250 mL/hr over 60 Minutes Intravenous Every 24 hours 04/19/22 1715  04/23/22 1434   04/18/22 1500  doxycycline (VIBRAMYCIN) 100 mg in sodium chloride 0.9 % 250 mL IVPB  Status:  Discontinued        100 mg 125 mL/hr over 120 Minutes Intravenous Every 12 hours 04/18/22 1344 04/19/22 1715      Culture/Microbiology    Component Value Date/Time   SDES  04/23/2022 1550    EXPECTORATED SPUTUM Performed at [facility], [address] [place], [place] [address]    [facility]  04/23/2022 1550    NONE Performed at [facility], [address], [place], [place] [address]    CULT  04/23/2022 1550    Normal respiratory flora-no Staph aureus or Pseudomonas seen Performed at [facility] [address] [place],  [address]    REPTSTATUS 04/26/2022 FINAL 04/23/2022 1550  Radiology Studies: DG Abd 1 View  Result Date: 04/26/2022 CLINICAL DATA:  Nasogastric tube placement. EXAM: ABDOMEN - 1 VIEW COMPARISON:  April 22, 2022 FINDINGS: A feeding tube is seen with its distal tip overlying the expected region of the gastric antrum. The bowel gas pattern is normal. No radio-opaque calculi or other significant radiographic abnormality are seen. IMPRESSION: Feeding tube positioning, as described above. Electronically Signed   By: Virgina Norfolk M.D.   On: 04/26/2022 22:43     LOS: 10 days   Lorella Nimrod, MD [facility]  04/28/2022, 4:54 PM

## 2022-04-28 NOTE — Progress Notes (Signed)
Patient had order for 7 units insulin around 1400. Checked CBG before giving and it was 66. Made MD aware. Gave Amp D50, did not give the 7 units insulin. Patients tube feeds were stopped because patient has been vomiting and spitting up brown substance. Tube feeds are held for now. New order for D5 fluids to start.

## 2022-04-28 NOTE — Progress Notes (Signed)
Physical Therapy Treatment Patient Details Name: Laurie Lin MRN: [id number] DOB: 03-18-1956 Today's Date: 04/28/2022   History of Present Illness Pt is a 67 y/o F admitted on 04/17/22 after presenting with c/o worsening confusion for the last month. Work up revealed UTI, AKI, hypomagnesemia, hyperkalemia. During admission pt became more agitated & intubated on 1/1, extubated 1/4. PMH: DM1, CKD 3A, HTN, alcohol use    PT Comments    Patient seen in conjunction with OT to maximize patient's activity tolerance and progress mobility as able. Placed tube feeds on hold at beginning of session. Patient supine in bed on arrival with bilateral mittens on and soiled bed linens. Patient not following commands this session and no attempts at verbalization. Required totalA+2 for rolling L/R for linen change with patient resisting all attempts of movement. Unsafe to attempt EOB this date. Performed oral care with patient resisting mouth opening. RN present when patient began vomitting brown emesis. Suctioned mouth to prevent potential aspiration. RN made MD aware. Placed mittens back on at end of session and continued with held tube feeds. Updated discharge recommendation to SNF.     Recommendations for follow up therapy are one component of a multi-disciplinary discharge planning process, led by the attending physician.  Recommendations may be updated based on patient status, additional functional criteria and insurance authorization.  Follow Up Recommendations  Skilled nursing-short term rehab (<3 hours/day) Can patient physically be transported by private vehicle: No   Assistance Recommended at Discharge Frequent or constant Supervision/Assistance  Patient can return home with the following Two people to help with walking and/or transfers;Direct supervision/assist for medications management;Two people to help with bathing/dressing/bathroom;Help with stairs or ramp for entrance;Assist for  transportation;Assistance with feeding;Direct supervision/assist for financial management;Assistance with cooking/housework   Equipment Recommendations  None recommended by PT    Recommendations for Other Services       Precautions / Restrictions Precautions Precautions: Fall Precaution Comments: NG, foley, rectal tube; mittens Restrictions Weight Bearing Restrictions: No     Mobility  Bed Mobility Overal bed mobility: Needs Assistance Bed Mobility: Rolling Rolling: Total assist, +2 for physical assistance, +2 for safety/equipment         General bed mobility comments: totalA+2 to roll L/R for linen change with patient resisting any attempts moving. unsafe to attempt EOB this date    Transfers                        Ambulation/Gait                   Stairs             Wheelchair Mobility    Modified Rankin (Stroke Patients Only)       Balance                                            Cognition Arousal/Alertness: Awake/alert Behavior During Therapy: Restless Overall Cognitive Status: Difficult to assess                                 General Comments: Seemed to follow 1 command during session otherwise not following commands. No attempts at verbalization this session        Exercises      General Comments General comments (skin integrity,  edema, etc.): Held tube feeds at beginning of session. Performed oral care on patient with HOB elevated. RN present as patient vomitted brown emesis. MD aware by RN      Pertinent Vitals/Pain Pain Assessment Pain Assessment: Faces Faces Pain Scale: No hurt Pain Intervention(s): Monitored during session    Home Living                          Prior Function            PT Goals (current goals can now be found in the care plan section) Acute Rehab PT Goals Patient Stated Goal: did not state PT Goal Formulation: With family Time For Goal  Achievement: 05/10/22 Potential to Achieve Goals: Fair Progress towards PT goals: Not progressing toward goals - comment    Frequency    Min 2X/week      PT Plan Discharge plan needs to be updated;Frequency needs to be updated    Co-evaluation PT/OT/SLP Co-Evaluation/Treatment: Yes Reason for Co-Treatment: For patient/therapist safety;To address functional/ADL transfers;Necessary to address cognition/behavior during functional activity PT goals addressed during session: Mobility/safety with mobility        AM-PAC PT "6 Clicks" Mobility   Outcome Measure  Help needed turning from your back to your side while in a flat bed without using bedrails?: Total Help needed moving from lying on your back to sitting on the side of a flat bed without using bedrails?: Total Help needed moving to and from a bed to a chair (including a wheelchair)?: Total Help needed standing up from a chair using your arms (e.g., wheelchair or bedside chair)?: Total Help needed to walk in hospital room?: Total Help needed climbing 3-5 steps with a railing? : Total 6 Click Score: 6    End of Session   Activity Tolerance: Other (comment) (limited by resistive behaviors and emesis) Patient left: in bed;with call bell/phone within reach;with bed alarm set;with nursing/sitter in room;with restraints reapplied Nurse Communication: Mobility status PT Visit Diagnosis: Unsteadiness on feet (R26.81);Difficulty in walking, not elsewhere classified (R26.2);Muscle weakness (generalized) (M62.81)     Time: 9628-3662 PT Time Calculation (min) (ACUTE ONLY): 27 min  Charges:  $Therapeutic Activity: 8-22 mins                     Carollynn Pennywell A. Gilford Rile PT, DPT [facility] - Acute Rehabilitation Services    Lalania Haseman A Atalie Oros 04/28/2022, 12:55 PM

## 2022-04-28 NOTE — Consult Note (Signed)
Chief Complaint: Patient was seen in consultation today for encphalopathy at the request of Gevena Barre, MD  Supervising Physician: Markus Daft  Patient Status: [place] - In-pt  History of Present Illness: Laurie Lin is a 67 y.o. female with past medical history of diabetes mellitus type 1, stage IIIa chronic kidney disease, hypertension and ongoing alcohol use who was brought in for worsening confusion for the last month. Workup revealed urinary tract infection, acute kidney injury, hypomagnesemia and hyperkalemia. MRI unremarkable for CVA.  Due to agitation she required critical care services and intubation.  She was extubated on 1/4.  Her mentation waxes and wanes.  Inability to assess current swallow function due to cognitive deficit.  Patient currently undergoing NG tube feeds.  IR consulted for placement of G-tube.    Patient examined bedside. No family accompanies patient this afternoon.  She is now extubated and  in no apparent distress, wearing soft mitten restrains and occasionally tracks with eyes.  Patient does not attempt to speak spontaneously or when spoken to.    Past Medical History:  Diagnosis Date   Diabetes mellitus without complication (HCC)    GERD (gastroesophageal reflux disease)    Hypertension    Renal disorder    stage 3     Past Surgical History:  Procedure Laterality Date   DILATION AND CURETTAGE OF UTERUS     Miscarriage   EYE SURGERY     bilateral caterac   FINGER SURGERY     KNEE ARTHROSCOPY Left 1989   [facility]   PILONIDAL CYST DRAINAGE      Allergies: Ibuprofen, Nsaids, Brimonidine, and Latex  Medications: Prior to Admission medications   Medication Sig Start Date End Date Taking? Authorizing Provider  acetaminophen (TYLENOL) 500 MG tablet Take 500 mg by mouth 2 (two) times daily as needed.    Yes [provider]  esomeprazole (NEXIUM) 40 MG capsule TAKE 1 CAPSULE (40 MG TOTAL) BY MOUTH 2 (TWO) TIMES DAILY BEFORE A MEAL.  08/13/19  Yes Malfi, Lupita Raider, FNP  insulin aspart (NOVOLOG) 100 UNIT/ML injection Inject 0-9 Units into the skin 3 (three) times daily with meals. BS 70-120 - 0 units       121-150 - 1 units       151-200 - 2 units        201-250 - 3 units        251-300 - 4 units        301-350 - 5 units        351-400 - 6 units        > 400 call md. 03/27/17  Yes Sainani, Belia Heman, MD  Insulin Glargine (BASAGLAR KWIKPEN) 100 UNIT/ML SOPN Inject 12 Units into the skin at bedtime.  07/01/18  Yes [provider]  lisinopril (ZESTRIL) 2.5 MG tablet Take 1 tablet (2.5 mg total) by mouth daily. 12/02/18  Yes Mikey College, NP  Multiple Vitamin (MULTIVITAMIN WITH MINERALS) TABS tablet Take 1 tablet by mouth daily. 03/28/17  Yes Sainani, Belia Heman, MD  Netarsudil Dimesylate (RHOPRESSA) 0.02 % SOLN Place 1 drop into both eyes every evening.   Yes [provider]  tiZANidine (ZANAFLEX) 4 MG capsule Take 1 capsule (4 mg total) by mouth 3 (three) times daily as needed for muscle spasms. 10/28/19  Yes Amyot, Nicholes Stairs, NP  torsemide (DEMADEX) 20 MG tablet Take 20 mg by mouth daily.   Yes [provider]  Travoprost, BAK Free, (TRAVATAN) 0.004 % SOLN ophthalmic  solution Place 1 drop into both eyes at bedtime.   Yes [provider]  BD ULTRA-FINE PEN NEEDLES 29G X 12.7MM MISC 1 EACH BY MISCELLANEOUS ROUTE FIVE (5) TIMES A DAY. 03/09/18   [provider]  Continuous Blood Gluc Sensor (DEXCOM G6 SENSOR) MISC by Does not apply route. 11/10/18   [provider]  dorzolamide (TRUSOPT) 2 % ophthalmic solution TAKE 1 DROP(S) IN RIGHT EYE 3 TIMES A DAY Patient not taking: Reported on 04/18/2022 10/05/18   [provider]  estradiol (ESTRACE) 0.1 MG/GM vaginal cream Estrogen Cream Instruction Discard applicator Apply pea sized amount to tip of finger to urethra before bed. Wash hands well after application. Use Monday, Wednesday and Friday Patient not taking: Reported on  04/18/2022 08/22/20   Billey Co, MD  furosemide (LASIX) 20 MG tablet Take 1 tablet (20 mg total) by mouth daily as needed for edema. Patient not taking: Reported on 04/18/2022 12/02/18   Mikey College, NP  HYDROcodone-acetaminophen (NORCO/VICODIN) 5-325 MG tablet Take 1 tablet by mouth every 6 (six) hours as needed for severe pain. Patient not taking: Reported on 04/18/2022 10/28/19   Katy Apo, NP  Insulin Pen Needle 32G X 4 MM MISC ok to sub any brand or size needle preferred by insurance/patient, use up to 5x/day, dx E10.65 03/02/18   [provider]  Jonetta Speak LANCETS 71I MISC 1 Device by Does not apply route 4 (four) times daily. 05/20/17   Mikey College, NP  sulfamethoxazole-trimethoprim (BACTRIM DS) 800-160 MG tablet Take 1 tablet by mouth every 12 (twelve) hours. Patient not taking: Reported on 04/18/2022 08/29/20   Billey Co, MD  sertraline (ZOLOFT) 50 MG tablet TAKE 1 TABLET BY MOUTH EVERY DAY 12/13/18 10/28/19  Mikey College, NP     Family History  Problem Relation Age of Onset   Diabetes Mother     Social History   Socioeconomic History   Marital status: Married    Spouse name: Not on file   Number of children: Not on file   Years of education: Not on file   Highest education level: Not on file  Occupational History   Not on file  Tobacco Use   Smoking status: Never   Smokeless tobacco: Never  Vaping Use   Vaping Use: Never used  Substance and Sexual Activity   Alcohol use: Yes    Alcohol/week: 6.0 standard drinks of alcohol    Types: 3 Cans of beer, 3 Shots of liquor per week    Comment: heavy drinker   Drug use: No   Sexual activity: Yes  Other Topics Concern   Not on file  Social History Narrative   Not on file   Social Determinants of Health   Financial Resource Strain: Not on file  Food Insecurity: No Food Insecurity (04/19/2022)   Hunger Vital Sign    Worried About Running Out of Food in the Last  Year: Never true    Ran Out of Food in the Last Year: Never true  Transportation Needs: No Transportation Needs (04/19/2022)   PRAPARE - Hydrologist (Medical): No    Lack of Transportation (Non-Medical): No  Physical Activity: Not on file  Stress: Not on file  Social Connections: Not on file    Review of Systems: not obtained  Vital Signs: BP (!) 140/79 (BP Location: Left Arm)   Pulse (!) 110   Temp 99.9 F (37.7 C)   Resp  20   Ht 5\' 10"  (1.778 m)   Wt 193 lb 5.5 oz (87.7 kg)   LMP 09/09/2004 (Approximate)   SpO2 91%   BMI 27.74 kg/m   Physical Exam Vitals reviewed.  Constitutional:      General: She is awake.  Cardiovascular:     Rate and Rhythm: Normal rate.  Pulmonary:     Effort: Pulmonary effort is normal. No respiratory distress.  Abdominal:     General: Abdomen is flat.     Palpations: Abdomen is soft.  Skin:    General: Skin is warm and dry.  Psychiatric:        Speech: She is noncommunicative.     Imaging: DG Abd 1 View  Result Date: 04/26/2022 CLINICAL DATA:  Nasogastric tube placement. EXAM: ABDOMEN - 1 VIEW COMPARISON:  April 22, 2022 FINDINGS: A feeding tube is seen with its distal tip overlying the expected region of the gastric antrum. The bowel gas pattern is normal. No radio-opaque calculi or other significant radiographic abnormality are seen. IMPRESSION: Feeding tube positioning, as described above. Electronically Signed   By: Virgina Norfolk M.D.   On: 04/26/2022 22:43   DG Chest Port 1 View  Result Date: 04/25/2022 CLINICAL DATA:  Pneumonia EXAM: PORTABLE CHEST 1 VIEW COMPARISON:  04/23/2022 FINDINGS: The endotracheal tube is no longer present. Feeding tube coiled in the stomach. Airway thickening is present, suggesting bronchitis or reactive airways disease. New right perihilar airspace opacity suspicious for early pneumonia. Left lung appears clear. Hazy accentuated density at the right lung base with some  obscuration of the right lateral hemidiaphragm, potentially from atelectasis or pneumonia. Heart size within normal limits for portable technique. IMPRESSION: 1. New right perihilar airspace opacity suspicious for early pneumonia. 2. Hazy accentuated density at the right lung base with obscuration of the right lateral hemidiaphragm, potentially from atelectasis or pneumonia. 3. Airway thickening is present, suggesting bronchitis or reactive airways disease. Electronically Signed   By: Van Clines M.D.   On: 04/25/2022 11:58   DG Chest Port 1 View  Result Date: 04/23/2022 CLINICAL DATA:  Respiratory failure, endotracheal tube positioning. EXAM: PORTABLE CHEST 1 VIEW COMPARISON:  04/21/2022 FINDINGS: The endotracheal tube tip is 3.7 cm above the carina, satisfactorily positioned. Feeding tube tip in the stomach. Mild biapical pleuroparenchymal scarring. Linear subsegmental atelectasis or scarring at the left lung base. Cardiac and mediastinal margins appear normal. IMPRESSION: 1. The endotracheal tube tip is 3.7 cm above the carina, satisfactorily positioned. 2. Feeding tube tip in the stomach. Electronically Signed   By: Van Clines M.D.   On: 04/23/2022 11:59   DG Abd 1 View  Result Date: 04/22/2022 CLINICAL DATA:  Dyspnea, Dobbhoff placement EXAM: ABDOMEN - 1 VIEW COMPARISON:  CT 04/17/2022 FINDINGS: Feeding tube tip overlies the stomach. No evidence of bowel obstruction. No acute osseous abnormality. IMPRESSION: Feeding tube tip overlies the stomach. Electronically Signed   By: Maurine Simmering M.D.   On: 04/22/2022 14:12   CT HEAD WO CONTRAST (5MM)  Result Date: 04/22/2022 CLINICAL DATA:  Altered mental status. EXAM: CT HEAD WITHOUT CONTRAST TECHNIQUE: Contiguous axial images were obtained from the base of the skull through the vertex without intravenous contrast. RADIATION DOSE REDUCTION: This exam was performed according to the departmental dose-optimization program which includes automated  exposure control, adjustment of the mA and/or kV according to patient size and/or use of iterative reconstruction technique. COMPARISON:  CT Head 04/18/22 FINDINGS: Brain: There is sequela of moderate chronic microvascular ischemic change  with advanced generalized volume loss. Size and shape of the ventricular system is unchanged compared to recent prior head CT. No evidence of hemorrhage. No CT evidence of a new infarct. No extra-axial fluid collection. Vascular: No hyperdense vessel or unexpected calcification. Skull: Normal. Negative for fracture or focal lesion. Sinuses/Orbits: Bilateral lens replacement. Mild mucosal thickening bilateral maxillary and ethmoid sinuses. Bilateral mastoid and middle ears are clear. Frothy secretions in the nasopharynx, nonspecific. Other: None. IMPRESSION: 1. No acute intracranial abnormality. 2. Sequela of moderate chronic microvascular ischemic change with advanced generalized volume loss. Electronically Signed   By: Marin Roberts M.D.   On: 04/22/2022 13:54   DG Chest Port 1 View  Result Date: 04/21/2022 CLINICAL DATA:  Endotracheal tube placement. EXAM: PORTABLE CHEST 1 VIEW COMPARISON:  04/18/2022 FINDINGS: An endotracheal tube is noted just above the carina - recommend 2-3 cm retraction. The cardiomediastinal silhouette is unremarkable. There is no evidence of focal airspace disease, pulmonary edema, suspicious pulmonary nodule/mass, pleural effusion, or pneumothorax. No acute bony abnormalities are identified. IMPRESSION: 1. Endotracheal tube just above the carina - recommend 2-3 cm retraction. 2. No active disease. Electronically Signed   By: Margarette Canada M.D.   On: 04/21/2022 17:37   ECHOCARDIOGRAM COMPLETE  Result Date: 04/21/2022    ECHOCARDIOGRAM REPORT   Patient Name:   Laurie Lin Date of Exam: 04/20/2022 Medical Rec #:  [id number]       Height:       70.0 in Accession #:    [id number]      Weight:       205.0 lb Date of Birth:  1955/11/19       BSA:           2.109 m Patient Age:    42 years        BP:           159/79 mmHg Patient Gender: F               HR:           102 bpm. Exam Location:  [facility] Procedure: 2D Echo, Color Doppler and Cardiac Doppler Indications:     I50.31 congestive heart failure-Acute Diastolic  History:         Patient has no prior history of Echocardiogram examinations.                  Risk Factors:Hypertension and Diabetes.  Sonographer:     Charmayne Sheer Referring Phys:  [id number] [facility] Diagnosing Phys: Isaias Cowman MD  Sonographer Comments: Suboptimal apical window. Image acquisition challenging due to respiratory motion and and inability to awaken patient. IMPRESSIONS  1. Left ventricular ejection fraction, by estimation, is 50 to 55%. The left ventricle has low normal function. The left ventricle has no regional wall motion abnormalities. Left ventricular diastolic parameters were normal.  2. Right ventricular systolic function is normal. The right ventricular size is normal.  3. The mitral valve is normal in structure. Trivial mitral valve regurgitation. No evidence of mitral stenosis.  4. The aortic valve is normal in structure. Aortic valve regurgitation is not visualized. No aortic stenosis is present.  5. The inferior vena cava is normal in size with greater than 50% respiratory variability, suggesting right atrial pressure of 3 mmHg. FINDINGS  Left Ventricle: Left ventricular ejection fraction, by estimation, is 50 to 55%. The left ventricle has low normal function. The left ventricle has no regional wall motion abnormalities. The left ventricular internal  cavity size was normal in size. There is no left ventricular hypertrophy. Left ventricular diastolic parameters were normal. Right Ventricle: The right ventricular size is normal. No increase in right ventricular wall thickness. Right ventricular systolic function is normal. Left Atrium: Left atrial size was normal in size. Right Atrium: Right atrial size was normal in  size. Pericardium: There is no evidence of pericardial effusion. Mitral Valve: The mitral valve is normal in structure. Trivial mitral valve regurgitation. No evidence of mitral valve stenosis. Tricuspid Valve: The tricuspid valve is normal in structure. Tricuspid valve regurgitation is trivial. No evidence of tricuspid stenosis. Aortic Valve: The aortic valve is normal in structure. Aortic valve regurgitation is not visualized. No aortic stenosis is present. Aortic valve mean gradient measures 4.0 mmHg. Aortic valve peak gradient measures 7.2 mmHg. Aortic valve area, by VTI measures 1.64 cm. Pulmonic Valve: The pulmonic valve was normal in structure. Pulmonic valve regurgitation is not visualized. No evidence of pulmonic stenosis. Aorta: The aortic root is normal in size and structure. Venous: The inferior vena cava is normal in size with greater than 50% respiratory variability, suggesting right atrial pressure of 3 mmHg. IAS/Shunts: No atrial level shunt detected by color flow Doppler.  LEFT VENTRICLE PLAX 2D LVIDd:         4.40 cm   Diastology LVIDs:         3.20 cm   LV e' medial:    7.94 cm/s LV PW:         0.90 cm   LV E/e' medial:  10.3 LV IVS:        0.80 cm   LV e' lateral:   8.27 cm/s LVOT diam:     1.80 cm   LV E/e' lateral: 9.9 LV SV:         39 LV SV Index:   18 LVOT Area:     2.54 cm  RIGHT VENTRICLE RV Basal diam:  3.00 cm RV S prime:     19.00 cm/s LEFT ATRIUM             Index        RIGHT ATRIUM           Index LA diam:        3.30 cm 1.56 cm/m   RA Area:     12.90 cm LA Vol (A2C):   32.6 ml 15.45 ml/m  RA Volume:   27.20 ml  12.89 ml/m LA Vol (A4C):   54.9 ml 26.03 ml/m LA Biplane Vol: 45.8 ml 21.71 ml/m  AORTIC VALVE                    PULMONIC VALVE AV Area (Vmax):    1.96 cm     PV Vmax:       1.03 m/s AV Area (Vmean):   1.90 cm     PV Vmean:      63.500 cm/s AV Area (VTI):     1.64 cm     PV VTI:        0.171 m AV Vmax:           134.00 cm/s  PV Peak grad:  4.2 mmHg AV Vmean:           93.800 cm/s  PV Mean grad:  2.0 mmHg AV VTI:            0.237 m AV Peak Grad:      7.2 mmHg AV Mean Grad:  4.0 mmHg LVOT Vmax:         103.00 cm/s LVOT Vmean:        70.200 cm/s LVOT VTI:          0.153 m LVOT/AV VTI ratio: 0.65  AORTA Ao Root diam: 3.10 cm MITRAL VALVE MV Area (PHT): 5.66 cm    SHUNTS MV Decel Time: 134 msec    Systemic VTI:  0.15 m MV E velocity: 81.80 cm/s  Systemic Diam: 1.80 cm MV A velocity: 81.80 cm/s MV E/A ratio:  1.00 Isaias Cowman MD Electronically signed by Isaias Cowman MD Signature Date/Time: 04/21/2022/9:57:17 AM    Final    MR BRAIN W WO CONTRAST  Result Date: 04/18/2022 CLINICAL DATA:  Altered mental status EXAM: MRI HEAD WITHOUT AND WITH CONTRAST TECHNIQUE: Multiplanar, multiecho pulse sequences of the brain and surrounding structures were obtained without and with intravenous contrast. CONTRAST:  77mL GADAVIST GADOBUTROL 1 MMOL/ML IV SOLN COMPARISON:  None Available. FINDINGS: Brain: No acute infarction, hemorrhage, hydrocephalus, extra-axial collection or mass lesion. There is diffuse, advanced volume loss for age. Ex vacuo ventriculomegaly. No chronic microhemorrhage. There is multifocal hyperintense T2-weighted signal within the periventricular and deep white matter. Vascular: Normal flow voids. Skull and upper cervical spine: Normal marrow signal. Sinuses/Orbits: Negative. Other: None IMPRESSION: 1. No acute intracranial abnormality. 2. Advanced diffuse volume loss for age with periventricular white matter hyperintensities. 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   On: 04/18/2022 19:33   DG Chest Port 1 View  Result Date: 04/18/2022 CLINICAL DATA:  66 year old female with history of aspiration. EXAM: PORTABLE CHEST 1 VIEW COMPARISON:  Chest x-ray 03/21/2017. FINDINGS: Lung volumes are slightly low. No confluent consolidative airspace disease. No  pleural effusions. Diffuse interstitial prominence and peribronchial cuffing noted in the lungs bilaterally. No pneumothorax. No evidence of pulmonary edema. Heart size is normal. Upper mediastinal contours are within normal limits. Atherosclerotic calcifications in the thoracic aorta. IMPRESSION: 1. The appearance of the chest suggest an acute bronchitis, as above. 2. Aortic atherosclerosis. Electronically Signed   By: Vinnie Langton M.D.   On: 04/18/2022 10:15   CT HEAD WO CONTRAST (5MM)  Result Date: 04/18/2022 CLINICAL DATA:  Mental status change, unknown cause; Neck trauma (Age >= 65y) EXAM: CT HEAD WITHOUT CONTRAST CT CERVICAL SPINE WITHOUT CONTRAST TECHNIQUE: Multidetector CT imaging of the head and cervical spine was performed following the standard protocol without intravenous contrast. Multiplanar CT image reconstructions of the cervical spine were also generated. RADIATION DOSE REDUCTION: This exam was performed according to the departmental dose-optimization program which includes automated exposure control, adjustment of the mA and/or kV according to patient size and/or use of iterative reconstruction technique. COMPARISON:  CT head 03/23/2017 FINDINGS: CT HEAD FINDINGS Brain: Moderate parenchymal volume loss is present, relatively advanced given the patient's age. Superimposed mild periventricular white matter changes are present, likely reflecting the sequela of small vessel ischemia. There is moderate ventriculomegaly which appears progressive since prior examination and appears disproportionate to the degree of parenchymal volume loss. While this may represent asymmetric central atrophy, changes of normal pressure hydrocephalus could appear similarly. No evidence of acute intracranial hemorrhage or infarct. No abnormal mass effect or midline shift. No abnormal intra or extra-axial mass lesion or fluid collection. Cerebellum unremarkable. Vascular: No hyperdense vessel or unexpected  calcification. Skull: Normal. Negative for fracture or focal lesion. Sinuses/Orbits: No acute finding. Other: Mastoid air cells and middle  ear cavities are clear. CT CERVICAL SPINE FINDINGS Alignment: Mild reversal of the normal cervical lordosis. No listhesis. Skull base and vertebrae: Craniocervical alignment is normal. The atlantodental interval is not widened. No acute fracture of the cervical spine. Vertebral body height is preserved. Soft tissues and spinal canal: No prevertebral fluid or swelling. No visible canal hematoma. Disc levels: Intervertebral disc space narrowing and endplate remodeling is seen at C5-C7, more severe at C5-6 in keeping with changes of moderate to severe degenerative disc disease. Prevertebral soft tissues are not thickened on sagittal reformats. Spinal canal is widely patent. No high-grade neuroforaminal narrowing identified. Upper chest: Negative. Other: None IMPRESSION: 1. No acute intracranial abnormality. No calvarial fracture. 2. Progressive ventriculomegaly, disproportionate to the degree of parenchymal volume loss. While this may represent asymmetric central atrophy, changes of normal pressure hydrocephalus could appear similarly. 3. No acute fracture or listhesis of the cervical spine. Electronically Signed   By: Fidela Salisbury M.D.   On: 04/18/2022 00:53   CT Cervical Spine Wo Contrast  Result Date: 04/18/2022 CLINICAL DATA:  Mental status change, unknown cause; Neck trauma (Age >= 65y) EXAM: CT HEAD WITHOUT CONTRAST CT CERVICAL SPINE WITHOUT CONTRAST TECHNIQUE: Multidetector CT imaging of the head and cervical spine was performed following the standard protocol without intravenous contrast. Multiplanar CT image reconstructions of the cervical spine were also generated. RADIATION DOSE REDUCTION: This exam was performed according to the departmental dose-optimization program which includes automated exposure control, adjustment of the mA and/or kV according to patient  size and/or use of iterative reconstruction technique. COMPARISON:  CT head 03/23/2017 FINDINGS: CT HEAD FINDINGS Brain: Moderate parenchymal volume loss is present, relatively advanced given the patient's age. Superimposed mild periventricular white matter changes are present, likely reflecting the sequela of small vessel ischemia. There is moderate ventriculomegaly which appears progressive since prior examination and appears disproportionate to the degree of parenchymal volume loss. While this may represent asymmetric central atrophy, changes of normal pressure hydrocephalus could appear similarly. No evidence of acute intracranial hemorrhage or infarct. No abnormal mass effect or midline shift. No abnormal intra or extra-axial mass lesion or fluid collection. Cerebellum unremarkable. Vascular: No hyperdense vessel or unexpected calcification. Skull: Normal. Negative for fracture or focal lesion. Sinuses/Orbits: No acute finding. Other: Mastoid air cells and middle ear cavities are clear. CT CERVICAL SPINE FINDINGS Alignment: Mild reversal of the normal cervical lordosis. No listhesis. Skull base and vertebrae: Craniocervical alignment is normal. The atlantodental interval is not widened. No acute fracture of the cervical spine. Vertebral body height is preserved. Soft tissues and spinal canal: No prevertebral fluid or swelling. No visible canal hematoma. Disc levels: Intervertebral disc space narrowing and endplate remodeling is seen at C5-C7, more severe at C5-6 in keeping with changes of moderate to severe degenerative disc disease. Prevertebral soft tissues are not thickened on sagittal reformats. Spinal canal is widely patent. No high-grade neuroforaminal narrowing identified. Upper chest: Negative. Other: None IMPRESSION: 1. No acute intracranial abnormality. No calvarial fracture. 2. Progressive ventriculomegaly, disproportionate to the degree of parenchymal volume loss. While this may represent asymmetric  central atrophy, changes of normal pressure hydrocephalus could appear similarly. 3. No acute fracture or listhesis of the cervical spine. Electronically Signed   By: Fidela Salisbury M.D.   On: 04/18/2022 00:53   CT ABDOMEN PELVIS WO CONTRAST  Result Date: 04/17/2022 CLINICAL DATA:  Abdominal pain, acute, nonlocalized EXAM: CT ABDOMEN AND PELVIS WITHOUT CONTRAST TECHNIQUE: Multidetector CT imaging of the abdomen and pelvis was performed following  the standard protocol without IV contrast. RADIATION DOSE REDUCTION: This exam was performed according to the departmental dose-optimization program which includes automated exposure control, adjustment of the mA and/or kV according to patient size and/or use of iterative reconstruction technique. COMPARISON:  None Available. FINDINGS: Lower chest: No acute findings.  Small hiatal hernia. Hepatobiliary: No focal hepatic abnormality. Gallbladder unremarkable. Pancreas: No focal abnormality or ductal dilatation. Spleen: No focal abnormality.  Normal size. Adrenals/Urinary Tract: 2 mm nonobstructing stone in the lower pole of the left kidney. No ureteral stones or hydronephrosis. Adrenal glands and urinary bladder unremarkable. Stomach/Bowel: Normal appendix. Stomach, large and small bowel grossly unremarkable. Vascular/Lymphatic: Aortic atherosclerosis. No evidence of aneurysm or adenopathy. Reproductive: Uterus and adnexa unremarkable.  No mass. Other: No free fluid or free air. Musculoskeletal: No acute bony abnormality. IMPRESSION: No acute findings. Punctate left lower pole nephrolithiasis.  No hydronephrosis. Aortic atherosclerosis. Electronically Signed   By: Rolm Baptise M.D.   On: 04/17/2022 18:15    Labs:  CBC: Recent Labs    04/25/22 0453 04/26/22 0659 04/27/22 0402 04/28/22 0451  WBC 13.8* 12.2* 14.4* 13.7*  HGB 9.4* 10.4* 9.1* 9.4*  HCT 28.3* 31.4* 28.3* 28.9*  PLT 452* 518* 527* 574*    COAGS: Recent Labs    04/18/22 0044  INR 1.0     BMP: Recent Labs    04/25/22 0936 04/26/22 0659 04/27/22 0402 04/28/22 0451  NA 140 142 140 139  K 3.7 3.9 4.4 4.8  CL 111 107 106 103  CO2 23 25 26 26   GLUCOSE 127* 211* 205* 298*  BUN 31* 26* 29* 33*  CALCIUM 8.2* 8.8* 8.6* 8.7*  CREATININE 1.35* 1.27* 1.37* 1.53*  GFRNONAA 43* 46* 42* 37*    LIVER FUNCTION TESTS: Recent Labs    04/17/22 1445 04/19/22 0435 04/24/22 0529  BILITOT 1.8* 0.6 0.6  AST 29 21 18   ALT 13 7 9   ALKPHOS 52 40 87  PROT 8.4* 7.4 5.9*  ALBUMIN 3.8 3.3* 2.1*    Assessment and Plan:  Laurie Lin is a 68 year old female admitted with acute encephalopathy with a mental state that has not significantly improved during admission.  She has been unable to meet nutritional needs and is NG dependent at this time. Her team has requested G-tube placement.  Imaging reviewed and anatomy approved for percutaneous placement by Dr. Anselm Pancoast.  Vitals, labs, and meds reviewed.  Tube feeds to be held at midnight. Ancef ordered.   New INR ordered.  Will watch that WBC does not rise overnight.  Plan to proceed with placement on 04/29/22  Risks and benefits image guided gastrostomy tube placement was discussed with the spouse including, but not limited to the need for a barium enema during the procedure, bleeding, infection, peritonitis and/or damage to adjacent structures.  All of the spouse's questions were answered, and is agreeable to proceed.  Consent signed and in chart.   Thank you for this interesting consult.  I greatly enjoyed meeting Laurie Lin and look forward to participating in their care.  A copy of this report was sent to the requesting provider on this date.  Electronically Signed: Pasty Spillers, PA 04/28/2022, 3:13 PM   I spent a total of 40 Minutes in face to face in clinical consultation, greater than 50% of which was counseling/coordinating care for g-tube placement

## 2022-04-28 NOTE — Progress Notes (Signed)
       CROSS COVER NOTE  NAME: Laurie Lin MRN: [id number] DOB : June 01, 1955 ATTENDING PHYSICIAN: Lorella Nimrod, MD    Date of Service   04/28/2022   HPI/Events of Note   Pruritus  Interventions   Assessment/Plan: 12.5 mg Benadryl IV X X    *** professional thanks      To reach the provider On-Call:   7AM- 7PM see care teams to locate the attending and reach out to them via [email or web address]. 7PM-7AM contact night-coverage If you still have difficulty reaching the appropriate provider, please page the [facility] (Director on Call) for [facility] on amion for assistance  This document was prepared using Set designer software and may include unintentional dictation errors.  Neomia Glass DNP, MBA, FNP-BC Nurse Practitioner [facility] Pager [phone]

## 2022-04-28 NOTE — Progress Notes (Signed)
Speech Language Pathology Treatment:    Patient Details Name: Laurie Lin MRN: [id number] DOB: [birth year] Today's Date: 04/28/2022 Time: 1345-1400 SLP Time Calculation (min) (ACUTE ONLY): 15 min  Assessment / Plan / Recommendation Clinical Impression  Pt seen for dysphagia treatment. Pt alert, notably confused. Wet, hypophonic vocal quality appreciated. Concern for reduced secretion management given vocal quality and frequent throat clearing/coughing. R head tilt and gaze preference. B mitts donned for duration of tx. Cleared with RN. Husband present for tx.   Attempted oral care with foam toothette. Pt demonstrated orally aversive behaviors (e.g. pursing lips, pulling head away) despite education. Offered pt trials of ice chips. Pt again demonstrated orally aversive behaviors despite education/cueing. Pt opened mouth slightly x1 with tonic biting on spoon noted. Further PO trials deferred given pt's clinical presentation.   At present, a safe oral diet cannot be recommended. Recommend NPO with consideration for long term alternate route of nutrition/hydration/medication.   Pt, pt's husband, and RN made aware of results, recommendations, and SLP POC.   SLP to f/u per POC for ongoing dysphagia management.     HPI HPI: 67 year old female with past medical history of ETOH abuse, diabetes mellitus type 1, stage IIIa chronic kidney disease, hypertension and ongoing Alcohol use who was brought in for worsening confusion for the last month.  Workup revealed urinary tract infection, acute kidney injury, hypomagnesemia, hyperkalemia.and Wernicke's encephalopathy.  While on floor she continued to deteriorate and has become unresponsive with jerky movements. PCCM consultation for concern patient developing Delerium Tremens. Pt required oral intubation; intubated 01/01 thru 04/24/2022.   CXR on 04/25/22: New right perihilar airspace opacity suspicious for early  pneumonia.  2. Hazy accentuated density at the  right lung base with obscuration  of the right lateral hemidiaphragm, potentially from atelectasis or  pneumonia.  3. Airway thickening is present, suggesting bronchitis or reactive  airways disease.      SLP Plan  Continue with current plan of care      Recommendations for follow up therapy are one component of a multi-disciplinary discharge planning process, led by the attending physician.  Recommendations may be updated based on patient status, additional functional criteria and insurance authorization.    Recommendations  Diet recommendations: NPO (consideration for long term alternate route of nutrition/hydration/medication) Medication Administration: Via alternative means                Oral Care Recommendations: Oral care QID;Oral care before and after PO;Staff/trained caregiver to provide oral care Follow Up Recommendations: Skilled nursing-short term rehab (<3 hours/day) Assistance recommended at discharge: Frequent or constant Supervision/Assistance SLP Visit Diagnosis: Dysphagia, oropharyngeal phase (R13.12) Plan: Continue with current plan of care         Cherrie Gauze, M.S., [facility] [phone] [place])   Quintella Baton  04/28/2022, 2:03 PM

## 2022-04-29 ENCOUNTER — Inpatient Hospital Stay: Payer: Medicare Other | Admitting: Radiology

## 2022-04-29 DIAGNOSIS — G9340 Encephalopathy, unspecified: Secondary | ICD-10-CM | POA: Diagnosis not present

## 2022-04-29 HISTORY — PX: IR GASTROSTOMY TUBE MOD SED: IMG625

## 2022-04-29 LAB — GLUCOSE, CAPILLARY
Glucose-Capillary: 103 mg/dL — ABNORMAL HIGH (ref 70–99)
Glucose-Capillary: 130 mg/dL — ABNORMAL HIGH (ref 70–99)
Glucose-Capillary: 135 mg/dL — ABNORMAL HIGH (ref 70–99)
Glucose-Capillary: 136 mg/dL — ABNORMAL HIGH (ref 70–99)
Glucose-Capillary: 143 mg/dL — ABNORMAL HIGH (ref 70–99)
Glucose-Capillary: 144 mg/dL — ABNORMAL HIGH (ref 70–99)
Glucose-Capillary: 156 mg/dL — ABNORMAL HIGH (ref 70–99)

## 2022-04-29 LAB — CBC WITH DIFFERENTIAL/PLATELET
Abs Immature Granulocytes: 0.12 10*3/uL — ABNORMAL HIGH (ref 0.00–0.07)
Basophils Absolute: 0.1 10*3/uL (ref 0.0–0.1)
Basophils Relative: 1 %
Eosinophils Absolute: 0.9 10*3/uL — ABNORMAL HIGH (ref 0.0–0.5)
Eosinophils Relative: 6 %
HCT: 32.8 % — ABNORMAL LOW (ref 36.0–46.0)
Hemoglobin: 10.4 g/dL — ABNORMAL LOW (ref 12.0–15.0)
Immature Granulocytes: 1 %
Lymphocytes Relative: 12 %
Lymphs Abs: 1.7 10*3/uL (ref 0.7–4.0)
MCH: 32.5 pg (ref 26.0–34.0)
MCHC: 31.7 g/dL (ref 30.0–36.0)
MCV: 102.5 fL — ABNORMAL HIGH (ref 80.0–100.0)
Monocytes Absolute: 1.2 10*3/uL — ABNORMAL HIGH (ref 0.1–1.0)
Monocytes Relative: 8 %
Neutro Abs: 10.2 10*3/uL — ABNORMAL HIGH (ref 1.7–7.7)
Neutrophils Relative %: 72 %
Platelets: 646 10*3/uL — ABNORMAL HIGH (ref 150–400)
RBC: 3.2 MIL/uL — ABNORMAL LOW (ref 3.87–5.11)
RDW: 13 % (ref 11.5–15.5)
WBC: 14.2 10*3/uL — ABNORMAL HIGH (ref 4.0–10.5)
nRBC: 0 % (ref 0.0–0.2)

## 2022-04-29 LAB — BLOOD GAS, ARTERIAL
Acid-base deficit: 1.7 mmol/L (ref 0.0–2.0)
Bicarbonate: 21.9 mmol/L (ref 20.0–28.0)
FIO2: 60 %
MECHVT: 500 mL
Mechanical Rate: 12
O2 Saturation: 99.5 %
PEEP: 5 cmH2O
Patient temperature: 37
pCO2 arterial: 33 mmHg (ref 32–48)
pH, Arterial: 7.43 (ref 7.35–7.45)
pO2, Arterial: 218 mmHg — ABNORMAL HIGH (ref 83–108)

## 2022-04-29 LAB — PROTIME-INR
INR: 1 (ref 0.8–1.2)
Prothrombin Time: 13.2 seconds (ref 11.4–15.2)

## 2022-04-29 MED ORDER — IOHEXOL 300 MG/ML  SOLN
15.0000 mL | Freq: Once | INTRAMUSCULAR | Status: AC | PRN
  Administered 2022-04-29: 15 mL

## 2022-04-29 MED ORDER — DEXTROSE IN LACTATED RINGERS 5 % IV SOLN: INTRAVENOUS | Status: DC

## 2022-04-29 MED ORDER — MIDAZOLAM HCL 5 MG/5ML IJ SOLN
INTRAMUSCULAR | Status: AC | PRN
  Administered 2022-04-29: 1 mg via INTRAVENOUS

## 2022-04-29 MED ORDER — FENTANYL CITRATE (PF) 100 MCG/2ML IJ SOLN
INTRAMUSCULAR | Status: AC
  Filled 2022-04-29: qty 2

## 2022-04-29 MED ORDER — FENTANYL CITRATE (PF) 100 MCG/2ML IJ SOLN
INTRAMUSCULAR | Status: AC | PRN
  Administered 2022-04-29 (×2): 25 ug via INTRAVENOUS

## 2022-04-29 MED ORDER — MORPHINE SULFATE (PF) 2 MG/ML IV SOLN
1.0000 mg | Freq: Once | INTRAVENOUS | Status: AC
  Administered 2022-04-29: 1 mg via INTRAVENOUS
  Filled 2022-04-29: qty 1

## 2022-04-29 MED ORDER — GLUCAGON HCL RDNA (DIAGNOSTIC) 1 MG IJ SOLR
INTRAMUSCULAR | Status: AC
  Filled 2022-04-29: qty 1

## 2022-04-29 MED ORDER — CEFAZOLIN SODIUM-DEXTROSE 2-4 GM/100ML-% IV SOLN
INTRAVENOUS | Status: AC | PRN
  Administered 2022-04-29: 2 g via INTRAVENOUS

## 2022-04-29 MED ORDER — CEFAZOLIN SODIUM-DEXTROSE 2-4 GM/100ML-% IV SOLN
INTRAVENOUS | Status: AC
  Filled 2022-04-29: qty 100

## 2022-04-29 MED ORDER — PROSOURCE TF20 ENFIT COMPATIBL EN LIQD
60.0000 mL | Freq: Every day | ENTERAL | Status: DC
  Administered 2022-05-01 – 2022-05-05 (×5): 60 mL

## 2022-04-29 MED ORDER — LIDOCAINE HCL 1 % IJ SOLN
INTRAMUSCULAR | Status: AC
  Administered 2022-04-29: 20 mL
  Filled 2022-04-29: qty 20

## 2022-04-29 MED ORDER — ONDANSETRON HCL 4 MG/2ML IJ SOLN
INTRAMUSCULAR | Status: AC
  Filled 2022-04-29: qty 2

## 2022-04-29 MED ORDER — LACTATED RINGERS IV SOLN: INTRAVENOUS | Status: DC

## 2022-04-29 MED ORDER — MIDAZOLAM HCL 2 MG/2ML IJ SOLN
INTRAMUSCULAR | Status: AC
  Filled 2022-04-29: qty 2

## 2022-04-29 MED ORDER — FREE WATER
140.0000 mL | Status: DC
  Administered 2022-04-30 – 2022-05-06 (×35): 140 mL

## 2022-04-29 MED ORDER — ONDANSETRON HCL 4 MG/2ML IJ SOLN
INTRAMUSCULAR | Status: AC | PRN
  Administered 2022-04-29: 4 mg via INTRAVENOUS

## 2022-04-29 MED ORDER — GLUCAGON HCL RDNA (DIAGNOSTIC) 1 MG IJ SOLR
INTRAMUSCULAR | Status: AC | PRN
  Administered 2022-04-29: .5 mg via INTRAVENOUS

## 2022-04-29 MED ORDER — OSMOLITE 1.5 CAL PO LIQD: 1000.0000 mL | ORAL | Status: DC

## 2022-04-29 NOTE — Progress Notes (Signed)
Patient's husband [name] notified, per request, that patient was just taken to IR for g-tube placement.

## 2022-04-29 NOTE — Procedures (Signed)
Vascular and Interventional Radiology Procedure Note  Patient: Laurie Lin DOB: 1956-01-25 Medical Record Number: [id number] Note Date/Time: 04/29/22 11:01 AM   Performing Physician: Michaelle Birks, MD Assistant(s): None  Diagnosis: Dysphagia  Procedure: PERCUTANEOUS GASTROSTOMY TUBE PLACEMENT  Anesthesia: Conscious Sedation Complications: None Estimated Blood Loss: Minimal  Findings:  Successful placement of a  73 F gastrostomy tube under fluoroscopy.   See detailed procedure note with images in PACS. The patient tolerated the procedure well without incident or complication and was returned to ICU in stable condition.    Michaelle Birks, MD [facility]   Pager. [place]

## 2022-04-29 NOTE — Progress Notes (Signed)
Patient clinically stable post Peg tube placement per Dr Maryelizabeth Kaufmann, tolerated well. Vitals stable pre and post procedure. Received Versed 1 mg along with Fentanyl 50 mcg IV for procedure. Emesis pre procedure with dht to suction as well as zofran 4 mg given iv pre procedure. Glucagon 0.5 mg iv given for procedure. Report given to care nurse 125/Terry Brogan RN/330 post procedure. To be NPO per PEG for 6 hours then meds per Dr Maryelizabeth Kaufmann and may use PEG for feeds after 24 hours.

## 2022-04-29 NOTE — Progress Notes (Signed)
Nutrition Follow-up  DOCUMENTATION CODES:   Not applicable  INTERVENTION:   -Once g-tube is cleared for use:   Initiate Osmolite 1.5 @ 20 ml/hr and increase by 10 ml every 4 hours to goal rate of 60 ml/hr.   60 ml Prosource TF daily  140 ml free water flush every 4 hours   Tube feeding regimen provides 2240 kcal (100% of needs), 110 grams of protein, and 1097 ml of H2O. Total free water: 10937 ml daily    NUTRITION DIAGNOSIS:   Inadequate oral intake related to inability to eat (pt sedated and ventilated) as evidenced by NPO status.  Ongoing  GOAL:   Patient will meet greater than or equal to 90% of their needs  Progressing  MONITOR:   Diet advancement, Labs, Weight trends, TF tolerance, Skin, I & O's  REASON FOR ASSESSMENT:   Consult Enteral/tube feeding initiation and management  ASSESSMENT:   67 y/o female with h/o IDDM, HTN, etoh abuse, GERD, hital hernia, neuropathy, cirrhosis, varices and CKD III who is admitted with encephalopathy, UTI and possible Wernickes.  1/8- s/p BSE- recommend continued NPO 1/9- s/p g-tube placement  Reviewed I/O's: +234 ml x 24 hours and +14.3 L since admission  UOP: 2.2 L x 24 hours   Pt out of room at time of visit.   Spoke with RN and MD. Plan for g-tube placement today. NGT to be removed. Per RN, plan to resume TF tomorrow. Tf held yesterday secondary to vomiting.   Medications reviewed and include folic acid, reglan,  thiamine, and dextrose 5% iu lactated ringers infusion @ 100 ml/hr.   Labs reviewed: CBGS: 136-156 (inpatient orders for glycemic control are 0-20 units insulin aspart every 4 hours and 12 units insulin glargine-yfgn daily).    Diet Order:   Diet Order             Diet NPO time specified  Diet effective now                   EDUCATION NEEDS:   No education needs have been identified at this time  Skin:  Skin Assessment: Skin Integrity Issues: Skin Integrity Issues:: Other (Comment) Other:  skin tear to lt hand  Last BM:  04/29/22 (type 7- via rectal tube)  Height:   Ht Readings from Last 1 Encounters:  04/29/22 5\' 10"  (1.778 m)    Weight:   Wt Readings from Last 1 Encounters:  04/29/22 86.6 kg    Ideal Body Weight:  75.45 kg  BMI:  Body mass index is 27.39 kg/m.  Estimated Nutritional Needs:   Kcal:  1900-2200kcal/day  Protein:  95-110g/day  Fluid:  > 1.9 L    Loistine Chance, RD, LDN, Eugenio Saenz Registered Dietitian II Certified Diabetes Care and Education Specialist Please refer to [facility] for RD and/or RD on-call/weekend/after hours pager

## 2022-04-29 NOTE — Progress Notes (Signed)
Patient send to IR  in stable condition.

## 2022-04-29 NOTE — Progress Notes (Signed)
Patient alert, oriented x1, patient stated her name the morning. Patient had brown with mucus emesis this morning, scheduled IV  reglan given. Will continue to monitor.

## 2022-04-29 NOTE — Progress Notes (Signed)
PROGRESS NOTE Laurie Lin  FGH:[id number] DOB: [birth year] DOA: 04/17/2022 PCP: Pcp, No   Brief Narrative/Hospital Course: 67yof w/ diabetes mellitus type 1, stage IIIa chronic kidney disease, hypertension and ongoing alcohol use who was brought in for worsening confusion for the last month.  Workup revealed urinary tract infection, acute kidney injury, hypomagnesemia and hyperkalemia.  MRI unremarkable for CVA. On evening of 12/30, patient started becoming slightly more agitated requiring Ativan.  As 12/31 progressed, patient became even more agitated despite Ativan.  Discussed with critical care.  Patient transferred to critical care service to the ICU and started on Precedex drip.  With worsening agitation, patient intubated on 1/1.  By 1/3, able to be weaned off of Precedex and able to be extubated on 1/4.  Patient also with Klebsiella UTI, treated with course of Rocephin.  [facility] assumed care 1/5 and patient started on IV antibiotics for aspiration pneumonia.  Mentation waxing and waning and unable to clear swallow evaluation.  Patient currently on tube feeds.further discussion was had w/ family and proceeding with PEG tube placement.  1/8: Mild tachycardia and low 100s, labs with improvement in leukocytosis.  BMP with slight worsening of creatinine to 1.53.  Increasing free water intake through NG tube.  CBG remained elevated, adding NovoLog 7 unit every 4 hourly if continued to receive tube feeding, discontinued later as patient developed some hypoglycemia. Patient had some nausea and vomiting so tube feed is currently being held. Going for PEG tube placement tomorrow.  1/9: Patient had her PEG tube placed today by IR.  Remained quite encephalopathic and not following any commands.  She did received high-dose thiamine for concern of Wernicke's encephalopathy during current hospitalization.  Imaging was negative.  Did received multiple courses of antibiotics for concern of bronchitis  followed by UTI followed by aspiration pneumonia. Consulted neurology for persistent encephalopathy. PEG tube will be ready for medications around 6 PM and to start feed tomorrow morning.     Subjective: Patient was seen after the PEG tube placement, remained very lethargic and encephalopathic.  Just open eyes and track with them.  Not following any other commands.   Assessment and Plan: Principal Problem:   Encephalopathy acute Active Problems:   Aspiration pneumonia (HCC)   Acute renal failure superimposed on stage 3a chronic kidney disease (HCC)   Type 1 diabetes mellitus with hyperglycemia (HCC)   Alcohol abuse   Essential hypertension   GERD without esophagitis   Overweight (BMI 25.0-29.9)   Acute encephalopathy Alcohol abuse with withdrawal and delirium tremens: Acute encephalopathy multifactorial suspecting metabolic also in the setting of chronic alcohol abuse, due to critical illness.MRI ruled out CVA, but does note significant wear and tear disproportionate for patient's age. She had decompensated requiring Precedex drip and mechanical intubation> subsequently weaned off Precedex and extubated 1/4. Now more appropriate still with weak and confusion answer some questions, participating with PT OT saw him.PT and OT recommending CIR, but patient not felt to be good candidate for CIR given waxing waning mental status, so would need skilled nursing. At this time, it is unclear how much of her mentation issues are going to be long-term.  By treating underlying infection, taking out of ICU and minimizing delirium, monitoring for over sedating medications and improving nutrition, hopefully she will improve. Previous MD discussed with the patient's husband and he understands.  If patient not improved, husband is interested in a long-term care facility. Patient also received high-dose thiamine for concern of Wernicke's. -Neurology consult due  to persistent encephalopathy.  Aspiration  pneumonia: Patient on empiric antibiotics with cefepime currently> change to unasyn.  Currently has mild leukocytosis Pro-Cal is 0.37.  She is doing well on room air.  Previously she had low-grade temperature along with leukocytosis and increased procalcitonin chest x-ray within infiltrate suspicious for aspiration pneumonia  AKI on CKD stage IIIa, improved, off IV fluids continue tube feeding  Recent Labs  Lab 04/25/22 0453 04/25/22 0936 04/26/22 0659 04/27/22 0402 04/28/22 0451  BUN 33* 31* 26* 29* 33*  CREATININE 1.45* 1.35* 1.27* 1.37* 1.53*     Alcohol abuse with withdrawal:patient was drinking 7-8 beers per day and has not had any alcohol in at least several days before coming in.  Patient is through withdrawal continue supportive care thiamine vitamins   Type 1 diabetes mellitus with uncontrolled hyperglycemia: Blood sugar fairly stable.  Seen by diabetes coordinator.  On Semglee 12 units, also on Reglan Recent Labs  Lab 04/28/22 2015 04/29/22 0030 04/29/22 0420 04/29/22 0752 04/29/22 1226  GLUCAP 131* 135* 130* 136* 156*     Essential hypertension: BP stable.  On scheduled IV Lopressor given heart rate staying in the 90s and blood pressures ranging from the 130s to 170s.Once PEG tube in place, can change to p.o. beta-blocker  GERD continue PPI   FEN: Patient with poor oral intake,  PEG tube was placed today and ready to use for feeding tomorrow morning.  Bronchitis-resolved as of 04/25/2022 Completed course of antibiotics to Rocephin and Zithromax   UTI due to Klebsiella species-resolved as of 04/25/2022 Urine cultures growing out pansensitive Klebsiella.  Completed course of Rocephin   Acute respiratory failure with hypoxia (HCC)-resolved as of 04/25/2022 Secondary to agitation.  Required mechanical intubation.   Hypernatremia-resolved as of 04/25/2022 Secondary to lack of p.o. intake while she is confused.    DVT prophylaxis: enoxaparin (LOVENOX) injection 40 mg Start:  04/19/22 1000 Code Status:   Code Status: Full Code Family Communication: Discussed with husband on phone.  Patient status is: inpatient  because of ongoing altered mental status Level of care: Telemetry Medical   Dispo: The patient is from: HOME             Anticipated disposition: snf Objective: Vitals last 24 hrs: Vitals:   04/29/22 1115 04/29/22 1130 04/29/22 1145 04/29/22 1200  BP: 130/72 (!) 128/59 130/62 132/67  Pulse: 87 88  88  Resp: 18 17 15 16   Temp:    97.9 F (36.6 C)  TempSrc:    Oral  SpO2: 95% 96%  96%  Weight:      Height:       Weight change:   Physical Examination: General.  Lethargic lady, in no acute distress. Pulmonary.  Lungs clear bilaterally, normal respiratory effort. CV.  Regular rate and rhythm, no JVD, rub or murmur. Abdomen.  Soft, nontender, nondistended, BS positive. CNS.  Lethargic, not following any commands Extremities.  No edema, no cyanosis, pulses intact and symmetrical. Psychiatry.  Judgment and insight appears impaired  Medications reviewed:  Scheduled Meds:  Chlorhexidine Gluconate Cloth  6 each Topical Daily   enoxaparin (LOVENOX) injection  40 mg Subcutaneous Q24H   [START ON 05/01/2022] feeding supplement (PROSource TF20)  60 mL Per Tube Daily   folic acid  1 mg Intravenous Daily   [START ON 04/30/2022] free water  140 mL Per Tube Q4H   insulin aspart  0-20 Units Subcutaneous Q4H   insulin glargine-yfgn  12 Units Subcutaneous Daily   metoCLOPramide (REGLAN) injection  5 mg Intravenous Q8H   metoprolol tartrate  5 mg Intravenous Q6H   mouth rinse  15 mL Mouth Rinse 4 times per day   thiamine (VITAMIN B1) injection  100 mg Intravenous Daily   Continuous Infusions:  sodium chloride 10 mL/hr at 04/28/22 2000   ampicillin-sulbactam (UNASYN) IV 3 g (04/29/22 1516)   dextrose 5% lactated ringers 100 mL/hr at 04/29/22 1322   [START ON 04/30/2022] feeding supplement (OSMOLITE 1.5 CAL)        Diet Order             Diet NPO  time specified  Diet effective now                    Nutrition Problem: Inadequate oral intake Etiology: inability to eat (pt sedated and ventilated) Signs/Symptoms: NPO status     Intake/Output Summary (Last 24 hours) at 04/29/2022 1542 Last data filed at 04/29/2022 1230 Gross per 24 hour  Intake 2835.79 ml  Output 2100 ml  Net 735.79 ml    Net IO Since Admission: 13,976.08 mL [04/29/22 1542]  Wt Readings from Last 3 Encounters:  04/29/22 86.6 kg  08/22/20 92.5 kg  10/28/19 83.9 kg     Unresulted Labs (From admission, onward)    None     Data Reviewed: I have personally reviewed following labs and imaging studies CBC: Recent Labs  Lab 04/25/22 0453 04/26/22 0659 04/27/22 0402 04/28/22 0451 04/29/22 0559  WBC 13.8* 12.2* 14.4* 13.7* 14.2*  NEUTROABS  --   --   --   --  10.2*  HGB 9.4* 10.4* 9.1* 9.4* 10.4*  HCT 28.3* 31.4* 28.3* 28.9* 32.8*  MCV 99.0 98.7 100.4* 100.3* 102.5*  PLT 452* 518* 527* 574* 646*    Basic Metabolic Panel: Recent Labs  Lab 04/23/22 0406 04/23/22 1435 04/24/22 0529 04/24/22 1227 04/25/22 0453 04/25/22 0936 04/26/22 0659 04/27/22 0402 04/28/22 0451  NA 134*   < > 138   < > 142 140 142 140 139  K 2.8*   < > 3.4*   < > 3.8 3.7 3.9 4.4 4.8  CL 108   < > 114*   < > 112* 111 107 106 103  CO2 16*   < > 18*   < > 23 23 25 26 26   GLUCOSE 165*   < > 163*   < > 127* 127* 211* 205* 298*  BUN 25*   < > 33*   < > 33* 31* 26* 29* 33*  CREATININE 1.17*   < > 1.60*   < > 1.45* 1.35* 1.27* 1.37* 1.53*  CALCIUM 8.0*   < > 8.0*   < > 8.2* 8.2* 8.8* 8.6* 8.7*  MG 2.0  --  1.8  --  1.6*  --  1.8 2.0  --   PHOS 2.3*  --  2.2*  --  3.6  --  2.5  --   --    < > = values in this interval not displayed.    GFR: Estimated Creatinine Clearance: 42.6 mL/min (A) (by C-G formula based on SCr of 1.53 mg/dL (H)). Liver Function Tests: Recent Labs  Lab 04/24/22 0529  AST 18  ALT 9  ALKPHOS 87  BILITOT 0.6  PROT 5.9*  ALBUMIN 2.1*    No  results for input(s): "LIPASE", "AMYLASE" in the last 168 hours. No results for input(s): "AMMONIA" in the last 168 hours. CBG: Recent Labs  Lab 04/28/22 2015 04/29/22 0030 04/29/22 0420 04/29/22  0752 04/29/22 1226  GLUCAP 131* 135* 130* 136* 156*     Recent Labs  Lab 04/25/22 0936 04/26/22 0659 04/27/22 0402  PROCALCITON 0.67 0.44 0.37     Recent Results (from the past 240 hour(s))  MRSA Next Gen by PCR, Nasal     Status: None   Collection Time: 04/20/22  4:52 PM   Specimen: Nasal Mucosa; Nasal Swab  Result Value Ref Range Status   MRSA by PCR Next Gen NOT DETECTED NOT DETECTED Final    Comment: (NOTE) The GeneXpert MRSA Assay (FDA approved for NASAL specimens only), is one component of a comprehensive MRSA colonization surveillance program. It is not intended to diagnose MRSA infection nor to guide or monitor treatment for MRSA infections. Test performance is not FDA approved in patients less than 27 years old. Performed at [facility], [address] [place], [place] [address]   Culture, blood (Routine X 2) w Reflex to ID Panel     Status: None   Collection Time: 04/20/22  5:25 PM   Specimen: BLOOD  Result Value Ref Range Status   Specimen Description BLOOD BLOOD LEFT ARM  Final   Special Requests   Final    BOTTLES DRAWN AEROBIC AND ANAEROBIC Blood Culture adequate volume   Culture   Final    NO GROWTH 5 DAYS Performed at [facility], [address], [place], [name] [address]    Report Status 04/25/2022 FINAL  Final  Culture, blood (Routine X 2) w Reflex to ID Panel     Status: None   Collection Time: 04/20/22  5:33 PM   Specimen: BLOOD  Result Value Ref Range Status   Specimen Description BLOOD BLOOD RIGHT HAND  Final   Special Requests   Final    BOTTLES DRAWN AEROBIC AND ANAEROBIC Blood Culture adequate volume   Culture   Final    NO GROWTH 5 DAYS Performed at [facility], [address] [place], [place] [address]     Report Status 04/25/2022 FINAL  Final  Resp panel by RT-PCR (RSV, Flu A&B, Covid) Anterior Nasal Swab     Status: None   Collection Time: 04/21/22 10:22 PM   Specimen: Anterior Nasal Swab  Result Value Ref Range Status   SARS Coronavirus 2 by RT PCR NEGATIVE NEGATIVE Final    Comment: (NOTE) SARS-CoV-2 target nucleic acids are NOT DETECTED.  The SARS-CoV-2 RNA is generally detectable in upper respiratory specimens during the acute phase of infection. The lowest concentration of SARS-CoV-2 viral copies this assay can detect is 138 copies/mL. A negative result does not preclude SARS-Cov-2 infection and should not be used as the sole basis for treatment or other patient management decisions. A negative result may occur with  improper specimen collection/handling, submission of specimen other than nasopharyngeal swab, presence of viral mutation(s) within the areas targeted by this assay, and inadequate number of viral copies(<138 copies/mL). A negative result must be combined with clinical observations, patient history, and epidemiological information. The expected result is Negative.  Fact Sheet for Patients:  EntrepreneurPulse.com.au  Fact Sheet for Healthcare Providers:  IncredibleEmployment.be  This test is no t yet approved or cleared by the [place] FDA and  has been authorized for detection and/or diagnosis of SARS-CoV-2 by FDA under an Emergency Use Authorization (EUA). This EUA will remain  in effect (meaning this test can be used) for the duration of the COVID-19 declaration under Section 564(b)(1) of the Act, 21 U.S.C.section 360bbb-3(b)(1), unless the  authorization is terminated  or revoked sooner.       Influenza A by PCR NEGATIVE NEGATIVE Final   Influenza B by PCR NEGATIVE NEGATIVE Final    Comment: (NOTE) The Xpert Xpress SARS-CoV-2/FLU/RSV plus assay is intended as an aid in the diagnosis of influenza from  Nasopharyngeal swab specimens and should not be used as a sole basis for treatment. Nasal washings and aspirates are unacceptable for Xpert Xpress SARS-CoV-2/FLU/RSV testing.  Fact Sheet for Patients: EntrepreneurPulse.com.au  Fact Sheet for Healthcare Providers: IncredibleEmployment.be  This test is not yet approved or cleared by the [place] FDA and has been authorized for detection and/or diagnosis of SARS-CoV-2 by FDA under an Emergency Use Authorization (EUA). This EUA will remain in effect (meaning this test can be used) for the duration of the COVID-19 declaration under Section 564(b)(1) of the Act, 21 U.S.C. section 360bbb-3(b)(1), unless the authorization is terminated or revoked.     Resp Syncytial Virus by PCR NEGATIVE NEGATIVE Final    Comment: (NOTE) Fact Sheet for Patients: EntrepreneurPulse.com.au  Fact Sheet for Healthcare Providers: IncredibleEmployment.be  This test is not yet approved or cleared by the [place] FDA and has been authorized for detection and/or diagnosis of SARS-CoV-2 by FDA under an Emergency Use Authorization (EUA). This EUA will remain in effect (meaning this test can be used) for the duration of the COVID-19 declaration under Section 564(b)(1) of the Act, 21 U.S.C. section 360bbb-3(b)(1), unless the authorization is terminated or revoked.  Performed at [facility], [address] [place], [place] [address]   Culture, blood (Routine X 2) w Reflex to ID Panel     Status: None   Collection Time: 04/23/22  3:20 PM   Specimen: BLOOD  Result Value Ref Range Status   Specimen Description BLOOD  Final   Special Requests BOTTLES DRAWN AEROBIC AND ANAEROBIC BCLV  Final   Culture   Final    NO GROWTH 5 DAYS Performed at [facility], [address], [place], [place] [address]    Report Status 04/28/2022 FINAL  Final  Culture, blood (Routine  X 2) w Reflex to ID Panel     Status: None   Collection Time: 04/23/22  3:23 PM   Specimen: BLOOD  Result Value Ref Range Status   Specimen Description BLOOD BLOOD LEFT HAND  Final   Special Requests BOTTLES DRAWN AEROBIC AND ANAEROBIC BCAV  Final   Culture   Final    NO GROWTH 5 DAYS Performed at [facility], [address] [place], [place] [address]    Report Status 04/28/2022 FINAL  Final  Culture, Respiratory w Gram Stain     Status: None   Collection Time: 04/23/22  3:50 PM  Result Value Ref Range Status   Specimen Description   Final    EXPECTORATED SPUTUM Performed at [facility], [address], [place], [place] [address]    Special Requests   Final    NONE Performed at [facility], [address] [place], [place] [address]    Gram Stain   Final    MODERATE WBC PRESENT, PREDOMINANTLY PMN NO ORGANISMS SEEN    Culture   Final    Normal respiratory flora-no Staph aureus or Pseudomonas seen Performed at [facility] [address], [place], [place] [address]    Report Status 04/26/2022 FINAL  Final    Antimicrobials: Anti-infectives (From admission, onward)    Start     Dose/Rate Route  Frequency Ordered Stop   04/29/22 1020  ceFAZolin (ANCEF) IVPB 2g/100 mL premix        over 30 Minutes  Continuous PRN 04/29/22 1032 04/29/22 1020   04/29/22 0000  ceFAZolin (ANCEF) IVPB 2g/100 mL premix        2 g 200 mL/hr over 30 Minutes Intravenous To Radiology 04/28/22 1640 04/29/22 0110   04/27/22 1400  Ampicillin-Sulbactam (UNASYN) 3 g in sodium chloride 0.9 % 100 mL IVPB        3 g 200 mL/hr over 30 Minutes Intravenous Every 6 hours 04/27/22 1143     04/25/22 1515  ceFEPIme (MAXIPIME) 2 g in sodium chloride 0.9 % 100 mL IVPB  Status:  Discontinued        2 g 200 mL/hr over 30 Minutes Intravenous Every 12 hours 04/25/22 1418 04/27/22 1143   04/19/22 1815  cefTRIAXone (ROCEPHIN) 1 g in sodium chloride 0.9 % 100 mL IVPB        1 g 200  mL/hr over 30 Minutes Intravenous Every 24 hours 04/19/22 1715 04/23/22 1900   04/19/22 1815  azithromycin (ZITHROMAX) 500 mg in sodium chloride 0.9 % 250 mL IVPB  Status:  Discontinued        500 mg 250 mL/hr over 60 Minutes Intravenous Every 24 hours 04/19/22 1715 04/23/22 1434   04/18/22 1500  doxycycline (VIBRAMYCIN) 100 mg in sodium chloride 0.9 % 250 mL IVPB  Status:  Discontinued        100 mg 125 mL/hr over 120 Minutes Intravenous Every 12 hours 04/18/22 1344 04/19/22 1715      Culture/Microbiology    Component Value Date/Time   SDES  04/23/2022 1550    EXPECTORATED SPUTUM Performed at [facility], [address] [place], [place] [address]    [facility]  04/23/2022 1550    NONE Performed at [facility], [address], [place], [place] [address]    CULT  04/23/2022 1550    Normal respiratory flora-no Staph aureus or Pseudomonas seen Performed at [facility] [place] [address] [place], [place] [address]    REPTSTATUS 04/26/2022 FINAL 04/23/2022 1550  Radiology Studies: IR GASTROSTOMY TUBE MOD SED  Result Date: 04/29/2022 INDICATION: Dysphagia. EXAM: PERCUTANEOUS GASTROSTOMY TUBE PLACEMENT COMPARISON:  KUB, 04/26/2022.  CT AP, 04/17/2022. MEDICATIONS: Ancef 2 gm IV; Antibiotics were administered within 1 hour of the procedure. Zofran 4 mg IV and glucagon 0.5 mg IV CONTRAST:  15 mL of Isovue 300 administered into the gastric lumen. ANESTHESIA/SEDATION: Moderate (conscious) sedation was employed during this procedure. A total of Versed 1 mg and Fentanyl 50 mcg was administered intravenously. Moderate Sedation Time: 12 minutes. The patient's level of consciousness and vital signs were monitored continuously by radiology nursing throughout the procedure under my direct supervision. FLUOROSCOPY TIME:  Fluoroscopic dose; 09.4 mGy COMPLICATIONS: None immediate. PROCEDURE: Informed written consent was obtained from the patient and/or patient's representative  following explanation of the procedure, risks, benefits and alternatives. A time out was performed prior to the initiation of the procedure. Maximal barrier sterile technique utilized including caps, mask, sterile gowns, sterile gloves, large sterile drape, hand hygiene and Betadine prep. The LEFT upper quadrant was sterilely prepped and draped. A oral gastric catheter was inserted into the stomach under fluoroscopy. The existing nasogastric feeding tube was removed. The left costal margin and barium opacified transverse colon were identified and avoided. Air was injected into the stomach for insufflation and visualization under fluoroscopy. Under sterile conditions and local anesthesia,  2 T tacks were utilized to pexy the anterior aspect of the stomach against the ventral abdominal wall. Contrast injection confirmed appropriate positioning of each of the T tacks. An incision was made between the T tacks and a 17 gauge trocar needle was utilized to access the stomach. Needle position was confirmed within the stomach with aspiration of air and injection of a small amount of contrast. A stiff Glidewire was advanced into the gastric lumen and under intermittent fluoroscopic guidance, the access needle was exchanged for a Kumpe catheter. With the use of the Kumpe catheter, a stiff Glidewire was advanced into the horizontal segment of the duodenum. Under intermittent fluoroscopic guidance, the Kumpe catheter was exchanged for a telescoping peel-away sheath, ultimately allowing placement of a 18 Fr balloon retention gastrostomy tube. The retention balloon was insufflated with a mixture of dilute saline and contrast and pulled taut against the anterior wall of the stomach. The external disc was cinched. Contrast injection confirms positioning within the stomach. Several spot radiographic images were obtained in various obliquities for documentation. T he patient tolerated procedure well without immediate post procedural  complication. FINDINGS: After successful fluoroscopic guided placement, the gastrostomy tube is appropriately positioned with internal retention balloon against the ventral aspect of the gastric lumen. IMPRESSION: Successful placement of a 18 Fr balloon-retention gastrostomy tube. The gastrostomy may be used for medication administration within 6 hours placement, and initiation of tube feeds in 24 hrs. PLAN: Patient will return to V[facility]iology (VIR) for routine feeding tube evaluation and exchange in 6 months. Michaelle Birks, MD [facility] Electronically Signed   By: Michaelle Birks M.D.   On: 04/29/2022 12:05     LOS: 11 days   Lorella Nimrod, MD [facility]  04/29/2022, 3:42 PM

## 2022-04-29 NOTE — Progress Notes (Signed)
Patient returned to floor in stable condition. Patient awake and alert. 18 french g-tube site intact. No active bleeding noted.

## 2022-04-30 DIAGNOSIS — G9340 Encephalopathy, unspecified: Secondary | ICD-10-CM | POA: Diagnosis not present

## 2022-04-30 LAB — GLUCOSE, CAPILLARY
Glucose-Capillary: 132 mg/dL — ABNORMAL HIGH (ref 70–99)
Glucose-Capillary: 172 mg/dL — ABNORMAL HIGH (ref 70–99)
Glucose-Capillary: 184 mg/dL — ABNORMAL HIGH (ref 70–99)
Glucose-Capillary: 182 mg/dL — ABNORMAL HIGH (ref 70–99)
Glucose-Capillary: 162 mg/dL — ABNORMAL HIGH (ref 70–99)
Glucose-Capillary: 195 mg/dL — ABNORMAL HIGH (ref 70–99)

## 2022-04-30 MED ORDER — OSMOLITE 1.5 CAL PO LIQD
1000.0000 mL | ORAL | Status: DC
  Administered 2022-04-30 – 2022-05-04 (×3): 1000 mL

## 2022-04-30 NOTE — Progress Notes (Signed)
Progress Note   Patient: Laurie Lin JIR:[id number] DOB: 1956-04-09 DOA: 04/17/2022     12 DOS: the patient was seen and examined on 04/30/2022   Brief hospital course: 12yof w/ diabetes mellitus type 1, stage IIIa chronic kidney disease, hypertension and ongoing alcohol use who was brought in for worsening confusion for the last month.  Workup revealed urinary tract infection, acute kidney injury, hypomagnesemia and hyperkalemia.  MRI unremarkable for CVA. On evening of 12/30, patient started becoming slightly more agitated requiring Ativan.  As 12/31 progressed, patient became even more agitated despite Ativan.  Discussed with critical care.  Patient transferred to critical care service to the ICU and started on Precedex drip.  With worsening agitation, patient intubated on 1/1.  By 1/3, able to be weaned off of Precedex and able to be extubated on 1/4.  Patient also with Klebsiella UTI, treated with course of Rocephin.  [facility] assumed care 1/5 and patient started on IV antibiotics for aspiration pneumonia.  Mentation waxing and waning and unable to clear swallow evaluation.  Patient currently on tube feeds.further discussion was had w/ family and proceeding with PEG tube placement.  1/8: Mild tachycardia and low 100s, labs with improvement in leukocytosis.  BMP with slight worsening of creatinine to 1.53.  Increasing free water intake through NG tube.  CBG remained elevated, adding NovoLog 7 unit every 4 hourly if continued to receive tube feeding, discontinued later as patient developed some hypoglycemia. Patient had some nausea and vomiting so tube feed is currently being held. Going for PEG tube placement tomorrow.  1/9: Patient had her PEG tube placed today by IR.  Remained quite encephalopathic and not following any commands.  She did received high-dose thiamine for concern of Wernicke's encephalopathy during current hospitalization.  Imaging was negative.  Did received multiple  courses of antibiotics for concern of bronchitis followed by UTI followed by aspiration pneumonia. Consulted neurology for persistent encephalopathy. PEG tube will be ready for medications around 6 PM and to start feed tomorrow morning.  1/10: Patient more alert and able to communicate to some extent.  Not following commands very consistently.  Still unable to void after removing Foley catheter yesterday afternoon.  Requiring in and out catheter x 1 overnight and the second time this morning.  We will try to encourage voiding and if still unable to void then might need long term Foley catheter. Tube feeding was resumed through PEG tube today. Concern of Wernicke's/Korsakoff syndrome, might have some irreversible damage due to excessive alcohol use.  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  Neurology is recommending checking B12 and to keep it above 400.  They also ordered routine EEG although there was no underlying seizure-like activity noted. Ordered a.m. labs with anemia panel  Assessment and Plan: * Encephalopathy acute MRI ruled out CVA, but does note significant wear and tear disproportionate for patient's age.  Discussed with the patient's husband and he understands.  If patient not improved, husband is interested in a long-term care facility.  Decompensated requiring Precedex drip and mechanical intubation.  Patient able to be weaned off Precedex and extubated 1/4.  She is now more appropriate and still weak and still  confused, but answering some questions.  PT and OT recommending CIR, but patient not felt to be good candidate for CIR given waxing waning mental status, so would need skilled nursing.  At this time, it is unclear how much of her mentation issues are going to be long-term.  Concern of Warnicke's-Korsakoff syndrome and irreversible damage due to  excessive alcohol use. EEG was ordered by neurology although there was no seizure-like activity noted. Persistent encephalopathy and unable to participate with care.    Aspiration pneumonia ([place]) Patient noted to have some low-grade temperature, increased white blood cell count and increased procalcitonin.  Chest x-ray noted new infiltrate, suspicious for aspiration pneumonia.  Started antibiotics and labs are better today.  Speech therapy saw patient and due to mentation, not felt to be cleared for diet.   PEG tube was placed yesterday and tube feeding resumed today. -Continue Unasyn to complete a 5-day course  Acute renal failure superimposed on stage 3a chronic kidney disease ([place]) Most likely prerenal, creatinine fluctuating. -Monitor renal function -Avoid nephrotoxins  Alcohol abuse According to husband, patient was drinking 7-8 beers per day and has not had any alcohol in at least several days before coming in.  Status post full withdrawals.  Now on vitamin supplementation  Type 1 diabetes mellitus with hyperglycemia ([place]) - The patient will be placed on supplement coverage with NovoLog and we will continue her basal coverage.  A1c notes moderate control at 7.2, although I suspect some of her overall " controlled blood sugars" are due to poor calorie intake secondary to alcoholism.  CBGs initially in the 300-400s.  Not in DKA.  Appreciate diabetes coordinator help.  Now with sliding scale and subcu insulin.  Bronchitis-resolved as of 04/25/2022 Completed course of antibiotics to Rocephin and Zithromax  UTI due to Klebsiella species-resolved as of 04/25/2022 Urine cultures growing out pansensitive Klebsiella.  Completed course of Rocephin  Essential hypertension BNP elevated at 356 and blood pressures were starting to trend upward.  Echocardiogram noted normal diastolic function and ejection fraction of 50 to 55%, on the low end of normal.  Patient has received 1 dose of IV Lasix.  Will  add scheduled IV Lopressor given heart rate staying in the 90s and blood pressures ranging from the 130s to 170s.  Once PEG tube in place, can change to p.o. beta-blocker  GERD without esophagitis - We will continue her PPI therapy.  Overweight (BMI 25.0-29.9) Meets criteria for BMI greater than 25  Acute respiratory failure with hypoxia (HCC)-resolved as of 04/25/2022 Secondary to agitation.  Required mechanical intubation.  Hypernatremia-resolved as of 04/25/2022 Secondary to lack of p.o. intake while she is confused.  Change IV fluids to half-normal saline.        Subjective: Patient was seen and examined today.  Little more alert and denies any pain.  Following some commands with poor consistency.  Physical Exam: Vitals:   04/30/22 0457 04/30/22 0500 04/30/22 0757 04/30/22 1532  BP: (!) 148/65  (!) 146/76 (!) 109/50  Pulse: 80  78 95  Resp: 20  18 18   Temp: 97.8 F (36.6 C)  98.4 F (36.9 C) 98.7 F (37.1 C)  TempSrc:   Oral Oral  SpO2: 95%  99% 96%  Weight:  87.3 kg    Height:       General.  Ill-appearing lady, in no acute distress. Pulmonary.  Lungs clear bilaterally, normal respiratory effort. CV.  Regular rate and rhythm, no JVD, rub or murmur. Abdomen.  Soft, nontender, nondistended, BS positive. CNS.  Alert and oriented .  No focal neurologic deficit. Extremities.  No edema, no cyanosis, pulses intact and symmetrical. Psychiatry.  Judgment and insight appears impaired  Data Reviewed: Prior data reviewed  Family Communication: Discussed with husband on phone  Disposition: Status is: Inpatient Remains inpatient appropriate because: Severity of illness  Planned Discharge Destination: Skilled nursing facility  DVT prophylaxis.  Lovenox Time spent: 50 minutes  This record has been created using Systems analyst. Errors have been sought and corrected,but may not always be located. Such creation errors do not reflect on the standard of care.    Author: Lorella Nimrod, MD 04/30/2022 4:22 PM  For on call review [email or web address].

## 2022-04-30 NOTE — Progress Notes (Signed)
       CROSS COVER NOTE  NAME: Laurie Lin MRN: [id number] DOB : August 10, 1955 ATTENDING PHYSICIAN: Lorella Nimrod, MD    Date of Service   04/30/2022   HPI/Events of Note   M(r)s Callanan had a foley removed ~12hrs ago and has not voided. Bladder scan 559mL.  Interventions   Assessment/Plan:  In and out x1      To reach the provider On-Call:   7AM- 7PM see care teams to locate the attending and reach out to them via [email or web address]. Password: [email or web address] 7PM-7AM contact night-coverage If you still have difficulty reaching the appropriate provider, please page the [facility] (Director on Call) for [facility] on amion for assistance  This document was prepared using Systems analyst and may include unintentional dictation errors.  Neomia Glass DNP, MBA, FNP-BC Nurse Practitioner [facility] Pager [phone]

## 2022-04-30 NOTE — Inpatient Diabetes Management (Signed)
Inpatient Diabetes Program Recommendations  AACE/ADA: New Consensus Statement on Inpatient Glycemic Control (2015)  Target Ranges:  Prepandial:   less than 140 mg/dL      Peak postprandial:   less than 180 mg/dL (1-2 hours)      Critically ill patients:  140 - 180 mg/dL   Lab Results  Component Value Date   GLUCAP 182 (H) 04/30/2022   HGBA1C 7.2 (H) 04/18/2022    Review of Glycemic Control  Latest Reference Range & Units 04/29/22 22:59 04/30/22 03:59 04/30/22 05:21 04/30/22 08:31 04/30/22 11:44  Glucose-Capillary 70 - 99 mg/dL 103 (H) 132 (H) 172 (H) 184 (H) 182 (H)  (H): Data is abnormally high Diabetes history: DM1 (does not make any insulin; requires basal, correction, and carbohydrate coverage insulin) Outpatient Diabetes medications: Basaglar 12 units QHS, Novolog 0-9 units TID with meals  Current orders for Inpatient glycemic control: Semglee 12 units daily, Novolog 0-20 units Q4H Osmolite @ 60 ml/hr to start back today at 1200   Inpatient Diabetes Program Recommendations:     Consider adding Novolog 4 units Q4H for tube feed coverage (to be stopped or held in the event tube feeds stopped).    Thanks, Bronson Curb, MSN, RNC-OB Diabetes Coordinator [phone] (8a-5p)

## 2022-04-30 NOTE — Progress Notes (Signed)
Occupational Therapy Treatment Patient Details Name: Laurie Lin MRN: [id number] DOB: 1956-02-02 Today's Date: 04/30/2022   History of present illness Pt is a 67 y/o F admitted on 04/17/22 after presenting with c/o worsening confusion for the last month. Work up revealed UTI, AKI, hypomagnesemia, hyperkalemia. During admission pt became more agitated & intubated on 1/1, extubated 1/4. PEG placement on 1/9. PMH: DM1, CKD 3A, HTN, alcohol use   OT comments  Pt seen for skilled co-treatment by PT and OT. Pt is more interactive with staff and verbally responding which is an improvement from last session. Pt appears internally and externally distracted throughout. Pt needs max multimodal cuing for sequencing and initiation. Bed mobility with min A of 2 and pt stands with mod A of 2. She is unable to sequence steps in order to transfer to [facility] and will not sit on [facility] or bed. Pt then standing in room and having BM on floor. Pt needing physical assistance to return to bed with staff entering to assist with hygiene and linen change. Pt continues to benefit from OT intervention.    Recommendations for follow up therapy are one component of a multi-disciplinary discharge planning process, led by the attending physician.  Recommendations may be updated based on patient status, additional functional criteria and insurance authorization.    Follow Up Recommendations  Skilled nursing-short term rehab (<3 hours/day)     Assistance Recommended at Discharge Frequent or constant Supervision/Assistance  Patient can return home with the following  Two people to help with walking and/or transfers;Two people to help with bathing/dressing/bathroom   Equipment Recommendations  Other (comment) (defer to next venue of care)       Precautions / Restrictions Precautions Precautions: Fall Precaution Comments: PEG, mittens Restrictions Weight Bearing Restrictions: No       Mobility Bed Mobility Overal bed  mobility: Needs Assistance Bed Mobility: Supine to Sit, Sit to Supine Rolling: Total assist, +2 for physical assistance, +2 for safety/equipment   Supine to sit: Min assist, +2 for physical assistance Sit to supine: Total assist, +2 for physical assistance   General bed mobility comments: step by step cues for sequencing. Patient utilizing therapist hand as bed rail for trunk elevation. Assist for repositioning hips towards EOB. Returned to supine with totalA after bowel incontinence and patient with limited command following which suspect due to fatigue    Transfers Overall transfer level: Needs assistance Equipment used: Rolling walker (2 wheels) Transfers: Sit to/from Stand, Bed to chair/wheelchair/BSC Sit to Stand: Min assist, Mod assist, +2 physical assistance, +2 safety/equipment     Step pivot transfers: Max assist, +2 physical assistance, +2 safety/equipment     General transfer comment: min-modA+2 to stand from EOB x 2 with assist for initiation and boost up into standing. Attempted transfer to [facility] with patient taking steps towards but unable to sequence to sit or complete transfer as patient is having loose stool incontinence.     Balance Overall balance assessment: Needs assistance Sitting-balance support: Feet supported, Bilateral upper extremity supported Sitting balance-Leahy Scale: Fair     Standing balance support: Bilateral upper extremity supported, Reliant on assistive device for balance Standing balance-Leahy Scale: Poor                             ADL either performed or assessed with clinical judgement   ADL Overall ADL's : Needs assistance/impaired  Toilet Transfer: Rolling walker (2 wheels);BSC/3in1;+2 for safety/equipment;+2 for physical assistance;Maximal assistance;Total assistance             General ADL Comments: Pt unable to sequence motor planning for functional transfers even with +2 assistance  and began having incontinent BM on floor.    Extremity/Trunk Assessment Upper Extremity Assessment Upper Extremity Assessment: Difficult to assess due to impaired cognition            Vision Patient Visual Report: No change from baseline            Cognition Arousal/Alertness: Awake/alert Behavior During Therapy: Restless, Flat affect Overall Cognitive Status: Impaired/Different from baseline Area of Impairment: Orientation, Attention, Memory, Following commands, Safety/judgement, Awareness, Problem solving                 Orientation Level: Disoriented to, Place, Time, Situation Current Attention Level: Focused Memory: Decreased short-term memory Following Commands: Follows one step commands inconsistently Safety/Judgement: Decreased awareness of safety, Decreased awareness of deficits Awareness: Intellectual Problem Solving: Slow processing, Decreased initiation, Difficulty sequencing, Requires tactile cues, Requires verbal cues General Comments: Pt needing max multimodal cuing for sequencing and initiation                   Pertinent Vitals/ Pain       Pain Assessment Pain Assessment: Faces Faces Pain Scale: No hurt         Frequency  Min 2X/week        Progress Toward Goals  OT Goals(current goals can now be found in the care plan section)  Progress towards OT goals: Progressing toward goals  Acute Rehab OT Goals OT Goal Formulation: Patient unable to participate in goal setting  Plan Discharge plan needs to be updated;Frequency needs to be updated    Co-evaluation    PT/OT/SLP Co-Evaluation/Treatment: Yes Reason for Co-Treatment: Necessary to address cognition/behavior during functional activity;For patient/therapist safety;To address functional/ADL transfers PT goals addressed during session: Mobility/safety with mobility OT goals addressed during session: ADL's and self-care      AM-PAC OT "6 Clicks" Daily Activity     Outcome  Measure   Help from another person eating meals?: Total Help from another person taking care of personal grooming?: Total Help from another person toileting, which includes using toliet, bedpan, or urinal?: Total Help from another person bathing (including washing, rinsing, drying)?: Total Help from another person to put on and taking off regular upper body clothing?: Total Help from another person to put on and taking off regular lower body clothing?: Total 6 Click Score: 6    End of Session Equipment Utilized During Treatment: Rolling walker (2 wheels)  OT Visit Diagnosis: Other abnormalities of gait and mobility (R26.89)   Activity Tolerance Treatment limited secondary to medical complications (Comment)   Patient Left in bed;with call bell/phone within reach;with bed alarm set   Nurse Communication Mobility status        Time: 7591-6384 OT Time Calculation (min): 27 min  Charges: OT General Charges $OT Visit: 1 Visit OT Treatments $Self Care/Home Management : 8-22 mins  Darleen Crocker, MS, OTR/L , CBIS ascom [phone]  04/30/22, 4:07 PM

## 2022-04-30 NOTE — Progress Notes (Signed)
Physical Therapy Treatment Patient Details Name: Laurie Lin MRN: [id number] DOB: 1955-06-06 Today's Date: 04/30/2022   History of Present Illness Pt is a 67 y/o F admitted on 04/17/22 after presenting with c/o worsening confusion for the last month. Work up revealed UTI, AKI, hypomagnesemia, hyperkalemia. During admission pt became more agitated & intubated on 1/1, extubated 1/4. PEG placement on 1/9. PMH: DM1, CKD 3A, HTN, alcohol use    PT Comments    Patient supine in bed on arrival and verbalizing this date. Oriented to self but unable to recall year of birth. Following ~50% of commands at beginning of session but with fatigue, seemed to be following <25% of commands. Stood at bedside with min-modA+2 and RW with assist for initiation and boost up into standing. Attempted transfer to [facility] but required maxA+2 for balance and inability to sequence transfer as patient is having loose stool incontinence with no awareness. Assisted back to bed with totalA due to patient's inability to follow and sequence commands. 2 RN present and completed pericare and linen change. Continue to recommend SNF for ongoing Physical Therapy.       Recommendations for follow up therapy are one component of a multi-disciplinary discharge planning process, led by the attending physician.  Recommendations may be updated based on patient status, additional functional criteria and insurance authorization.  Follow Up Recommendations  Skilled nursing-short term rehab (<3 hours/day) Can patient physically be transported by private vehicle: No   Assistance Recommended at Discharge Frequent or constant Supervision/Assistance  Patient can return home with the following Two people to help with walking and/or transfers;Direct supervision/assist for medications management;Two people to help with bathing/dressing/bathroom;Help with stairs or ramp for entrance;Assist for transportation;Assistance with feeding;Direct  supervision/assist for financial management;Assistance with cooking/housework   Equipment Recommendations  Other (comment) (TBD)    Recommendations for Other Services       Precautions / Restrictions Precautions Precautions: Fall Precaution Comments: PEG, mittens Restrictions Weight Bearing Restrictions: No     Mobility  Bed Mobility Overal bed mobility: Needs Assistance Bed Mobility: Supine to Sit, Sit to Supine     Supine to sit: Min assist, +2 for physical assistance Sit to supine: Total assist   General bed mobility comments: step by step cues for sequencing. Patient utilizing therapist hand as bed rail for trunk elevation. Assist for repositioning hips towards EOB. Returned to supine with totalA after bowel incontinence and patient with limited command following which suspect due to fatigue    Transfers Overall transfer level: Needs assistance Equipment used: Rolling Latifa Noble (2 wheels) Transfers: Sit to/from Stand, Bed to chair/wheelchair/BSC Sit to Stand: Min assist, Mod assist, +2 physical assistance, +2 safety/equipment   Step pivot transfers: Max assist, +2 physical assistance, +2 safety/equipment       General transfer comment: min-modA+2 to stand from EOB x 2 with assist for initiation and boost up into standing. Attempted transfer to [facility] with patient taking steps towards but unable to sequence to sit or complete transfer as patient is having loose stool incontinence.    Ambulation/Gait                   Stairs             Wheelchair Mobility    Modified Rankin (Stroke Patients Only)       Balance Overall balance assessment: Needs assistance Sitting-balance support: Feet supported, Bilateral upper extremity supported Sitting balance-Leahy Scale: Fair     Standing balance support: Bilateral upper extremity supported, Reliant  on assistive device for balance Standing balance-Leahy Scale: Poor                               Cognition Arousal/Alertness: Awake/alert Behavior During Therapy: Restless, Flat affect Overall Cognitive Status: Impaired/Different from baseline Area of Impairment: Orientation, Attention, Memory, Following commands, Safety/judgement, Awareness, Problem solving                 Orientation Level: Disoriented to, Place, Time, Situation Current Attention Level: Focused Memory: Decreased short-term memory Following Commands: Follows one step commands inconsistently Safety/Judgement: Decreased awareness of safety, Decreased awareness of deficits Awareness: Intellectual Problem Solving: Slow processing, Decreased initiation, Difficulty sequencing, Requires tactile cues, Requires verbal cues General Comments: following ~50% of commands at beginning of session but as fatigues, following ~25% of commands with max mutlimodal cueing. Oriented to self but unable to recall year of birth. Difficulty sequencing basic mobility with step by step cueing and assist.        Exercises      General Comments        Pertinent Vitals/Pain Pain Assessment Pain Assessment: Faces Faces Pain Scale: No hurt Pain Intervention(s): Monitored during session    Home Living                          Prior Function            PT Goals (current goals can now be found in the care plan section) Acute Rehab PT Goals Patient Stated Goal: did not state PT Goal Formulation: With family Time For Goal Achievement: 05/10/22 Potential to Achieve Goals: Fair Progress towards PT goals: Progressing toward goals    Frequency    Min 2X/week      PT Plan Current plan remains appropriate    Co-evaluation PT/OT/SLP Co-Evaluation/Treatment: Yes Reason for Co-Treatment: Necessary to address cognition/behavior during functional activity;For patient/therapist safety;To address functional/ADL transfers;Complexity of the patient's impairments (multi-system involvement) PT goals addressed during  session: Mobility/safety with mobility;Balance        AM-PAC PT "6 Clicks" Mobility   Outcome Measure  Help needed turning from your back to your side while in a flat bed without using bedrails?: Total Help needed moving from lying on your back to sitting on the side of a flat bed without using bedrails?: Total Help needed moving to and from a bed to a chair (including a wheelchair)?: Total Help needed standing up from a chair using your arms (e.g., wheelchair or bedside chair)?: Total Help needed to walk in hospital room?: Total Help needed climbing 3-5 steps with a railing? : Total 6 Click Score: 6    End of Session   Activity Tolerance: Patient tolerated treatment well Patient left: in bed;with nursing/sitter in room (RN present to perform pericare and linen change) Nurse Communication: Mobility status PT Visit Diagnosis: Unsteadiness on feet (R26.81);Difficulty in walking, not elsewhere classified (R26.2);Muscle weakness (generalized) (M62.81)     Time: 0109-3235 PT Time Calculation (min) (ACUTE ONLY): 25 min  Charges:  $Therapeutic Activity: 8-22 mins                     Esten Dollar A. Gilford Rile PT, DPT [facility] - Acute Rehabilitation Services    Maddi Collar A Asma Boldon 04/30/2022, 1:33 PM

## 2022-04-30 NOTE — Progress Notes (Signed)
Patient feeding rate is increased by 80ml/hr at 0400. Now, feeding is going on at the rate of 6ml/hr.

## 2022-04-30 NOTE — Progress Notes (Signed)
Feeding osmolite 1.5 cal. Started at the rate of 69ml/hrs at 12:15 pm.

## 2022-04-30 NOTE — Consult Note (Signed)
Neurology Consultation  Reason for Consult: Altered mentation Referring Physician: Dr. Reesa Chew  CC: Altered mental status  History is obtained from: Chart, patient's spouse over the phone  HPI: Laurie Lin is a 67 y.o. female who has a past medical history of severe alcohol abuse, diabetes with poor compliance to medications, hypertension, reflux, CKD 3, presented to the emergency room on 04/17/2022 with complaints of altered mental status and was noted to be extremely hyperglycemic and admitted for treatment of encephalopathy and hyperglycemia.  In spite of normalizing her sugar levels, she remains significantly altered, to the point that she has not been able to pass her swallow screen and has required PEG tube placement for nutrition. For the persistent altered mental status, neurological consultation was obtained. I called the husband and obtain some further history.  He reports that the patient had been acting somewhat confused for about a week or 10 days prior to presentation.  She was saying things such as "this house is not mine" while being in her own house that she has lived in a long time.  She was also not taking good care of herself.  The husband reports that she had a problem with alcohol abuse and would hide her drinking, going to the bathroom and shut the door and drink.  He reports that she would start drinking beers in the morning and by the time afternoon Golden, she would change to consuming large amounts of liquor.  He could not tell me the exact amount of liquor she consumed but he said that it was a lot and she was intoxicated most of the day. He had started noticing some cognitive deficits more so in the past couple of weeks and he said that he thought that she was having full-blown dementia. There was also concern for hallucinations reported by the family. Going back a few months, the husband said that she was doing reasonably okay far back as fall 2023.  Chart review  reveals that she had a neurological consultation in 2018 in the context of DKA for altered mental status and it was deemed that it was the myriad of toxic metabolic derangements that were causing confusion and she made decent recovery per the family.  She is followed by rheumatology and endocrinology.  Had a history of insulin pump in the remote past but has refused to use that in the more recent past.  ROS: Obtained from family and documented pertinent positives above.  Patient unable to provide any reliable history.  Past Medical History:  Diagnosis Date   Diabetes mellitus without complication (HCC)    GERD (gastroesophageal reflux disease)    Hypertension    Renal disorder    stage 3    Family History  Problem Relation Age of Onset   Diabetes Mother     Social History:  Reportedly drinks multiple drinks of beer and liquor each day for the past many years.  Medications  Current Facility-Administered Medications:    0.9 %  sodium chloride infusion, , Intravenous, PRN, Annita Brod, MD, Last Rate: 10 mL/hr at 04/30/22 0330, Infusion Verify at 04/30/22 0330   acetaminophen (TYLENOL) tablet 650 mg, 650 mg, Per Tube, Q6H PRN, 650 mg at 04/28/22 1132 **OR** acetaminophen (TYLENOL) suppository 650 mg, 650 mg, Rectal, Q6H PRN, Annita Brod, MD   Ampicillin-Sulbactam (UNASYN) 3 g in sodium chloride 0.9 % 100 mL IVPB, 3 g, Intravenous, Q6H, Kc, Ramesh, MD, Last Rate: 200 mL/hr at 04/30/22 0742, 3 g at  04/30/22 0742   Chlorhexidine Gluconate Cloth 2 % PADS 6 each, 6 each, Topical, Daily, Annita Brod, MD, 6 each at 04/30/22 0954   dextrose 5 % in lactated ringers infusion, , Intravenous, Continuous, Lorella Nimrod, MD, Last Rate: 100 mL/hr at 04/30/22 0330, Infusion Verify at 04/30/22 0330   enoxaparin (LOVENOX) injection 40 mg, 40 mg, Subcutaneous, Q24H, Annita Brod, MD, 40 mg at 04/30/22 0746   feeding supplement (OSMOLITE 1.5 CAL) liquid 1,000 mL, 1,000 mL, Per Tube,  Continuous, Lorella Nimrod, MD   [START ON 05/01/2022] feeding supplement (PROSource TF20) liquid 60 mL, 60 mL, Per Tube, Daily, Lorella Nimrod, MD   folic acid injection 1 mg, 1 mg, Intravenous, Daily, Gevena Barre K, MD, 1 mg at 04/29/22 1233   free water 140 mL, 140 mL, Per Tube, Q4H, Lorella Nimrod, MD   hydrOXYzine (ATARAX) tablet 10 mg, 10 mg, Per Tube, TID PRN, Annita Brod, MD, 10 mg at 04/27/22 0921   insulin aspart (novoLOG) injection 0-20 Units, 0-20 Units, Subcutaneous, Q4H, Annita Brod, MD, 4 Units at 04/30/22 0814   insulin glargine-yfgn (SEMGLEE) injection 12 Units, 12 Units, Subcutaneous, Daily, Annita Brod, MD, 12 Units at 04/30/22 0945   labetalol (NORMODYNE) injection 10 mg, 10 mg, Intravenous, Q2H PRN, Annita Brod, MD, 10 mg at 04/26/22 1114   magnesium hydroxide (MILK OF MAGNESIA) suspension 30 mL, 30 mL, Per Tube, Daily PRN, Annita Brod, MD   metoCLOPramide (REGLAN) injection 5 mg, 5 mg, Intravenous, Q8H, Annita Brod, MD, 5 mg at 04/30/22 0604   metoprolol tartrate (LOPRESSOR) injection 5 mg, 5 mg, Intravenous, Q6H, Annita Brod, MD, 5 mg at 04/30/22 0604   ondansetron (ZOFRAN) tablet 4 mg, 4 mg, Per Tube, Q6H PRN **OR** ondansetron (ZOFRAN) injection 4 mg, 4 mg, Intravenous, Q6H PRN, Annita Brod, MD, 4 mg at 04/28/22 1114   Oral care mouth rinse, 15 mL, Mouth Rinse, 4 times per day, Annita Brod, MD, 15 mL at 04/30/22 0818   Oral care mouth rinse, 15 mL, Mouth Rinse, PRN, Annita Brod, MD   [COMPLETED] thiamine (VITAMIN B1) 500 mg in normal saline (50 mL) IVPB, 500 mg, Intravenous, Daily, Stopped at 04/25/22 1029 **FOLLOWED BY** thiamine (VITAMIN B1) injection 100 mg, 100 mg, Intravenous, Daily, Annita Brod, MD, 100 mg at 04/29/22 1230   Exam: Current vital signs: BP (!) 146/76 (BP Location: Left Arm)   Pulse 78   Temp 98.4 F (36.9 C) (Oral)   Resp 18   Ht 5\' 10"  (1.778 m)   Wt 87.3 kg   LMP  09/09/2004 (Approximate)   SpO2 99%   BMI 27.62 kg/m  Vital signs in last 24 hours: Temp:  [97.8 F (36.6 C)-98.7 F (37.1 C)] 98.4 F (36.9 C) (01/10 0757) Pulse Rate:  [57-100] 78 (01/10 0757) Resp:  [14-22] 18 (01/10 0757) BP: (128-157)/(55-77) 146/76 (01/10 0757) SpO2:  [92 %-99 %] 99 % (01/10 0757) Weight:  [87.3 kg] 87.3 kg (01/10 0500) General: Patient is awake alert in no apparent distress. HEENT: Normocephalic, atraumatic, dry oral mucous membranes Lungs: Clear Cardiovascular: Regular rhythm Neurological exam Awake alert oriented to self Was able to tell me her name and her dysarthric tone Was unable to tell me her date of birth or correct age.  Unable to tell me the correct month. Difficult time following simple conversations and commands.  Was able to mimic some actions but not reliably follow single step commands. She was  also unable to name objects correctly. Poor attention concentration Cranial nerves II to XII appear intact. Motor examination-antigravity strength in all fours Sensation intact Coordination: Mild resting tremor in both upper extremities.  Difficult to assess finger-to-nose testing reliably due to her inability to follow commands.  Labs I have reviewed labs in epic and the results pertinent to this consultation are:  CBC    Component Value Date/Time   WBC 14.2 (H) 04/29/2022 0559   RBC 3.20 (L) 04/29/2022 0559   HGB 10.4 (L) 04/29/2022 0559   HCT 32.8 (L) 04/29/2022 0559   PLT 646 (H) 04/29/2022 0559   MCV 102.5 (H) 04/29/2022 0559   MCH 32.5 04/29/2022 0559   MCHC 31.7 04/29/2022 0559   RDW 13.0 04/29/2022 0559   LYMPHSABS 1.7 04/29/2022 0559   MONOABS 1.2 (H) 04/29/2022 0559   EOSABS 0.9 (H) 04/29/2022 0559   BASOSABS 0.1 04/29/2022 0559    CMP     Component Value Date/Time   NA 139 04/28/2022 0451   NA 142 03/29/2019 0000   K 4.8 04/28/2022 0451   CL 103 04/28/2022 0451   CO2 26 04/28/2022 0451   GLUCOSE 298 (H) 04/28/2022  0451   BUN 33 (H) 04/28/2022 0451   BUN 12 03/29/2019 0000   CREATININE 1.53 (H) 04/28/2022 0451   CALCIUM 8.7 (L) 04/28/2022 0451   PROT 5.9 (L) 04/24/2022 0529   ALBUMIN 2.1 (L) 04/24/2022 0529   AST 18 04/24/2022 0529   ALT 9 04/24/2022 0529   ALKPHOS 87 04/24/2022 0529   BILITOT 0.6 04/24/2022 0529   GFRNONAA 37 (L) 04/28/2022 0451   GFRAA 18 (L) 03/27/2017 0449   TSH 1.9 B12 level from August 2023 290  Imaging I have reviewed the images obtained: MR brain with no acute injuries.  Advanced diffuse volume loss for age with periventricular white matter hyperintensities-nonspecific but can be seen in chronic alcoholic encephalopathy in the appropriate context.  Assessment:  67 year old with above past medical history which is significant for diabetes, excessive alcohol use amongst other comorbidities, presenting for evaluation of confusion, noted to be hyperglycemic and admitted for treatment but remains extensively encephalopathic. Has been on IV thiamine since admission. Examination reveals confusion perseveration and at times also confabulation in her speech. MR imaging reveals extensive generalized atrophy which is far advanced than what is expected for her age along with changes which may point to chronic alcoholic encephalopathy-periventricular white matter hyperintensities. I suspect that she has Wernicke Korsakoff syndrome due to years of alcohol abuse and her diabetes is also adding insult to the injury and contributing to this altered mental status.  Impression: Possible Wernicke Korsakoff syndrome-may have already sustained irreversible damage Evaluate for reversible causes of dementia/encephalopathy  Recommendations: B12 levels from August are 290, although within the lab normal limit, neurologically, we would like levels to be greater than 400. There is no point in checking thiamine levels as it has been supplemented ever since her admission. Continue supplementation  of thiamine I will get a routine EEG to ensure that there is no underlying seizure activity that can explain her altered mentation  Will follow.  -- Amie Portland, MD Neurologist [facility] Pager: [phone]

## 2022-05-01 ENCOUNTER — Inpatient Hospital Stay: Payer: Medicare Other

## 2022-05-01 DIAGNOSIS — G9340 Encephalopathy, unspecified: Secondary | ICD-10-CM | POA: Diagnosis not present

## 2022-05-01 DIAGNOSIS — R4182 Altered mental status, unspecified: Secondary | ICD-10-CM | POA: Diagnosis not present

## 2022-05-01 LAB — PHOSPHORUS: Phosphorus: 3.8 mg/dL (ref 2.5–4.6)

## 2022-05-01 LAB — MAGNESIUM: Magnesium: 1.6 mg/dL — ABNORMAL LOW (ref 1.7–2.4)

## 2022-05-01 LAB — CBC
HCT: 29.1 % — ABNORMAL LOW (ref 36.0–46.0)
Hemoglobin: 9.2 g/dL — ABNORMAL LOW (ref 12.0–15.0)
MCH: 32.3 pg (ref 26.0–34.0)
MCHC: 31.6 g/dL (ref 30.0–36.0)
MCV: 102.1 fL — ABNORMAL HIGH (ref 80.0–100.0)
Platelets: 585 10*3/uL — ABNORMAL HIGH (ref 150–400)
RBC: 2.85 MIL/uL — ABNORMAL LOW (ref 3.87–5.11)
RDW: 13.1 % (ref 11.5–15.5)
WBC: 12.9 10*3/uL — ABNORMAL HIGH (ref 4.0–10.5)
nRBC: 0 % (ref 0.0–0.2)

## 2022-05-01 LAB — COMPREHENSIVE METABOLIC PANEL
ALT: 7 U/L (ref 0–44)
AST: 19 U/L (ref 15–41)
Albumin: 2.2 g/dL — ABNORMAL LOW (ref 3.5–5.0)
Alkaline Phosphatase: 81 U/L (ref 38–126)
Anion gap: 9 (ref 5–15)
BUN: 22 mg/dL (ref 8–23)
CO2: 24 mmol/L (ref 22–32)
Calcium: 8.6 mg/dL — ABNORMAL LOW (ref 8.9–10.3)
Chloride: 107 mmol/L (ref 98–111)
Creatinine, Ser: 1.76 mg/dL — ABNORMAL HIGH (ref 0.44–1.00)
GFR, Estimated: 31 mL/min — ABNORMAL LOW (ref >60)
Glucose, Bld: 244 mg/dL — ABNORMAL HIGH (ref 70–99)
Potassium: 4 mmol/L (ref 3.5–5.1)
Sodium: 140 mmol/L (ref 135–145)
Total Bilirubin: 0.3 mg/dL (ref 0.3–1.2)
Total Protein: 6.3 g/dL — ABNORMAL LOW (ref 6.5–8.1)

## 2022-05-01 LAB — RETICULOCYTES
Immature Retic Fract: 15.6 % (ref 2.3–15.9)
RBC.: 2.83 MIL/uL — ABNORMAL LOW (ref 3.87–5.11)
Retic Count, Absolute: 39.6 10*3/uL (ref 19.0–186.0)
Retic Ct Pct: 1.4 % (ref 0.4–3.1)

## 2022-05-01 LAB — GLUCOSE, CAPILLARY
Glucose-Capillary: 100 mg/dL — ABNORMAL HIGH (ref 70–99)
Glucose-Capillary: 190 mg/dL — ABNORMAL HIGH (ref 70–99)
Glucose-Capillary: 202 mg/dL — ABNORMAL HIGH (ref 70–99)
Glucose-Capillary: 204 mg/dL — ABNORMAL HIGH (ref 70–99)
Glucose-Capillary: 213 mg/dL — ABNORMAL HIGH (ref 70–99)
Glucose-Capillary: 290 mg/dL — ABNORMAL HIGH (ref 70–99)

## 2022-05-01 LAB — IRON AND TIBC
Iron: 22 ug/dL — ABNORMAL LOW (ref 28–170)
Saturation Ratios: 11 % (ref 10.4–31.8)
TIBC: 209 ug/dL — ABNORMAL LOW (ref 250–450)
UIBC: 187 ug/dL

## 2022-05-01 LAB — FERRITIN: Ferritin: 84 ng/mL (ref 11–307)

## 2022-05-01 LAB — FOLATE: Folate: 13.8 ng/mL (ref >5.9)

## 2022-05-01 LAB — VITAMIN B12: Vitamin B-12: 251 pg/mL (ref 180–914)

## 2022-05-01 MED ORDER — HALOPERIDOL LACTATE 5 MG/ML IJ SOLN
1.0000 mg | Freq: Once | INTRAMUSCULAR | Status: AC
  Administered 2022-05-01: 1 mg via INTRAMUSCULAR
  Filled 2022-05-01: qty 1

## 2022-05-01 MED ORDER — MAGNESIUM SULFATE 4 GM/100ML IV SOLN
4.0000 g | Freq: Once | INTRAVENOUS | Status: AC
  Administered 2022-05-01: 4 g via INTRAVENOUS
  Filled 2022-05-01: qty 100

## 2022-05-01 MED ORDER — CEFAZOLIN SODIUM-DEXTROSE 2-4 GM/100ML-% IV SOLN: 2.0000 g | INTRAVENOUS | Status: AC

## 2022-05-01 NOTE — Progress Notes (Signed)
Speech Language Pathology Treatment:    Patient Details Name: Laurie Lin MRN: [id number] DOB: December 12, 1955 Today's Date: 05/01/2022 Time: 1040-1100 SLP Time Calculation (min) (ACUTE ONLY): 20 min  Assessment / Plan / Recommendation Clinical Impression  Pt seen for skilled dysphagia tx. Pt was awake, notably confused. In bed getting IV placed by nursing upon SLP entrance to room. B mitts donned upon SLP entrance and for duration of evaluation. On room air. Cleared with RN.   Pt s/p PEG placement on 04/29/22.   Oral care provided via foam toothette and oral solution in oral care kit. Pt allowed for brief swabbing of oral structures. Verbal cueing needed throughout. Pt bit down on toothette x1 and opened mouth with verbal cueing from SLP. Pt given trials of ice chips (x5) and x1 tsp of water. Pt with s/sx oral dysphagia including oral holding and delayed oral transit with water and inconsistently incoordinated bolus retrieval attempts (e.g. biting down on spoon) with tsps of ice chip. Concern for pharyngeal dysphagia given wet vocal quality and delayed cough with tsp of water. Pt cued to throat clear in attempt to alleviate wet vocal quality. Pt unable to complete volitional throat clear.   Recommend NPO with long term alternate means of nutrition/hydration/medication. Pt OK for ice chips in moderation following oral care. Pt will need 1:1 supervision and assistance with feeding ice chips as well as assistance with oral care  SLP to f/u per POC for further clinical swallowing assessment and consideration for instrumental swallowing evaluation.   Pt and RN made aware of results, recommendations, and SLP POC. Pt will likely need reinforcement of content given confusion.    HPI HPI: 67 year old female with past medical history of ETOH abuse, diabetes mellitus type 1, stage IIIa chronic kidney disease, hypertension and ongoing Alcohol use who was brought in for worsening confusion for the last month.   Workup revealed urinary tract infection, acute kidney injury, hypomagnesemia, hyperkalemia.and Wernicke's encephalopathy.  While on floor she continued to deteriorate and has become unresponsive with jerky movements. PCCM consultation for concern patient developing Delerium Tremens. Pt required oral intubation; intubated 01/01 thru 04/24/2022.   CXR on 04/25/22: New right perihilar airspace opacity suspicious for early  pneumonia.  2. Hazy accentuated density at the right lung base with obscuration  of the right lateral hemidiaphragm, potentially from atelectasis or  pneumonia.  3. Airway thickening is present, suggesting bronchitis or reactive  airways disease.      SLP Plan  Continue with current plan of care      Recommendations for follow up therapy are one component of a multi-disciplinary discharge planning process, led by the attending physician.  Recommendations may be updated based on patient status, additional functional criteria and insurance authorization.    Recommendations  Diet recommendations: NPO (x ice chips in moderation following oral care) Medication Administration: Via alternative means Supervision:  (staff to present ice chips via tsp) Postural Changes and/or Swallow Maneuvers:  (upright and alert for ice chips)                Oral Care Recommendations: Oral care QID;Staff/trained caregiver to provide oral care (oral care before ice chips) Follow Up Recommendations: Skilled nursing-short term rehab (<3 hours/day) Assistance recommended at discharge: Frequent or constant Supervision/Assistance SLP Visit Diagnosis: Dysphagia, oropharyngeal phase (R13.12) Plan: Continue with current plan of care          Cherrie Gauze, M.S., [facility] [phone] ([place])  Quintella Baton  05/01/2022, 11:25 AM

## 2022-05-01 NOTE — Progress Notes (Signed)
Nutrition Follow-up  DOCUMENTATION CODES:   Not applicable  INTERVENTION:   -TF via g-tube:  Osmolite 1.5 @ 60 ml/hr    60 ml Prosource TF daily   140 ml free water flush every 4 hours    Tube feeding regimen provides 2240 kcal (100% of needs), 110 grams of protein, and 1097 ml of H2O. Total free water: 10937 ml daily    NUTRITION DIAGNOSIS:   Inadequate oral intake related to inability to eat (pt sedated and ventilated) as evidenced by NPO status.  Ongoing  GOAL:   Patient will meet greater than or equal to 90% of their needs  Met with TF  MONITOR:   Diet advancement, Labs, Weight trends, TF tolerance, Skin, I & O's  REASON FOR ASSESSMENT:   Consult Enteral/tube feeding initiation and management  ASSESSMENT:   67 y/o female with h/o IDDM, HTN, etoh abuse, GERD, hital hernia, neuropathy, cirrhosis, varices and CKD III who is admitted with encephalopathy, UTI and possible Wernickes.  1/8- s/p BSE- recommend continued NPO 1/9- s/p g-tube placement 1/10- TF initiated 1/11- s/p BSE- recommend NPO  Reviewed I/O's: -642 ml x 24 hours and +14.5 L since admission  UOP: 900 ml x 24 hours   Pt out of room at time of visit.   Per RN, TF is at goal rate of 60 ml/hr and tolerating well.   Per TOC notes, awaiting SNF placement at discharge.   Medications reviewed and include folic acid, reglan,and vitamin B-1.   Labs reviewed: CBGS: 202-202 (inpatient orders for glycemic control are 0-20 units insulin aspart every 4 hours and 12 units insulin glargine-yfgn daily).    Diet Order:   Diet Order             Diet NPO time specified Except for: Ice Chips  Diet effective now                   EDUCATION NEEDS:   No education needs have been identified at this time  Skin:  Skin Assessment: Skin Integrity Issues: Skin Integrity Issues:: Other (Comment) Other: skin tear to lt hand  Last BM:  04/29/22 (type 7- via rectal tube)  Height:   Ht Readings from  Last 1 Encounters:  04/29/22 5\' 10"  (1.778 m)    Weight:   Wt Readings from Last 1 Encounters:  05/01/22 87 kg    Ideal Body Weight:  75.45 kg  BMI:  Body mass index is 27.52 kg/m.  Estimated Nutritional Needs:   Kcal:  1900-2200kcal/day  Protein:  95-110g/day  Fluid:  > 1.9 L    Loistine Chance, RD, LDN, [place] Registered Dietitian II Certified Diabetes Care and Education Specialist Please refer to [facility] for RD and/or RD on-call/weekend/after hours pager

## 2022-05-01 NOTE — Inpatient Diabetes Management (Signed)
Inpatient Diabetes Program Recommendations  AACE/ADA: New Consensus Statement on Inpatient Glycemic Control (2015)  Target Ranges:  Prepandial:   less than 140 mg/dL      Peak postprandial:   less than 180 mg/dL (1-2 hours)      Critically ill patients:  140 - 180 mg/dL    Latest Reference Range & Units 04/30/22 08:31 04/30/22 11:44 04/30/22 16:49 04/30/22 21:04 05/01/22 00:21 05/01/22 04:47 05/01/22 07:52  Glucose-Capillary 70 - 99 mg/dL 184 (H) 182 (H) 162 (H) 195 (H) 190 (H) 213 (H) 202 (H)  (H): Data is abnormally high   Diabetes history: DM1 (does not make any insulin; requires basal, correction, and carbohydrate coverage insulin)    Home DM Meds: Basaglar 12 units QHS     Novolog 0-9 units TID with meals    Current Orders: Semglee 12 units Daily      Novolog Resistant Correction Scale/ SSI (0-20 units) Q4 hours      MD- Note Osmolite tube feeds started yest at 12pm   Tube feed rate 50cc/hr  Once tube feeds reach goal rate of 60cc/hr, please add Novolog Tube Feed Coverage: Novolog 4 units Q4 hours  HOLD if tube Feeds HELD for any reason    --Will follow patient during hospitalization--  Wyn Quaker RN, MSN, [place] Diabetes Coordinator Inpatient Glycemic Control Team Team Pager: [phone] (8a-5p)

## 2022-05-01 NOTE — Progress Notes (Signed)
Progress Note   Patient: Laurie Lin VQQ:[id number] DOB: 1956/03/23 DOA: 04/17/2022     13 DOS: the patient was seen and examined on 05/01/2022   Brief hospital course: 41yof w/ diabetes mellitus type 1, stage IIIa chronic kidney disease, hypertension and ongoing alcohol use who was brought in for worsening confusion for the last month.  Workup revealed urinary tract infection, acute kidney injury, hypomagnesemia and hyperkalemia.  MRI unremarkable for CVA. On evening of 12/30, patient started becoming slightly more agitated requiring Ativan.  As 12/31 progressed, patient became even more agitated despite Ativan.  Discussed with critical care.  Patient transferred to critical care service to the ICU and started on Precedex drip.  With worsening agitation, patient intubated on 1/1.  By 1/3, able to be weaned off of Precedex and able to be extubated on 1/4.  Patient also with Klebsiella UTI, treated with course of Rocephin.  [facility] assumed care 1/5 and patient started on IV antibiotics for aspiration pneumonia.  Mentation waxing and waning and unable to clear swallow evaluation.  Patient currently on tube feeds.further discussion was had w/ family and proceeding with PEG tube placement.  1/8: Mild tachycardia and low 100s, labs with improvement in leukocytosis.  BMP with slight worsening of creatinine to 1.53.  Increasing free water intake through NG tube.  CBG remained elevated, adding NovoLog 7 unit every 4 hourly if continued to receive tube feeding, discontinued later as patient developed some hypoglycemia. Patient had some nausea and vomiting so tube feed is currently being held. Going for PEG tube placement tomorrow.  1/9: Patient had her PEG tube placed today by IR.  Remained quite encephalopathic and not following any commands.  She did received high-dose thiamine for concern of Wernicke's encephalopathy during current hospitalization.  Imaging was negative.  Did received multiple  courses of antibiotics for concern of bronchitis followed by UTI followed by aspiration pneumonia. Consulted neurology for persistent encephalopathy. PEG tube will be ready for medications around 6 PM and to start feed tomorrow morning.  1/10: Patient more alert and able to communicate to some extent.  Not following commands very consistently.  Still unable to void after removing Foley catheter yesterday afternoon.  Requiring in and out catheter x 1 overnight and the second time this morning.  We will try to encourage voiding and if still unable to void then might need long term Foley catheter. Tube feeding was resumed through PEG tube today. Concern of Wernicke's/Korsakoff syndrome, might have some irreversible damage due to excessive alcohol use.  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  Neurology is recommending checking B12 and to keep it above 400.  They also ordered routine EEG although there was no underlying seizure-like activity noted. Ordered a.m. labs with anemia panel.  1/11: Vital stable labs with some decrease in leukocytosis to 12.9 and hemoglobin decreased to 9.2, all cell lines decreased, most likely some dilutional effect.  Slight worsening of creatinine to 1.76, ordered renal ultrasound and it was unremarkable.  Anemia panel with normal ferritin, folate and iron studies consistent with anemia of chronic disease with mildly low iron.  B12 and vitamin D Hypomagnesemia with magnesium of 1.6 which is being repleted Will complete the course of antibiotic today for aspiration pneumonia. Foley catheter was replaced overnight due to inability to void and requiring multiple in and out catheterization. EEG with diffuse encephalopathy and no  concern of seizures.  Assessment and Plan: * Encephalopathy acute MRI ruled out CVA, but does note significant wear and  tear disproportionate for patient's age.  Discussed with the patient's husband and he understands.  If patient not improved, husband is interested in a long-term care facility.  Decompensated requiring Precedex drip and mechanical intubation.  Patient able to be weaned off Precedex and extubated 1/4.  She is now more appropriate and still weak and still  confused, but answering some questions.  PT and OT recommending CIR, but patient not felt to be good candidate for CIR given waxing waning mental status, so would need skilled nursing.  At this time, it is unclear how much of her mentation issues are going to be long-term.  Concern of Warnicke's-Korsakoff syndrome and irreversible damage due to excessive alcohol use. EEG was ordered by neurology although there was no seizure-like activity noted. Persistent encephalopathy and unable to participate with care.    Aspiration pneumonia ([place]) Patient noted to have some low-grade temperature, increased white blood cell count and increased procalcitonin.  Chest x-ray noted new infiltrate, suspicious for aspiration pneumonia.  Started antibiotics and labs are better today.  Speech therapy saw patient and due to mentation, not felt to be cleared for diet.   PEG tube was placed yesterday and tube feeding resumed today. -Continue Unasyn to complete a 5-day course  Acute renal failure superimposed on stage 3a chronic kidney disease ([place]) Most likely prerenal, creatinine fluctuating. -Monitor renal function -Avoid nephrotoxins  Alcohol abuse According to husband, patient was drinking 7-8 beers per day and has not had any alcohol in at least several days before coming in.  Status post full withdrawals.  Now on vitamin supplementation  Type 1 diabetes mellitus with hyperglycemia ([place]) - The patient will be placed on supplement coverage with NovoLog and we will continue her basal coverage.  A1c notes moderate control at 7.2, although I suspect some of her  overall " controlled blood sugars" are due to poor calorie intake secondary to alcoholism.  CBGs initially in the 300-400s.  Not in DKA.  Appreciate diabetes coordinator help.  Now with sliding scale and subcu insulin.  Bronchitis-resolved as of 04/25/2022 Completed course of antibiotics to Rocephin and Zithromax  UTI due to Klebsiella species-resolved as of 04/25/2022 Urine cultures growing out pansensitive Klebsiella.  Completed course of Rocephin  Essential hypertension BNP elevated at 356 and blood pressures were starting to trend upward.  Echocardiogram noted normal diastolic function and ejection fraction of 50 to 55%, on the low end of normal.  Patient has received 1 dose of IV Lasix.  Will add scheduled IV Lopressor given heart rate staying in the 90s and blood pressures ranging from the 130s to 170s.  Once PEG tube in place, can change to p.o. beta-blocker  GERD without esophagitis - We will continue her PPI therapy.  Overweight (BMI 25.0-29.9) Meets criteria for BMI greater than 25  Acute respiratory failure with hypoxia (HCC)-resolved as of 04/25/2022 Secondary to agitation.  Required mechanical intubation.  Hypernatremia-resolved as of 04/25/2022 Secondary to lack of p.o. intake while she is confused.  Change IV fluids to half-normal saline.        Subjective: Patient was seen and examined today.  She was pleasantly confused with some confabulations and still unable to follow any commands.  Trying to get out of bed.  She pulled her IV line.  Physical Exam: Vitals:   05/01/22 0028 05/01/22 0448 05/01/22 0500 05/01/22 0933  BP: 139/68 Marland Kitchen)  128/55  (!) 155/65  Pulse: 84 84  75  Resp:  18  18  Temp:  97.7 F (36.5 C)  98.1 F (36.7 C)  TempSrc:  Oral  Oral  SpO2:  94%  (!) 82%  Weight:   87 kg   Height:       General.  Well-developed lady, in no acute distress. Pulmonary.  Lungs clear bilaterally, normal respiratory effort. CV.  Regular rate and rhythm, no JVD, rub or  murmur. Abdomen.  Soft, nontender, nondistended, BS positive. CNS.  Alert and oriented to name only.  No focal neurologic deficit. Extremities.  No edema, no cyanosis, pulses intact and symmetrical. Psychiatry.  Judgment and insight appears impaired.  Data Reviewed: Prior data reviewed  Family Communication:   Disposition: Status is: Inpatient Remains inpatient appropriate because: Severity of illness  Planned Discharge Destination: Skilled nursing facility  DVT prophylaxis.  Lovenox Time spent: 45 minutes  This record has been created using Systems analyst. Errors have been sought and corrected,but may not always be located. Such creation errors do not reflect on the standard of care.   Author: Lorella Nimrod, MD 05/01/2022 2:50 PM  For on call review [email or web address].

## 2022-05-01 NOTE — TOC Progression Note (Addendum)
Transition of Care [facility]) - Progression Note    Patient Details  Name: Laurie Lin MRN: [id number] Date of Birth: Dec 21, 1955  Transition of Care [facility]) CM/SW [place], LCSW Phone Number: 05/01/2022, 10:20 AM  Clinical Narrative:    CSW spoke with patient's spouse, [name], to discuss SNF recs.  He wants patient to go to [facility], however, they have declined.  CSW explained that there are 2 locations ([facility] in [place] and [facility] in [place]) are pending.  CSW will call for a determination and update [place]. [name] was informed that, in the case of one bed offer, that would be the location, but if he didn't approve, he could take patient home and receive [facility] services there.  [name] stated that he understood and will wait for follow-up information.  No EDD noted as of this time.  10:34  Called [facility] and spoke with Sharyn Lull.  She will take a look and give a determination.  Also called [facility] in [place] and lvm for [place].  3:54 Returned calls to facilities about decision on bed offer.  LVM.   Expected Discharge Plan: [place] Barriers to Discharge: Continued Medical Work up  Expected Discharge Plan and [place] arrangements for the past 2 months: Single Family Home                                       Social Determinants of Health (SDOH) Interventions SDOH Screenings   Food Insecurity: No Food Insecurity (04/19/2022)  Housing: Low Risk  (04/19/2022)  Transportation Needs: No Transportation Needs (04/19/2022)  Utilities: Not At Risk (04/19/2022)  Tobacco Use: Low Risk  (04/29/2022)    Readmission Risk Interventions     No data to display

## 2022-05-01 NOTE — Plan of Care (Signed)

## 2022-05-01 NOTE — Progress Notes (Signed)
Neurology Progress Note   S:// Patient seen and examined.  No acute changes.   O:// Current vital signs: BP (!) 155/65 (BP Location: Left Arm)   Pulse 75   Temp 98.1 F (36.7 C) (Oral)   Resp 18   Ht 5\' 10"  (1.778 m)   Wt 87 kg   LMP 09/09/2004 (Approximate)   SpO2 (!) 82%   BMI 27.52 kg/m  Vital signs in last 24 hours: Temp:  [97.7 F (36.5 C)-99.3 F (37.4 C)] 98.1 F (36.7 C) (01/11 0933) Pulse Rate:  [75-95] 75 (01/11 0933) Resp:  [18] 18 (01/11 0933) BP: (109-155)/(50-68) 155/65 (01/11 0933) SpO2:  [82 %-96 %] 82 % (01/11 0933) Weight:  [87 kg] 87 kg (01/11 0500) General: Awake alert no distress HEENT: Normocephalic atraumatic Lungs: Clear Cardiovascular: Regular rhythm Neurological exam Awake alert oriented to self Mildly dysarthric Was able to tell me her name Unable to tell correct age, place or date of birth. Inconsistently following simple commands Mimics some actions but not consistently Does not reliably follow commands Cannot repeat Poor attention concentration Cranial nerves II to XII appear intact Motor strength in all 4 extremities seems symmetric and full Sensation intact Coordination: Mild resting tremor in both upper extremities, difficult to assess finger-nose testing reliably due to her inability to follow commands.   Medications  Current Facility-Administered Medications:    0.9 %  sodium chloride infusion, , Intravenous, PRN, Annita Brod, MD, Last Rate: 10 mL/hr at 04/30/22 2019, Infusion Verify at 04/30/22 2019   acetaminophen (TYLENOL) tablet 650 mg, 650 mg, Per Tube, Q6H PRN, 650 mg at 04/28/22 1132 **OR** acetaminophen (TYLENOL) suppository 650 mg, 650 mg, Rectal, Q6H PRN, Annita Brod, MD   Ampicillin-Sulbactam (UNASYN) 3 g in sodium chloride 0.9 % 100 mL IVPB, 3 g, Intravenous, Q6H, Amin, Soundra Pilon, MD, Last Rate: 200 mL/hr at 05/01/22 0142, 3 g at 05/01/22 0142   Chlorhexidine Gluconate Cloth 2 % PADS 6 each, 6 each,  Topical, Daily, Annita Brod, MD, 6 each at 04/30/22 0954   enoxaparin (LOVENOX) injection 40 mg, 40 mg, Subcutaneous, Q24H, Gevena Barre K, MD, 40 mg at 04/30/22 0746   feeding supplement (OSMOLITE 1.5 CAL) liquid 1,000 mL, 1,000 mL, Per Tube, Continuous, Lorella Nimrod, MD, Last Rate: 20 mL/hr at 04/30/22 2019, Infusion Verify at 04/30/22 2019   feeding supplement (PROSource TF20) liquid 60 mL, 60 mL, Per Tube, Daily, Lorella Nimrod, MD   folic acid injection 1 mg, 1 mg, Intravenous, Daily, Gevena Barre K, MD, 1 mg at 04/30/22 1141   free water 140 mL, 140 mL, Per Tube, Q4H, Amin, Soundra Pilon, MD, 140 mL at 05/01/22 0800   hydrOXYzine (ATARAX) tablet 10 mg, 10 mg, Per Tube, TID PRN, Annita Brod, MD, 10 mg at 04/27/22 0921   insulin aspart (novoLOG) injection 0-20 Units, 0-20 Units, Subcutaneous, Q4H, Annita Brod, MD, 7 Units at 05/01/22 0451   insulin glargine-yfgn (SEMGLEE) injection 12 Units, 12 Units, Subcutaneous, Daily, Annita Brod, MD, 12 Units at 04/30/22 0945   labetalol (NORMODYNE) injection 10 mg, 10 mg, Intravenous, Q2H PRN, Annita Brod, MD, 10 mg at 04/26/22 1114   magnesium hydroxide (MILK OF MAGNESIA) suspension 30 mL, 30 mL, Per Tube, Daily PRN, Annita Brod, MD   magnesium sulfate IVPB 4 g 100 mL, 4 g, Intravenous, Once, Lorella Nimrod, MD   metoCLOPramide (REGLAN) injection 5 mg, 5 mg, Intravenous, Q8H, Annita Brod, MD, 5 mg at 05/01/22 [phone]  metoprolol tartrate (LOPRESSOR) injection 5 mg, 5 mg, Intravenous, Q6H, Annita Brod, MD, 5 mg at 05/01/22 0550   ondansetron (ZOFRAN) tablet 4 mg, 4 mg, Per Tube, Q6H PRN **OR** ondansetron (ZOFRAN) injection 4 mg, 4 mg, Intravenous, Q6H PRN, Annita Brod, MD, 4 mg at 04/28/22 1114   Oral care mouth rinse, 15 mL, Mouth Rinse, 4 times per day, Annita Brod, MD, 15 mL at 04/30/22 2210   Oral care mouth rinse, 15 mL, Mouth Rinse, PRN, Annita Brod, MD   [COMPLETED]  thiamine (VITAMIN B1) 500 mg in normal saline (50 mL) IVPB, 500 mg, Intravenous, Daily, Stopped at 04/25/22 1029 **FOLLOWED BY** thiamine (VITAMIN B1) injection 100 mg, 100 mg, Intravenous, Daily, Gevena Barre K, MD, 100 mg at 04/30/22 1142 Labs CBC    Component Value Date/Time   WBC 12.9 (H) 05/01/2022 0610   RBC 2.83 (L) 05/01/2022 0611   RBC 2.85 (L) 05/01/2022 0610   HGB 9.2 (L) 05/01/2022 0610   HCT 29.1 (L) 05/01/2022 0610   PLT 585 (H) 05/01/2022 0610   MCV 102.1 (H) 05/01/2022 0610   MCH 32.3 05/01/2022 0610   MCHC 31.6 05/01/2022 0610   RDW 13.1 05/01/2022 0610   LYMPHSABS 1.7 04/29/2022 0559   MONOABS 1.2 (H) 04/29/2022 0559   EOSABS 0.9 (H) 04/29/2022 0559   BASOSABS 0.1 04/29/2022 0559    CMP     Component Value Date/Time   NA 140 05/01/2022 0610   NA 142 03/29/2019 0000   K 4.0 05/01/2022 0610   CL 107 05/01/2022 0610   CO2 24 05/01/2022 0610   GLUCOSE 244 (H) 05/01/2022 0610   BUN 22 05/01/2022 0610   BUN 12 03/29/2019 0000   CREATININE 1.76 (H) 05/01/2022 0610   CALCIUM 8.6 (L) 05/01/2022 0610   PROT 6.3 (L) 05/01/2022 0610   ALBUMIN 2.2 (L) 05/01/2022 0610   AST 19 05/01/2022 0610   ALT 7 05/01/2022 0610   ALKPHOS 81 05/01/2022 0610   BILITOT 0.3 05/01/2022 0610   GFRNONAA 31 (L) 05/01/2022 0610   GFRAA 18 (L) 03/27/2017 0449  TSH 1.9 B12 Imaging I have reviewed the images obtained: MR brain with no acute injuries.  Advanced diffuse volume loss for age with periventricular white matter hyperintensities-nonspecific but can be seen in chronic alcoholic encephalopathy in the appropriate context.  Assessment: 67 year old with past history of diabetes, excessive alcohol use amongst other comorbidities presented for evaluation of confusion, initially noted to be hyperglycemic and admitted for treatment but remained extensively encephalopathic.  Has been on IV thiamine since admission.  Examination reveals patient with confusion, perseveration and at times  confabulation.  No eye movement abnormalities noted.  She does have some tremors and mild ataxia.  I suspect that her clinical picture, imaging findings and clinical history point this towards being alcohol related Wernicke Korsakoff encephalopathy and possibly progression towards irreversible damage having been caused by alcohol abuse as well as diabetes.  Reversible etiology workup is underway and if any of those unremarkable and can be reversed, that might give her the best chance of having any recovery but I am not sure at this point that she has chances of good meaningful recovery from this progressive degenerative condition.  Recommendations: Vitamin D and B12 levels pending. Will recommend keeping B12 levels greater than 400. Routine EEG pending-low suspicion for seizures but 1 to make sure there are no electrographic abnormalities explaining her current presentation. Plan discussed with Dr. Reesa Chew. I will follow EEG results  with you. -- Amie Portland, MD Neurologist [facility] Pager: [phone]

## 2022-05-01 NOTE — Plan of Care (Signed)
Patient alert and oriented to self only, VSS throughout shift.  All meds given on time as ordered.  Denied pain and SOB.  Diminished lungs, IS encouraged. Pt too weak to standby assist to bedside commode.  Incontinent of bowel and bladder.  Pt yells out in confusion but easily forgets when staff console her.  POC maintained, will continue to monitor.  Problem: Education: Goal: Ability to describe self-care measures that may prevent or decrease complications (Diabetes Survival Skills Education) will improve Outcome: Progressing Goal: Individualized Educational Video(s) Outcome: Progressing   Problem: Coping: Goal: Ability to adjust to condition or change in health will improve Outcome: Progressing   Problem: Fluid Volume: Goal: Ability to maintain a balanced intake and output will improve Outcome: Progressing   Problem: Health Behavior/Discharge Planning: Goal: Ability to identify and utilize available resources and services will improve Outcome: Progressing Goal: Ability to manage health-related needs will improve Outcome: Progressing   Problem: Metabolic: Goal: Ability to maintain appropriate glucose levels will improve Outcome: Progressing   Problem: Nutritional: Goal: Maintenance of adequate nutrition will improve Outcome: Progressing Goal: Progress toward achieving an optimal weight will improve Outcome: Progressing   Problem: Skin Integrity: Goal: Risk for impaired skin integrity will decrease Outcome: Progressing   Problem: Tissue Perfusion: Goal: Adequacy of tissue perfusion will improve Outcome: Progressing   Problem: Education: Goal: Knowledge of General Education information will improve Description: Including pain rating scale, medication(s)/side effects and non-pharmacologic comfort measures Outcome: Progressing   Problem: Health Behavior/Discharge Planning: Goal: Ability to manage health-related needs will improve Outcome: Progressing   Problem: Clinical  Measurements: Goal: Ability to maintain clinical measurements within normal limits will improve Outcome: Progressing Goal: Will remain free from infection Outcome: Progressing Goal: Diagnostic test results will improve Outcome: Progressing Goal: Respiratory complications will improve Outcome: Progressing Goal: Cardiovascular complication will be avoided Outcome: Progressing   Problem: Activity: Goal: Risk for activity intolerance will decrease Outcome: Progressing   Problem: Nutrition: Goal: Adequate nutrition will be maintained Outcome: Progressing   Problem: Coping: Goal: Level of anxiety will decrease Outcome: Progressing   Problem: Elimination: Goal: Will not experience complications related to bowel motility Outcome: Progressing Goal: Will not experience complications related to urinary retention Outcome: Progressing   Problem: Pain Managment: Goal: General experience of comfort will improve Outcome: Progressing   Problem: Safety: Goal: Ability to remain free from injury will improve Outcome: Progressing   Problem: Skin Integrity: Goal: Risk for impaired skin integrity will decrease Outcome: Progressing

## 2022-05-01 NOTE — Plan of Care (Signed)
Patient alert and oriented to self only, VSS throughout shift.  Denied pain and SOB.  All meds given on time as ordered.  Diminished lungs, IS encouraged.  PEG in place, Tfs ongoing and at goal rate now.  Dressing around PEG tube.  Bladder scan 368ml, provider notified and foley placed since it would have been patient's 3rd time for an I&O cath.  POC maintained, will continue to monitor.  Problem: Education: Goal: Ability to describe self-care measures that may prevent or decrease complications (Diabetes Survival Skills Education) will improve 05/01/2022 0437 by Marjory Sneddon, RN Outcome: Progressing 05/01/2022 0429 by Marjory Sneddon, RN Outcome: Progressing Goal: Individualized Educational Video(s) 05/01/2022 0437 by Marjory Sneddon, RN Outcome: Progressing 05/01/2022 0429 by Marjory Sneddon, RN Outcome: Progressing   Problem: Coping: Goal: Ability to adjust to condition or change in health will improve 05/01/2022 0437 by Marjory Sneddon, RN Outcome: Progressing 05/01/2022 0429 by Marjory Sneddon, RN Outcome: Progressing   Problem: Fluid Volume: Goal: Ability to maintain a balanced intake and output will improve 05/01/2022 0437 by Marjory Sneddon, RN Outcome: Progressing 05/01/2022 0429 by Marjory Sneddon, RN Outcome: Progressing   Problem: Health Behavior/Discharge Planning: Goal: Ability to identify and utilize available resources and services will improve 05/01/2022 0437 by Marjory Sneddon, RN Outcome: Progressing 05/01/2022 0429 by Marjory Sneddon, RN Outcome: Progressing Goal: Ability to manage health-related needs will improve 05/01/2022 0437 by Marjory Sneddon, RN Outcome: Progressing 05/01/2022 0429 by Marjory Sneddon, RN Outcome: Progressing   Problem: Metabolic: Goal: Ability to maintain appropriate glucose levels will improve 05/01/2022 0437 by Marjory Sneddon, RN Outcome: Progressing 05/01/2022 0429 by Marjory Sneddon, RN Outcome: Progressing   Problem: Nutritional: Goal: Maintenance of  adequate nutrition will improve 05/01/2022 0437 by Marjory Sneddon, RN Outcome: Progressing 05/01/2022 0429 by Marjory Sneddon, RN Outcome: Progressing Goal: Progress toward achieving an optimal weight will improve 05/01/2022 0437 by Marjory Sneddon, RN Outcome: Progressing 05/01/2022 0429 by Marjory Sneddon, RN Outcome: Progressing   Problem: Skin Integrity: Goal: Risk for impaired skin integrity will decrease 05/01/2022 0437 by Marjory Sneddon, RN Outcome: Progressing 05/01/2022 0429 by Marjory Sneddon, RN Outcome: Progressing   Problem: Tissue Perfusion: Goal: Adequacy of tissue perfusion will improve 05/01/2022 0437 by Marjory Sneddon, RN Outcome: Progressing 05/01/2022 0429 by Marjory Sneddon, RN Outcome: Progressing   Problem: Education: Goal: Knowledge of General Education information will improve Description: Including pain rating scale, medication(s)/side effects and non-pharmacologic comfort measures 05/01/2022 0437 by Marjory Sneddon, RN Outcome: Progressing 05/01/2022 0429 by Marjory Sneddon, RN Outcome: Progressing   Problem: Health Behavior/Discharge Planning: Goal: Ability to manage health-related needs will improve 05/01/2022 0437 by Marjory Sneddon, RN Outcome: Progressing 05/01/2022 0429 by Marjory Sneddon, RN Outcome: Progressing   Problem: Clinical Measurements: Goal: Ability to maintain clinical measurements within normal limits will improve 05/01/2022 0437 by Marjory Sneddon, RN Outcome: Progressing 05/01/2022 0429 by Marjory Sneddon, RN Outcome: Progressing Goal: Will remain free from infection 05/01/2022 0437 by Marjory Sneddon, RN Outcome: Progressing 05/01/2022 0429 by Marjory Sneddon, RN Outcome: Progressing Goal: Diagnostic test results will improve 05/01/2022 0437 by Marjory Sneddon, RN Outcome: Progressing 05/01/2022 0429 by Marjory Sneddon, RN Outcome: Progressing Goal: Respiratory complications will improve 05/01/2022 0437 by Marjory Sneddon, RN Outcome: Progressing 05/01/2022 0429  by Marjory Sneddon, RN Outcome: Progressing Goal: Cardiovascular complication will be avoided 05/01/2022 0437 by Marjory Sneddon, RN Outcome: Progressing 05/01/2022 0429 by Marjory Sneddon, RN Outcome: Progressing   Problem: Activity: Goal: Risk for activity intolerance will decrease 05/01/2022 0437 by Marjory Sneddon, RN Outcome: Progressing 05/01/2022 [phone]  by Marjory Sneddon, RN Outcome: Progressing   Problem: Nutrition: Goal: Adequate nutrition will be maintained 05/01/2022 0437 by Marjory Sneddon, RN Outcome: Progressing 05/01/2022 0429 by Marjory Sneddon, RN Outcome: Progressing   Problem: Coping: Goal: Level of anxiety will decrease 05/01/2022 0437 by Marjory Sneddon, RN Outcome: Progressing 05/01/2022 0429 by Marjory Sneddon, RN Outcome: Progressing   Problem: Elimination: Goal: Will not experience complications related to bowel motility 05/01/2022 0437 by Marjory Sneddon, RN Outcome: Progressing 05/01/2022 0429 by Marjory Sneddon, RN Outcome: Progressing Goal: Will not experience complications related to urinary retention 05/01/2022 0437 by Marjory Sneddon, RN Outcome: Progressing 05/01/2022 0429 by Marjory Sneddon, RN Outcome: Progressing   Problem: Pain Managment: Goal: General experience of comfort will improve 05/01/2022 0437 by Marjory Sneddon, RN Outcome: Progressing 05/01/2022 0429 by Marjory Sneddon, RN Outcome: Progressing   Problem: Safety: Goal: Ability to remain free from injury will improve 05/01/2022 0437 by Marjory Sneddon, RN Outcome: Progressing 05/01/2022 0429 by Marjory Sneddon, RN Outcome: Progressing   Problem: Skin Integrity: Goal: Risk for impaired skin integrity will decrease 05/01/2022 0437 by Marjory Sneddon, RN Outcome: Progressing 05/01/2022 0429 by Marjory Sneddon, RN Outcome: Progressing

## 2022-05-01 NOTE — Procedures (Signed)
Patient Name: Laurie Lin  MRN: [id number]  Epilepsy Attending: Lora Havens  Referring Physician/Provider: Amie Portland, MD  Date: 05/01/2022 Duration: 25.58 mins  Patient history: 67 year old female with altered mental status.  EEG to evaluate for seizure.  Level of alertness: Awake, asleep  AEDs during EEG study: None  Technical aspects: This EEG study was done with scalp electrodes positioned according to the 10-20 International system of electrode placement. Electrical activity was reviewed with band pass filter of 1-70Hz , sensitivity of 7 uV/mm, display speed of 8mm/sec with a 60Hz  notched filter applied as appropriate. EEG data were recorded continuously and digitally stored.  Video monitoring was available and reviewed as appropriate.  Description: The posterior dominant rhythm consists of 7.5 Hz activity of moderate voltage (25-35 uV) seen predominantly in posterior head regions, symmetric and reactive to eye opening and eye closing. Sleep was characterized by vertex waves, sleep spindles (12 to 14 Hz), maximal frontocentral region. EEG showed intermittent generalized 3 to 6 Hz theta-delta slowing. Hyperventilation and photic stimulation were not performed.     ABNORMALITY - Intermittent slow, generalized  IMPRESSION: This study is suggestive of mild diffuse encephalopathy, nonspecific etiology. No seizures or epileptiform discharges were seen throughout the recording.  [name]

## 2022-05-01 NOTE — Progress Notes (Signed)
Eeg done 

## 2022-05-02 ENCOUNTER — Inpatient Hospital Stay: Payer: Medicare Other

## 2022-05-02 DIAGNOSIS — G9340 Encephalopathy, unspecified: Secondary | ICD-10-CM | POA: Diagnosis not present

## 2022-05-02 LAB — GLUCOSE, CAPILLARY
Glucose-Capillary: 158 mg/dL — ABNORMAL HIGH (ref 70–99)
Glucose-Capillary: 161 mg/dL — ABNORMAL HIGH (ref 70–99)
Glucose-Capillary: 185 mg/dL — ABNORMAL HIGH (ref 70–99)
Glucose-Capillary: 195 mg/dL — ABNORMAL HIGH (ref 70–99)
Glucose-Capillary: 236 mg/dL — ABNORMAL HIGH (ref 70–99)
Glucose-Capillary: 255 mg/dL — ABNORMAL HIGH (ref 70–99)

## 2022-05-02 MED ORDER — VITAMIN B-12 1000 MCG PO TABS
1000.0000 ug | ORAL_TABLET | Freq: Every day | ORAL | Status: DC
  Filled 2022-05-02: qty 1

## 2022-05-02 MED ORDER — CYANOCOBALAMIN 1000 MCG/ML IJ SOLN
1000.0000 ug | Freq: Every day | INTRAMUSCULAR | Status: AC
  Administered 2022-05-02 – 2022-05-04 (×3): 1000 ug via INTRAMUSCULAR
  Filled 2022-05-02 (×3): qty 1

## 2022-05-02 NOTE — Inpatient Diabetes Management (Addendum)
Inpatient Diabetes Program Recommendations  AACE/ADA: New Consensus Statement on Inpatient Glycemic Control (2015)  Target Ranges:  Prepandial:   less than 140 mg/dL      Peak postprandial:   less than 180 mg/dL (1-2 hours)      Critically ill patients:  140 - 180 mg/dL    Latest Reference Range & Units 05/01/22 00:21 05/01/22 04:47 05/01/22 07:52 05/01/22 12:36 05/01/22 16:12 05/01/22 21:28  Glucose-Capillary 70 - 99 mg/dL 190 (H)  4 units Novolog 213 (H)  7 units Novolog 202 (H)  7 units Novolog  12 units Semglee 204 (H)  7 units Novolog 100 (H) 290 (H)  11 units Novolog   (H): Data is abnormally high  Latest Reference Range & Units 05/02/22 00:22 05/02/22 04:25 05/02/22 07:55  Glucose-Capillary 70 - 99 mg/dL 195 (H)  4 units Novolog 185 (H)  4 units Novolog 236 (H)  7 units Novolog  12 units Semglee  (H): Data is abnormally high   Diabetes history: DM1 (does not make any insulin; requires basal, correction, and carbohydrate coverage insulin)      Home DM Meds: Basaglar 12 units QHS     Novolog 0-9 units TID with meals      Current Orders: Semglee 12 units Daily                            Novolog Resistant Correction Scale/ SSI (0-20 units) Q4 hours           MD- Note pt getting Osmolite tube feeds    Tube feed rate 50cc/hr   Once tube feeds reach goal rate of 60cc/hr, please add Novolog Tube Feed Coverage: Novolog 4 units Q4 hours  HOLD if tube Feeds HELD for any reason       --Will follow patient during hospitalization--   Wyn Quaker RN, MSN, [place] [facility] Coordinator Inpatient Glycemic Control Team Team Pager: [phone] (8a-5p)

## 2022-05-02 NOTE — Care Management Important Message (Signed)
Important Message  Patient Details  Name: Laurie Lin MRN: [id number] Date of Birth: 04/25/1955   Medicare Important Message Given:  Yes     [name] 05/02/2022, 11:17 AM

## 2022-05-02 NOTE — Progress Notes (Signed)
Neurology Progress Note   S:// Patient seen and examined.  No acute changes.   O:// Current vital signs: BP 131/71 (BP Location: Left Arm)   Pulse (!) 106   Temp 99.3 F (37.4 C)   Resp 19   Ht 5\' 10"  (1.778 m)   Wt 86.9 kg   LMP 09/09/2004 (Approximate)   SpO2 99%   BMI 27.49 kg/m  Vital signs in last 24 hours: Temp:  [98.6 F (37 C)-99.5 F (37.5 C)] 99.3 F (37.4 C) (01/12 0747) Pulse Rate:  [85-108] 106 (01/12 0747) Resp:  [17-20] 19 (01/12 0747) BP: (122-134)/(52-71) 131/71 (01/12 0747) SpO2:  [94 %-99 %] 99 % (01/12 0747) Weight:  [86.9 kg] 86.9 kg (01/12 0500) General: Awake alert no distress HEENT: Normocephalic atraumatic Lungs: Clear Cardiovascular: Regular rhythm Neurological exam Awake alert oriented to self Mildly dysarthric Was able to tell me her name Unable to tell correct age, place or date of birth. Inconsistently following simple commands Mimics some actions but not consistently Does not reliably follow commands Cannot repeat Poor attention concentration Cranial nerves II to XII appear intact Motor strength in all 4 extremities seems symmetric and full Sensation intact Coordination: Mild resting tremor in both upper extremities, difficult to assess finger-nose testing reliably due to her inability to follow commands.  Unchanged exam Medications  Current Facility-Administered Medications:    0.9 %  sodium chloride infusion, , Intravenous, PRN, Annita Brod, MD, Last Rate: 10 mL/hr at 04/30/22 2019, Infusion Verify at 04/30/22 2019   acetaminophen (TYLENOL) tablet 650 mg, 650 mg, Per Tube, Q6H PRN, 650 mg at 04/28/22 1132 **OR** acetaminophen (TYLENOL) suppository 650 mg, 650 mg, Rectal, Q6H PRN, Annita Brod, MD   ceFAZolin (ANCEF) IVPB 2g/100 mL premix, 2 g, Intravenous, On Call, Lura Em, PA   Chlorhexidine Gluconate Cloth 2 % PADS 6 each, 6 each, Topical, Daily, Annita Brod, MD, 6 each at 05/01/22 0954    cyanocobalamin (VITAMIN B12) injection 1,000 mcg, 1,000 mcg, Intramuscular, Q0600, Lorella Nimrod, MD, 1,000 mcg at 05/02/22 0845   [START ON 05/05/2022] cyanocobalamin (VITAMIN B12) tablet 1,000 mcg, 1,000 mcg, Oral, Daily, Amin, Soundra Pilon, MD   enoxaparin (LOVENOX) injection 40 mg, 40 mg, Subcutaneous, Q24H, Gevena Barre K, MD, 40 mg at 05/02/22 0846   feeding supplement (OSMOLITE 1.5 CAL) liquid 1,000 mL, 1,000 mL, Per Tube, Continuous, Lorella Nimrod, MD, Last Rate: 20 mL/hr at 04/30/22 2019, Infusion Verify at 04/30/22 2019   feeding supplement (PROSource TF20) liquid 60 mL, 60 mL, Per Tube, Daily, Lorella Nimrod, MD, 60 mL at 65/03/54 6568   folic acid injection 1 mg, 1 mg, Intravenous, Daily, Gevena Barre K, MD, 1 mg at 05/01/22 1302   free water 140 mL, 140 mL, Per Tube, Q4H, Amin, Soundra Pilon, MD, 140 mL at 05/02/22 0847   hydrOXYzine (ATARAX) tablet 10 mg, 10 mg, Per Tube, TID PRN, Annita Brod, MD, 10 mg at 05/01/22 1005   insulin aspart (novoLOG) injection 0-20 Units, 0-20 Units, Subcutaneous, Q4H, Annita Brod, MD, 7 Units at 05/02/22 0847   insulin glargine-yfgn (SEMGLEE) injection 12 Units, 12 Units, Subcutaneous, Daily, Annita Brod, MD, 12 Units at 05/02/22 0846   labetalol (NORMODYNE) injection 10 mg, 10 mg, Intravenous, Q2H PRN, Annita Brod, MD, 10 mg at 04/26/22 1114   magnesium hydroxide (MILK OF MAGNESIA) suspension 30 mL, 30 mL, Per Tube, Daily PRN, Annita Brod, MD, 30 mL at 05/01/22 1425   metoCLOPramide (REGLAN) injection 5 mg,  5 mg, Intravenous, Q8H, Gevena Barre K, MD, 5 mg at 05/02/22 0601   metoprolol tartrate (LOPRESSOR) injection 5 mg, 5 mg, Intravenous, Q6H, Annita Brod, MD, 5 mg at 05/02/22 0601   ondansetron (ZOFRAN) tablet 4 mg, 4 mg, Per Tube, Q6H PRN **OR** ondansetron (ZOFRAN) injection 4 mg, 4 mg, Intravenous, Q6H PRN, Annita Brod, MD, 4 mg at 04/28/22 1114   Oral care mouth rinse, 15 mL, Mouth Rinse, 4 times per  day, Annita Brod, MD, 15 mL at 05/01/22 2230   Oral care mouth rinse, 15 mL, Mouth Rinse, PRN, Annita Brod, MD   [COMPLETED] thiamine (VITAMIN B1) 500 mg in normal saline (50 mL) IVPB, 500 mg, Intravenous, Daily, Stopped at 04/25/22 1029 **FOLLOWED BY** thiamine (VITAMIN B1) injection 100 mg, 100 mg, Intravenous, Daily, Gevena Barre K, MD, 100 mg at 05/01/22 1256 Labs CBC    Component Value Date/Time   WBC 12.9 (H) 05/01/2022 0610   RBC 2.83 (L) 05/01/2022 0611   RBC 2.85 (L) 05/01/2022 0610   HGB 9.2 (L) 05/01/2022 0610   HCT 29.1 (L) 05/01/2022 0610   PLT 585 (H) 05/01/2022 0610   MCV 102.1 (H) 05/01/2022 0610   MCH 32.3 05/01/2022 0610   MCHC 31.6 05/01/2022 0610   RDW 13.1 05/01/2022 0610   LYMPHSABS 1.7 04/29/2022 0559   MONOABS 1.2 (H) 04/29/2022 0559   EOSABS 0.9 (H) 04/29/2022 0559   BASOSABS 0.1 04/29/2022 0559    CMP     Component Value Date/Time   NA 140 05/01/2022 0610   NA 142 03/29/2019 0000   K 4.0 05/01/2022 0610   CL 107 05/01/2022 0610   CO2 24 05/01/2022 0610   GLUCOSE 244 (H) 05/01/2022 0610   BUN 22 05/01/2022 0610   BUN 12 03/29/2019 0000   CREATININE 1.76 (H) 05/01/2022 0610   CALCIUM 8.6 (L) 05/01/2022 0610   PROT 6.3 (L) 05/01/2022 0610   ALBUMIN 2.2 (L) 05/01/2022 0610   AST 19 05/01/2022 0610   ALT 7 05/01/2022 0610   ALKPHOS 81 05/01/2022 0610   BILITOT 0.3 05/01/2022 0610   GFRNONAA 31 (L) 05/01/2022 0610   GFRAA 18 (L) 03/27/2017 0449  TSH 1.9 B12-251 Vitamin D-pending  Imaging I have reviewed the images obtained: MR brain with no acute injuries.  Advanced diffuse volume loss for age with periventricular white matter hyperintensities-nonspecific but can be seen in chronic alcoholic encephalopathy in the appropriate context.  Routine EEG: Mild diffuse encephalopathy.  Assessment: 67 year old with past history of diabetes, excessive alcohol use amongst other comorbidities presented for evaluation of confusion,  initially noted to be hyperglycemic and admitted for treatment but remained extensively encephalopathic.  Has been on IV thiamine since admission.  Examination reveals patient with confusion, perseveration and at times confabulation.  No eye movement abnormalities noted.  She does have some tremors and mild ataxia.  I suspect that her clinical picture, imaging findings and clinical history point this towards being alcohol related Wernicke Korsakoff encephalopathy and possibly progression towards irreversible damage having been caused by alcohol abuse as well as diabetes.  I am not sure at this point that she has chances of good meaningful recovery from this progressive degenerative condition.  Impression Wernicke Korsakoff syndrome  Recommendations: Vitamin B12 is deficient.  Will benefit from parenteral supplementation. Will recommend keeping B12 levels greater than 400. Routine EEG with no evidence of seizures, only shows mild diffuse encephalopathy Vitamin D pending-replenishment per primary team if deficient. Outpatient follow-up in 8  to 12 weeks Plan discussed with Dr. Reesa Chew. Inpatient neurology will sign off for now.  Please call with questions as needed. -- Amie Portland, MD Neurologist [facility] Pager: [phone]

## 2022-05-02 NOTE — Progress Notes (Signed)
Progress Note   Patient: Laurie Lin SJG:[id number] DOB: July 19, 1955 DOA: 04/17/2022     14 DOS: the patient was seen and examined on 05/02/2022   Brief hospital course: 27yof w/ diabetes mellitus type 1, stage IIIa chronic kidney disease, hypertension and ongoing alcohol use who was brought in for worsening confusion for the last month.  Workup revealed urinary tract infection, acute kidney injury, hypomagnesemia and hyperkalemia.  MRI unremarkable for CVA. On evening of 12/30, patient started becoming slightly more agitated requiring Ativan.  As 12/31 progressed, patient became even more agitated despite Ativan.  Discussed with critical care.  Patient transferred to critical care service to the ICU and started on Precedex drip.  With worsening agitation, patient intubated on 1/1.  By 1/3, able to be weaned off of Precedex and able to be extubated on 1/4.  Patient also with Klebsiella UTI, treated with course of Rocephin.  [facility] assumed care 1/5 and patient started on IV antibiotics for aspiration pneumonia.  Mentation waxing and waning and unable to clear swallow evaluation.  Patient currently on tube feeds.further discussion was had w/ family and proceeding with PEG tube placement.  1/8: Mild tachycardia and low 100s, labs with improvement in leukocytosis.  BMP with slight worsening of creatinine to 1.53.  Increasing free water intake through NG tube.  CBG remained elevated, adding NovoLog 7 unit every 4 hourly if continued to receive tube feeding, discontinued later as patient developed some hypoglycemia. Patient had some nausea and vomiting so tube feed is currently being held. Going for PEG tube placement tomorrow.  1/9: Patient had her PEG tube placed today by IR.  Remained quite encephalopathic and not following any commands.  She did received high-dose thiamine for concern of Wernicke's encephalopathy during current hospitalization.  Imaging was negative.  Did received multiple  courses of antibiotics for concern of bronchitis followed by UTI followed by aspiration pneumonia. Consulted neurology for persistent encephalopathy. PEG tube will be ready for medications around 6 PM and to start feed tomorrow morning.  1/10: Patient more alert and able to communicate to some extent.  Not following commands very consistently.  Still unable to void after removing Foley catheter yesterday afternoon.  Requiring in and out catheter x 1 overnight and the second time this morning.  We will try to encourage voiding and if still unable to void then might need long term Foley catheter. Tube feeding was resumed through PEG tube today. Concern of Wernicke's/Korsakoff syndrome, might have some irreversible damage due to excessive alcohol use.  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  Neurology is recommending checking B12 and to keep it above 400.  They also ordered routine EEG although there was no underlying seizure-like activity noted. Ordered a.m. labs with anemia panel.  1/11: Vital stable labs with some decrease in leukocytosis to 12.9 and hemoglobin decreased to 9.2, all cell lines decreased, most likely some dilutional effect.  Slight worsening of creatinine to 1.76, ordered renal ultrasound and it was unremarkable.  Anemia panel with normal ferritin, folate and iron studies consistent with anemia of chronic disease with mildly low iron.  B12 and vitamin D Hypomagnesemia with magnesium of 1.6 which is being repleted Will complete the course of antibiotic today for aspiration pneumonia. Foley catheter was replaced overnight due to inability to void and requiring multiple in and out catheterization. EEG with diffuse encephalopathy and no  concern of seizures.  1/12:Vital stable, B12 at 251, goal above 400, ordered 3 doses of IM B12 1000 mg, followed by daily  p.o. vitamin D levels pending.  Going for barium swallow studies today.  No change in mental status.  Most likely has progressed to reversible damage with Wernicke's/Korsakoff syndrome  Assessment and Plan: * Encephalopathy acute MRI ruled out CVA, but does note significant wear and tear disproportionate for patient's age.  Discussed with the patient's husband and he understands.  If patient not improved, husband is interested in a long-term care facility.  Decompensated requiring Precedex drip and mechanical intubation.  Patient able to be weaned off Precedex and extubated 1/4.  She is now more appropriate and still weak and still  confused, but answering some questions.  PT and OT recommending CIR, but patient not felt to be good candidate for CIR given waxing waning mental status, so would need skilled nursing.  At this time, it is unclear how much of her mentation issues are going to be long-term.  Concern of Warnicke's-Korsakoff syndrome and irreversible damage due to excessive alcohol use. EEG was ordered by neurology although there was no seizure-like activity noted, and it shows diffuse encephalopathy with no seizure-like activity. Persistent encephalopathy and unable to participate with care.    Aspiration pneumonia ([place]) Patient noted to have some low-grade temperature, increased white blood cell count and increased procalcitonin.  Chest x-ray noted new infiltrate, suspicious for aspiration pneumonia.  Started antibiotics and labs are better today.  Speech therapy saw patient and due to mentation, not felt to be cleared for diet.   PEG tube was placed yesterday and tube feeding resumed today. -Completed a course of antibiotic  Acute renal failure superimposed on stage 3a chronic kidney disease ([place]) Most likely prerenal, creatinine fluctuating. -Monitor renal function -Avoid nephrotoxins  Alcohol abuse According to husband, patient was drinking 7-8 beers per day and has not had any  alcohol in at least several days before coming in.  Status post full withdrawals.  Now on vitamin supplementation  Type 1 diabetes mellitus with hyperglycemia ([place]) - The patient will be placed on supplement coverage with NovoLog and we will continue her basal coverage.  A1c notes moderate control at 7.2, although I suspect some of her overall " controlled blood sugars" are due to poor calorie intake secondary to alcoholism.  CBGs initially in the 300-400s.  Not in DKA.  Appreciate diabetes coordinator help.  Now with sliding scale and subcu insulin.  Bronchitis-resolved as of 04/25/2022 Completed course of antibiotics to Rocephin and Zithromax  UTI due to Klebsiella species-resolved as of 04/25/2022 Urine cultures growing out pansensitive Klebsiella.  Completed course of Rocephin  Essential hypertension BNP elevated at 356 and blood pressures were starting to trend upward.  Echocardiogram noted normal diastolic function and ejection fraction of 50 to 55%, on the low end of normal.  Patient has received 1 dose of IV Lasix.  Will add scheduled IV Lopressor given heart rate staying in the 90s and blood pressures ranging from the 130s to 170s.  Once PEG tube in place, can change to p.o. beta-blocker  GERD without esophagitis - We will continue her PPI therapy.  Overweight (BMI 25.0-29.9) Meets criteria for BMI greater than 25  Acute respiratory failure with hypoxia (HCC)-resolved as of 04/25/2022 Secondary to agitation.  Required mechanical intubation.  Hypernatremia-resolved as of 04/25/2022 Secondary to lack of p.o. intake while she is confused.  Change IV fluids to half-normal saline.  Subjective: Patient remained pleasantly confused and still unable to follow commands consistently.  Physical Exam: Vitals:   05/02/22 0747 05/02/22 1154 05/02/22 1332 05/02/22 1540  BP: 131/71 (!) 159/67 126/64 (!) 126/57  Pulse: (!) 106 83 83 83  Resp: 19 19  17   Temp: 99.3 F (37.4 C) 98.8 F  (37.1 C)  98.4 F (36.9 C)  TempSrc:  Oral    SpO2: 99% 92%  95%  Weight:      Height:       General.  Pleasantly confused lady, in no acute distress. Pulmonary.  Lungs clear bilaterally, normal respiratory effort. CV.  Regular rate and rhythm, no JVD, rub or murmur. Abdomen.  Soft, nontender, nondistended, BS positive. CNS.  Alert and oriented to name only.  No focal neurologic deficit. Extremities.  No edema, no cyanosis, pulses intact and symmetrical. Psychiatry.  Judgment and insight appears impaired  Data Reviewed: Prior data reviewed  Family Communication: Talked with husband on phone.  Disposition: Status is: Inpatient Remains inpatient appropriate because: Severity of illness  Planned Discharge Destination: Skilled nursing facility  DVT prophylaxis.  Lovenox Time spent: 40 minutes  This record has been created using Systems analyst. Errors have been sought and corrected,but may not always be located. Such creation errors do not reflect on the standard of care.   Author: Lorella Nimrod, MD 05/02/2022 5:08 PM  For on call review [email or web address].

## 2022-05-02 NOTE — Assessment & Plan Note (Signed)
Patient noted to have some low-grade temperature, increased white blood cell count and increased procalcitonin.  Chest x-ray noted new infiltrate, suspicious for aspiration pneumonia.  Started antibiotics and labs are better today.  Speech therapy saw patient and due to mentation, not felt to be cleared for diet.   PEG tube was placed yesterday and tube feeding resumed today. -Completed a course of antibiotic

## 2022-05-02 NOTE — Progress Notes (Signed)
Modified Barium Swallow Progress Note  Patient Details  Name: Laurie Lin MRN: [id number] Date of Birth: [birth year]  Today's Date: 05/02/2022  Modified Barium Swallow completed.  Full report located under Chart Review in the Imaging Section.  Brief recommendations include the following:  Clinical Impression  Pt continues to present with profound encephalopathy, suspect Wernicke Korsakoff. Pt with bilateral mittens in place. During this study, pt presents with no change since this therapist treated pt last Friday. She continues with decreased response to PO stimuli at lips, when mitten was removed, pt was able to hold cup but demonstrated no attention to it and exhibit withdrawal when therapist provided hand over hand assistance to attempt to bring cup to her mouth. There were a few occasions that pt allowed liquid to enter her mouth. this was followed by oral holding and decreased attention to bolus. Pt was very distracted with looking around the x-ray machine and was largely non-responsive to maximal multimodal cues to swallow. Pt held boluses for upwards of 45 seconds. When swallow was initiated, it was swift with good airway protection. At this time, pt is inappropriate to return to PO intake as her main source of nutrition and hydration. Recommend ice chips after oral care to facilitate use of swallow musculature. Given pt's continued encephalopathy, she is unable to participate in skilled therapy at this time. Please re-consult when pt can consistently sustain attention to and exhibit function of a spoon or cup   Swallow Evaluation Recommendations       SLP Diet Recommendations: Ice chips PRN after oral care       Medication Administration: Via alternative means   Supervision: Full supervision/cueing for compensatory strategies   Compensations: Minimize environmental distractions;Slow rate;Small sips/bites   Postural Changes: Seated upright at 90 degrees   Oral Care  Recommendations: Oral care QID        Layia Walla 05/02/2022,9:34 PM

## 2022-05-02 NOTE — Assessment & Plan Note (Signed)
MRI ruled out CVA, but does note significant wear and tear disproportionate for patient's age.  Discussed with the patient's husband and he understands.  If patient not improved, husband is interested in a long-term care facility.  Decompensated requiring Precedex drip and mechanical intubation.  Patient able to be weaned off Precedex and extubated 1/4.  She is now more appropriate and still weak and still  confused, but answering some questions.  PT and OT recommending CIR, but patient not felt to be good candidate for CIR given waxing waning mental status, so would need skilled nursing.  At this time, it is unclear how much of her mentation issues are going to be long-term.  Concern of Warnicke's-Korsakoff syndrome and irreversible damage due to excessive alcohol use. EEG was ordered by neurology although there was no seizure-like activity noted, and it shows diffuse encephalopathy with no seizure-like activity. Persistent encephalopathy and unable to participate with care.

## 2022-05-03 DIAGNOSIS — G9340 Encephalopathy, unspecified: Secondary | ICD-10-CM | POA: Diagnosis not present

## 2022-05-03 LAB — GLUCOSE, CAPILLARY
Glucose-Capillary: 129 mg/dL — ABNORMAL HIGH (ref 70–99)
Glucose-Capillary: 165 mg/dL — ABNORMAL HIGH (ref 70–99)
Glucose-Capillary: 168 mg/dL — ABNORMAL HIGH (ref 70–99)
Glucose-Capillary: 169 mg/dL — ABNORMAL HIGH (ref 70–99)
Glucose-Capillary: 188 mg/dL — ABNORMAL HIGH (ref 70–99)
Glucose-Capillary: 226 mg/dL — ABNORMAL HIGH (ref 70–99)
Glucose-Capillary: 236 mg/dL — ABNORMAL HIGH (ref 70–99)

## 2022-05-03 MED ORDER — INSULIN ASPART 100 UNIT/ML IJ SOLN
4.0000 [IU] | Freq: Four times a day (QID) | INTRAMUSCULAR | Status: DC
  Administered 2022-05-03 – 2022-05-04 (×4): 4 [IU] via SUBCUTANEOUS
  Filled 2022-05-03 (×4): qty 1

## 2022-05-03 NOTE — Progress Notes (Signed)
Progress Note   Patient: Laurie Lin XBJ:[id number] DOB: 1955/10/11 DOA: 04/17/2022     15 DOS: the patient was seen and examined on 05/03/2022   Brief hospital course: 67yof w/ diabetes mellitus type 1, stage IIIa chronic kidney disease, hypertension and ongoing alcohol use who was brought in for worsening confusion for the last month.  Workup revealed urinary tract infection, acute kidney injury, hypomagnesemia and hyperkalemia.  MRI unremarkable for CVA. On evening of 12/30, patient started becoming slightly more agitated requiring Ativan.  As 12/31 progressed, patient became even more agitated despite Ativan.  Discussed with critical care.  Patient transferred to critical care service to the ICU and started on Precedex drip.  With worsening agitation, patient intubated on 1/1.  By 1/3, able to be weaned off of Precedex and able to be extubated on 1/4.  Patient also with Klebsiella UTI, treated with course of Rocephin.  [facility] assumed care 1/5 and patient started on IV antibiotics for aspiration pneumonia.  Mentation waxing and waning and unable to clear swallow evaluation.  Patient currently on tube feeds.further discussion was had w/ family and proceeding with PEG tube placement.  1/8: Mild tachycardia and low 100s, labs with improvement in leukocytosis.  BMP with slight worsening of creatinine to 1.53.  Increasing free water intake through NG tube.  CBG remained elevated, adding NovoLog 7 unit every 4 hourly if continued to receive tube feeding, discontinued later as patient developed some hypoglycemia. Patient had some nausea and vomiting so tube feed is currently being held. Going for PEG tube placement tomorrow.  1/9: Patient had her PEG tube placed today by IR.  Remained quite encephalopathic and not following any commands.  She did received high-dose thiamine for concern of Wernicke's encephalopathy during current hospitalization.  Imaging was negative.  Did received multiple  courses of antibiotics for concern of bronchitis followed by UTI followed by aspiration pneumonia. Consulted neurology for persistent encephalopathy. PEG tube will be ready for medications around 6 PM and to start feed tomorrow morning.  1/10: Patient more alert and able to communicate to some extent.  Not following commands very consistently.  Still unable to void after removing Foley catheter yesterday afternoon.  Requiring in and out catheter x 1 overnight and the second time this morning.  We will try to encourage voiding and if still unable to void then might need long term Foley catheter. Tube feeding was resumed through PEG tube today. Concern of Wernicke's/Korsakoff syndrome, might have some irreversible damage due to excessive alcohol use.  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  Neurology is recommending checking B12 and to keep it above 400.  They also ordered routine EEG although there was no underlying seizure-like activity noted. Ordered a.m. labs with anemia panel.  1/11: Vital stable labs with some decrease in leukocytosis to 12.9 and hemoglobin decreased to 9.2, all cell lines decreased, most likely some dilutional effect.  Slight worsening of creatinine to 1.76, ordered renal ultrasound and it was unremarkable.  Anemia panel with normal ferritin, folate and iron studies consistent with anemia of chronic disease with mildly low iron.  B12 and vitamin D Hypomagnesemia with magnesium of 1.6 which is being repleted Will complete the course of antibiotic today for aspiration pneumonia. Foley catheter was replaced overnight due to inability to void and requiring multiple in and out catheterization. EEG with diffuse encephalopathy and no  concern of seizures.  1/12:Vital stable, B12 at 251, goal above 400, ordered 3 doses of IM B12 1000 mg, followed by daily  p.o. vitamin D levels pending.  Going for barium swallow studies today.  No change in mental status.  Most likely has progressed to reversible damage with Wernicke's/Korsakoff syndrome.  1/13: Hemodynamically stable.  Barium swallow studies with very decreased response and unable to follow commands to swallow.  Keeping bolus in mouth, able to hold cup but unable to drink from it.  Swallow team is recommending just ice chips at this time to keep stimulating oral mucosa.  Patient will also need speech and swallow therapy given go to rehab. CBG elevated-added NovoLog 4 units every 6 hourly while she is on tube feed. Had 1 episode of loose bowel movement-if developed diarrhea with multiple bowel movements, might need a change in tube feed.  Assessment and Plan: * Encephalopathy acute MRI ruled out CVA, but does note significant wear and tear disproportionate for patient's age.  Discussed with the patient's husband and he understands.  If patient not improved, husband is interested in a long-term care facility.  Decompensated requiring Precedex drip and mechanical intubation.  Patient able to be weaned off Precedex and extubated 1/4.  She is now more appropriate and still weak and still  confused, but answering some questions.  PT and OT recommending CIR, but patient not felt to be good candidate for CIR given waxing waning mental status, so would need skilled nursing.  At this time, it is unclear how much of her mentation issues are going to be long-term.  Concern of Warnicke's-Korsakoff syndrome and irreversible damage due to excessive alcohol use. EEG was ordered by neurology although there was no seizure-like activity noted, and it shows diffuse encephalopathy with no seizure-like activity. Persistent encephalopathy and unable to participate with care.    Aspiration pneumonia ([place]) Patient noted to have some low-grade temperature, increased white blood cell count and increased procalcitonin.  Chest  x-ray noted new infiltrate, suspicious for aspiration pneumonia.  Started antibiotics and labs are better today.  Speech therapy saw patient and due to mentation, not felt to be cleared for diet.   PEG tube was placed yesterday and tube feeding resumed today. -Completed a course of antibiotic  Acute renal failure superimposed on stage 3a chronic kidney disease ([place]) Most likely prerenal, creatinine fluctuating. -Monitor renal function -Avoid nephrotoxins  Alcohol abuse According to husband, patient was drinking 7-8 beers per day and has not had any alcohol in at least several days before coming in.  Status post full withdrawals.  Now on vitamin supplementation  Type 1 diabetes mellitus with hyperglycemia ([place]) - The patient will be placed on supplement coverage with NovoLog and we will continue her basal coverage.  A1c notes moderate control at 7.2, although I suspect some of her overall " controlled blood sugars" are due to poor calorie intake secondary to alcoholism.  CBGs initially in the 300-400s.  Not in DKA.  Appreciate diabetes coordinator help.  Now with sliding scale and subcu insulin.  Bronchitis-resolved as of 04/25/2022 Completed course of antibiotics to Rocephin and Zithromax  UTI due to Klebsiella species-resolved as of 04/25/2022 Urine cultures growing out pansensitive Klebsiella.  Completed course of Rocephin  Essential hypertension BNP elevated at 356 and blood pressures were starting to trend upward.  Echocardiogram noted normal diastolic function and ejection fraction of 50 to 55%, on the low end of normal.  Patient has received 1 dose of IV Lasix.  Will add scheduled IV Lopressor given heart rate staying in the 90s and blood pressures ranging from the 130s to 170s.  Once PEG tube in place, can change to p.o. beta-blocker  GERD without esophagitis - We will continue her PPI therapy.  Overweight (BMI 25.0-29.9) Meets criteria for BMI greater than 25  Acute respiratory  failure with hypoxia (HCC)-resolved as of 04/25/2022 Secondary to agitation.  Required mechanical intubation.  Hypernatremia-resolved as of 04/25/2022 Secondary to lack of p.o. intake while she is confused.  Change IV fluids to half-normal saline.        Subjective: Patient remained pleasantly confused.  Had 1 loose bowel movement.  Still not following any commands consistently.  Physical Exam: Vitals:   05/03/22 0134 05/03/22 0458 05/03/22 0459 05/03/22 0837  BP: 132/69  (!) 149/64 132/73  Pulse: 84 87 81 63  Resp:  18  18  Temp:  98.9 F (37.2 C)  98.6 F (37 C)  TempSrc:  Oral    SpO2:  98%  95%  Weight:      Height:       General.  Confused lady, in no acute distress. Pulmonary.  Lungs clear bilaterally, normal respiratory effort. CV.  Regular rate and rhythm, no JVD, rub or murmur. Abdomen.  Soft, nontender, nondistended, BS positive. CNS.  Alert and oriented to self.  No focal neurologic deficit. Extremities.  No edema, no cyanosis, pulses intact and symmetrical. Psychiatry.  Judgment and insight appears impaired  Data Reviewed: Prior data reviewed  Family Communication: Discussed with sister and her husband at bedside  Disposition: Status is: Inpatient Remains inpatient appropriate because: Severity of illness  Planned Discharge Destination: Skilled nursing facility  DVT prophylaxis.  Lovenox Time spent: 42 minutes  This record has been created using Systems analyst. Errors have been sought and corrected,but may not always be located. Such creation errors do not reflect on the standard of care.   Author: Lorella Nimrod, MD 05/03/2022 2:54 PM  For on call review [email or web address].

## 2022-05-04 DIAGNOSIS — G9340 Encephalopathy, unspecified: Secondary | ICD-10-CM | POA: Diagnosis not present

## 2022-05-04 LAB — GLUCOSE, CAPILLARY
Glucose-Capillary: 192 mg/dL — ABNORMAL HIGH (ref 70–99)
Glucose-Capillary: 200 mg/dL — ABNORMAL HIGH (ref 70–99)
Glucose-Capillary: 192 mg/dL — ABNORMAL HIGH (ref 70–99)
Glucose-Capillary: 307 mg/dL — ABNORMAL HIGH (ref 70–99)
Glucose-Capillary: 226 mg/dL — ABNORMAL HIGH (ref 70–99)

## 2022-05-04 MED ORDER — INSULIN ASPART 100 UNIT/ML IJ SOLN
5.0000 [IU] | Freq: Four times a day (QID) | INTRAMUSCULAR | Status: DC
  Administered 2022-05-04 – 2022-05-05 (×5): 5 [IU] via SUBCUTANEOUS
  Filled 2022-05-04 (×5): qty 1

## 2022-05-04 NOTE — Plan of Care (Signed)
  Problem: Coping: Goal: Ability to adjust to condition or change in health will improve Outcome: Progressing   Problem: Health Behavior/Discharge Planning: Goal: Ability to identify and utilize available resources and services will improve Outcome: Progressing   Problem: Metabolic: Goal: Ability to maintain appropriate glucose levels will improve Outcome: Progressing   Problem: Skin Integrity: Goal: Risk for impaired skin integrity will decrease Outcome: Progressing   Problem: Tissue Perfusion: Goal: Adequacy of tissue perfusion will improve Outcome: Progressing

## 2022-05-04 NOTE — TOC Progression Note (Addendum)
Transition of Care [facility]) - Progression Note    Patient Details  Name: Laurie Lin MRN: [id number] Date of Birth: [birth year]  Transition of Care [facility]) CM/SW [place], LCSW Phone Number: 05/04/2022, 10:32 AM  Clinical Narrative:    CSW called patient's spouse, explained that all SNFs have declined patient so far. He stated he is agreeable to CSW extending the bed search. He stated he does not feel he can care for patient at home at this point. Bed search extended. TOC handoff updated to follow for bed offers and start insurance auth when appropriate.    Expected Discharge Plan: [place] Barriers to Discharge: Continued Medical Work up  Expected Discharge Plan and [place] arrangements for the past 2 months: Single Family Home                                       Social Determinants of Health (SDOH) Interventions SDOH Screenings   Food Insecurity: No Food Insecurity (04/19/2022)  Housing: Low Risk  (04/19/2022)  Transportation Needs: No Transportation Needs (04/19/2022)  Utilities: Not At Risk (04/19/2022)  Tobacco Use: Low Risk  (04/29/2022)    Readmission Risk Interventions     No data to display

## 2022-05-04 NOTE — Progress Notes (Signed)
Progress Note   Patient: Laurie Lin EXN:[id number] DOB: March 03, 1956 DOA: 04/17/2022     16 DOS: the patient was seen and examined on 05/04/2022   Brief hospital course: 21yof w/ diabetes mellitus type 1, stage IIIa chronic kidney disease, hypertension and ongoing alcohol use who was brought in for worsening confusion for the last month.  Workup revealed urinary tract infection, acute kidney injury, hypomagnesemia and hyperkalemia.  MRI unremarkable for CVA. On evening of 12/30, patient started becoming slightly more agitated requiring Ativan.  As 12/31 progressed, patient became even more agitated despite Ativan.  Discussed with critical care.  Patient transferred to critical care service to the ICU and started on Precedex drip.  With worsening agitation, patient intubated on 1/1.  By 1/3, able to be weaned off of Precedex and able to be extubated on 1/4.  Patient also with Klebsiella UTI, treated with course of Rocephin.  [facility] assumed care 1/5 and patient started on IV antibiotics for aspiration pneumonia.  Mentation waxing and waning and unable to clear swallow evaluation.  Patient currently on tube feeds.further discussion was had w/ family and proceeding with PEG tube placement.  1/8: Mild tachycardia and low 100s, labs with improvement in leukocytosis.  BMP with slight worsening of creatinine to 1.53.  Increasing free water intake through NG tube.  CBG remained elevated, adding NovoLog 7 unit every 4 hourly if continued to receive tube feeding, discontinued later as patient developed some hypoglycemia. Patient had some nausea and vomiting so tube feed is currently being held. Going for PEG tube placement tomorrow.  1/9: Patient had her PEG tube placed today by IR.  Remained quite encephalopathic and not following any commands.  She did received high-dose thiamine for concern of Wernicke's encephalopathy during current hospitalization.  Imaging was negative.  Did received multiple  courses of antibiotics for concern of bronchitis followed by UTI followed by aspiration pneumonia. Consulted neurology for persistent encephalopathy. PEG tube will be ready for medications around 6 PM and to start feed tomorrow morning.  1/10: Patient more alert and able to communicate to some extent.  Not following commands very consistently.  Still unable to void after removing Foley catheter yesterday afternoon.  Requiring in and out catheter x 1 overnight and the second time this morning.  We will try to encourage voiding and if still unable to void then might need long term Foley catheter. Tube feeding was resumed through PEG tube today. Concern of Wernicke's/Korsakoff syndrome, might have some irreversible damage due to excessive alcohol use.  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  Neurology is recommending checking B12 and to keep it above 400.  They also ordered routine EEG although there was no underlying seizure-like activity noted. Ordered a.m. labs with anemia panel.  1/11: Vital stable labs with some decrease in leukocytosis to 12.9 and hemoglobin decreased to 9.2, all cell lines decreased, most likely some dilutional effect.  Slight worsening of creatinine to 1.76, ordered renal ultrasound and it was unremarkable.  Anemia panel with normal ferritin, folate and iron studies consistent with anemia of chronic disease with mildly low iron.  B12 and vitamin D Hypomagnesemia with magnesium of 1.6 which is being repleted Will complete the course of antibiotic today for aspiration pneumonia. Foley catheter was replaced overnight due to inability to void and requiring multiple in and out catheterization. EEG with diffuse encephalopathy and no  concern of seizures.  1/12:Vital stable, B12 at 251, goal above 400, ordered 3 doses of IM B12 1000 mg, followed by daily  p.o. vitamin D levels pending.  Going for barium swallow studies today.  No change in mental status.  Most likely has progressed to reversible damage with Wernicke's/Korsakoff syndrome.  1/13: Hemodynamically stable.  Barium swallow studies with very decreased response and unable to follow commands to swallow.  Keeping bolus in mouth, able to hold cup but unable to drink from it.  Swallow team is recommending just ice chips at this time to keep stimulating oral mucosa.  Patient will also need speech and swallow therapy given go to rehab. CBG elevated-added NovoLog 4 units every 6 hourly while she is on tube feed. Had 1 episode of loose bowel movement-if developed diarrhea with multiple bowel movements, might need a change in tube feed.  1/14: CBG improving but still elevated, increasing NovoLog to 5 units every 6 hourly.  No other acute concerns.  Still no bed offers.  TOC expanded the search  Assessment and Plan: * Encephalopathy acute MRI ruled out CVA, but does note significant wear and tear disproportionate for patient's age.  Discussed with the patient's husband and he understands.  If patient not improved, husband is interested in a long-term care facility.  Decompensated requiring Precedex drip and mechanical intubation.  Patient able to be weaned off Precedex and extubated 1/4.  She is now more appropriate and still weak and still  confused, but answering some questions.  PT and OT recommending CIR, but patient not felt to be good candidate for CIR given waxing waning mental status, so would need skilled nursing.  At this time, it is unclear how much of her mentation issues are going to be long-term.  Concern of Warnicke's-Korsakoff syndrome and irreversible damage due to excessive alcohol use. EEG was ordered by neurology although there was no seizure-like activity noted, and it shows diffuse encephalopathy with no seizure-like activity. Persistent encephalopathy and unable to participate  with care.    Aspiration pneumonia ([place]) Patient noted to have some low-grade temperature, increased white blood cell count and increased procalcitonin.  Chest x-ray noted new infiltrate, suspicious for aspiration pneumonia.  Started antibiotics and labs are better today.  Speech therapy saw patient and due to mentation, not felt to be cleared for diet.   PEG tube was placed yesterday and tube feeding resumed today. -Completed a course of antibiotic  Acute renal failure superimposed on stage 3a chronic kidney disease ([place]) Most likely prerenal, creatinine fluctuating. -Monitor renal function -Avoid nephrotoxins  Alcohol abuse According to husband, patient was drinking 7-8 beers per day and has not had any alcohol in at least several days before coming in.  Status post full withdrawals.  Now on vitamin supplementation  Type 1 diabetes mellitus with hyperglycemia ([place]) - The patient will be placed on supplement coverage with NovoLog and we will continue her basal coverage.  A1c notes moderate control at 7.2, although I suspect some of her overall " controlled blood sugars" are due to poor calorie intake secondary to alcoholism.  CBGs initially in the 300-400s.  Not in DKA.  Appreciate diabetes coordinator help.  Now with sliding scale and subcu insulin.  Bronchitis-resolved as of 04/25/2022 Completed course of antibiotics to Rocephin and Zithromax  UTI due to Klebsiella species-resolved as of 04/25/2022 Urine cultures growing out pansensitive Klebsiella.  Completed course of Rocephin  Essential hypertension BNP elevated at 356 and blood pressures were starting to  trend upward.  Echocardiogram noted normal diastolic function and ejection fraction of 50 to 55%, on the low end of normal.  Patient has received 1 dose of IV Lasix.  Will add scheduled IV Lopressor given heart rate staying in the 90s and blood pressures ranging from the 130s to 170s.  Once PEG tube in place, can change to p.o.  beta-blocker  GERD without esophagitis - We will continue her PPI therapy.  Overweight (BMI 25.0-29.9) Meets criteria for BMI greater than 25  Acute respiratory failure with hypoxia ([facility])-resolved as of 04/25/2022 Secondary to agitation.  Required mechanical intubation.  Hypernatremia-resolved as of 04/25/2022 Secondary to lack of p.o. intake while she is confused.  Change IV fluids to half-normal saline.        Subjective: Patient remained pleasantly confused, following some commands.  Physical Exam: Vitals:   05/04/22 0056 05/04/22 0500 05/04/22 0528 05/04/22 0819  BP: (!) 127/59  (!) 138/43 (!) 116/39  Pulse: 87  87 74  Resp:   18 16  Temp:   98.7 F (37.1 C) 98.6 F (37 C)  TempSrc:   Oral   SpO2:   100%   Weight:  87.5 kg    Height:       General.  Pleasantly confused lady, in no acute distress. Pulmonary.  Lungs clear bilaterally, normal respiratory effort. CV.  Regular rate and rhythm, no JVD, rub or murmur. Abdomen.  Soft, nontender, nondistended, BS positive. CNS.  Alert and oriented to self.  No focal neurologic deficit. Extremities.  No edema, no cyanosis, pulses intact and symmetrical. Psychiatry.  Judgment and insight appears impaired.   Data Reviewed: Prior data reviewed  Family Communication: Unable to reach husband on phone, no message left due to generic voicemail  Disposition: Status is: Inpatient Remains inpatient appropriate because: Severity of illness  Planned Discharge Destination: Skilled nursing facility  DVT prophylaxis.  Lovenox Time spent: 42 minutes  This record has been created using Systems analyst. Errors have been sought and corrected,but may not always be located. Such creation errors do not reflect on the standard of care.   Author: Lorella Nimrod, MD 05/04/2022 1:48 PM  For on call review [email or web address].

## 2022-05-05 DIAGNOSIS — G9340 Encephalopathy, unspecified: Secondary | ICD-10-CM | POA: Diagnosis not present

## 2022-05-05 LAB — GLUCOSE, CAPILLARY
Glucose-Capillary: 168 mg/dL — ABNORMAL HIGH (ref 70–99)
Glucose-Capillary: 202 mg/dL — ABNORMAL HIGH (ref 70–99)
Glucose-Capillary: 209 mg/dL — ABNORMAL HIGH (ref 70–99)
Glucose-Capillary: 250 mg/dL — ABNORMAL HIGH (ref 70–99)
Glucose-Capillary: 259 mg/dL — ABNORMAL HIGH (ref 70–99)
Glucose-Capillary: 91 mg/dL (ref 70–99)

## 2022-05-05 MED ORDER — FOLIC ACID 1 MG PO TABS: 1.0000 mg | ORAL_TABLET | Freq: Every day | ORAL | 3 refills | Status: AC

## 2022-05-05 MED ORDER — METOCLOPRAMIDE HCL 5 MG/5ML PO SOLN: 5.0000 mg | Freq: Three times a day (TID) | ORAL | 0 refills | Status: DC

## 2022-05-05 MED ORDER — INSULIN ASPART 100 UNIT/ML IJ SOLN: 5.0000 [IU] | INTRAMUSCULAR | 11 refills | Status: DC

## 2022-05-05 MED ORDER — HYDROXYZINE HCL 10 MG PO TABS: 10.0000 mg | ORAL_TABLET | Freq: Three times a day (TID) | ORAL | 0 refills | Status: DC | PRN

## 2022-05-05 MED ORDER — OSMOLITE 1.5 CAL PO LIQD: 1000.0000 mL | ORAL | 0 refills | Status: DC

## 2022-05-05 MED ORDER — VITAMIN B-12 1000 MCG PO TABS
1000.0000 ug | ORAL_TABLET | Freq: Every day | ORAL | Status: DC
  Administered 2022-05-05 – 2022-05-06 (×2): 1000 ug
  Filled 2022-05-05: qty 1

## 2022-05-05 MED ORDER — TORSEMIDE 20 MG PO TABS: 20.0000 mg | ORAL_TABLET | Freq: Every day | ORAL | Status: DC | PRN

## 2022-05-05 MED ORDER — PROSOURCE TF20 ENFIT COMPATIBL EN LIQD: 60.0000 mL | Freq: Every day | ENTERAL | Status: DC

## 2022-05-05 MED ORDER — MAGNESIUM HYDROXIDE 400 MG/5ML PO SUSP: 30.0000 mL | Freq: Every day | ORAL | 0 refills | Status: DC | PRN

## 2022-05-05 MED ORDER — FREE WATER: 140.0000 mL | Status: DC

## 2022-05-05 MED ORDER — INSULIN ASPART 100 UNIT/ML IJ SOLN
5.0000 [IU] | INTRAMUSCULAR | Status: DC
  Administered 2022-05-05 – 2022-05-06 (×5): 5 [IU] via SUBCUTANEOUS
  Filled 2022-05-05 (×3): qty 1

## 2022-05-05 MED ORDER — CYANOCOBALAMIN 1000 MCG PO TABS: 1000.0000 ug | ORAL_TABLET | Freq: Every day | ORAL | Status: DC

## 2022-05-05 MED ORDER — THIAMINE HCL 100 MG PO TABS: 100.0000 mg | ORAL_TABLET | Freq: Every day | ORAL | Status: DC

## 2022-05-05 NOTE — Care Management Important Message (Signed)
Important Message  Patient Details  Name: Laurie Lin MRN: [id number] Date of Birth: [birth year]   Medicare Important Message Given:  Yes     [name] 05/05/2022, 1:59 PM

## 2022-05-05 NOTE — Progress Notes (Signed)
Physical Therapy Treatment Patient Details Name: Laurie Lin MRN: [id number] DOB: 1955-04-27 Today's Date: 05/05/2022   History of Present Illness Pt is a 67 y/o F admitted on 04/17/22 after presenting with c/o worsening confusion for the last month. Work up revealed UTI, AKI, hypomagnesemia, hyperkalemia. During admission pt became more agitated & intubated on 1/1, extubated 1/4. PEG placement on 1/9. PMH: DM1, CKD 3A, HTN, alcohol use    PT Comments    Patient supine in bed on arrival. Responds to name but unable to state name/DOB this date. Following 1 step commands <25% of time with max verbal and tactile cues. Difficulty dividing attention with sitting EOB and ADL task resulting in L lateral lean. Required maxA+2 to stand from EOB after 2nd attempt but immediately with L lateral lean. Patient initiated rocking for momentum prior to standing this date. Continue to recommend SNF for ongoing Physical Therapy.       Recommendations for follow up therapy are one component of a multi-disciplinary discharge planning process, led by the attending physician.  Recommendations may be updated based on patient status, additional functional criteria and insurance authorization.  Follow Up Recommendations  Skilled nursing-short term rehab (<3 hours/day) Can patient physically be transported by private vehicle: No   Assistance Recommended at Discharge Frequent or constant Supervision/Assistance  Patient can return home with the following Two people to help with walking and/or transfers;Direct supervision/assist for medications management;Two people to help with bathing/dressing/bathroom;Help with stairs or ramp for entrance;Assist for transportation;Assistance with feeding;Direct supervision/assist for financial management;Assistance with cooking/housework   Equipment Recommendations  Other (comment) (TBD)    Recommendations for Other Services       Precautions / Restrictions  Precautions Precautions: Fall Precaution Comments: PEG, mittens, foley Restrictions Weight Bearing Restrictions: No     Mobility  Bed Mobility Overal bed mobility: Needs Assistance Bed Mobility: Supine to Sit, Sit to Supine     Supine to sit: Total assist, +2 for physical assistance, +2 for safety/equipment Sit to supine: Total assist, +2 for safety/equipment, +2 for physical assistance        Transfers Overall transfer level: Needs assistance Equipment used: 2 person hand held assist Transfers: Sit to/from Stand Sit to Stand: Max assist, +2 safety/equipment, +2 physical assistance           General transfer comment: patient initiating rocking prior to standing but on first attempt required totalA+2 with inability to clear buttocks but with poor activation from patient. On second attempt, able to stand with maxA+2 to come into standing but immediately with L lateral lean    Ambulation/Gait                   Stairs             Wheelchair Mobility    Modified Rankin (Stroke Patients Only)       Balance Overall balance assessment: Needs assistance Sitting-balance support: Feet supported, Bilateral upper extremity supported Sitting balance-Leahy Scale: Fair Sitting balance - Comments: L lateral lean with fatigue and dividing attention to another task Postural control: Left lateral lean Standing balance support: Bilateral upper extremity supported, Reliant on assistive device for balance Standing balance-Leahy Scale: Poor                              Cognition Arousal/Alertness: Awake/alert Behavior During Therapy: Flat affect Overall Cognitive Status: Impaired/Different from baseline Area of Impairment: Orientation, Attention, Memory, Following commands, Safety/judgement, Awareness,  Problem solving                 Orientation Level: Disoriented to, Place, Time, Situation Current Attention Level: Focused Memory: Decreased  short-term memory Following Commands: Follows one step commands inconsistently Safety/Judgement: Decreased awareness of safety, Decreased awareness of deficits Awareness: Intellectual Problem Solving: Slow processing, Decreased initiation, Difficulty sequencing, Requires tactile cues, Requires verbal cues General Comments: answers to name but unable to verbalize name/DOB. Following 1 step commands <25% of session with visual and tactile cueing. Unable to perform ADL task and maintain sitting balance simultaneously.        Exercises      General Comments        Pertinent Vitals/Pain Pain Assessment Pain Assessment: Faces Faces Pain Scale: No hurt Pain Intervention(s): Monitored during session    Home Living                          Prior Function            PT Goals (current goals can now be found in the care plan section) Acute Rehab PT Goals Patient Stated Goal: did not state PT Goal Formulation: With family Time For Goal Achievement: 05/10/22 Potential to Achieve Goals: Fair Progress towards PT goals: Progressing toward goals    Frequency    Min 2X/week      PT Plan Current plan remains appropriate    Co-evaluation PT/OT/SLP Co-Evaluation/Treatment: Yes Reason for Co-Treatment: For patient/therapist safety;To address functional/ADL transfers;Complexity of the patient's impairments (multi-system involvement);Necessary to address cognition/behavior during functional activity PT goals addressed during session: Mobility/safety with mobility;Balance        AM-PAC PT "6 Clicks" Mobility   Outcome Measure  Help needed turning from your back to your side while in a flat bed without using bedrails?: Total Help needed moving from lying on your back to sitting on the side of a flat bed without using bedrails?: Total Help needed moving to and from a bed to a chair (including a wheelchair)?: Total Help needed standing up from a chair using your arms (e.g.,  wheelchair or bedside chair)?: Total Help needed to walk in hospital room?: Total Help needed climbing 3-5 steps with a railing? : Total 6 Click Score: 6    End of Session   Activity Tolerance: Patient tolerated treatment well Patient left: in bed;with call bell/phone within reach;with bed alarm set;Other (comment) (mittens reapplied) Nurse Communication: Mobility status PT Visit Diagnosis: Unsteadiness on feet (R26.81);Difficulty in walking, not elsewhere classified (R26.2);Muscle weakness (generalized) (M62.81)     Time: 1037-1100 PT Time Calculation (min) (ACUTE ONLY): 23 min  Charges:  $Therapeutic Activity: 8-22 mins                     Sugar Vanzandt A. Gilford Rile PT, DPT [facility] - Acute Rehabilitation Services    Whit Bruni A Ermine Stebbins 05/05/2022, 11:21 AM

## 2022-05-05 NOTE — Progress Notes (Signed)
Progress Note   Patient: Laurie Lin JJK:[id number] DOB: 1955-05-14 DOA: 04/17/2022     17 DOS: the patient was seen and examined on 05/05/2022   Brief hospital course: 10yof w/ diabetes mellitus type 1, stage IIIa chronic kidney disease, hypertension and ongoing alcohol use who was brought in for worsening confusion for the last month.  Workup revealed urinary tract infection, acute kidney injury, hypomagnesemia and hyperkalemia.  MRI unremarkable for CVA. On evening of 12/30, patient started becoming slightly more agitated requiring Ativan.  As 12/31 progressed, patient became even more agitated despite Ativan.  Discussed with critical care.  Patient transferred to critical care service to the ICU and started on Precedex drip.  With worsening agitation, patient intubated on 1/1.  By 1/3, able to be weaned off of Precedex and able to be extubated on 1/4.  Patient also with Klebsiella UTI, treated with course of Rocephin.  [facility] assumed care 1/5 and patient started on IV antibiotics for aspiration pneumonia.  Mentation waxing and waning and unable to clear swallow evaluation.  Patient currently on tube feeds.further discussion was had w/ family and proceeding with PEG tube placement.  1/8: Mild tachycardia and low 100s, labs with improvement in leukocytosis.  BMP with slight worsening of creatinine to 1.53.  Increasing free water intake through NG tube.  CBG remained elevated, adding NovoLog 7 unit every 4 hourly if continued to receive tube feeding, discontinued later as patient developed some hypoglycemia. Patient had some nausea and vomiting so tube feed is currently being held. Going for PEG tube placement tomorrow.  1/9: Patient had her PEG tube placed today by IR.  Remained quite encephalopathic and not following any commands.  She did received high-dose thiamine for concern of Wernicke's encephalopathy during current hospitalization.  Imaging was negative.  Did received multiple  courses of antibiotics for concern of bronchitis followed by UTI followed by aspiration pneumonia. Consulted neurology for persistent encephalopathy. PEG tube will be ready for medications around 6 PM and to start feed tomorrow morning.  1/10: Patient more alert and able to communicate to some extent.  Not following commands very consistently.  Still unable to void after removing Foley catheter yesterday afternoon.  Requiring in and out catheter x 1 overnight and the second time this morning.  We will try to encourage voiding and if still unable to void then might need long term Foley catheter. Tube feeding was resumed through PEG tube today. Concern of Wernicke's/Korsakoff syndrome, might have some irreversible damage due to excessive alcohol use.  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  Neurology is recommending checking B12 and to keep it above 400.  They also ordered routine EEG although there was no underlying seizure-like activity noted. Ordered a.m. labs with anemia panel.  1/11: Vital stable labs with some decrease in leukocytosis to 12.9 and hemoglobin decreased to 9.2, all cell lines decreased, most likely some dilutional effect.  Slight worsening of creatinine to 1.76, ordered renal ultrasound and it was unremarkable.  Anemia panel with normal ferritin, folate and iron studies consistent with anemia of chronic disease with mildly low iron.  B12 and vitamin D Hypomagnesemia with magnesium of 1.6 which is being repleted Will complete the course of antibiotic today for aspiration pneumonia. Foley catheter was replaced overnight due to inability to void and requiring multiple in and out catheterization. EEG with diffuse encephalopathy and no  concern of seizures.  1/12:Vital stable, B12 at 251, goal above 400, ordered 3 doses of IM B12 1000 mg, followed by daily  p.o. vitamin D levels pending.  Going for barium swallow studies today.  No change in mental status.  Most likely has progressed to reversible damage with Wernicke's/Korsakoff syndrome.  1/13: Hemodynamically stable.  Barium swallow studies with very decreased response and unable to follow commands to swallow.  Keeping bolus in mouth, able to hold cup but unable to drink from it.  Swallow team is recommending just ice chips at this time to keep stimulating oral mucosa.  Patient will also need speech and swallow therapy given go to rehab. CBG elevated-added NovoLog 4 units every 6 hourly while she is on tube feed. Had 1 episode of loose bowel movement-if developed diarrhea with multiple bowel movements, might need a change in tube feed.  1/14: CBG improving but still elevated, increasing NovoLog to 5 units every 6 hourly.  No other acute concerns.  Still no bed offers.  TOC expanded the search.  1/15: CBG elevated this morning, borderline low around midnight.  Increasing NovoLog to every 4 hourly as recommended by diabetes coordinator.  Not had a bed offer-pending insurance authorization.  Patient will be going with Foley catheter and outpatient urology evaluation is recommended.  Assessment and Plan: * Encephalopathy acute MRI ruled out CVA, but does note significant wear and tear disproportionate for patient's age.  Discussed with the patient's husband and he understands.  If patient not improved, husband is interested in a long-term care facility.  Decompensated requiring Precedex drip and mechanical intubation.  Patient able to be weaned off Precedex and extubated 1/4.  She is now more appropriate and still weak and still  confused, but answering some questions.  PT and OT recommending CIR, but patient not felt to be good candidate for CIR given waxing waning mental status, so would need skilled nursing.  At this time, it is unclear how much of her mentation issues are going to be long-term.   Concern of Warnicke's-Korsakoff syndrome and irreversible damage due to excessive alcohol use. EEG was ordered by neurology although there was no seizure-like activity noted, and it shows diffuse encephalopathy with no seizure-like activity. Persistent encephalopathy and unable to participate with care.    Aspiration pneumonia ([place]) Patient noted to have some low-grade temperature, increased white blood cell count and increased procalcitonin.  Chest x-ray noted new infiltrate, suspicious for aspiration pneumonia.  Started antibiotics and labs are better today.  Speech therapy saw patient and due to mentation, not felt to be cleared for diet.   PEG tube was placed yesterday and tube feeding resumed today. -Completed a course of antibiotic  Acute renal failure superimposed on stage 3a chronic kidney disease ([place]) Most likely prerenal, creatinine fluctuating. -Monitor renal function -Avoid nephrotoxins  Alcohol abuse According to husband, patient was drinking 7-8 beers per day and has not had any alcohol in at least several days before coming in.  Status post full withdrawals.  Now on vitamin supplementation  Type 1 diabetes mellitus with hyperglycemia ([place]) - The patient will be placed on supplement coverage with NovoLog and we will continue her basal coverage.  A1c notes moderate control at 7.2, although I suspect some of her overall " controlled blood sugars" are due to poor calorie intake secondary to alcoholism.  CBGs initially in the 300-400s.  Not in DKA.  Appreciate diabetes coordinator help.  Now with sliding scale and subcu insulin.  Bronchitis-resolved  as of 04/25/2022 Completed course of antibiotics to Rocephin and Zithromax  UTI due to Klebsiella species-resolved as of 04/25/2022 Urine cultures growing out pansensitive Klebsiella.  Completed course of Rocephin  Essential hypertension BNP elevated at 356 and blood pressures were starting to trend upward.  Echocardiogram noted normal  diastolic function and ejection fraction of 50 to 55%, on the low end of normal.  Patient has received 1 dose of IV Lasix.  Will add scheduled IV Lopressor given heart rate staying in the 90s and blood pressures ranging from the 130s to 170s.  Once PEG tube in place, can change to p.o. beta-blocker  GERD without esophagitis - We will continue her PPI therapy.  Overweight (BMI 25.0-29.9) Meets criteria for BMI greater than 25  Acute respiratory failure with hypoxia (HCC)-resolved as of 04/25/2022 Secondary to agitation.  Required mechanical intubation.  Hypernatremia-resolved as of 04/25/2022 Secondary to lack of p.o. intake while she is confused.  Change IV fluids to half-normal saline.        Subjective: Patient was seen and examined today.  No new concern.  Remained pleasantly confused.  Physical Exam: Vitals:   05/04/22 2140 05/05/22 0055 05/05/22 0541 05/05/22 1323  BP: 122/60 128/66  135/69  Pulse: 79 88  90  Resp:   18 16  Temp: 99 F (37.2 C)   98.3 F (36.8 C)  TempSrc: Oral   Oral  SpO2:    99%  Weight:      Height:       General.  Pleasantly confused lady, in no acute distress. Pulmonary.  Lungs clear bilaterally, normal respiratory effort. CV.  Regular rate and rhythm, no JVD, rub or murmur. Abdomen.  Soft, nontender, nondistended, BS positive. CNS.  Alert and oriented .  No focal neurologic deficit.  Following some commands Extremities.  No edema, no cyanosis, pulses intact and symmetrical. Psychiatry.  Judgment and insight appears impaired..  Data Reviewed: Prior data reviewed  Family Communication:   Disposition: Status is: Inpatient Remains inpatient appropriate because: Severity of illness  Planned Discharge Destination: Skilled nursing facility  DVT prophylaxis.  Lovenox Time spent: 40 minutes  This record has been created using Systems analyst. Errors have been sought and corrected,but may not always be located. Such creation  errors do not reflect on the standard of care.   Author: Lorella Nimrod, MD 05/05/2022 2:21 PM  For on call review [email or web address].

## 2022-05-05 NOTE — TOC Progression Note (Addendum)
Transition of Care [facility]) - Progression Note    Patient Details  Name: Laurie Lin MRN: [id number] Date of Birth: 1955-05-22  Transition of Care [facility]) CM/SW [place], RN Phone Number: 05/05/2022, 9:23 AM  Clinical Narrative:   Had a conference call with the patient's daughters and husband on the phone and reviewed the bed offers and locations of facilities. They chose [facility] in [place], I explained I would start the Ins process and let them know once we get approval, I reached out to [facility] and accepted the bed offer and  Will start Ins process when Therapy notes are updated    Expected Discharge Plan: [place] Barriers to Discharge: Continued Medical Work up  Expected Discharge Plan and [place] arrangements for the past 2 months: Single Family Home                                       Social Determinants of Health (SDOH) Interventions SDOH Screenings   Food Insecurity: No Food Insecurity (04/19/2022)  Housing: Low Risk  (04/19/2022)  Transportation Needs: No Transportation Needs (04/19/2022)  Utilities: Not At Risk (04/19/2022)  Tobacco Use: Low Risk  (04/29/2022)    Readmission Risk Interventions     No data to display

## 2022-05-05 NOTE — Inpatient Diabetes Management (Signed)
Inpatient Diabetes Program Recommendations  AACE/ADA: New Consensus Statement on Inpatient Glycemic Control (2015)  Target Ranges:  Prepandial:   less than 140 mg/dL      [facility] postprandial:   less than 180 mg/dL (1-2 hours)      Critically ill patients:  140 - 180 mg/dL    Latest Reference Range & Units 05/04/22 08:25 05/04/22 11:31 05/04/22 16:37 05/04/22 20:21 05/05/22 00:40 05/05/22 04:07 05/05/22 08:43 05/05/22 11:53  Glucose-Capillary 70 - 99 mg/dL 200 (H) 192 (H) 307 (H) 226 (H) 91 259 (H) 202 (H) 209 (H)    Diabetes history: DM1 (does not make any insulin; requires basal, correction, and carbohydrate coverage insulin)    Home DM Meds: Basaglar 12 units QHS     Novolog 0-9 units TID with meals    Current Orders: Semglee 12 units Daily      Novolog Resistant Correction Scale/ SSI (0-20 units) Q4 hours      Novolog 5 units Q6 hours      MD- Note Osmolite tube feed rate 60cc/hr  Please change Novolog Tube Feed Coverage: -    To Q4 hours (titrate slowly as needed) HOLD if tube Feeds HELD for any reason    --Will follow patient during hospitalization--  Tama Headings RN, MSN, BC-ADM Inpatient Diabetes Coordinator Team Pager [phone] (8a-5p)

## 2022-05-05 NOTE — NC FL2 (Signed)
[place] LEVEL OF CARE FORM     IDENTIFICATION  Patient Name: Laurie Lin Birthdate: 03/07/1956 Sex: female Admission Date (Current Location): 04/17/2022  [facility] and [place] Number:  Engineering geologist and Address:  [facility], [address], [place], [place] [address]      Provider Number: [id number]  Attending Physician Name and Address:  Lorella Nimrod, MD  Relative Name and Phone Number:  Adiba Fargnoli    Current Level of Care: Hospital Recommended Level of Care: [place] Prior Approval Number:    Date Approved/Denied:   PASRR Number: [id number]  Discharge Plan: SNF    Current Diagnoses: Patient Active Problem List   Diagnosis Date Noted   Aspiration pneumonia ([place]) 04/25/2022   Overweight (BMI 25.0-29.9) 04/19/2022   Encephalopathy acute 04/18/2022   Alcohol abuse 04/18/2022   GERD without esophagitis 04/18/2022   Acute renal failure superimposed on stage 3a chronic kidney disease ([place]) 04/18/2022   Wernicke encephalopathy syndrome 04/18/2022   Trochanteric bursitis of left hip 01/17/2020   Anemia in chronic kidney disease 11/03/2019   Chronic kidney disease, stage 3 unspecified ([place]) 11/03/2019   Burn of second degree of left foot, initial encounter 12/28/2018   Neurogenic pain 09/30/2017   Anemia 09/30/2017   Hyponatremia 09/30/2017   Hypocalcemia 09/30/2017   Hypoalbuminemia 09/30/2017   Diabetes mellitus with stage 4 chronic kidney disease GFR 15-29 ([place]) 09/30/2017   Diabetic peripheral neuropathy ([place]) 09/30/2017   Chronic pain of hip (B) 09/30/2017   Chronic sacroiliac joint pain (B) 09/30/2017   Lumbar facet joint syndrome 09/30/2017   H/O diabetic foot ulcer 09/09/2017   Chronic low back pain (Primary Area of Pain) (Bilateral) (R>L) w/ sciatica (Bilateral) 09/09/2017   Chronic lower extremity pain (Secondary Area of Pain) (Bilateral) (R>L) 09/09/2017   Chronic pain syndrome  09/09/2017   Long term current use of opiate analgesic 09/09/2017   Pharmacologic therapy 09/09/2017   Disorder of skeletal system 09/09/2017   Problems influencing health status 09/09/2017   Other cirrhosis of liver ([place]) 06/03/2017   Chronic lumbar radiculopathy 06/03/2017   Chronic neck pain 06/03/2017   Type 1 diabetes mellitus with hyperglycemia ([place]) 04/30/2017   Atrial flutter ([place]) 04/30/2017   Neuropathy 04/30/2017   Hiatal hernia 04/30/2017   Cataracts, bilateral 04/30/2017   Albuminuria 04/30/2017   DKA (diabetic ketoacidoses) 03/21/2017   Reactive depression 01/03/2015   Essential hypertension 09/28/2014   Hepatic steatosis 08/17/2013   Elevated liver enzymes 08/17/2013   Abnormal levels of other serum enzymes 08/17/2013    Orientation RESPIRATION BLADDER Height & Weight     Self  Normal Incontinent, External catheter Weight: 87.5 kg Height:  5\' 10"  (177.8 cm)  BEHAVIORAL SYMPTOMS/MOOD NEUROLOGICAL BOWEL NUTRITION STATUS  Other (Comment) (agitated)   Continent Diet (see dc summary)  AMBULATORY STATUS COMMUNICATION OF NEEDS Skin   Extensive Assist Verbally Normal                       Personal Care Assistance Level of Assistance  Bathing, Feeding, Dressing Bathing Assistance: Limited assistance Feeding assistance: Limited assistance Dressing Assistance: Limited assistance     Functional Limitations Info   (WNL)          SPECIAL CARE FACTORS FREQUENCY  PT (By licensed PT), OT (By licensed OT)     PT Frequency: 5 times per week OT Frequency: 5 times per week  Contractures Contractures Info: Not present    Additional Factors Info  Code Status, Allergies Code Status Info: full code Allergies Info: Ibuprofen, Nsaids, Brimonidine, Latex           Current Medications (05/05/2022):  This is the current hospital active medication list Current Facility-Administered Medications  Medication Dose Route Frequency Provider Last Rate Last  Admin   0.9 %  sodium chloride infusion   Intravenous PRN Annita Brod, MD 10 mL/hr at 04/30/22 2019 Infusion Verify at 04/30/22 2019   acetaminophen (TYLENOL) tablet 650 mg  650 mg Per Tube Q6H PRN Annita Brod, MD   650 mg at 05/04/22 2142   Or   acetaminophen (TYLENOL) suppository 650 mg  650 mg Rectal Q6H PRN Annita Brod, MD       Chlorhexidine Gluconate Cloth 2 % PADS 6 each  6 each Topical Daily Annita Brod, MD   6 each at 05/05/22 1318   cyanocobalamin (VITAMIN B12) tablet 1,000 mcg  1,000 mcg Per Tube Daily Lorella Nimrod, MD   1,000 mcg at 05/05/22 0930   enoxaparin (LOVENOX) injection 40 mg  40 mg Subcutaneous Q24H Annita Brod, MD   40 mg at 05/05/22 3329   feeding supplement (OSMOLITE 1.5 CAL) liquid 1,000 mL  1,000 mL Per Tube Continuous Lorella Nimrod, MD 60 mL/hr at 05/05/22 0700 Infusion Verify at 05/05/22 0700   feeding supplement (PROSource TF20) liquid 60 mL  60 mL Per Tube Daily Lorella Nimrod, MD   60 mL at 51/88/41 6606   folic acid injection 1 mg  1 mg Intravenous Daily Annita Brod, MD   1 mg at 05/05/22 1317   free water 140 mL  140 mL Per Tube Q4H Amin, Soundra Pilon, MD   140 mL at 05/05/22 1200   hydrOXYzine (ATARAX) tablet 10 mg  10 mg Per Tube TID PRN Annita Brod, MD   10 mg at 05/05/22 0930   insulin aspart (novoLOG) injection 0-20 Units  0-20 Units Subcutaneous Q4H Annita Brod, MD   7 Units at 05/05/22 1317   insulin aspart (novoLOG) injection 5 Units  5 Units Subcutaneous Q4H Lorella Nimrod, MD       insulin glargine-yfgn (SEMGLEE) injection 12 Units  12 Units Subcutaneous Daily Annita Brod, MD   12 Units at 05/05/22 0929   labetalol (NORMODYNE) injection 10 mg  10 mg Intravenous Q2H PRN Annita Brod, MD   10 mg at 04/26/22 1114   magnesium hydroxide (MILK OF MAGNESIA) suspension 30 mL  30 mL Per Tube Daily PRN Annita Brod, MD   30 mL at 05/01/22 1425   metoCLOPramide (REGLAN) injection 5 mg  5 mg  Intravenous Q8H Gevena Barre K, MD   5 mg at 05/05/22 1318   metoprolol tartrate (LOPRESSOR) injection 5 mg  5 mg Intravenous Q6H Gevena Barre K, MD   5 mg at 05/05/22 1317   ondansetron (ZOFRAN) tablet 4 mg  4 mg Per Tube Q6H PRN Annita Brod, MD       Or   ondansetron [facility]) injection 4 mg  4 mg Intravenous Q6H PRN Annita Brod, MD   4 mg at 04/28/22 1114   Oral care mouth rinse  15 mL Mouth Rinse 4 times per day Annita Brod, MD   15 mL at 05/05/22 1200   Oral care mouth rinse  15 mL Mouth Rinse PRN Annita Brod, MD       thiamine (  VITAMIN B1) injection 100 mg  100 mg Intravenous Daily Annita Brod, MD   100 mg at 05/05/22 1317     Discharge Medications: Please see discharge summary for a list of discharge medications.  Relevant Imaging Results:  Relevant Lab Results:   Additional Information SS# [id number]  Conception Oms, RN

## 2022-05-05 NOTE — TOC Progression Note (Signed)
Transition of Care [facility]) - Progression Note    Patient Details  Name: Laurie Lin MRN: [id number] Date of Birth: [birth year]  Transition of Care [facility]) CM/SW [place], RN Phone Number: 05/05/2022, 9:15 AM  Clinical Narrative:    Spoke with Husband [name] at [phone], Reviewed the bed offers with him, I explained we still need to get Ins approval as well, He is going to call his daughter and talk about it with her and will call me back with choice   Expected Discharge Plan: Skilled Nursing Facility Barriers to Discharge: Continued Medical Work up  Expected Discharge Plan and Services       Living arrangements for the past 2 months: Single Family Home                                       Social Determinants of Health (SDOH) Interventions SDOH Screenings   Food Insecurity: No Food Insecurity (04/19/2022)  Housing: Low Risk  (04/19/2022)  Transportation Needs: No Transportation Needs (04/19/2022)  Utilities: Not At Risk (04/19/2022)  Tobacco Use: Low Risk  (04/29/2022)    Readmission Risk Interventions     No data to display

## 2022-05-05 NOTE — Assessment & Plan Note (Signed)
-  The patient will be placed on supplement coverage with NovoLog and we will continue her basal coverage.  A1c notes moderate control at 7.2, although I suspect some of her overall " controlled blood sugars" are due to poor calorie intake secondary to alcoholism.  CBGs initially in the 300-400s.  Not in DKA.  Appreciate diabetes coordinator help.  Now with sliding scale and subcu insulin.

## 2022-05-05 NOTE — TOC Progression Note (Signed)
Transition of Care [facility]) - Progression Note    Patient Details  Name: Laurie Lin MRN: [id number] Date of Birth: 03/23/1956  Transition [facility] Care [facility]) CM/SW [place], RN Phone Number: 05/05/2022, 10:57 AM  Clinical Narrative:     [facility] bed I called her husband [name] and verified the bed acceptance [facility], I explained we will get ins approval  Expected Discharge Plan: [place] Barriers to Discharge: Continued Medical Work up  Expected Discharge Plan and Services       Living arrangements for the past 2 months: Single Family Home                                       Social Determinants of Health (SDOH) Interventions SDOH Screenings   Food Insecurity: No Food Insecurity (04/19/2022)  Housing: Low Risk  (04/19/2022)  Transportation Needs: No Transportation Needs (04/19/2022)  Utilities: Not At Risk (04/19/2022)  Tobacco Use: Low Risk  (04/29/2022)    Readmission Risk Interventions     No data to display

## 2022-05-05 NOTE — TOC Progression Note (Signed)
Transition of Care [facility]) - Progression Note    Patient Details  Name: SOPHEE MCKIMMY MRN: [id number] Date of Birth: April 23, 1955  [facility]) CM/SW [place], RN Phone Number: 05/05/2022, 3:09 PM  Clinical Narrative:    Going to room [address]B at [facility], Ins approved, IO called and notified her Son [name], he will notify the re4st of the family EMS to transport   Expected Discharge Plan: [place] Barriers to Discharge: Continued Medical Work up  Expected Discharge Plan and [place] arrangements for the past 2 months: Single Family Home                                       Social Determinants of Health (SDOH) Interventions SDOH Screenings   Food Insecurity: No Food Insecurity (04/19/2022)  Housing: Low Risk  (04/19/2022)  Transportation Needs: No Transportation Needs (04/19/2022)  Utilities: Not At Risk (04/19/2022)  Tobacco Use: Low Risk  (04/29/2022)    Readmission Risk Interventions     No data to display

## 2022-05-05 NOTE — Discharge Summary (Signed)
Physician Discharge Summary   Patient: Laurie Lin MRN: [id number] DOB: 24-Mar-1956  Admit date:     04/17/2022  Discharge date: 05/06/22  Discharge Physician: Lorella Nimrod   PCP: Pcp, No   Recommendations at discharge:  Please obtain CBC, BMP, magnesium and phosphorus level within a week. Patient will need PT/OT and swallow therapies at facility. Patient is being discharged with Foley catheter as she is unable to void, will need outpatient urology evaluation. Follow-up with neurology Follow-up with primary care provider  Discharge Diagnoses: Principal Problem:   Encephalopathy acute Active Problems:   Aspiration pneumonia ([place])   Acute renal failure superimposed on stage 3a chronic kidney disease (HCC)   Type 1 diabetes mellitus with hyperglycemia (HCC)   Alcohol abuse   Essential hypertension   GERD without esophagitis   Overweight (BMI 25.0-29.9)  Resolved Problems:   Bronchitis   UTI due to Klebsiella species   Hypernatremia   Acute respiratory failure with hypoxia [facility])  Hospital Course: 67yof w/ diabetes mellitus type 1, stage IIIa chronic kidney disease, hypertension and ongoing alcohol use who was brought in for worsening confusion for the last month.  Workup revealed urinary tract infection, acute kidney injury, hypomagnesemia and hyperkalemia.  MRI unremarkable for CVA. On evening of 12/30, patient started becoming slightly more agitated requiring Ativan.  As 12/31 progressed, patient became even more agitated despite Ativan.  Discussed with critical care.  Patient transferred to critical care service to the ICU and started on Precedex drip.  With worsening agitation, patient intubated on 1/1.  By 1/3, able to be weaned off of Precedex and able to be extubated on 1/4.  Patient also with Klebsiella UTI, treated with course of Rocephin.  [facility] assumed care 1/5 and patient started on IV antibiotics for aspiration pneumonia.  Mentation waxing and waning and  unable to clear swallow evaluation.  Patient currently on tube feeds.further discussion was had w/ family and proceeding with PEG tube placement.  1/8: Mild tachycardia and low 100s, labs with improvement in leukocytosis.  BMP with slight worsening of creatinine to 1.53.  Increasing free water intake through NG tube.  CBG remained elevated, adding NovoLog 7 unit every 4 hourly if continued to receive tube feeding, discontinued later as patient developed some hypoglycemia. Patient had some nausea and vomiting so tube feed is currently being held. Going for PEG tube placement tomorrow.  1/9: Patient had her PEG tube placed today by IR.  Remained quite encephalopathic and not following any commands.  She did received high-dose thiamine for concern of Wernicke's encephalopathy during current hospitalization.  Imaging was negative.  Did received multiple courses of antibiotics for concern of bronchitis followed by UTI followed by aspiration pneumonia. Consulted neurology for persistent encephalopathy. PEG tube will be ready for medications around 6 PM and to start feed tomorrow morning.  1/10: Patient more alert and able to communicate to some extent.  Not following commands very consistently.  Still unable to void after removing Foley catheter yesterday afternoon.  Requiring in and out catheter x 1 overnight and the second time this morning.  We will try to encourage voiding and if still unable to void then might need long term Foley catheter. Tube feeding was resumed through PEG tube today. Concern of Wernicke's/Korsakoff syndrome, might have some irreversible damage due to excessive alcohol use.  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  Neurology is recommending checking B12 and to keep it above 400.  They also ordered routine EEG although there was no underlying seizure-like  activity noted. Ordered a.m. labs with anemia panel.  1/11: Vital stable labs with some decrease in leukocytosis to 12.9 and hemoglobin decreased to 9.2, all cell lines decreased, most likely some dilutional effect.  Slight worsening of creatinine to 1.76, ordered renal ultrasound and it was unremarkable.  Anemia panel with normal ferritin, folate and iron studies consistent with anemia of chronic disease with mildly low iron.  B12 and vitamin D Hypomagnesemia with magnesium of 1.6 which is being repleted Will complete the course of antibiotic today for aspiration pneumonia. Foley catheter was replaced overnight due to inability to void and requiring multiple in and out catheterization. EEG with diffuse encephalopathy and no concern of seizures.  1/12:Vital stable, B12 at 251, goal above 400, ordered 3 doses of IM B12 1000 mg, followed by daily p.o. vitamin D levels pending.  Going for barium swallow studies today.  No change in mental status.  Most likely has progressed to reversible damage with Wernicke's/Korsakoff syndrome.  1/13: Hemodynamically stable.  Barium swallow studies with very decreased response and unable to follow commands to swallow.  Keeping bolus in mouth, able to hold cup but unable to drink from it.  Swallow team is recommending just ice chips at this time to keep stimulating oral mucosa.  Patient will also need speech and swallow therapy given go to rehab. CBG elevated-added NovoLog 4 units every 6 hourly while she is on tube feed. Had 1 episode of loose bowel movement-if developed diarrhea with multiple bowel movements, might need a change in tube feed.  1/14: CBG improving but still elevated, increasing NovoLog to 5 units every 6 hourly.  No other acute concerns.  Still no bed offers.  TOC expanded the search.  1/15: CBG elevated this morning, borderline low around midnight.  Increasing NovoLog to every 4 hourly as recommended by diabetes coordinator.  Not had a bed  offer-pending insurance authorization.  Patient will be going with Foley catheter and outpatient urology evaluation is recommended.  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 Patient is being discharged with PEG tube and tube feeding.  She will need continuation of swallow and speech therapy at facility to see if we can facilitate p.o. intake.  She is currently on ice chips to stimulate swallowing.  She is being discharged on 5 units of NovoLog with each tube feed while taking tube feed, please stop NovoLog if not taking tube feed.  Patient will need regular CBG monitoring to adjust insulin dose.  She will also need dietitian help at facility to monitor tube feed and make changes as appropriate.  If she had more diarrhea, use imodium and might need change in her formula.  She will continue on current medications which include T41, B1 and folic acid supplement along with multivitamin.  Patient need rehab to improve mobility and coordination and for which she is being discharged to SNF for further management.  Patient will need to follow-up with neurology, primary care provider and urology for further management.  Chances of much significant meaningful recovery are slim at this time.  Goal is to make her little independent with her rehab so she can return home with help.   Assessment and Plan: * Encephalopathy acute MRI ruled out CVA, but does note significant wear and tear disproportionate for patient's age.  Discussed with the patient's husband and  he understands.  If patient not improved, husband is interested in a long-term care facility.  Decompensated requiring Precedex drip and mechanical intubation.  Patient able to be weaned off Precedex and extubated 1/4.  She is now more appropriate and still weak and still  confused, but answering some questions.  PT and OT recommending CIR, but patient not felt to be good candidate for CIR given  waxing waning mental status, so would need skilled nursing.  At this time, it is unclear how much of her mentation issues are going to be long-term.  Concern of Warnicke's-Korsakoff syndrome and irreversible damage due to excessive alcohol use. EEG was ordered by neurology although there was no seizure-like activity noted, and it shows diffuse encephalopathy with no seizure-like activity. Persistent encephalopathy and unable to participate with care.    Aspiration pneumonia ([place]) Patient noted to have some low-grade temperature, increased white blood cell count and increased procalcitonin.  Chest x-ray noted new infiltrate, suspicious for aspiration pneumonia.  Started antibiotics and labs are better today.  Speech therapy saw patient and due to mentation, not felt to be cleared for diet.   PEG tube was placed yesterday and tube feeding resumed today. -Completed a course of antibiotic  Acute renal failure superimposed on stage 3a chronic kidney disease ([place]) Most likely prerenal, creatinine fluctuating. -Monitor renal function -Avoid nephrotoxins  Alcohol abuse According to husband, patient was drinking 7-8 beers per day and has not had any alcohol in at least several days before coming in.  Status post full withdrawals.  Now on vitamin supplementation  Type 1 diabetes mellitus with hyperglycemia ([place]) - The patient will be placed on supplement coverage with NovoLog and we will continue her basal coverage.  A1c notes moderate control at 7.2, although I suspect some of her overall " controlled blood sugars" are due to poor calorie intake secondary to alcoholism.  CBGs initially in the 300-400s.  Not in DKA.  Appreciate diabetes coordinator help.  Now with sliding scale and subcu insulin.  Bronchitis-resolved as of 04/25/2022 Completed course of antibiotics to Rocephin and Zithromax  UTI due to Klebsiella species-resolved as of 04/25/2022 Urine cultures growing out pansensitive Klebsiella.   Completed course of Rocephin  Essential hypertension BNP elevated at 356 and blood pressures were starting to trend upward.  Echocardiogram noted normal diastolic function and ejection fraction of 50 to 55%, on the low end of normal.  Patient has received 1 dose of IV Lasix.  Will add scheduled IV Lopressor given heart rate staying in the 90s and blood pressures ranging from the 130s to 170s.  Once PEG tube in place, can change to p.o. beta-blocker  GERD without esophagitis - We will continue her PPI therapy.  Overweight (BMI 25.0-29.9) Meets criteria for BMI greater than 25  Acute respiratory failure with hypoxia (HCC)-resolved as of 04/25/2022 Secondary to agitation.  Required mechanical intubation.  Hypernatremia-resolved as of 04/25/2022 Secondary to lack of p.o. intake while she is confused.  Change IV fluids to half-normal saline.         Consultants: PCCM, neurology, interventional radiology Procedures performed: PEG tube placement Disposition: Skilled nursing facility Diet recommendation: Tube Feed Discharge Diet Orders (From admission, onward)     Start     Ordered   05/05/22 0000  Diet - low sodium heart healthy        05/05/22 1516           NPO except ice chips for oral stimulation, she is on tube feed  DISCHARGE MEDICATION: Allergies as of 05/06/2022       Reactions   Ibuprofen Nausea Only   Nsaids    Ulcerative stomach and small intestines   Brimonidine Itching   Red, itchy, sensitivity to light Red, itchy, sensitivity to light   Latex Itching   Other reaction(s): Other (see comments)        Medication List     STOP taking these medications    dorzolamide 2 % ophthalmic solution Commonly known as: TRUSOPT   estradiol 0.1 MG/GM vaginal cream Commonly known as: ESTRACE   furosemide 20 MG tablet Commonly known as: LASIX   HYDROcodone-acetaminophen 5-325 MG tablet Commonly known as: NORCO/VICODIN   sulfamethoxazole-trimethoprim 800-160 MG  tablet Commonly known as: BACTRIM DS       TAKE these medications    acetaminophen 500 MG tablet Commonly known as: TYLENOL Take 500 mg by mouth 2 (two) times daily as needed.   Basaglar KwikPen 100 UNIT/ML Inject 12 Units into the skin at bedtime.   cyanocobalamin 1000 MCG tablet Place 1 tablet (1,000 mcg total) into feeding tube daily.   Dexcom G6 Sensor Misc by Does not apply route.   esomeprazole 40 MG capsule Commonly known as: NEXIUM TAKE 1 CAPSULE (40 MG TOTAL) BY MOUTH 2 (TWO) TIMES DAILY BEFORE A MEAL.   feeding supplement (OSMOLITE 1.5 CAL) Liqd Place 237 mLs into feeding tube 6 (six) times daily.   feeding supplement (PROSource TF20) liquid Place 60 mLs into feeding tube daily.   folic acid 1 MG tablet Commonly known as: FOLVITE Place 1 tablet (1 mg total) into feeding tube daily.   free water Soln Place 140 mLs into feeding tube 6 (six) times daily.   hydrOXYzine 10 MG tablet Commonly known as: ATARAX Place 1 tablet (10 mg total) into feeding tube 3 (three) times daily as needed for itching.   insulin aspart 100 UNIT/ML injection Commonly known as: novoLOG Inject 0-9 Units into the skin 3 (three) times daily with meals. BS 70-120 - 0 units       121-150 - 1 units       151-200 - 2 units        201-250 - 3 units        251-300 - 4 units        301-350 - 5 units        351-400 - 6 units        > 400 call md. What changed: Another medication with the same name was added. Make sure you understand how and when to take each.   insulin aspart 100 UNIT/ML injection Commonly known as: novoLOG Inject 5 Units into the skin 3 (three) times daily with meals. What changed: You were already taking a medication with the same name, and this prescription was added. Make sure you understand how and when to take each.   Insulin Pen Needle 32G X 4 MM Misc ok to sub any brand or size needle preferred by insurance/patient, use up to 5x/day, dx E10.65 What changed:  Another medication with the same name was removed. Continue taking this medication, and follow the directions you see here.   lisinopril 2.5 MG tablet Commonly known as: ZESTRIL Take 1 tablet (2.5 mg total) by mouth daily.   loperamide HCl 1 MG/7.5ML suspension Commonly known as: IMODIUM Place 15 mLs (2 mg total) into feeding tube as needed for diarrhea or loose stools.   magnesium hydroxide 400 MG/5ML suspension Commonly known as: MILK OF  MAGNESIA Place 30 mLs into feeding tube daily as needed for mild constipation.   metoCLOPramide 5 MG/5ML solution Commonly known as: REGLAN Place 5 mLs (5 mg total) into feeding tube 3 (three) times daily before meals.   multivitamin with minerals Tabs tablet Take 1 tablet by mouth daily.   OneTouch Delica Lancets 61Y Misc 1 Device by Does not apply route 4 (four) times daily.   Rhopressa 0.02 % Soln Generic drug: Netarsudil Dimesylate Place 1 drop into both eyes every evening.   thiamine 100 MG tablet Commonly known as: VITAMIN B1 Place 1 tablet (100 mg total) into feeding tube daily.   tiZANidine 4 MG capsule Commonly known as: Zanaflex Take 1 capsule (4 mg total) by mouth 3 (three) times daily as needed for muscle spasms.   torsemide 20 MG tablet Commonly known as: DEMADEX Take 1 tablet (20 mg total) by mouth daily as needed (Lower extremity edema). What changed:  when to take this reasons to take this   Travoprost (BAK Free) 0.004 % Soln ophthalmic solution Commonly known as: TRAVATAN Place 1 drop into both eyes at bedtime.               Discharge Care Instructions  (From admission, onward)           Start     Ordered   05/05/22 0000  No dressing needed        05/05/22 1516            Contact information for after-discharge care     Destination     [facility] Preferred SNF .   Service: Skilled Nursing Contact information: [address] [place]  [place] [phone]                    Discharge Exam: Danley Danker Weights   05/02/22 0500 05/04/22 0500 05/06/22 0547  Weight: 86.9 kg 87.5 kg 88.6 kg   General.  Pleasantly confused lady, in no acute distress. Pulmonary.  Lungs clear bilaterally, normal respiratory effort. CV.  Regular rate and rhythm, no JVD, rub or murmur. Abdomen.  Soft, nontender, nondistended, BS positive. CNS.  Alert and oriented .  No focal neurologic deficit. Extremities.  No edema, no cyanosis, pulses intact and symmetrical. Psychiatry.  Judgment and insight appears impaired  Condition at discharge: stable  The results of significant diagnostics from this hospitalization (including imaging, microbiology, ancillary and laboratory) are listed below for reference.   Imaging Studies: DG Swallowing Func-Speech Pathology  Result Date: 05/02/2022 Table formatting from the original result was not included. Objective Swallowing Evaluation: Type of Study: MBS-Modified Barium Swallow Study  Patient Details Name: ALICEA WENTE MRN: [id number] Date of Birth: [birth year] Today's Date: 05/02/2022 Time: SLP Start Time (ACUTE ONLY): 33 -SLP Stop Time (ACUTE ONLY): 1250 SLP Time Calculation (min) (ACUTE ONLY): 25 min Past Medical History: Past Medical History: Diagnosis Date  Diabetes mellitus without complication (HCC)   GERD (gastroesophageal reflux disease)   Hypertension   Renal disorder   stage 3  Past Surgical History: Past Surgical History: Procedure Laterality Date  DILATION AND CURETTAGE OF UTERUS    Miscarriage  EYE SURGERY    bilateral caterac  FINGER SURGERY    IR GASTROSTOMY TUBE MOD SED  04/29/2022  KNEE ARTHROSCOPY Left 1989  [facility]  PILONIDAL CYST DRAINAGE   HPI: 67 year old female with past medical history of ETOH abuse, diabetes mellitus type 1, stage IIIa chronic kidney disease, hypertension and ongoing Alcohol  use who was brought in for worsening confusion for the last month.  Workup revealed urinary tract  infection, acute kidney injury, hypomagnesemia, hyperkalemia.and Wernicke's encephalopathy.  While on floor she continued to deteriorate and has become unresponsive with jerky movements. PCCM consultation for concern patient developing Delerium Tremens. Pt required oral intubation; intubated 01/01 thru 04/24/2022.   CXR on 04/25/22: New right perihilar airspace opacity suspicious for early  pneumonia.  2. Hazy accentuated density at the right lung base with obscuration  of the right lateral hemidiaphragm, potentially from atelectasis or  pneumonia.  3. Airway thickening is present, suggesting bronchitis or reactive  airways disease.  Subjective: pt continues to be severely encephalopathic, unable to follow directions, awake but inattentive to stimuli  Recommendations for follow up therapy are one component of a multi-disciplinary discharge planning process, led by the attending physician.  Recommendations may be updated based on patient status, additional functional criteria and insurance authorization. Assessment / Plan / Recommendation   05/02/2022   9:00 PM Clinical Impressions Clinical Impression Pt continues to present with profound encephalopathy, suspect Wernicke Kerskoff. Pt with bilateral mittens in place. During this study, pt presents with no change since this therapist treated pt last Friday. She continues with decreased response to PO stimuli at liips, when mitten was removed, pt was able to hold cup but demonstrated no attention to it and exhibit withdrawal when therapist provided hand over hand assistance to attempt to bring cup to her mouth. There were a few occasions that pt allowed liquid to enter her mouth. this was followed by oral holding and decreased attention to bolus. Pt was very distracted with looking around the x-ray machine and was largely non-responsive to maximal multimodal cues to swallow. Pt held boluses for upwards of 45 seconds. When swallow was initiated, it was swift with good  airway protection. At this time, pt is inappropraite to return to PO intake as her main source of nutrition and hydration. Recommend ice chips after oral care to faciliate use of swallow musculature. Given pt's continued encephalopathy, she is unable to participate in skilled therapy at this time. Please re-consult when pt can consistently sustain attention to and exhibit function of a spoon or cup SLP Visit Diagnosis Dysphagia, oropharyngeal phase (R13.12) Impact on safety and function Severe aspiration risk;Risk for inadequate nutrition/hydration     05/02/2022   9:00 PM Treatment Recommendations Treatment Recommendations Patient unable to participate in swallow therapy at this time     05/02/2022   9:00 PM Prognosis Prognosis for Safe Diet Advancement Guarded Barriers to Reach Goals Cognitive deficits;Severity of deficits   05/02/2022   9:00 PM Diet Recommendations SLP Diet Recommendations Ice chips PRN after oral care Medication Administration Via alternative means Compensations Minimize environmental distractions;Slow rate;Small sips/bites Postural Changes Seated upright at 90 degrees     05/02/2022   9:00 PM Other Recommendations Oral Care Recommendations Oral care QID Follow Up Recommendations Skilled nursing-short term rehab (<3 hours/day) Functional Status Assessment Patient has had a recent decline in their functional status and/or demonstrates limited ability to make significant improvements in function in a reasonable and predictable amount of time   04/25/2022   5:00 PM Frequency and Duration  Speech Therapy Frequency (ACUTE ONLY) min 2x/week Treatment Duration 2 weeks     05/02/2022   9:00 PM Oral Phase Oral Phase Impaired    05/02/2022   9:00 PM Pharyngeal Phase Pharyngeal Phase [facility]    05/02/2022   9:00 PM Cervical Esophageal Phase  Cervical Esophageal  Phase [facility] Happi Overton 05/02/2022, 9:35 PM                     EEG adult  Result Date: 05/01/2022 Lora Havens, MD     05/01/2022  1:12 PM Patient Name:  EOWYN TABONE MRN: [id number] Epilepsy Attending: Lora Havens Referring Physician/Provider: Amie Portland, MD Date: 05/01/2022 Duration: 25.58 mins Patient history: 67 year old female with altered mental status.  EEG to evaluate for seizure. Level of alertness: Awake, asleep AEDs during EEG study: None Technical aspects: This EEG study was done with scalp electrodes positioned according to the 10-20 International system of electrode placement. Electrical activity was reviewed with band pass filter of 1-70Hz , sensitivity of 7 uV/mm, display speed of 53mm/sec with a 60Hz  notched filter applied as appropriate. EEG data were recorded continuously and digitally stored.  Video monitoring was available and reviewed as appropriate. Description: The posterior dominant rhythm consists of 7.5 Hz activity of moderate voltage (25-35 uV) seen predominantly in posterior head regions, symmetric and reactive to eye opening and eye closing. Sleep was characterized by vertex waves, sleep spindles (12 to 14 Hz), maximal frontocentral region. EEG showed intermittent generalized 3 to 6 Hz theta-delta slowing. Hyperventilation and photic stimulation were not performed.   ABNORMALITY - Intermittent slow, generalized IMPRESSION: This study is suggestive of mild diffuse encephalopathy, nonspecific etiology. No seizures or epileptiform discharges were seen throughout the recording. Priyanka O Yadav   US RENAL  Result Date: 05/01/2022 CLINICAL DATA:  Acute kidney injury. EXAM: RENAL / URINARY TRACT ULTRASOUND COMPLETE COMPARISON:  CT scan 04/17/2022 FINDINGS: Right Kidney: Renal measurements: 10.1 x 5.0 x 4.9 cm = volume: 130.2 mL. Normal renal cortical thickness and echogenicity without focal lesions or hydronephrosis. Left Kidney: Renal measurements: 10.8 x 6.2 x 5.0 cm = volume: 175.69 mL. Normal renal cortical thickness and echogenicity without focal lesions or hydronephrosis. Small lower pole left renal calculus also demonstrated  on prior CT scan. Bladder: Decompressed by a Foley catheter. Other: None. IMPRESSION: 1. Unremarkable sonographic appearance of both kidneys. No renal lesions or hydronephrosis. 2. The bladder is decompressed by a Foley catheter. Electronically Signed   By: Marijo Sanes M.D.   On: 05/01/2022 09:28   IR GASTROSTOMY TUBE MOD SED  Result Date: 04/29/2022 INDICATION: Dysphagia. EXAM: PERCUTANEOUS GASTROSTOMY TUBE PLACEMENT COMPARISON:  KUB, 04/26/2022.  CT AP, 04/17/2022. MEDICATIONS: Ancef 2 gm IV; Antibiotics were administered within 1 hour of the procedure. Zofran 4 mg IV and glucagon 0.5 mg IV CONTRAST:  15 mL of Isovue 300 administered into the gastric lumen. ANESTHESIA/SEDATION: Moderate (conscious) sedation was employed during this procedure. A total of Versed 1 mg and Fentanyl 50 mcg was administered intravenously. Moderate Sedation Time: 12 minutes. The patient's level of consciousness and vital signs were monitored continuously by radiology nursing throughout the procedure under my direct supervision. FLUOROSCOPY TIME:  Fluoroscopic dose; 82.9 mGy COMPLICATIONS: None immediate. PROCEDURE: Informed written consent was obtained from the patient and/or patient's representative following explanation of the procedure, risks, benefits and alternatives. A time out was performed prior to the initiation of the procedure. Maximal barrier sterile technique utilized including caps, mask, sterile gowns, sterile gloves, large sterile drape, hand hygiene and Betadine prep. The LEFT upper quadrant was sterilely prepped and draped. A oral gastric catheter was inserted into the stomach under fluoroscopy. The existing nasogastric feeding tube was removed. The left costal margin and barium opacified transverse colon were identified and avoided. Air was injected into the stomach  for insufflation and visualization under fluoroscopy. Under sterile conditions and local anesthesia, 2 T tacks were utilized to pexy the anterior  aspect of the stomach against the ventral abdominal wall. Contrast injection confirmed appropriate positioning of each of the T tacks. An incision was made between the T tacks and a 17 gauge trocar needle was utilized to access the stomach. Needle position was confirmed within the stomach with aspiration of air and injection of a small amount of contrast. A stiff Glidewire was advanced into the gastric lumen and under intermittent fluoroscopic guidance, the access needle was exchanged for a Kumpe catheter. With the use of the Kumpe catheter, a stiff Glidewire was advanced into the horizontal segment of the duodenum. Under intermittent fluoroscopic guidance, the Kumpe catheter was exchanged for a telescoping peel-away sheath, ultimately allowing placement of a 18 Fr balloon retention gastrostomy tube. The retention balloon was insufflated with a mixture of dilute saline and contrast and pulled taut against the anterior wall of the stomach. The external disc was cinched. Contrast injection confirms positioning within the stomach. Several spot radiographic images were obtained in various obliquities for documentation. T he patient tolerated procedure well without immediate post procedural complication. FINDINGS: After successful fluoroscopic guided placement, the gastrostomy tube is appropriately positioned with internal retention balloon against the ventral aspect of the gastric lumen. IMPRESSION: Successful placement of a 18 Fr balloon-retention gastrostomy tube. The gastrostomy may be used for medication administration within 6 hours placement, and initiation of tube feeds in 24 hrs. PLAN: Patient will return to Vascular [facility]iology (VIR) for routine feeding tube evaluation and exchange in 6 months. Michaelle Birks, MD [facility] Electronically Signed   By: Michaelle Birks M.D.   On: 04/29/2022 12:05   DG Abd 1 View  Result Date:  04/26/2022 CLINICAL DATA:  Nasogastric tube placement. EXAM: ABDOMEN - 1 VIEW COMPARISON:  April 22, 2022 FINDINGS: A feeding tube is seen with its distal tip overlying the expected region of the gastric antrum. The bowel gas pattern is normal. No radio-opaque calculi or other significant radiographic abnormality are seen. IMPRESSION: Feeding tube positioning, as described above. Electronically Signed   By: Virgina Norfolk M.D.   On: 04/26/2022 22:43   DG Chest Port 1 View  Result Date: 04/25/2022 CLINICAL DATA:  Pneumonia EXAM: PORTABLE CHEST 1 VIEW COMPARISON:  04/23/2022 FINDINGS: The endotracheal tube is no longer present. Feeding tube coiled in the stomach. Airway thickening is present, suggesting bronchitis or reactive airways disease. New right perihilar airspace opacity suspicious for early pneumonia. Left lung appears clear. Hazy accentuated density at the right lung base with some obscuration of the right lateral hemidiaphragm, potentially from atelectasis or pneumonia. Heart size within normal limits for portable technique. IMPRESSION: 1. New right perihilar airspace opacity suspicious for early pneumonia. 2. Hazy accentuated density at the right lung base with obscuration of the right lateral hemidiaphragm, potentially from atelectasis or pneumonia. 3. Airway thickening is present, suggesting bronchitis or reactive airways disease. Electronically Signed   By: Van Clines M.D.   On: 04/25/2022 11:58   DG Chest Port 1 View  Result Date: 04/23/2022 CLINICAL DATA:  Respiratory failure, endotracheal tube positioning. EXAM: PORTABLE CHEST 1 VIEW COMPARISON:  04/21/2022 FINDINGS: The endotracheal tube tip is 3.7 cm above the carina, satisfactorily positioned. Feeding tube tip in the stomach. Mild biapical pleuroparenchymal scarring. Linear subsegmental atelectasis or scarring at the left lung base. Cardiac and mediastinal margins appear normal. IMPRESSION: 1. The endotracheal  tube tip is 3.7 cm  above the carina, satisfactorily positioned. 2. Feeding tube tip in the stomach. Electronically Signed   By: Van Clines M.D.   On: 04/23/2022 11:59   DG Abd 1 View  Result Date: 04/22/2022 CLINICAL DATA:  Dyspnea, Dobbhoff placement EXAM: ABDOMEN - 1 VIEW COMPARISON:  CT 04/17/2022 FINDINGS: Feeding tube tip overlies the stomach. No evidence of bowel obstruction. No acute osseous abnormality. IMPRESSION: Feeding tube tip overlies the stomach. Electronically Signed   By: Maurine Simmering M.D.   On: 04/22/2022 14:12   CT HEAD WO CONTRAST (5MM)  Result Date: 04/22/2022 CLINICAL DATA:  Altered mental status. EXAM: CT HEAD WITHOUT CONTRAST TECHNIQUE: Contiguous axial images were obtained from the base of the skull through the vertex without intravenous contrast. RADIATION DOSE REDUCTION: This exam was performed according to the departmental dose-optimization program which includes automated exposure control, adjustment of the mA and/or kV according to patient size and/or use of iterative reconstruction technique. COMPARISON:  CT Head 04/18/22 FINDINGS: Brain: There is sequela of moderate chronic microvascular ischemic change with advanced generalized volume loss. Size and shape of the ventricular system is unchanged compared to recent prior head CT. No evidence of hemorrhage. No CT evidence of a new infarct. No extra-axial fluid collection. Vascular: No hyperdense vessel or unexpected calcification. Skull: Normal. Negative for fracture or focal lesion. Sinuses/Orbits: Bilateral lens replacement. Mild mucosal thickening bilateral maxillary and ethmoid sinuses. Bilateral mastoid and middle ears are clear. Frothy secretions in the nasopharynx, nonspecific. Other: None. IMPRESSION: 1. No acute intracranial abnormality. 2. Sequela of moderate chronic microvascular ischemic change with advanced generalized volume loss. Electronically Signed   By: Marin Roberts M.D.   On: 04/22/2022 13:54   DG Chest Port 1  View  Result Date: 04/21/2022 CLINICAL DATA:  Endotracheal tube placement. EXAM: PORTABLE CHEST 1 VIEW COMPARISON:  04/18/2022 FINDINGS: An endotracheal tube is noted just above the carina - recommend 2-3 cm retraction. The cardiomediastinal silhouette is unremarkable. There is no evidence of focal airspace disease, pulmonary edema, suspicious pulmonary nodule/mass, pleural effusion, or pneumothorax. No acute bony abnormalities are identified. IMPRESSION: 1. Endotracheal tube just above the carina - recommend 2-3 cm retraction. 2. No active disease. Electronically Signed   By: Margarette Canada M.D.   On: 04/21/2022 17:37   ECHOCARDIOGRAM COMPLETE  Result Date: 04/21/2022    ECHOCARDIOGRAM REPORT   Patient Name:   RONNIKA COLLETT Date of Exam: 04/20/2022 Medical Rec #:  [id number]       Height:       70.0 in Accession #:    [id number]      Weight:       205.0 lb Date of Birth:  [birth year]       BSA:          2.109 m Patient Age:    64 years        BP:           159/79 mmHg Patient Gender: F               HR:           102 bpm. Exam Location:  [facility] Procedure: 2D Echo, Color Doppler and Cardiac Doppler Indications:     I50.31 congestive heart failure-Acute Diastolic  History:         Patient has no prior history of Echocardiogram examinations.                  Risk Factors:Hypertension and  Diabetes.  Sonographer:     Charmayne Sheer Referring Phys:  [id number] Gammon [facility] Diagnosing Phys: Isaias Cowman MD  Sonographer Comments: Suboptimal apical window. Image acquisition challenging due to respiratory motion and and inability to awaken patient. IMPRESSIONS  1. Left ventricular ejection fraction, by estimation, is 50 to 55%. The left ventricle has low normal function. The left ventricle has no regional wall motion abnormalities. Left ventricular diastolic parameters were normal.  2. Right ventricular systolic function is normal. The right ventricular size is normal.  3. The mitral valve is normal in structure. Trivial  mitral valve regurgitation. No evidence of mitral stenosis.  4. The aortic valve is normal in structure. Aortic valve regurgitation is not visualized. No aortic stenosis is present.  5. The inferior vena cava is normal in size with greater than 50% respiratory variability, suggesting right atrial pressure of 3 mmHg. FINDINGS  Left Ventricle: Left ventricular ejection fraction, by estimation, is 50 to 55%. The left ventricle has low normal function. The left ventricle has no regional wall motion abnormalities. The left ventricular internal cavity size was normal in size. There is no left ventricular hypertrophy. Left ventricular diastolic parameters were normal. Right Ventricle: The right ventricular size is normal. No increase in right ventricular wall thickness. Right ventricular systolic function is normal. Left Atrium: Left atrial size was normal in size. Right Atrium: Right atrial size was normal in size. Pericardium: There is no evidence of pericardial effusion. Mitral Valve: The mitral valve is normal in structure. Trivial mitral valve regurgitation. No evidence of mitral valve stenosis. Tricuspid Valve: The tricuspid valve is normal in structure. Tricuspid valve regurgitation is trivial. No evidence of tricuspid stenosis. Aortic Valve: The aortic valve is normal in structure. Aortic valve regurgitation is not visualized. No aortic stenosis is present. Aortic valve mean gradient measures 4.0 mmHg. Aortic valve peak gradient measures 7.2 mmHg. Aortic valve area, by VTI measures 1.64 cm. Pulmonic Valve: The pulmonic valve was normal in structure. Pulmonic valve regurgitation is not visualized. No evidence of pulmonic stenosis. Aorta: The aortic root is normal in size and structure. Venous: The inferior vena cava is normal in size with greater than 50% respiratory variability, suggesting right atrial pressure of 3 mmHg. IAS/Shunts: No atrial level shunt detected by color flow Doppler.  LEFT VENTRICLE PLAX 2D  LVIDd:         4.40 cm   Diastology LVIDs:         3.20 cm   LV e' medial:    7.94 cm/s LV PW:         0.90 cm   LV E/e' medial:  10.3 LV IVS:        0.80 cm   LV e' lateral:   8.27 cm/s LVOT diam:     1.80 cm   LV E/e' lateral: 9.9 LV SV:         39 LV SV Index:   18 LVOT Area:     2.54 cm  RIGHT VENTRICLE RV Basal diam:  3.00 cm RV S prime:     19.00 cm/s LEFT ATRIUM             Index        RIGHT ATRIUM           Index LA diam:        3.30 cm 1.56 cm/m   RA Area:     12.90 cm LA Vol (A2C):   32.6 ml 15.45 ml/m  RA Volume:  27.20 ml  12.89 ml/m LA Vol (A4C):   54.9 ml 26.03 ml/m LA Biplane Vol: 45.8 ml 21.71 ml/m  AORTIC VALVE                    PULMONIC VALVE AV Area (Vmax):    1.96 cm     PV Vmax:       1.03 m/s AV Area (Vmean):   1.90 cm     PV Vmean:      63.500 cm/s AV Area (VTI):     1.64 cm     PV VTI:        0.171 m AV Vmax:           134.00 cm/s  PV Peak grad:  4.2 mmHg AV Vmean:          93.800 cm/s  PV Mean grad:  2.0 mmHg AV VTI:            0.237 m AV Peak Grad:      7.2 mmHg AV Mean Grad:      4.0 mmHg LVOT Vmax:         103.00 cm/s LVOT Vmean:        70.200 cm/s LVOT VTI:          0.153 m LVOT/AV VTI ratio: 0.65  AORTA Ao Root diam: 3.10 cm MITRAL VALVE MV Area (PHT): 5.66 cm    SHUNTS MV Decel Time: 134 msec    Systemic VTI:  0.15 m MV E velocity: 81.80 cm/s  Systemic Diam: 1.80 cm MV A velocity: 81.80 cm/s MV E/A ratio:  1.00 Isaias Cowman MD Electronically signed by Isaias Cowman MD Signature Date/Time: 04/21/2022/9:57:17 AM    Final    MR BRAIN W WO CONTRAST  Result Date: 04/18/2022 CLINICAL DATA:  Altered mental status EXAM: MRI HEAD WITHOUT AND WITH CONTRAST TECHNIQUE: Multiplanar, multiecho pulse sequences of the brain and surrounding structures were obtained without and with intravenous contrast. CONTRAST:  43mL GADAVIST GADOBUTROL 1 MMOL/ML IV SOLN COMPARISON:  None Available. FINDINGS: Brain: No acute infarction, hemorrhage, hydrocephalus, extra-axial collection or  mass lesion. There is diffuse, advanced volume loss for age. Ex vacuo ventriculomegaly. No chronic microhemorrhage. There is multifocal hyperintense T2-weighted signal within the periventricular and deep white matter. Vascular: Normal flow voids. Skull and upper cervical spine: Normal marrow signal. Sinuses/Orbits: Negative. Other: None IMPRESSION: 1. No acute intracranial abnormality. 2. Advanced diffuse volume loss for age with periventricular white matter hyperintensities. 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   On: 04/18/2022 19:33   DG Chest Port 1 View  Result Date: 04/18/2022 CLINICAL DATA:  67 year old female with history of aspiration. EXAM: PORTABLE CHEST 1 VIEW COMPARISON:  Chest x-ray 03/21/2017. FINDINGS: Lung volumes are slightly low. No confluent consolidative airspace disease. No pleural effusions. Diffuse interstitial prominence and peribronchial cuffing noted in the lungs bilaterally. No pneumothorax. No evidence of pulmonary edema. Heart size is normal. Upper mediastinal contours are within normal limits. Atherosclerotic calcifications in the thoracic aorta. IMPRESSION: 1. The appearance of the chest suggest an acute bronchitis, as above. 2. Aortic atherosclerosis. Electronically Signed   By: Vinnie Langton M.D.   On: 04/18/2022 10:15   CT HEAD WO CONTRAST (5MM)  Result Date: 04/18/2022 CLINICAL DATA:  Mental status change, unknown cause; Neck trauma (Age >= 65y) EXAM: CT HEAD WITHOUT CONTRAST CT CERVICAL SPINE WITHOUT CONTRAST TECHNIQUE: Multidetector CT imaging of  the head and cervical spine was performed following the standard protocol without intravenous contrast. Multiplanar CT image reconstructions of the cervical spine were also generated. RADIATION DOSE REDUCTION: This exam was performed according to the departmental dose-optimization program which includes automated  exposure control, adjustment of the mA and/or kV according to patient size and/or use of iterative reconstruction technique. COMPARISON:  CT head 03/23/2017 FINDINGS: CT HEAD FINDINGS Brain: Moderate parenchymal volume loss is present, relatively advanced given the patient's age. Superimposed mild periventricular white matter changes are present, likely reflecting the sequela of small vessel ischemia. There is moderate ventriculomegaly which appears progressive since prior examination and appears disproportionate to the degree of parenchymal volume loss. While this may represent asymmetric central atrophy, changes of normal pressure hydrocephalus could appear similarly. No evidence of acute intracranial hemorrhage or infarct. No abnormal mass effect or midline shift. No abnormal intra or extra-axial mass lesion or fluid collection. Cerebellum unremarkable. Vascular: No hyperdense vessel or unexpected calcification. Skull: Normal. Negative for fracture or focal lesion. Sinuses/Orbits: No acute finding. Other: Mastoid air cells and middle ear cavities are clear. CT CERVICAL SPINE FINDINGS Alignment: Mild reversal of the normal cervical lordosis. No listhesis. Skull base and vertebrae: Craniocervical alignment is normal. The atlantodental interval is not widened. No acute fracture of the cervical spine. Vertebral body height is preserved. Soft tissues and spinal canal: No prevertebral fluid or swelling. No visible canal hematoma. Disc levels: Intervertebral disc space narrowing and endplate remodeling is seen at C5-C7, more severe at C5-6 in keeping with changes of moderate to severe degenerative disc disease. Prevertebral soft tissues are not thickened on sagittal reformats. Spinal canal is widely patent. No high-grade neuroforaminal narrowing identified. Upper chest: Negative. Other: None IMPRESSION: 1. No acute intracranial abnormality. No calvarial fracture. 2. Progressive ventriculomegaly, disproportionate to the  degree of parenchymal volume loss. While this may represent asymmetric central atrophy, changes of normal pressure hydrocephalus could appear similarly. 3. No acute fracture or listhesis of the cervical spine. Electronically Signed   By: Fidela Salisbury M.D.   On: 04/18/2022 00:53   CT Cervical Spine Wo Contrast  Result Date: 04/18/2022 CLINICAL DATA:  Mental status change, unknown cause; Neck trauma (Age >= 65y) EXAM: CT HEAD WITHOUT CONTRAST CT CERVICAL SPINE WITHOUT CONTRAST TECHNIQUE: Multidetector CT imaging of the head and cervical spine was performed following the standard protocol without intravenous contrast. Multiplanar CT image reconstructions of the cervical spine were also generated. RADIATION DOSE REDUCTION: This exam was performed according to the departmental dose-optimization program which includes automated exposure control, adjustment of the mA and/or kV according to patient size and/or use of iterative reconstruction technique. COMPARISON:  CT head 03/23/2017 FINDINGS: CT HEAD FINDINGS Brain: Moderate parenchymal volume loss is present, relatively advanced given the patient's age. Superimposed mild periventricular white matter changes are present, likely reflecting the sequela of small vessel ischemia. There is moderate ventriculomegaly which appears progressive since prior examination and appears disproportionate to the degree of parenchymal volume loss. While this may represent asymmetric central atrophy, changes of normal pressure hydrocephalus could appear similarly. No evidence of acute intracranial hemorrhage or infarct. No abnormal mass effect or midline shift. No abnormal intra or extra-axial mass lesion or fluid collection. Cerebellum unremarkable. Vascular: No hyperdense vessel or unexpected calcification. Skull: Normal. Negative for fracture or focal lesion. Sinuses/Orbits: No acute finding. Other: Mastoid air cells and middle ear cavities are clear. CT CERVICAL SPINE FINDINGS  Alignment: Mild reversal of the normal cervical lordosis. No listhesis. Skull base and  vertebrae: Craniocervical alignment is normal. The atlantodental interval is not widened. No acute fracture of the cervical spine. Vertebral body height is preserved. Soft tissues and spinal canal: No prevertebral fluid or swelling. No visible canal hematoma. Disc levels: Intervertebral disc space narrowing and endplate remodeling is seen at C5-C7, more severe at C5-6 in keeping with changes of moderate to severe degenerative disc disease. Prevertebral soft tissues are not thickened on sagittal reformats. Spinal canal is widely patent. No high-grade neuroforaminal narrowing identified. Upper chest: Negative. Other: None IMPRESSION: 1. No acute intracranial abnormality. No calvarial fracture. 2. Progressive ventriculomegaly, disproportionate to the degree of parenchymal volume loss. While this may represent asymmetric central atrophy, changes of normal pressure hydrocephalus could appear similarly. 3. No acute fracture or listhesis of the cervical spine. Electronically Signed   By: Fidela Salisbury M.D.   On: 04/18/2022 00:53   CT ABDOMEN PELVIS WO CONTRAST  Result Date: 04/17/2022 CLINICAL DATA:  Abdominal pain, acute, nonlocalized EXAM: CT ABDOMEN AND PELVIS WITHOUT CONTRAST TECHNIQUE: Multidetector CT imaging of the abdomen and pelvis was performed following the standard protocol without IV contrast. RADIATION DOSE REDUCTION: This exam was performed according to the departmental dose-optimization program which includes automated exposure control, adjustment of the mA and/or kV according to patient size and/or use of iterative reconstruction technique. COMPARISON:  None Available. FINDINGS: Lower chest: No acute findings.  Small hiatal hernia. Hepatobiliary: No focal hepatic abnormality. Gallbladder unremarkable. Pancreas: No focal abnormality or ductal dilatation. Spleen: No focal abnormality.  Normal size. Adrenals/Urinary  Tract: 2 mm nonobstructing stone in the lower pole of the left kidney. No ureteral stones or hydronephrosis. Adrenal glands and urinary bladder unremarkable. Stomach/Bowel: Normal appendix. Stomach, large and small bowel grossly unremarkable. Vascular/Lymphatic: Aortic atherosclerosis. No evidence of aneurysm or adenopathy. Reproductive: Uterus and adnexa unremarkable.  No mass. Other: No free fluid or free air. Musculoskeletal: No acute bony abnormality. IMPRESSION: No acute findings. Punctate left lower pole nephrolithiasis.  No hydronephrosis. Aortic atherosclerosis. Electronically Signed   By: Rolm Baptise M.D.   On: 04/17/2022 18:15    Microbiology: Results for orders placed or performed during the hospital encounter of 04/17/22  Urine Culture     Status: Abnormal   Collection Time: 04/18/22  2:00 PM   Specimen: Urine, Clean Catch  Result Value Ref Range Status   Specimen Description   Final    URINE, CLEAN CATCH Performed at [facility], [address] [place], [place] [address]    Special Requests   Final    NONE Performed at [facility], [address] [place], [place] [address]    Culture >=100,000 COLONIES/mL KLEBSIELLA PNEUMONIAE (A)  Final   Report Status 04/20/2022 FINAL  Final   Organism ID, Bacteria KLEBSIELLA PNEUMONIAE (A)  Final      Susceptibility   Klebsiella pneumoniae - MIC*    AMPICILLIN RESISTANT Resistant     CEFAZOLIN <=4 SENSITIVE Sensitive     CEFEPIME <=0.12 SENSITIVE Sensitive     CEFTRIAXONE <=0.25 SENSITIVE Sensitive     CIPROFLOXACIN <=0.25 SENSITIVE Sensitive     GENTAMICIN <=1 SENSITIVE Sensitive     IMIPENEM <=0.25 SENSITIVE Sensitive     NITROFURANTOIN <=16 SENSITIVE Sensitive     TRIMETH/SULFA <=20 SENSITIVE Sensitive     AMPICILLIN/SULBACTAM <=2 SENSITIVE Sensitive     PIP/TAZO <=4 SENSITIVE Sensitive     * >=100,000 COLONIES/mL KLEBSIELLA PNEUMONIAE  MRSA Next Gen by PCR, Nasal     Status: None   Collection Time:  04/20/22  4:52 PM   Specimen: Nasal Mucosa; Nasal Swab  Result Value Ref Range Status   MRSA by PCR Next Gen NOT DETECTED NOT DETECTED Final    Comment: (NOTE) The GeneXpert MRSA Assay (FDA approved for NASAL specimens only), is one component of a comprehensive MRSA colonization surveillance program. It is not intended to diagnose MRSA infection nor to guide or monitor treatment for MRSA infections. Test performance is not FDA approved in patients less than 32 years old. Performed at [facility], [address] [place], [place] [address]   Culture, blood (Routine X 2) w Reflex to ID Panel     Status: None   Collection Time: 04/20/22  5:25 PM   Specimen: BLOOD  Result Value Ref Range Status   Specimen Description BLOOD BLOOD LEFT ARM  Final   Special Requests   Final    BOTTLES DRAWN AEROBIC AND ANAEROBIC Blood Culture adequate volume   Culture   Final    NO GROWTH 5 DAYS Performed at [facility], [address], [place], [place] [address]    Report Status 04/25/2022 FINAL  Final  Culture, blood (Routine X 2) w Reflex to ID Panel     Status: None   Collection Time: 04/20/22  5:33 PM   Specimen: BLOOD  Result Value Ref Range Status   Specimen Description BLOOD BLOOD RIGHT HAND  Final   Special Requests   Final    BOTTLES DRAWN AEROBIC AND ANAEROBIC Blood Culture adequate volume   Culture   Final    NO GROWTH 5 DAYS Performed at [facility], [address] [place], [place] [address]    Report Status 04/25/2022 FINAL  Final  Resp panel by RT-PCR (RSV, Flu A&B, Covid) Anterior Nasal Swab     Status: None   Collection Time: 04/21/22 10:22 PM   Specimen: Anterior Nasal Swab  Result Value Ref Range Status   SARS Coronavirus 2 by RT PCR NEGATIVE NEGATIVE Final    Comment: (NOTE) SARS-CoV-2 target nucleic acids are NOT DETECTED.  The SARS-CoV-2 RNA is generally detectable in upper respiratory specimens during the acute phase of infection. The  lowest concentration of SARS-CoV-2 viral copies this assay can detect is 138 copies/mL. A negative result does not preclude SARS-Cov-2 infection and should not be used as the sole basis for treatment or other patient management decisions. A negative result may occur with  improper specimen collection/handling, submission of specimen other than nasopharyngeal swab, presence of viral mutation(s) within the areas targeted by this assay, and inadequate number of viral copies(<138 copies/mL). A negative result must be combined with clinical observations, patient history, and epidemiological information. The expected result is Negative.  Fact Sheet for Patients:  EntrepreneurPulse.com.au  Fact Sheet for Healthcare Providers:  IncredibleEmployment.be  This test is no t yet approved or cleared by the [place] FDA and  has been authorized for detection and/or diagnosis of SARS-CoV-2 by FDA under an Emergency Use Authorization (EUA). This EUA will remain  in effect (meaning this test can be used) for the duration of the COVID-19 declaration under Section 564(b)(1) of the Act, 21 U.S.C.section 360bbb-3(b)(1), unless the authorization is terminated  or revoked sooner.       Influenza A by PCR NEGATIVE NEGATIVE Final   Influenza B by PCR NEGATIVE NEGATIVE Final    Comment: (NOTE) The Xpert Xpress SARS-CoV-2/FLU/RSV plus assay is intended as an aid in the diagnosis of influenza from Nasopharyngeal swab specimens and should not  be used as a sole basis for treatment. Nasal washings and aspirates are unacceptable for Xpert Xpress SARS-CoV-2/FLU/RSV testing.  Fact Sheet for Patients: EntrepreneurPulse.com.au  Fact Sheet for Healthcare Providers: IncredibleEmployment.be  This test is not yet approved or cleared by the [place] FDA and has been authorized for detection and/or diagnosis of SARS-CoV-2 by FDA under  an Emergency Use Authorization (EUA). This EUA will remain in effect (meaning this test can be used) for the duration of the COVID-19 declaration under Section 564(b)(1) of the Act, 21 U.S.C. section 360bbb-3(b)(1), unless the authorization is terminated or revoked.     Resp Syncytial Virus by PCR NEGATIVE NEGATIVE Final    Comment: (NOTE) Fact Sheet for Patients: EntrepreneurPulse.com.au  Fact Sheet for Healthcare Providers: IncredibleEmployment.be  This test is not yet approved or cleared by the [place] FDA and has been authorized for detection and/or diagnosis of SARS-CoV-2 by FDA under an Emergency Use Authorization (EUA). This EUA will remain in effect (meaning this test can be used) for the duration of the COVID-19 declaration under Section 564(b)(1) of the Act, 21 U.S.C. section 360bbb-3(b)(1), unless the authorization is terminated or revoked.  Performed at [facility], [address] [place], [place] [address]   Culture, blood (Routine X 2) w Reflex to ID Panel     Status: None   Collection Time: 04/23/22  3:20 PM   Specimen: BLOOD  Result Value Ref Range Status   Specimen Description BLOOD  Final   Special Requests BOTTLES DRAWN AEROBIC AND ANAEROBIC BCLV  Final   Culture   Final    NO GROWTH 5 DAYS Performed at [facility], [address] [place], [place] [address]    Report Status 04/28/2022 FINAL  Final  Culture, blood (Routine X 2) w Reflex to ID Panel     Status: None   Collection Time: 04/23/22  3:23 PM   Specimen: BLOOD  Result Value Ref Range Status   Specimen Description BLOOD BLOOD LEFT HAND  Final   Special Requests BOTTLES DRAWN AEROBIC AND ANAEROBIC BCAV  Final   Culture   Final    NO GROWTH 5 DAYS Performed at [facility], [address] [place], [place] [address]    Report Status 04/28/2022 FINAL  Final  Culture, Respiratory w Gram Stain     Status: None   Collection  Time: 04/23/22  3:50 PM  Result Value Ref Range Status   Specimen Description   Final    EXPECTORATED SPUTUM Performed at [facility], [address], [place], [place] [address]    Special Requests   Final    NONE Performed at [facility], [address] [place], [place] [address]    Gram Stain   Final    MODERATE WBC PRESENT, PREDOMINANTLY PMN NO ORGANISMS SEEN    Culture   Final    Normal respiratory flora-no Staph aureus or Pseudomonas seen Performed at [facility] [address] [place],  [address]    Report Status 04/26/2022 FINAL  Final    Labs: CBC: Recent Labs  Lab 05/01/22 0610  WBC 12.9*  HGB 9.2*  HCT 29.1*  MCV 102.1*  PLT 620*   Basic Metabolic Panel: Recent Labs  Lab 05/01/22 0610  NA 140  K 4.0  CL 107  CO2 24  GLUCOSE 244*  BUN 22  CREATININE 1.76*  CALCIUM 8.6*  MG 1.6*  PHOS 3.8   Liver Function Tests: Recent Labs  Lab 05/01/22 0610  AST 19  ALT 7  ALKPHOS 81  BILITOT 0.3  PROT 6.3*  ALBUMIN 2.2*   CBG: Recent Labs  Lab 05/05/22 1545 05/05/22 2108 05/06/22 0017 05/06/22 0441 05/06/22 0759  GLUCAP 250* 168* 159* 220* 146*    Discharge time spent: greater than 30 minutes.  This record has been created using Systems analyst. Errors have been sought and corrected,but may not always be located. Such creation errors do not reflect on the standard of care.   Signed: Lorella Nimrod, MD [facility] 05/06/2022

## 2022-05-05 NOTE — Progress Notes (Signed)
Occupational Therapy Treatment Patient Details Name: Laurie Lin MRN: [id number] DOB: Sep 07, 1955 Today's Date: 05/05/2022   History of present illness Pt is a 67 y/o F admitted on 04/17/22 after presenting with c/o worsening confusion for the last month. Work up revealed UTI, AKI, hypomagnesemia, hyperkalemia. During admission pt became more agitated & intubated on 1/1, extubated 1/4. PEG placement on 1/9. PMH: DM1, CKD 3A, HTN, alcohol use   OT comments  Pt agreeable to OT/PT co-treatment to maximize safety and participation. Upon entering session, pt supine in bed. She continues to require +2 for bed mobility and functional transfers. Pt demonstrated L lateral lean in sitting/standing requiring physical assistance to correct. OT utilizing sing single step commands t/o session with good outcome. She required Max multimodal cuing for initiation/sequencing and constant redirection back to task. Pt left as received with all needs in reach. Pt is making progress toward goal completion. D/C recommendation remains appropriate. OT will continue to follow acutely.    Recommendations for follow up therapy are one component of a multi-disciplinary discharge planning process, led by the attending physician.  Recommendations may be updated based on patient status, additional functional criteria and insurance authorization.    Follow Up Recommendations  Skilled nursing-short term rehab (<3 hours/day)     Assistance Recommended at Discharge Frequent or constant Supervision/Assistance  Patient can return home with the following  Two people to help with walking and/or transfers;Two people to help with bathing/dressing/bathroom   Equipment Recommendations  Other (comment) (defer to next venue of care)    Recommendations for Other Services      Precautions / Restrictions Precautions Precautions: Fall Precaution Comments: PEG, mittens, foley Restrictions Weight Bearing Restrictions: No        Mobility Bed Mobility Overal bed mobility: Needs Assistance Bed Mobility: Supine to Sit, Sit to Supine     Supine to sit: Total assist, +2 for physical assistance, +2 for safety/equipment Sit to supine: Total assist, +2 for safety/equipment, +2 for physical assistance        Transfers Overall transfer level: Needs assistance Equipment used: 2 person hand held assist Transfers: Sit to/from Stand Sit to Stand: Max assist, +2 safety/equipment, +2 physical assistance           General transfer comment: patient initiating rocking prior to standing but on first attempt required total A+2 with inability to clear buttocks but with poor activation from patient. On second attempt, able to stand with maxA+2 to come into standing but immediately with L lateral lean     Balance Overall balance assessment: Needs assistance Sitting-balance support: Feet supported, Bilateral upper extremity supported Sitting balance-Leahy Scale: Fair Sitting balance - Comments: L lateral lean (worsening when attempting to complete another task, i.e. wash face) Postural control: Left lateral lean Standing balance support: Bilateral upper extremity supported, Reliant on assistive device for balance Standing balance-Leahy Scale: Poor                             ADL either performed or assessed with clinical judgement   ADL Overall ADL's : Needs assistance/impaired     Grooming: Sitting;Wash/dry face;Maximal assistance;Cueing for sequencing Grooming Details (indicate cue type and reason): hand over hand assistance to bring washcloth to face, once initiated by therapist pt able to wash forehead/cheeks with OT supporting UE  Extremity/Trunk Assessment Upper Extremity Assessment Upper Extremity Assessment: Generalized weakness   Lower Extremity Assessment Lower Extremity Assessment: Generalized weakness        Vision Patient Visual Report:  No change from baseline     Perception     Praxis      Cognition Arousal/Alertness: Awake/alert Behavior During Therapy: Flat affect Overall Cognitive Status: Impaired/Different from baseline Area of Impairment: Orientation, Attention, Memory, Following commands, Safety/judgement, Awareness, Problem solving                 Orientation Level: Disoriented to, Place, Time, Situation Current Attention Level: Focused Memory: Decreased short-term memory Following Commands: Follows one step commands inconsistently Safety/Judgement: Decreased awareness of safety, Decreased awareness of deficits Awareness: Intellectual Problem Solving: Slow processing, Decreased initiation, Difficulty sequencing, Requires tactile cues, Requires verbal cues General Comments: answers to name but unable to verbalize name/DOB. Following 1 step commands <25% of session with multimodal cueing. Unable to perform ADL task and maintain sitting balance simultaneously. Unable to divide attention, distracted easily, constant redirection back to task required.        Exercises      Shoulder Instructions       General Comments      Pertinent Vitals/ Pain       Pain Assessment Pain Assessment: Faces Faces Pain Scale: No hurt Pain Intervention(s): Monitored during session  Home Living                                          Prior Functioning/Environment              Frequency  Min 2X/week        Progress Toward Goals  OT Goals(current goals can now be found in the care plan section)  Progress towards OT goals: Progressing toward goals  Acute Rehab OT Goals OT Goal Formulation: Patient unable to participate in goal setting  Plan Discharge plan remains appropriate;Frequency remains appropriate    Co-evaluation    PT/OT/SLP Co-Evaluation/Treatment: Yes Reason for Co-Treatment: Complexity of the patient's impairments (multi-system involvement);Necessary to address  cognition/behavior during functional activity;For patient/therapist safety;To address functional/ADL transfers PT goals addressed during session: Mobility/safety with mobility;Balance OT goals addressed during session: ADL's and self-care      AM-PAC OT "6 Clicks" Daily Activity     Outcome Measure   Help from another person eating meals?: Total Help from another person taking care of personal grooming?: Total Help from another person toileting, which includes using toliet, bedpan, or urinal?: Total Help from another person bathing (including washing, rinsing, drying)?: Total Help from another person to put on and taking off regular upper body clothing?: Total Help from another person to put on and taking off regular lower body clothing?: Total 6 Click Score: 6    End of Session    OT Visit Diagnosis: Other abnormalities of gait and mobility (R26.89);Other symptoms and signs involving cognitive function   Activity Tolerance Patient tolerated treatment well   Patient Left in bed;with call bell/phone within reach;with bed alarm set;Other (comment) (mitts reapplied)   Nurse Communication Mobility status        Time: 1036-1100 OT Time Calculation (min): 24 min  Charges: OT General Charges $OT Visit: 1 Visit OT Treatments $Self Care/Home Management : 8-22 mins  [facility] MS, OTR/L ascom [phone]  05/05/22, 11:59 AM

## 2022-05-06 ENCOUNTER — Inpatient Hospital Stay (HOSPITAL_COMMUNITY)
Admission: EM | Admit: 2022-05-06 | Discharge: 2022-05-12 | Disposition: A | Discharge status: Skilled Nursing Facility | Payer: Medicare Other | Source: Skilled Nursing Facility | Attending: Internal Medicine | Admitting: Internal Medicine

## 2022-05-06 ENCOUNTER — Emergency Department: Payer: Medicare Other

## 2022-05-06 ENCOUNTER — Other Ambulatory Visit: Payer: Self-pay

## 2022-05-06 DIAGNOSIS — N1832 Chronic kidney disease, stage 3b: Secondary | ICD-10-CM | POA: Diagnosis present

## 2022-05-06 DIAGNOSIS — K921 Melena: Secondary | ICD-10-CM | POA: Diagnosis present

## 2022-05-06 DIAGNOSIS — D72828 Other elevated white blood cell count: Secondary | ICD-10-CM | POA: Diagnosis not present

## 2022-05-06 DIAGNOSIS — Z833 Family history of diabetes mellitus: Secondary | ICD-10-CM

## 2022-05-06 DIAGNOSIS — Z79899 Other long term (current) drug therapy: Secondary | ICD-10-CM

## 2022-05-06 DIAGNOSIS — Z886 Allergy status to analgesic agent status: Secondary | ICD-10-CM

## 2022-05-06 DIAGNOSIS — Z931 Gastrostomy status: Secondary | ICD-10-CM

## 2022-05-06 DIAGNOSIS — F1010 Alcohol abuse, uncomplicated: Secondary | ICD-10-CM | POA: Diagnosis not present

## 2022-05-06 DIAGNOSIS — J90 Pleural effusion, not elsewhere classified: Secondary | ICD-10-CM | POA: Diagnosis present

## 2022-05-06 DIAGNOSIS — Z888 Allergy status to other drugs, medicaments and biological substances status: Secondary | ICD-10-CM

## 2022-05-06 DIAGNOSIS — E1065 Type 1 diabetes mellitus with hyperglycemia: Secondary | ICD-10-CM

## 2022-05-06 DIAGNOSIS — K7460 Unspecified cirrhosis of liver: Secondary | ICD-10-CM | POA: Diagnosis present

## 2022-05-06 DIAGNOSIS — D649 Anemia, unspecified: Secondary | ICD-10-CM | POA: Diagnosis not present

## 2022-05-06 DIAGNOSIS — Z91040 Latex allergy status: Secondary | ICD-10-CM

## 2022-05-06 DIAGNOSIS — E1022 Type 1 diabetes mellitus with diabetic chronic kidney disease: Secondary | ICD-10-CM | POA: Diagnosis present

## 2022-05-06 DIAGNOSIS — K922 Gastrointestinal hemorrhage, unspecified: Secondary | ICD-10-CM | POA: Diagnosis not present

## 2022-05-06 DIAGNOSIS — F04 Amnestic disorder due to known physiological condition: Secondary | ICD-10-CM | POA: Diagnosis present

## 2022-05-06 DIAGNOSIS — K449 Diaphragmatic hernia without obstruction or gangrene: Secondary | ICD-10-CM

## 2022-05-06 DIAGNOSIS — F1096 Alcohol use, unspecified with alcohol-induced persisting amnestic disorder: Secondary | ICD-10-CM | POA: Diagnosis present

## 2022-05-06 DIAGNOSIS — J690 Pneumonitis due to inhalation of food and vomit: Secondary | ICD-10-CM | POA: Diagnosis not present

## 2022-05-06 DIAGNOSIS — D539 Nutritional anemia, unspecified: Secondary | ICD-10-CM | POA: Diagnosis present

## 2022-05-06 DIAGNOSIS — N183 Chronic kidney disease, stage 3 unspecified: Secondary | ICD-10-CM | POA: Diagnosis present

## 2022-05-06 DIAGNOSIS — Z794 Long term (current) use of insulin: Secondary | ICD-10-CM

## 2022-05-06 DIAGNOSIS — I129 Hypertensive chronic kidney disease with stage 1 through stage 4 chronic kidney disease, or unspecified chronic kidney disease: Secondary | ICD-10-CM | POA: Diagnosis present

## 2022-05-06 DIAGNOSIS — E1042 Type 1 diabetes mellitus with diabetic polyneuropathy: Secondary | ICD-10-CM | POA: Diagnosis present

## 2022-05-06 DIAGNOSIS — R509 Fever, unspecified: Secondary | ICD-10-CM | POA: Diagnosis not present

## 2022-05-06 DIAGNOSIS — E538 Deficiency of other specified B group vitamins: Secondary | ICD-10-CM | POA: Diagnosis present

## 2022-05-06 DIAGNOSIS — D62 Acute posthemorrhagic anemia: Secondary | ICD-10-CM | POA: Diagnosis present

## 2022-05-06 DIAGNOSIS — K219 Gastro-esophageal reflux disease without esophagitis: Secondary | ICD-10-CM

## 2022-05-06 DIAGNOSIS — I471 Supraventricular tachycardia, unspecified: Secondary | ICD-10-CM | POA: Diagnosis not present

## 2022-05-06 DIAGNOSIS — K5900 Constipation, unspecified: Secondary | ICD-10-CM | POA: Diagnosis present

## 2022-05-06 DIAGNOSIS — G9340 Encephalopathy, unspecified: Secondary | ICD-10-CM | POA: Diagnosis not present

## 2022-05-06 LAB — CBC
HCT: 30.7 % — ABNORMAL LOW (ref 36.0–46.0)
HCT: 28.2 % — ABNORMAL LOW (ref 36.0–46.0)
Hemoglobin: 9.6 g/dL — ABNORMAL LOW (ref 12.0–15.0)
Hemoglobin: 8.9 g/dL — ABNORMAL LOW (ref 12.0–15.0)
MCH: 31.6 pg (ref 26.0–34.0)
MCH: 31.7 pg (ref 26.0–34.0)
MCHC: 31.3 g/dL (ref 30.0–36.0)
MCHC: 31.6 g/dL (ref 30.0–36.0)
MCV: 101 fL — ABNORMAL HIGH (ref 80.0–100.0)
MCV: 100.4 fL — ABNORMAL HIGH (ref 80.0–100.0)
Platelets: 779 10*3/uL — ABNORMAL HIGH (ref 150–400)
Platelets: 758 10*3/uL — ABNORMAL HIGH (ref 150–400)
RBC: 3.04 MIL/uL — ABNORMAL LOW (ref 3.87–5.11)
RBC: 2.81 MIL/uL — ABNORMAL LOW (ref 3.87–5.11)
RDW: 13.3 % (ref 11.5–15.5)
RDW: 13.2 % (ref 11.5–15.5)
WBC: 13.3 10*3/uL — ABNORMAL HIGH (ref 4.0–10.5)
WBC: 13 10*3/uL — ABNORMAL HIGH (ref 4.0–10.5)
nRBC: 0 % (ref 0.0–0.2)
nRBC: 0 % (ref 0.0–0.2)

## 2022-05-06 LAB — COMPREHENSIVE METABOLIC PANEL
ALT: 7 U/L (ref 0–44)
AST: 15 U/L (ref 15–41)
Albumin: 2.5 g/dL — ABNORMAL LOW (ref 3.5–5.0)
Alkaline Phosphatase: 58 U/L (ref 38–126)
Anion gap: 11 (ref 5–15)
BUN: 32 mg/dL — ABNORMAL HIGH (ref 8–23)
CO2: 22 mmol/L (ref 22–32)
Calcium: 8.8 mg/dL — ABNORMAL LOW (ref 8.9–10.3)
Chloride: 106 mmol/L (ref 98–111)
Creatinine, Ser: 1.79 mg/dL — ABNORMAL HIGH (ref 0.44–1.00)
GFR, Estimated: 31 mL/min — ABNORMAL LOW (ref >60)
Glucose, Bld: 187 mg/dL — ABNORMAL HIGH (ref 70–99)
Potassium: 5.3 mmol/L — ABNORMAL HIGH (ref 3.5–5.1)
Sodium: 139 mmol/L (ref 135–145)
Total Bilirubin: 0.6 mg/dL (ref 0.3–1.2)
Total Protein: 6.8 g/dL (ref 6.5–8.1)

## 2022-05-06 LAB — PROTIME-INR
INR: 1 (ref 0.8–1.2)
Prothrombin Time: 13.3 seconds (ref 11.4–15.2)

## 2022-05-06 LAB — TYPE AND SCREEN
ABO/RH(D): A POS
Antibody Screen: NEGATIVE

## 2022-05-06 LAB — GLUCOSE, CAPILLARY
Glucose-Capillary: 146 mg/dL — ABNORMAL HIGH (ref 70–99)
Glucose-Capillary: 159 mg/dL — ABNORMAL HIGH (ref 70–99)
Glucose-Capillary: 192 mg/dL — ABNORMAL HIGH (ref 70–99)
Glucose-Capillary: 220 mg/dL — ABNORMAL HIGH (ref 70–99)

## 2022-05-06 LAB — VITAMIN D 25 HYDROXY (VIT D DEFICIENCY, FRACTURES)

## 2022-05-06 LAB — LIPASE, BLOOD: Lipase: 70 U/L — ABNORMAL HIGH (ref 11–51)

## 2022-05-06 LAB — CBG MONITORING, ED: Glucose-Capillary: 194 mg/dL — ABNORMAL HIGH (ref 70–99)

## 2022-05-06 MED ORDER — PANTOPRAZOLE SODIUM 40 MG IV SOLR: 40.0000 mg | Freq: Two times a day (BID) | INTRAVENOUS | Status: DC

## 2022-05-06 MED ORDER — LOPERAMIDE HCL 1 MG/7.5ML PO SUSP: 2.0000 mg | ORAL | Status: DC | PRN

## 2022-05-06 MED ORDER — HYDROXYZINE HCL 10 MG PO TABS: 10.0000 mg | ORAL_TABLET | Freq: Three times a day (TID) | ORAL | Status: DC | PRN

## 2022-05-06 MED ORDER — INSULIN ASPART 100 UNIT/ML IJ SOLN
0.0000 [IU] | INTRAMUSCULAR | Status: DC
  Administered 2022-05-07: 2 [IU] via SUBCUTANEOUS
  Administered 2022-05-07: 1 [IU] via SUBCUTANEOUS
  Administered 2022-05-07: 3 [IU] via SUBCUTANEOUS
  Administered 2022-05-07 (×2): 2 [IU] via SUBCUTANEOUS
  Administered 2022-05-08: 7 [IU] via SUBCUTANEOUS
  Administered 2022-05-08: 9 [IU] via SUBCUTANEOUS
  Administered 2022-05-08 (×2): 5 [IU] via SUBCUTANEOUS
  Administered 2022-05-08: 2 [IU] via SUBCUTANEOUS
  Administered 2022-05-09: 5 [IU] via SUBCUTANEOUS
  Filled 2022-05-06 (×10): qty 1

## 2022-05-06 MED ORDER — PANTOPRAZOLE 80MG IVPB - SIMPLE MED
80.0000 mg | Freq: Once | INTRAVENOUS | Status: AC
  Administered 2022-05-06: 80 mg via INTRAVENOUS
  Filled 2022-05-06: qty 100

## 2022-05-06 MED ORDER — MAGNESIUM HYDROXIDE 400 MG/5ML PO SUSP: 30.0000 mL | Freq: Every day | ORAL | Status: DC | PRN

## 2022-05-06 MED ORDER — FREE WATER
140.0000 mL | Freq: Every day | Status: DC
  Administered 2022-05-06: 140 mL

## 2022-05-06 MED ORDER — LOPERAMIDE HCL 1 MG/7.5ML PO SUSP
2.0000 mg | ORAL | Status: DC | PRN
  Filled 2022-05-06: qty 15

## 2022-05-06 MED ORDER — NETARSUDIL DIMESYLATE 0.02 % OP SOLN
1.0000 [drp] | Freq: Every evening | OPHTHALMIC | Status: DC
  Administered 2022-05-07 – 2022-05-11 (×5): 1 [drp] via OPHTHALMIC
  Filled 2022-05-06: qty 2.5

## 2022-05-06 MED ORDER — METOCLOPRAMIDE HCL 10 MG/10ML PO SOLN
5.0000 mg | Freq: Three times a day (TID) | ORAL | Status: DC
  Administered 2022-05-07 – 2022-05-12 (×14): 5 mg
  Filled 2022-05-06 (×7): qty 10
  Filled 2022-05-06: qty 5
  Filled 2022-05-06 (×5): qty 10
  Filled 2022-05-06: qty 5
  Filled 2022-05-06 (×3): qty 10
  Filled 2022-05-06: qty 5
  Filled 2022-05-06: qty 10

## 2022-05-06 MED ORDER — POLYETHYLENE GLYCOL 3350 17 G PO PACK: 17.0000 g | PACK | Freq: Every day | ORAL | Status: DC | PRN

## 2022-05-06 MED ORDER — PANTOPRAZOLE SODIUM 40 MG IV SOLR
40.0000 mg | Freq: Two times a day (BID) | INTRAVENOUS | Status: DC
  Administered 2022-05-07 – 2022-05-12 (×12): 40 mg via INTRAVENOUS
  Filled 2022-05-06 (×12): qty 10

## 2022-05-06 MED ORDER — ADULT MULTIVITAMIN W/MINERALS CH
1.0000 | ORAL_TABLET | Freq: Every day | ORAL | Status: DC
  Administered 2022-05-06 – 2022-05-12 (×7): 1
  Filled 2022-05-06 (×7): qty 1

## 2022-05-06 MED ORDER — INSULIN ASPART 100 UNIT/ML IJ SOLN: 5.0000 [IU] | Freq: Three times a day (TID) | INTRAMUSCULAR | 11 refills | Status: DC

## 2022-05-06 MED ORDER — THIAMINE MONONITRATE 100 MG PO TABS
100.0000 mg | ORAL_TABLET | Freq: Every day | ORAL | Status: DC
  Administered 2022-05-06 – 2022-05-12 (×7): 100 mg
  Filled 2022-05-06 (×7): qty 1

## 2022-05-06 MED ORDER — VITAMIN B-12 1000 MCG PO TABS
1000.0000 ug | ORAL_TABLET | Freq: Every day | ORAL | Status: DC
  Administered 2022-05-06 – 2022-05-12 (×7): 1000 ug
  Filled 2022-05-06 (×7): qty 1

## 2022-05-06 MED ORDER — OSMOLITE 1.5 CAL PO LIQD: 237.0000 mL | Freq: Every day | ORAL | 0 refills | Status: DC

## 2022-05-06 MED ORDER — OSMOLITE 1.5 CAL PO LIQD
237.0000 mL | Freq: Every day | ORAL | Status: DC
  Administered 2022-05-06: 237 mL

## 2022-05-06 MED ORDER — LOPERAMIDE HCL 1 MG/7.5ML PO SUSP: 2.0000 mg | ORAL | 0 refills | Status: DC | PRN

## 2022-05-06 MED ORDER — FOLIC ACID 1 MG PO TABS
1.0000 mg | ORAL_TABLET | Freq: Every day | ORAL | Status: DC
  Administered 2022-05-06 – 2022-05-12 (×7): 1 mg
  Filled 2022-05-06 (×7): qty 1

## 2022-05-06 MED ORDER — PANTOPRAZOLE INFUSION (NEW) - SIMPLE MED
8.0000 mg/h | INTRAVENOUS | Status: DC
  Administered 2022-05-06: 8 mg/h via INTRAVENOUS
  Filled 2022-05-06: qty 100

## 2022-05-06 MED ORDER — SODIUM CHLORIDE 0.9% FLUSH
3.0000 mL | Freq: Two times a day (BID) | INTRAVENOUS | Status: DC
  Administered 2022-05-07 – 2022-05-12 (×12): 3 mL via INTRAVENOUS

## 2022-05-06 MED ORDER — ACETAMINOPHEN 160 MG/5ML PO SOLN: 650.0000 mg | Freq: Four times a day (QID) | ORAL | Status: DC | PRN

## 2022-05-06 MED ORDER — FREE WATER: 140.0000 mL | Freq: Every day | Status: DC

## 2022-05-06 NOTE — Discharge Instructions (Signed)
  Bolus feeding regimen:  237 ml (1 carton ARC) Osmolite 1.5 6 times daily  70 ml free water flush before and after each feeding administration  Tube feeding regimen provides 2130 kcal (100% of needs), 90 grams of protein, and 1086 ml of H2O. Total free water: 1926 ml daily

## 2022-05-06 NOTE — ED Notes (Signed)
Barrier cream applied to buttocks and new diaper applied

## 2022-05-06 NOTE — ED Notes (Signed)
Pt family came to nurses station and let Nurse Lattie Haw know that pt had "vomited all over herself." Nurse Lattie Haw went to assess the pt, and found pt with brown emesis on her gown and chest. Nurse Lattie Haw cleaned pt up and put pt in a clean, dry gown. Nurse Lattie Haw also noted that the pt appeared to be shaking and checked her temperature which was 98.8 axillary and noted in the chart.

## 2022-05-06 NOTE — Assessment & Plan Note (Signed)
Patient presenting with sudden onset coffee-ground emesis with bright red blood per rectum.  Coffee-ground emesis was witnessed by EMS and bright red blood was witnessed by SNF staff.  With this history, difficult to discern if this is an upper or lower bleed or combination of both.  Hemodynamically stable right now with stable hemoglobin.  - GI consulted; appreciate their recommendations - N.p.o. - Hold tube feeds - Protonix 40 mg IV twice daily - If any hemodynamic compromise should occur or sharp decrease in hemoglobin, plan to obtain a CTA

## 2022-05-06 NOTE — Assessment & Plan Note (Signed)
-  Holding home tube feeds in the setting of GI bleed - SSI, sensitive every 4 hours while n.p.o.

## 2022-05-06 NOTE — ED Notes (Signed)
Pt at CT

## 2022-05-06 NOTE — ED Triage Notes (Signed)
Pt arrived via [facility] from [facility] after being discharged from [facility] this morning. Facility reported to EMS that pt has bloody stool and coffee ground emesis (that was still on floor that EMS confirmed seeing). Pt has peg tube with abdominal binder and foley cath.

## 2022-05-06 NOTE — Progress Notes (Signed)
Nutrition Follow-up  DOCUMENTATION CODES:   Not applicable  INTERVENTION:   Transition to bolus feedings:   237 ml (1 carton ARC) Osmolite 1.5 6 times daily  70 ml free water flush before and after each feeding administration  Tube feeding regimen provides 2130 kcal (100% of needs), 90 grams of protein, and 1086 ml of H2O. Total free water: 1926 ml daily   NUTRITION DIAGNOSIS:   Inadequate oral intake related to inability to eat (pt sedated and ventilated) as evidenced by NPO status.  Ongoing  GOAL:   Patient will meet greater than or equal to 90% of their needs  Met with TF  MONITOR:   Diet advancement, Labs, Weight trends, TF tolerance, Skin, I & O's  REASON FOR ASSESSMENT:   Consult Enteral/tube feeding initiation and management  ASSESSMENT:   67 y/o female with h/o IDDM, HTN, etoh abuse, GERD, hital hernia, neuropathy, cirrhosis, varices and CKD III who is admitted with encephalopathy, UTI and possible Wernickes.  1/8- s/p BSE- recommend continued NPO 1/9- s/p g-tube placement 1/10- TF initiated 1/11- s/p BSE- recommend NPO 1/12- s/p MBSS- NPO  Reviewed I/O's: -1.8 L x 24 hours and -1.4 L since 04/22/22  UOP: 1.8 L x 24 hours  Pt sitting up in bed at time of visit. Pt opened eyes when name was called and answered RD's questions, however, most responses are inappropriate.   Case discussed with SLP. Pt to remain NPO with 100% support on g-tube feeds secondary to MBSS results.   Case discussed with MD and TOC; desire to transition to bolus feedings to facilitate discharge to SNF. Plan tod/c to SNF ([facility]) today.    Reviewed wt hx; wt has been stable over the past week.   Medications reviewed and include vitamin C-58, folic acid, reglan, and thiamine.   Labs reviewed: CBGS: 850-277 (inpatient orders for glycemic control are 0-20 units insulin aspart every 4 hours, 5 units insulin aspart every 4 hours, and 12 units inuslin glargine-yfgn daily).     Diet Order:   Diet Order             Diet - low sodium heart healthy           Diet NPO time specified Except for: Ice Chips  Diet effective now                   EDUCATION NEEDS:   No education needs have been identified at this time  Skin:  Skin Assessment: Skin Integrity Issues: Skin Integrity Issues:: Other (Comment) Other: skin tear to lt hand  Last BM:  04/30/22  Height:   Ht Readings from Last 1 Encounters:  04/29/22 5\' 10"  (1.778 m)    Weight:   Wt Readings from Last 1 Encounters:  05/06/22 88.6 kg    Ideal Body Weight:  75.45 kg  BMI:  Body mass index is 28.03 kg/m.  Estimated Nutritional Needs:   Kcal:  1900-2200kcal/day  Protein:  95-110g/day  Fluid:  > 1.9 L    Loistine Chance, RD, LDN, [place] Registered Dietitian II Certified Diabetes Care and Education Specialist Please refer to [facility] for RD and/or RD on-call/weekend/after hours pager

## 2022-05-06 NOTE — TOC Progression Note (Signed)
Transition of Care [facility]) - Progression Note    Patient Details  Name: Laurie Lin MRN: [id number] Date of Birth: 1955/09/07  Transition of Care [facility]) CM/SW [place], RN Phone Number: 05/06/2022, 8:53 AM  Clinical Narrative:   Patient to discharge to [facility] room 611B I contacted the Husband Laurie Lin  to Notify of the [place] [facility] to transport I called EMS to arrange Transport  She is 4th on list      Expected Discharge Plan: [place] Barriers to Discharge: Continued Medical Work up  Expected Discharge Plan and [place] arrangements for the past 2 months: Single Family Home Expected Discharge Date: 05/05/22                                     Social Determinants of Health (SDOH) Interventions SDOH Screenings   Food Insecurity: No Food Insecurity (04/19/2022)  Housing: Low Risk  (04/19/2022)  Transportation Needs: No Transportation Needs (04/19/2022)  Utilities: Not At Risk (04/19/2022)  Tobacco Use: Low Risk  (04/29/2022)    Readmission Risk Interventions     No data to display

## 2022-05-06 NOTE — Assessment & Plan Note (Signed)
Mentation at baseline at this time.  - Continue home folic acid, thiamine and B12 supplementation

## 2022-05-06 NOTE — Progress Notes (Signed)
Pt was given all morning scheduled medication. Pt was given a CHG bath, IV removed from RUA. Foley care completed. Pt noted to have loose stools. Pts bottom was excoriated from loose BM's and MD notified and assessed at bedside. Disposable gown and brief placed on pt for transport. I called report to [phone] and gave report to the oncoming nurse Lonzo Cloud, RN. Reported husband [name] notified of transfer. Pt sent with all belongings.

## 2022-05-06 NOTE — Progress Notes (Signed)
Patient was discharged to SNF yesterday.  Unable to go due to's some delay from SNF arranging her tube feeds.  Patient is stable and will be leaving today. Discharge summary was updated for date as required by the SNF.

## 2022-05-06 NOTE — Assessment & Plan Note (Signed)
Per review, patient has a history of CKD stage IIIb - IV in the setting of type 1 diabetes.  Creatinine today is elevated at 1.79, however was 1.71 several days ago.  Difficult to say if this is AKI, or expected fluctuation.  -Gentle hydration - Hold home nephrotoxic agents to see if there is any improvement - Repeat BMP in the a.m.

## 2022-05-06 NOTE — ED Notes (Signed)
Daughter at bedside.

## 2022-05-06 NOTE — Assessment & Plan Note (Signed)
Per chart review, patient has a history of macrocytic anemia dating back to 2018 with most recent hemoglobin ranging between 9-10.  Likely multifactorial in the setting of B12 deficiency and bone marrow suppression in the setting of active alcohol use disorder.  Hemoglobin today stable despite concern for GI bleed.  - CBC every 8 hours - Transfuse for hemoglobin less than 7 or if hemodynamically compromised

## 2022-05-06 NOTE — ED Provider Notes (Signed)
[facility] Provider Note    None    (approximate)   History   Hematemesis and Rectal Bleeding   HPI  Laurie Lin is a 67 y.o. female  with h/o DM1, stage IIIa CKD, HTN, ETOH abuse s/p recent admission for encephalopathy, alcohol w/d complicated by Wernicke-Korsakoff, requiring  PEG tube placement and SNF placement, here with hematochezia and coffee-ground emesis. History limited 2/2 confusion. Per report, pt was just discharged and had been at the facility for 3 hr before he had bright red blood per rectum and coffee-ground emesis. Unable to provide history here. Pt is unsure why she is here, oriented to person only.  Level 5 caveat invoked as remainder of history, ROS, and physical exam limited due to patient's AMS      Physical Exam   Triage Vital Signs: ED Triage Vitals  Enc Vitals Group     BP 05/06/22 1436 137/67     Pulse Rate 05/06/22 1436 99     Resp 05/06/22 1436 16     Temp 05/06/22 1436 97.7 F (36.5 C)     Temp Source 05/06/22 1436 Oral     SpO2 05/06/22 1436 100 %     Weight --      Height --      Head Circumference --      [facility] --      Pain Score 05/06/22 1438 0     Pain Loc --      Pain Edu? --      Excl. in [place]? --     Most recent vital signs: Vitals:   05/06/22 2125 05/06/22 2154  BP: (!) 135/56 (!) 130/59  Pulse: 88 98  Resp: 17 16  Temp:  98.6 F (37 C)  SpO2: 100% 100%     General: Awake, no distress.  CV:  Good peripheral perfusion. RRR. No murmurs, rubs, gallops. Resp:  Normal effort. Lungs clear. Abd:  No distention. No tenderness. Other:  PEG tube in place with small amount of coffee ground material in tubing. Bright red blood noted dried around rectum.   ED Results / Procedures / Treatments   Labs (all labs ordered are listed, but only abnormal results are displayed) Labs Reviewed  COMPREHENSIVE METABOLIC PANEL - Abnormal; Notable for the following components:      Result Value   Potassium  5.3 (*)    Glucose, Bld 187 (*)    BUN 32 (*)    Creatinine, Ser 1.79 (*)    Calcium 8.8 (*)    Albumin 2.5 (*)    GFR, Estimated 31 (*)    All other components within normal limits  CBC - Abnormal; Notable for the following components:   WBC 13.3 (*)    RBC 3.04 (*)    Hemoglobin 9.6 (*)    HCT 30.7 (*)    MCV 101.0 (*)    Platelets 779 (*)    All other components within normal limits  LIPASE, BLOOD - Abnormal; Notable for the following components:   Lipase 70 (*)    All other components within normal limits  CBC - Abnormal; Notable for the following components:   WBC 13.0 (*)    RBC 2.81 (*)    Hemoglobin 8.9 (*)    HCT 28.2 (*)    MCV 100.4 (*)    Platelets 758 (*)    All other components within normal limits  GLUCOSE, CAPILLARY - Abnormal; Notable for the following components:  Glucose-Capillary 192 (*)    All other components within normal limits  CBG MONITORING, ED - Abnormal; Notable for the following components:   Glucose-Capillary 194 (*)    All other components within normal limits  PROTIME-INR  CBC  BASIC METABOLIC PANEL  POC OCCULT BLOOD, ED  TYPE AND SCREEN     EKG    RADIOLOGY CT A/P: Small to moderate bilateral pleural effusions with atelectasis in lower lobes   I also independently reviewed and agree with radiologist interpretations.   PROCEDURES:  Critical Care performed: No    MEDICATIONS ORDERED IN ED: Medications  pantoprazole (PROTONIX) injection 40 mg (40 mg Intravenous Given 05/07/22 0032)  sodium chloride flush (NS) 0.9 % injection 3 mL (3 mLs Intravenous Given 05/07/22 0033)  acetaminophen (TYLENOL) 160 MG/5ML solution 650 mg (has no administration in time range)  polyethylene glycol (MIRALAX / GLYCOLAX) packet 17 g (has no administration in time range)  folic acid (FOLVITE) tablet 1 mg (1 mg Per Tube Given 05/06/22 2248)  thiamine (VITAMIN B1) tablet 100 mg (100 mg Per Tube Given 05/06/22 2247)  multivitamin with minerals tablet  1 tablet (1 tablet Per Tube Given 05/06/22 2247)  insulin aspart (novoLOG) injection 0-9 Units (2 Units Subcutaneous Given 05/07/22 0112)  cyanocobalamin (VITAMIN B12) tablet 1,000 mcg (1,000 mcg Per Tube Given 05/06/22 2248)  hydrOXYzine (ATARAX) tablet 10 mg (has no administration in time range)  metoCLOPramide (REGLAN) 10 MG/10ML solution 5 mg (has no administration in time range)  Netarsudil Dimesylate 0.02 % SOLN 1 drop (has no administration in time range)  loperamide HCl (IMODIUM) 1 MG/7.5ML suspension 2 mg (has no administration in time range)  magnesium hydroxide (MILK OF MAGNESIA) suspension 30 mL (has no administration in time range)  sodium zirconium cyclosilicate (LOKELMA) packet 10 g (has no administration in time range)  pantoprazole (PROTONIX) 80 mg /NS 100 mL IVPB (0 mg Intravenous Stopped 05/06/22 1652)     IMPRESSION / MDM / ASSESSMENT AND PLAN / ED COURSE  I reviewed the triage vital signs and the nursing notes.                              Differential diagnosis includes, but is not limited to, stress gastritis, PUD, bleeding from recent PEG tube placement, LGIB, diverticulosis, AVM.  Patient's presentation is most consistent with acute presentation with potential threat to life or bodily function.  The patient is on the cardiac monitor to evaluate for evidence of arrhythmia and/or significant heart rate changes.  67 yo F s/p recent admission for Wernicke's encephalopathy, Korsakoff's here with GIB. Pt just discharged from [facility] - reviewed d/c summary. She had a PEG tube placed during admission. Pt has coffee-ground output from PEG tube and BRBPR, but is HDS. CT scan shows no significant abnormality/complications. Hgb 9.6. Mild leukocytosis noted. CMP with BUN 32, Cr 1.79 - consistent with slight dehydration/prerenal status. Will admit for IVF, monitoring of ongoing UGIB. Protonix started.   FINAL CLINICAL IMPRESSION(S) / ED DIAGNOSES   Final diagnoses:  Acute GI bleeding      Rx / DC Orders   ED Discharge Orders     None        Note:  This document was prepared using Dragon voice recognition software and may include unintentional dictation errors.   Duffy Bruce, MD 05/07/22 [phone]

## 2022-05-06 NOTE — H&P (Signed)
History and Physical    Patient: Laurie Lin QPR:[id number] DOB: 1955-05-21 DOA: 05/06/2022 DOS: the patient was seen and examined on 05/06/2022 PCP: Pcp, No  Patient coming from: Home  Chief Complaint:  Chief Complaint  Patient presents with   Hematemesis   Rectal Bleeding   HPI: Laurie Lin is a 67 y.o. female with medical history significant of recently diagnosed Wernicke-Korsakoff syndrome with prolonged hospitalization (04/17/2022 - 05/06/2022), type 1 diabetes, hypertension, CKD stage IIIa, alcohol use disorder who presents to the ED with complaints of coffee-ground emesis and hematochezia.  History obtained through chart review and from patient's husband at bedside, as patient is altered at baseline.  Per chart review, patient was discharged from the hospital earlier today after a prolonged hospitalization for Wernicke-Korsakoff syndrome complicated by Klebsiella UTI, encephalopathy requiring Precedex, and now with PEG and foley dependence.   Per patient's husband, when she arrived at her facility this afternoon, she had a large bout of coffee-ground emesis.  When she was being cleaned, they noticed that she had bright red blood per rectum.  No other symptoms noticed at the time.  Patient unable to provide any additional history at this time.  ED course: On arrival to the ED, patient was normotensive at 136/65 with heart rate of 94.  She was saturating at 100% on room air and afebrile at 97.7.  Initial workup remarkable for WBC of 13.3, hemoglobin of 9.6, platelets of 779, potassium of 5.3, creatinine 1.79, BUN of 32, albumin of 2.5 and GFR of 31.   INR within normal limits at 1.0.  CT abdomen/pelvis was obtained that did not show any acute abnormalities of the abdomen or pelvis.  TRH contacted for admission for acute GI bleed.  Review of Systems: unable to review all systems due to the inability of the patient to answer questions. Past Medical History:  Diagnosis Date    Diabetes mellitus without complication (HCC)    GERD (gastroesophageal reflux disease)    Hypertension    Renal disorder    stage 3    Past Surgical History:  Procedure Laterality Date   DILATION AND CURETTAGE OF UTERUS     Miscarriage   EYE SURGERY     bilateral caterac   FINGER SURGERY     IR GASTROSTOMY TUBE MOD SED  04/29/2022   KNEE ARTHROSCOPY Left 1989   [facility]   PILONIDAL CYST DRAINAGE     Social History:  reports that she has never smoked. She has never used smokeless tobacco. She reports current alcohol use of about 6.0 standard drinks of alcohol per week. She reports that she does not use drugs.  Allergies  Allergen Reactions   Ibuprofen Nausea Only   Nsaids     Ulcerative stomach and small intestines   Brimonidine Itching    Red, itchy, sensitivity to light Red, itchy, sensitivity to light    Latex Itching    Other reaction(s): Other (see comments)    Family History  Problem Relation Age of Onset   Diabetes Mother     Prior to Admission medications   Medication Sig Start Date End Date Taking? Authorizing Provider  acetaminophen (TYLENOL) 500 MG tablet Take 500 mg by mouth 2 (two) times daily as needed.     [provider]  Continuous Blood Gluc Sensor (DEXCOM G6 SENSOR) MISC by Does not apply route. 11/10/18   [provider]  cyanocobalamin 1000 MCG tablet Place 1 tablet (1,000 mcg total) into feeding tube daily. 05/06/22  Lorella Nimrod, MD  esomeprazole (NEXIUM) 40 MG capsule TAKE 1 CAPSULE (40 MG TOTAL) BY MOUTH 2 (TWO) TIMES DAILY BEFORE A MEAL. 08/13/19   Malfi, Lupita Raider, FNP  folic acid (FOLVITE) 1 MG tablet Place 1 tablet (1 mg total) into feeding tube daily. 05/05/22 05/05/23  Lorella Nimrod, MD  hydrOXYzine (ATARAX) 10 MG tablet Place 1 tablet (10 mg total) into feeding tube 3 (three) times daily as needed for itching. 05/05/22   Lorella Nimrod, MD  insulin aspart (NOVOLOG) 100 UNIT/ML injection Inject 0-9 Units into the skin 3 (three) times  daily with meals. BS 70-120 - 0 units       121-150 - 1 units       151-200 - 2 units        201-250 - 3 units        251-300 - 4 units        301-350 - 5 units        351-400 - 6 units        > 400 call md. 03/27/17   Henreitta Leber, MD  insulin aspart (NOVOLOG) 100 UNIT/ML injection Inject 5 Units into the skin 3 (three) times daily with meals. 05/06/22   Lorella Nimrod, MD  Insulin Glargine (BASAGLAR KWIKPEN) 100 UNIT/ML SOPN Inject 12 Units into the skin at bedtime.  07/01/18   [provider]  Insulin Pen Needle 32G X 4 MM MISC ok to sub any brand or size needle preferred by insurance/patient, use up to 5x/day, dx E10.65 03/02/18   [provider]  lisinopril (ZESTRIL) 2.5 MG tablet Take 1 tablet (2.5 mg total) by mouth daily. 12/02/18   Mikey College, NP  loperamide HCl (IMODIUM) 1 MG/7.5ML suspension Place 15 mLs (2 mg total) into feeding tube as needed for diarrhea or loose stools. 05/06/22   Lorella Nimrod, MD  magnesium hydroxide (MILK OF MAGNESIA) 400 MG/5ML suspension Place 30 mLs into feeding tube daily as needed for mild constipation. 05/05/22   Lorella Nimrod, MD  metoCLOPramide (REGLAN) 5 MG/5ML solution Place 5 mLs (5 mg total) into feeding tube 3 (three) times daily before meals. 05/05/22   Lorella Nimrod, MD  Multiple Vitamin (MULTIVITAMIN WITH MINERALS) TABS tablet Take 1 tablet by mouth daily. 03/28/17   Henreitta Leber, MD  Netarsudil Dimesylate (RHOPRESSA) 0.02 % SOLN Place 1 drop into both eyes every evening.    [provider]  Nutritional Supplements (FEEDING SUPPLEMENT, OSMOLITE 1.5 CAL,) LIQD Place 237 mLs into feeding tube 6 (six) times daily. 05/06/22   Lorella Nimrod, MD  [facility] DELICA LANCETS 56O MISC 1 Device by Does not apply route 4 (four) times daily. 05/20/17   Mikey College, NP  Protein (FEEDING SUPPLEMENT, PROSOURCE TF20,) liquid Place 60 mLs into feeding tube daily. 05/06/22   Lorella Nimrod, MD  thiamine (VITAMIN B1) 100 MG  tablet Place 1 tablet (100 mg total) into feeding tube daily. 05/05/22   Lorella Nimrod, MD  tiZANidine (ZANAFLEX) 4 MG capsule Take 1 capsule (4 mg total) by mouth 3 (three) times daily as needed for muscle spasms. 10/28/19   Katy Apo, NP  torsemide (DEMADEX) 20 MG tablet Take 1 tablet (20 mg total) by mouth daily as needed (Lower extremity edema). 05/05/22   Lorella Nimrod, MD  Travoprost, BAK Free, (TRAVATAN) 0.004 % SOLN ophthalmic solution Place 1 drop into both eyes at bedtime.    [provider]  Water For Irrigation, Sterile (FREE WATER) SOLN Place 140  mLs into feeding tube 6 (six) times daily. 05/06/22   Lorella Nimrod, MD  sertraline (ZOLOFT) 50 MG tablet TAKE 1 TABLET BY MOUTH EVERY DAY 12/13/18 10/28/19  Mikey College, NP    Physical Exam: Vitals:   05/06/22 1700 05/06/22 1703 05/06/22 1730 05/06/22 1930  BP: (!) 150/70  (!) 149/67 132/60  Pulse: 94  64 99  Resp: (!) 26  19 (!) 24  Temp:  98.7 F (37.1 C)    TempSrc:  Axillary    SpO2: 99%  (!) 84% 100%   Physical Exam Vitals and nursing note reviewed.  Constitutional:      General: She is not in acute distress.    Appearance: She is not toxic-appearing.  HENT:     Head: Normocephalic and atraumatic.     Mouth/Throat:     Mouth: Mucous membranes are dry.     Pharynx: Oropharynx is clear.  Eyes:     Conjunctiva/sclera: Conjunctivae normal.     Pupils: Pupils are equal, round, and reactive to light.  Cardiovascular:     Rate and Rhythm: Normal rate and regular rhythm.     Heart sounds: No murmur heard.    No gallop.  Pulmonary:     Effort: Pulmonary effort is normal. No respiratory distress.     Breath sounds: Rales (Bibasilar) present. No wheezing or rhonchi.  Abdominal:     General: Bowel sounds are normal. There is no distension.     Palpations: Abdomen is soft.     Tenderness: There is no abdominal tenderness. There is no guarding.     Comments: PEG tube in right upper quadrant with some dark red  specks in the tube  Musculoskeletal:     Right lower leg: No edema.     Left lower leg: No edema.  Skin:    General: Skin is warm and dry.  Neurological:     Mental Status: She is alert.     Comments:  Patient is alert but disoriented and at her baseline. No focal weakness noted  Psychiatric:        Mood and Affect: Affect is blunt.    Data Reviewed: CBC with WBC of 13.3, hemoglobin of 9.6, MCV of 101, platelets of 779. CMP with potassium of 5.3, sodium of 139, bicarb 22, glucose 187, BUN 32, creatinine 1.79, calcium 8.8, albumin 2.5, lipase 70, AST 15, ALT 7 and GFR 31. INR within normal limits at 1.0  CT ABDOMEN PELVIS WO CONTRAST  Result Date: 05/06/2022 CLINICAL DATA:  Abdominal pain, acute, nonlocalized EXAM: CT ABDOMEN AND PELVIS WITHOUT CONTRAST TECHNIQUE: Multidetector CT imaging of the abdomen and pelvis was performed following the standard protocol without IV contrast. RADIATION DOSE REDUCTION: This exam was performed according to the departmental dose-optimization program which includes automated exposure control, adjustment of the mA and/or kV according to patient size and/or use of iterative reconstruction technique. COMPARISON:  04/17/2022 FINDINGS: Lower chest: Small to moderate bilateral pleural effusions. Compressive atelectasis in the lower lobes. Hepatobiliary: No focal hepatic abnormality. Gallbladder unremarkable. Pancreas: No focal abnormality or ductal dilatation. Spleen: No focal abnormality.  Normal size. Adrenals/Urinary Tract: No adrenal abnormality. No focal renal abnormality. No stones or hydronephrosis. Urinary bladder decompressed with Foley catheter in place. Stomach/Bowel: Gastrostomy tube in the stomach. Stomach, large and small bowel grossly unremarkable. Vascular/Lymphatic: No evidence of aneurysm or adenopathy. Reproductive: Uterus and adnexa unremarkable.  No mass. Other: No free fluid or free air. Musculoskeletal: No acute bony abnormality. IMPRESSION:  Small  to moderate bilateral pleural effusions with compressive atelectasis in the lower lobes. No acute findings in the abdomen or pelvis. Electronically Signed   By: Rolm Baptise M.D.   On: 05/06/2022 18:10    There are no new results to review at this time.  Assessment and Plan:  * Acute GI bleeding Patient presenting with sudden onset coffee-ground emesis with bright red blood per rectum.  Coffee-ground emesis was witnessed by EMS and bright red blood was witnessed by SNF staff.  With this history, difficult to discern if this is an upper or lower bleed or combination of both.  Hemodynamically stable right now with stable hemoglobin.  - GI consulted; appreciate their recommendations - N.p.o. - Hold tube feeds - Protonix 40 mg IV twice daily - If any hemodynamic compromise should occur or sharp decrease in hemoglobin, plan to obtain a CTA  Anemia Per chart review, patient has a history of macrocytic anemia dating back to 2018 with most recent hemoglobin ranging between 9-10.  Likely multifactorial in the setting of B12 deficiency and bone marrow suppression in the setting of active alcohol use disorder.  Hemoglobin today stable despite concern for GI bleed.  - CBC every 8 hours - Transfuse for hemoglobin less than 7 or if hemodynamically compromised  Chronic kidney disease, stage 3 unspecified ([place]) Per review, patient has a history of CKD stage IIIb - IV in the setting of type 1 diabetes.  Creatinine today is elevated at 1.79, however was 1.71 several days ago.  Difficult to say if this is AKI, or expected fluctuation.  -Gentle hydration - Hold home nephrotoxic agents to see if there is any improvement - Repeat BMP in the a.m.    Type 1 diabetes mellitus with hyperglycemia (HCC) - Holding home tube feeds in the setting of GI bleed - SSI, sensitive every 4 hours while n.p.o.  Wernicke-Korsakoff syndrome ([place]) Mentation at baseline at this time.  - Continue home folic acid,  thiamine and B12 supplementation  Advance Care Planning:   Code Status: Full Code.  Verified by patient's husband at bedside  Consults: GI  Family Communication: Patient's husband updated at bedside  Severity of Illness: The appropriate patient status for this patient is INPATIENT. Inpatient status is judged to be reasonable and necessary in order to provide the required intensity of service to ensure the patient's safety. The patient's presenting symptoms, physical exam findings, and initial radiographic and laboratory data in the context of their chronic comorbidities is felt to place them at high risk for further clinical deterioration. Furthermore, it is not anticipated that the patient will be medically stable for discharge from the hospital within 2 midnights of admission.   * I certify that at the point of admission it is my clinical judgment that the patient will require inpatient hospital care spanning beyond 2 midnights from the point of admission due to high intensity of service, high risk for further deterioration and high frequency of surveillance required.*  Author: Jose Persia, MD 05/06/2022 8:31 PM  For on call review [email or web address].

## 2022-05-07 DIAGNOSIS — D649 Anemia, unspecified: Secondary | ICD-10-CM | POA: Diagnosis not present

## 2022-05-07 DIAGNOSIS — K922 Gastrointestinal hemorrhage, unspecified: Secondary | ICD-10-CM | POA: Diagnosis not present

## 2022-05-07 LAB — BASIC METABOLIC PANEL
Anion gap: 10 (ref 5–15)
BUN: 29 mg/dL — ABNORMAL HIGH (ref 8–23)
CO2: 19 mmol/L — ABNORMAL LOW (ref 22–32)
Calcium: 8.8 mg/dL — ABNORMAL LOW (ref 8.9–10.3)
Chloride: 110 mmol/L (ref 98–111)
Creatinine, Ser: 1.75 mg/dL — ABNORMAL HIGH (ref 0.44–1.00)
GFR, Estimated: 32 mL/min — ABNORMAL LOW (ref >60)
Glucose, Bld: 121 mg/dL — ABNORMAL HIGH (ref 70–99)
Potassium: 4.8 mmol/L (ref 3.5–5.1)
Sodium: 139 mmol/L (ref 135–145)

## 2022-05-07 LAB — CBC
HCT: 28.4 % — ABNORMAL LOW (ref 36.0–46.0)
Hemoglobin: 9 g/dL — ABNORMAL LOW (ref 12.0–15.0)
MCH: 31.7 pg (ref 26.0–34.0)
MCHC: 31.7 g/dL (ref 30.0–36.0)
MCV: 100 fL (ref 80.0–100.0)
Platelets: 717 10*3/uL — ABNORMAL HIGH (ref 150–400)
RBC: 2.84 MIL/uL — ABNORMAL LOW (ref 3.87–5.11)
RDW: 13.1 % (ref 11.5–15.5)
WBC: 11.1 10*3/uL — ABNORMAL HIGH (ref 4.0–10.5)
nRBC: 0 % (ref 0.0–0.2)

## 2022-05-07 LAB — GLUCOSE, CAPILLARY
Glucose-Capillary: 120 mg/dL — ABNORMAL HIGH (ref 70–99)
Glucose-Capillary: 153 mg/dL — ABNORMAL HIGH (ref 70–99)
Glucose-Capillary: 107 mg/dL — ABNORMAL HIGH (ref 70–99)
Glucose-Capillary: 131 mg/dL — ABNORMAL HIGH (ref 70–99)
Glucose-Capillary: 211 mg/dL — ABNORMAL HIGH (ref 70–99)
Glucose-Capillary: 172 mg/dL — ABNORMAL HIGH (ref 70–99)

## 2022-05-07 MED ORDER — OSMOLITE 1.5 CAL PO LIQD
120.0000 mL | Freq: Every day | ORAL | Status: AC
  Administered 2022-05-07 (×2): 120 mL

## 2022-05-07 MED ORDER — VITAMIN D 25 MCG (1000 UNIT) PO TABS
2000.0000 [IU] | ORAL_TABLET | Freq: Every day | ORAL | Status: DC
  Administered 2022-05-08 – 2022-05-12 (×5): 2000 [IU]
  Filled 2022-05-07 (×5): qty 2

## 2022-05-07 MED ORDER — SODIUM ZIRCONIUM CYCLOSILICATE 10 G PO PACK
10.0000 g | PACK | Freq: Once | ORAL | Status: AC
  Administered 2022-05-07: 10 g
  Filled 2022-05-07: qty 1

## 2022-05-07 MED ORDER — TRAVOPROST (BAK FREE) 0.004 % OP SOLN
1.0000 [drp] | Freq: Every day | OPHTHALMIC | Status: DC
  Administered 2022-05-07 – 2022-05-11 (×5): 1 [drp] via OPHTHALMIC
  Filled 2022-05-07: qty 2.5

## 2022-05-07 MED ORDER — OSMOLITE 1.5 CAL PO LIQD
237.0000 mL | Freq: Every day | ORAL | Status: DC
  Administered 2022-05-08 – 2022-05-12 (×25): 237 mL

## 2022-05-07 MED ORDER — CHLORHEXIDINE GLUCONATE CLOTH 2 % EX PADS
6.0000 | MEDICATED_PAD | Freq: Every day | CUTANEOUS | Status: DC
  Administered 2022-05-07 – 2022-05-12 (×6): 6 via TOPICAL

## 2022-05-07 MED ORDER — FREE WATER
100.0000 mL | Freq: Every day | Status: DC
  Administered 2022-05-07 – 2022-05-09 (×10): 100 mL

## 2022-05-07 NOTE — TOC Initial Note (Addendum)
Transition of Care [facility]) - Initial/Assessment Note    Patient Details  Name: Laurie Lin MRN: [id number] Date of Birth: [birth year]  Transition of Care [facility]) CM/SW Contact:    Laurie Chroman, LCSW Phone Number: 05/07/2022, 9:52 AM  Clinical Narrative:    Per chart review, patient was discharged to [facility] yesterday morning and returned to the ED yesterday afternoon. CSW will follow for return to facility. Asked MD for PT and OT consults when appropriate.   TOC coworker completed readmission prevention screen on 1/5: "At baseline, patient lives with spouse who provides transportation. Patients PCP recently stopped practicing so Laurie Lin asked for resources, TOC to provide at DC. [facility] [place]. No DME at home."     12:40 pm: Called husband and confirmed plan to return to [facility] at discharge.           Expected Discharge Plan: Skilled Nursing Facility Barriers to Discharge: Continued Medical Work up   Patient Goals and CMS Choice Patient states their goals for this hospitalization and ongoing recovery are:: Patient not fully oriented.          Expected Discharge Plan and Services     Post Acute Care Choice: Resumption of Svcs/PTA Provider Living arrangements for the past 2 months: Single Family Home                                      Prior Living Arrangements/Services Living arrangements for the past 2 months: Single Family Home Lives with:: Spouse Patient language and need for interpreter reviewed:: Yes Do you feel safe going back to the place where you live?: Yes      Need for Family Participation in Patient Care: Yes (Comment) Care giver support system in place?: Yes (comment)   Criminal Activity/Legal Involvement Pertinent to Current Situation/Hospitalization: No - Comment as needed  Activities of Daily Living      Permission Sought/Granted Permission sought to share information with : Facility Investment banker, operational granted to share info w AGENCY: [facility]        Emotional Assessment Appearance:: Appears stated age Attitude/Demeanor/Rapport: Unable to Assess Affect (typically observed): Unable to Assess Orientation: : Oriented to Self Alcohol / Substance Use: Alcohol Use Psych Involvement: No (comment)  Admission diagnosis:  Acute GI bleeding [K92.2] Patient Active Problem List   Diagnosis Date Noted   Acute GI bleeding 05/06/2022   Aspiration pneumonia ([place]) 04/25/2022   Overweight (BMI 25.0-29.9) 04/19/2022   Encephalopathy acute 04/18/2022   Alcohol abuse 04/18/2022   GERD without esophagitis 04/18/2022   Acute renal failure superimposed on stage 3a chronic kidney disease ([place]) 04/18/2022   Wernicke-Korsakoff syndrome ([place]) 04/18/2022   Trochanteric bursitis of left hip 01/17/2020   Anemia in chronic kidney disease 11/03/2019   Chronic kidney disease, stage 3 unspecified ([place]) 11/03/2019   Burn of second degree of left foot, initial encounter 12/28/2018   Neurogenic pain 09/30/2017   Anemia 09/30/2017   Hyponatremia 09/30/2017   Hypocalcemia 09/30/2017   Hypoalbuminemia 09/30/2017   Diabetes mellitus with stage 4 chronic kidney disease GFR 15-29 ([place]) 09/30/2017   Diabetic peripheral neuropathy ([place]) 09/30/2017   Chronic pain of hip (B) 09/30/2017   Chronic sacroiliac joint pain (B) 09/30/2017   Lumbar facet joint syndrome 09/30/2017   H/O diabetic foot ulcer 09/09/2017   Chronic low back pain (Primary Area  of Pain) (Bilateral) (R>L) w/ sciatica (Bilateral) 09/09/2017   Chronic lower extremity pain (Secondary Area of Pain) (Bilateral) (R>L) 09/09/2017   Chronic pain syndrome 09/09/2017   Long term current use of opiate analgesic 09/09/2017   Pharmacologic therapy 09/09/2017   Disorder of skeletal system 09/09/2017   Problems influencing health status 09/09/2017   Other cirrhosis of liver ([place]) 06/03/2017   Chronic lumbar radiculopathy 06/03/2017   Chronic  neck pain 06/03/2017   Type 1 diabetes mellitus with hyperglycemia ([place]) 04/30/2017   Atrial flutter ([place]) 04/30/2017   Neuropathy 04/30/2017   Hiatal hernia 04/30/2017   Cataracts, bilateral 04/30/2017   Albuminuria 04/30/2017   DKA (diabetic ketoacidoses) 03/21/2017   Reactive depression 01/03/2015   Essential hypertension 09/28/2014   Hepatic steatosis 08/17/2013   Elevated liver enzymes 08/17/2013   Abnormal levels of other serum enzymes 08/17/2013   PCP:  Pcp, No Pharmacy:   [id number] [place], [address] [place] [address] Phone: [phone] Fax: [phone]     Social Determinants of Health (SDOH) Social History: Laurie Lin: No Food Insecurity (04/19/2022)  Housing: Low Risk  (04/19/2022)  Transportation Needs: No Transportation Needs (04/19/2022)  Utilities: Not At Risk (04/19/2022)  Tobacco Use: Low Risk  (04/29/2022)   SDOH Interventions:     Readmission Risk Interventions    05/07/2022    9:48 AM  Readmission Risk Prevention Plan  Transportation Screening Complete  PCP or Specialist Appt within 3-5 Days Complete  Social Work Consult for [place] Planning/Counseling Complete  Palliative Care Screening Not Applicable  Medication Review Press photographer) Complete

## 2022-05-07 NOTE — Consult Note (Signed)
GI Inpatient Consult Note  Reason for Consult: GI Bleed   Attending Requesting Consult: Dr. Jose Persia, MD  History of Present Illness: Laurie Lin is a 67 y.o. female seen for evaluation of GI bleed at the request of admitting hospitalist - Dr. Jose Persia. Patient has a PMH of recently diagnosed Wernicke-Korsakoff syndrome with prolonged hospitalization (12/28 - 05/06/22), T1DM, HTN, CKD Stage IIIa, and alcohol use disorder presented to the [facility] ED for chief complaint of coffee-ground emesis and hematochezia. She was discharged from hospital yesterday for Wernicke-Korsakoff syndrome c/b Klebsiella UTI, encephalopathy requiring Precedex, and now with PEG and foley dependence. Within three hours of being at her SNF after discharge from hospital yesterday she had witnessed episode of coffee-ground emesis and bright red blood per rectum and EMS was called to bring patient back to the ED. Upon presentation to the ED, all vital signs were within normal limits. Labs were significant for WBC 13.3K, hemoglobin 9.6, platelets 779, potassium 5.3, serum creatinine 1.79, BUN 32, albumin 2.5, GFR 31, and INR 1.0. CT abd/pelvis showed no acute intra-abdominal abnormalities. She was admitted to the hospital and GI was consulted for further evaluation and management.   Patient seen and examined resting comfortably in hospital bed. No acute events overnight. Patient is unable to tell me her name, location, birthday, current president of the [place], or why she is in the hospital. She is able to tell me that her husband's name is [name]. Per nursing report this afternoon, patient had small bowel movement this morning with no overt hematochezia or melanotic stool. There has been no episodes of vomiting. Patient unable to answer any of the history questions. She had PEG tube placed 1/9 last week during her extended hospital admission. No family at bedside at time of examination.    Past Medical  History:  Past Medical History:  Diagnosis Date   Diabetes mellitus without complication ([place])    GERD (gastroesophageal reflux disease)    Hypertension    Renal disorder    stage 3     Problem List: Patient Active Problem List   Diagnosis Date Noted   Acute GI bleeding 05/06/2022   Aspiration pneumonia ([place]) 04/25/2022   Overweight (BMI 25.0-29.9) 04/19/2022   Encephalopathy acute 04/18/2022   Alcohol abuse 04/18/2022   GERD without esophagitis 04/18/2022   Acute renal failure superimposed on stage 3a chronic kidney disease ([place]) 04/18/2022   Wernicke-Korsakoff syndrome ([place]) 04/18/2022   Trochanteric bursitis of left hip 01/17/2020   Anemia in chronic kidney disease 11/03/2019   Chronic kidney disease, stage 3 unspecified ([place]) 11/03/2019   Burn of second degree of left foot, initial encounter 12/28/2018   Neurogenic pain 09/30/2017   Anemia 09/30/2017   Hyponatremia 09/30/2017   Hypocalcemia 09/30/2017   Hypoalbuminemia 09/30/2017   Diabetes mellitus with stage 4 chronic kidney disease GFR 15-29 ([place]) 09/30/2017   Diabetic peripheral neuropathy ([place]) 09/30/2017   Chronic pain of hip (B) 09/30/2017   Chronic sacroiliac joint pain (B) 09/30/2017   Lumbar facet joint syndrome 09/30/2017   H/O diabetic foot ulcer 09/09/2017   Chronic low back pain (Primary Area of Pain) (Bilateral) (R>L) w/ sciatica (Bilateral) 09/09/2017   Chronic lower extremity pain (Secondary Area of Pain) (Bilateral) (R>L) 09/09/2017   Chronic pain syndrome 09/09/2017   Long term current use of opiate analgesic 09/09/2017   Pharmacologic therapy 09/09/2017   Disorder of skeletal system 09/09/2017   Problems influencing health status 09/09/2017   Other cirrhosis of  liver ([place]) 06/03/2017   Chronic lumbar radiculopathy 06/03/2017   Chronic neck pain 06/03/2017   Type 1 diabetes mellitus with hyperglycemia ([place]) 04/30/2017   Atrial flutter ([place]) 04/30/2017   Neuropathy 04/30/2017   Hiatal hernia  04/30/2017   Cataracts, bilateral 04/30/2017   Albuminuria 04/30/2017   DKA (diabetic ketoacidoses) 03/21/2017   Reactive depression 01/03/2015   Essential hypertension 09/28/2014   Hepatic steatosis 08/17/2013   Elevated liver enzymes 08/17/2013   Abnormal levels of other serum enzymes 08/17/2013    Past Surgical History: Past Surgical History:  Procedure Laterality Date   DILATION AND CURETTAGE OF UTERUS     Miscarriage   EYE SURGERY     bilateral caterac   FINGER SURGERY     IR GASTROSTOMY TUBE MOD SED  04/29/2022   KNEE ARTHROSCOPY Left 1989   [facility]   PILONIDAL CYST DRAINAGE      Allergies: Allergies  Allergen Reactions   Ibuprofen Nausea Only   Nsaids     Ulcerative stomach and small intestines   Brimonidine Itching    Red, itchy, sensitivity to light Red, itchy, sensitivity to light    Latex Itching    Other reaction(s): Other (see comments)    Home Medications: Medications Prior to Admission  Medication Sig Dispense Refill Last Dose   acetaminophen (TYLENOL) 500 MG tablet Take 500 mg by mouth 2 (two) times daily as needed.    prn   cyanocobalamin 1000 MCG tablet Place 1 tablet (1,000 mcg total) into feeding tube daily.   Past Week   esomeprazole (NEXIUM) 40 MG capsule TAKE 1 CAPSULE (40 MG TOTAL) BY MOUTH 2 (TWO) TIMES DAILY BEFORE A MEAL. 180 capsule 0 Past Week   folic acid (FOLVITE) 1 MG tablet Place 1 tablet (1 mg total) into feeding tube daily.  3 Past Week   hydrOXYzine (ATARAX) 10 MG tablet Place 1 tablet (10 mg total) into feeding tube 3 (three) times daily as needed for itching. 30 tablet 0 Past Week   insulin aspart (NOVOLOG) 100 UNIT/ML injection Inject 0-9 Units into the skin 3 (three) times daily with meals. BS 70-120 - 0 units       121-150 - 1 units       151-200 - 2 units        201-250 - 3 units        251-300 - 4 units        301-350 - 5 units        351-400 - 6 units        > 400 call md. 10 mL 11 Past Week   insulin aspart (NOVOLOG) 100  UNIT/ML injection Inject 5 Units into the skin 3 (three) times daily with meals. 10 mL 11 Past Week   Insulin Glargine (BASAGLAR KWIKPEN) 100 UNIT/ML SOPN Inject 12 Units into the skin at bedtime.    Past Week   lisinopril (ZESTRIL) 2.5 MG tablet Take 1 tablet (2.5 mg total) by mouth daily. 30 tablet 5 Past Week   loperamide HCl (IMODIUM) 1 MG/7.5ML suspension Place 15 mLs (2 mg total) into feeding tube as needed for diarrhea or loose stools. 120 mL 0 prn   magnesium hydroxide (MILK OF MAGNESIA) 400 MG/5ML suspension Place 30 mLs into feeding tube daily as needed for mild constipation. 355 mL 0 prn   metoCLOPramide (REGLAN) 5 MG/5ML solution Place 5 mLs (5 mg total) into feeding tube 3 (three) times daily before meals. 120 mL 0 Past Week  Multiple Vitamin (MULTIVITAMIN WITH MINERALS) TABS tablet Take 1 tablet by mouth daily.   Past Week   Netarsudil Dimesylate (RHOPRESSA) 0.02 % SOLN Place 1 drop into both eyes every evening.   Past Week   thiamine (VITAMIN B1) 100 MG tablet Place 1 tablet (100 mg total) into feeding tube daily.   Past Week   tiZANidine (ZANAFLEX) 4 MG capsule Take 1 capsule (4 mg total) by mouth 3 (three) times daily as needed for muscle spasms. 20 capsule 0 prn   torsemide (DEMADEX) 20 MG tablet Take 1 tablet (20 mg total) by mouth daily as needed (Lower extremity edema).   prn   Travoprost, BAK Free, (TRAVATAN) 0.004 % SOLN ophthalmic solution Place 1 drop into both eyes at bedtime.   Past Week   Continuous Blood Gluc Sensor (DEXCOM G6 SENSOR) MISC by Does not apply route.      Insulin Pen Needle 32G X 4 MM MISC ok to sub any brand or size needle preferred by insurance/patient, use up to 5x/day, dx E10.65      Nutritional Supplements (FEEDING SUPPLEMENT, OSMOLITE 1.5 CAL,) LIQD Place 237 mLs into feeding tube 6 (six) times daily.  0    ONETOUCH DELICA LANCETS 40J MISC 1 Device by Does not apply route 4 (four) times daily. 100 each 11    Protein (FEEDING SUPPLEMENT, PROSOURCE  TF20,) liquid Place 60 mLs into feeding tube daily.      Water For Irrigation, Sterile (FREE WATER) SOLN Place 140 mLs into feeding tube 6 (six) times daily.      Home medication reconciliation was completed with the patient.   Scheduled Inpatient Medications:    Chlorhexidine Gluconate Cloth  6 each Topical Daily   cyanocobalamin  1,000 mcg Per Tube Daily   folic acid  1 mg Per Tube Daily   insulin aspart  0-9 Units Subcutaneous Q4H   metoCLOPramide  5 mg Per Tube TID AC   multivitamin with minerals  1 tablet Per Tube Daily   Netarsudil Dimesylate  1 drop Both Eyes QPM   pantoprazole (PROTONIX) IV  40 mg Intravenous Q12H   sodium chloride flush  3 mL Intravenous Q12H   thiamine  100 mg Per Tube Daily   PRN Inpatient Medications:  acetaminophen (TYLENOL) oral liquid 160 mg/5 mL, hydrOXYzine, loperamide HCl, magnesium hydroxide, polyethylene glycol  Family History: family history includes Diabetes in her mother.  The patient's family history is negative for inflammatory bowel disorders, GI malignancy, or solid organ transplantation.  Social History:   reports that she has never smoked. She has never used smokeless tobacco. She reports current alcohol use of about 6.0 standard drinks of alcohol per week. She reports that she does not use drugs. The patient denies ETOH, tobacco, or drug use.   Review of Systems:  Unable to obtain 2/2 patient's encephalopathy    Physical Examination: BP (!) 139/56 (BP Location: Right Arm)   Pulse 95   Temp 98.7 F (37.1 C)   Resp 17   LMP 09/09/2004 (Approximate)   SpO2 99%  Gen: NAD, alert but not oriented to person or place  HEENT: PEERLA, EOMI, Neck: supple, no JVD or thyromegaly Chest: Rales present in bilateral lower lung fields, no wheezing or rhonchi, no accessory muscle use CV: RRR, no m/g/c/r Abd: soft, NT, ND, +BS in all four quadrants; no HSM, guarding, ridigity, or rebound tenderness, +PEG tube in place in RUQ  Ext: no edema, well  perfused with 2+ pulses, Skin: no rash  or lesions noted Lymph: no LAD  Data: Lab Results  Component Value Date   WBC 11.1 (H) 05/07/2022   HGB 9.0 (L) 05/07/2022   HCT 28.4 (L) 05/07/2022   MCV 100.0 05/07/2022   PLT 717 (H) 05/07/2022   Recent Labs  Lab 05/06/22 1450 05/06/22 2216 05/07/22 0444  HGB 9.6* 8.9* 9.0*   Lab Results  Component Value Date   NA 139 05/07/2022   K 4.8 05/07/2022   CL 110 05/07/2022   CO2 19 (L) 05/07/2022   BUN 29 (H) 05/07/2022   CREATININE 1.75 (H) 05/07/2022   GLU 208 03/29/2019   Lab Results  Component Value Date   ALT 7 05/06/2022   AST 15 05/06/2022   ALKPHOS 58 05/06/2022   BILITOT 0.6 05/06/2022   Recent Labs  Lab 05/06/22 1504  INR 1.0   CT abd/pelvis without contrast 05/06/2022: IMPRESSION: Small to moderate bilateral pleural effusions with compressive atelectasis in the lower lobes.   No acute findings in the abdomen or pelvis.  Assessment/Plan:  67 y/o Caucasian female with a PMH of recently diagnosed Wernicke-Korsakoff syndrome with prolonged hospitalization (12/28 - 05/06/22), T1DM, HTN, CKD Stage IIIa, and alcohol use disorder presented to the [facility] ED for chief complaint of coffee-ground emesis and hematochezia  Coffee-ground emesis/Hematochezia - concerning for chronic GI bleed but unclear if UGI or LGI source. DDx includes peptic ulcer disease, gastritis +/- H pylori, erosive esophagitis, AVMs, Dieulafoy's lesion, GAVE, colon polyps, colon neoplasm, other occult GI malignancy, etc. H&H stable with no significant drop in hemoglobin compared to one week ago. No overt gastrointestinal bleeding.   Encephalopathy - presumed 2/2 Wernicke-Korsakoff syndrome related to alcohol use disorder. She is receiving folic acid, thiamine, and B12.   Macrocytic anemia - likely 2/2 bone marrow suppression in setting of EtOH abuse  Bilateral pleural effusions   CKD Stage IIIa  Type 1 DM  Alcohol use disorder    Recommendations:  - Maintain 2 large bore IVs for access - Continue to monitor serial H&H. Transfuse for Hgb <7.0.  - Continue Protonix IV BID for gastric protection  - Continue to monitor for signs of GI bleeding - No signs of overt gastrointestinal bleeding at this time  - Unclear if she has ever had an endoscopy or colonoscopy previously. I see no previous endoscopic procedure reports.  - Continue serial exams to monitor mental status - Suspect she has irreversible damage 2/2 WKS from chronic EtOH abuse - Continue vitamin supplementation  - No plans for endoscopic evaluation at this time - Continue supportive care per primary team - GI following along with you. Consider EGD/CSY when clinically feasible.  - I have attempted to call patient's husband, [name], x 2 this afternoon with no response  Thank you for the consult. Please call with questions or concerns.  Geanie Kenning, PA-C [facility] [phone]

## 2022-05-07 NOTE — Inpatient Diabetes Management (Signed)
Inpatient Diabetes Program Recommendations  AACE/ADA: New Consensus Statement on Inpatient Glycemic Control (2015)  Target Ranges:  Prepandial:   less than 140 mg/dL      Peak postprandial:   less than 180 mg/dL (1-2 hours)      Critically ill patients:  140 - 180 mg/dL   Lab Results  Component Value Date   GLUCAP 153 (H) 05/07/2022   HGBA1C 7.2 (H) 04/18/2022    Review of Glycemic Control  Diabetes history: DM1(does not make insulin.  Needs correction, basal and meal coverage) Outpatient Diabetes medications:  Basaglar 12 units QHS Novolog 0-9 units TID and 5 units TID with meals  Current orders for Inpatient glycemic control:  Novolog 0-9 units Q4H  Inpatient Diabetes Program Recommendations:    Semglee 10 units QHS (80% of home dose) Once eating Novolog 3 units TID with meals if she consumes at least 50%  Will continue to follow while inpatient.  Thank you, Reche Dixon, MSN, [place] [facility] Coordinator Inpatient Diabetes Program [phone] (team pager from 8a-5p)

## 2022-05-07 NOTE — Progress Notes (Signed)
Initial Nutrition Assessment  DOCUMENTATION CODES:   Not applicable  INTERVENTION:   Osmolite 1.5- Give 1 carton (243ml) six times daily via tube- Flush with 9ml of water before and after each feed.   Regimen provides 2130kcal/day, 90g/day protein and 1658ml of free water.   Pt at high refeed risk; recommend monitor potassium, magnesium and phosphorus labs daily until stable  Daily weights   Cholecalciferol 2000 units daily via tube   NUTRITION DIAGNOSIS:   Inadequate oral intake related to dysphagia as evidenced by NPO status.  GOAL:   Patient will meet greater than or equal to 90% of their needs  MONITOR:   Labs, Weight trends, TF tolerance, Skin, I & O's  REASON FOR ASSESSMENT:   Malnutrition Screening Tool    ASSESSMENT:   67 y/o female with h/o IDDM, HTN, etoh abuse, GERD, hital hernia, neuropathy, PUD, cirrhosis, varices, CKD III and recent admission for encephalopathy, UTI and possible Wernickes requiring IR G-tube 1/9 for AMS/dysphagia and just discharged 1/16 and who is now re-admitted with GIB.  Met with pt and family in room today. Pt is unable to provide history r/t AMS. Pt is well known to the nutrition department and this RD from her recent previous admission. Pt with G-tube discharged on bolus tube feeds to SNF. No further GI bleeding noted. Plan is to resume tube feeds today; will start with 1/2 carton for today and advance to goal rate tomorrow. Pt is at refeed risk. Pt NPO and does not take any food by mouth. Per chart, pt appears weight stable.   Of note, pt with folic acid and vitamin D deficiency noted last admission. Folic acid rechecked and is wnl. Will continue vitamin D supplementation.    Medications reviewed and include: P50, folic acid, insulin, reglan, MVI, protonix, thiamine   Labs reviewed: K 4.8 wnl, BUN 29(H), creat 1.75(H) Vitamin D 13.6(L)- 1/9 Folic acid- 93.2- 6/71 Thiamine 118.2 wnl- 04/18/22 Wbc- 11.1(H), Hgb 9.0(L), Hct  28.4(L) Cbgs- 107, 153, 120 x 24 hrs  AIC 7.2(H)- 04/18/22  NUTRITION - FOCUSED PHYSICAL EXAM:  Flowsheet Row Most Recent Value  Orbital Region No depletion  Upper Arm Region Mild depletion  Thoracic and Lumbar Region No depletion  Buccal Region No depletion  Temple Region No depletion  Clavicle Bone Region Mild depletion  Clavicle and Acromion Bone Region Mild depletion  Scapular Bone Region No depletion  Dorsal Hand No depletion  Patellar Region Mild depletion  Anterior Thigh Region Mild depletion  Posterior Calf Region Mild depletion  Edema (RD Assessment) None  Hair Reviewed  Eyes Reviewed  Mouth Reviewed  Skin Reviewed  Nails Reviewed   Diet Order:   Diet Order             Diet NPO time specified  Diet effective now                  EDUCATION NEEDS:   No education needs have been identified at this time  Skin:  Skin Assessment: Reviewed RN Assessment  Last BM:  1/17- type 7  Height:   Ht Readings from Last 1 Encounters:  04/29/22 5\' 10"  (1.778 m)    Weight:   Wt Readings from Last 1 Encounters:  05/07/22 87.7 kg    Ideal Body Weight:  75.45 kg  BMI:  Body mass index is 27.74 kg/m.  Estimated Nutritional Needs:   Kcal:  1900-2200kcal/day  Protein:  95-110g/day  Fluid:  1.7-2.0L/day  Koleen Distance MS, RD, LDN Please  refer to [facility] for RD and/or RD on-call/weekend/after hours pager

## 2022-05-07 NOTE — Plan of Care (Signed)
  Problem: Education: Goal: Ability to describe self-care measures that may prevent or decrease complications (Diabetes Survival Skills Education) will improve Outcome: Progressing Goal: Individualized Educational Video(s) Outcome: Progressing   Problem: Coping: Goal: Ability to adjust to condition or change in health will improve Outcome: Progressing   Problem: Fluid Volume: Goal: Ability to maintain a balanced intake and output will improve Outcome: Progressing   Problem: Health Behavior/Discharge Planning: Goal: Ability to identify and utilize available resources and services will improve Outcome: Progressing Goal: Ability to manage health-related needs will improve Outcome: Progressing   Problem: Metabolic: Goal: Ability to maintain appropriate glucose levels will improve Outcome: Progressing   Problem: Nutritional: Goal: Maintenance of adequate nutrition will improve Outcome: Progressing Goal: Progress toward achieving an optimal weight will improve Outcome: Progressing   Problem: Skin Integrity: Goal: Risk for impaired skin integrity will decrease Outcome: Progressing   Problem: Tissue Perfusion: Goal: Adequacy of tissue perfusion will improve Outcome: Progressing   Problem: Education: Goal: Knowledge of General Education information will improve Description: Including pain rating scale, medication(s)/side effects and non-pharmacologic comfort measures Outcome: Progressing   Problem: Health Behavior/Discharge Planning: Goal: Ability to manage health-related needs will improve Outcome: Progressing   Problem: Clinical Measurements: Goal: Ability to maintain clinical measurements within normal limits will improve Outcome: Progressing Goal: Will remain free from infection Outcome: Progressing   

## 2022-05-07 NOTE — Progress Notes (Addendum)
Progress Note   Patient: MAITLYN PENZA VFI:[id number] DOB: [birth year] DOA: 05/06/2022     1 DOS: the patient was seen and examined on 05/07/2022   Brief hospital course: 67 y.o. female with medical history significant of recently diagnosed Wernicke-Korsakoff syndrome with prolonged hospitalization (04/17/2022 - 05/06/2022), type 1 diabetes, hypertension, CKD stage IIIa, alcohol use disorder who presents to the ED with complaints of coffee-ground emesis and hematochezia.  History obtained through chart review and from patient's husband at bedside, as patient is altered at baseline.   Per chart review, patient was discharged from the hospital earlier today after a prolonged hospitalization for Wernicke-Korsakoff syndrome complicated by Klebsiella UTI, encephalopathy requiring Precedex, and now with PEG and foley dependence.    Per patient's husband, when she arrived at her facility this afternoon, she had a large bout of coffee-ground emesis.  When she was being cleaned, they noticed that she had bright red blood per rectum.  Patient was seen by GI who is considering EGD and colonoscopy once patient is more stable and family agrees  Assessment and Plan: Acute GI bleeding Patient presenting with sudden onset coffee-ground emesis with bright red blood per rectum.  Coffee-ground emesis was witnessed by EMS and bright red blood was witnessed by SNF staff.  With this history, difficult to discern if this is an upper or lower bleed or combination of both.  Hemodynamically stable right now with stable hemoglobin.   - GI consulted; recommendations for monitoring hemoglobin and vital signs closely and consider EGD and colonoscopy when clinically feasible and family agrees. Restart tube feedings - Protonix 40 mg IV twice daily - If any hemodynamic compromise should occur or sharp decrease in hemoglobin may review further evaluation by GI   Acute on chronic anemia due to blood loss Per chart review, patient  has a history of macrocytic anemia dating back to 2018 with most recent hemoglobin ranging between 9-10.  Likely multifactorial in the setting of B12 deficiency and bone marrow suppression in the setting of active alcohol use disorder.  Hemoglobin decreased from 10.4 to 9.0 likely due to concern for GI bleed.   - CBC every 8 hours - Transfuse for hemoglobin less than 7 or if hemodynamically compromised   Chronic kidney disease, stage 3 unspecified ([place]) Per review, patient has a history of CKD stage IIIb - IV in the setting of type 1 diabetes.  Creatinine  is elevated at 1.75 on 05/07/2022 however was 1.81 several days ago.  Not sure if this is AKI   -Gentle hydration - Hold home nephrotoxic agents to see if there is any improvement - Repeat BMP in the a.m.       Type 1 diabetes mellitus with hyperglycemia (HCC) - restart tube feeds per GI - SSI,    Wernicke-Korsakoff syndrome ([place]) Mentation at baseline at this time.   - Continue home folic acid, thiamine and B12 supplementation   Advance Care Planning:   Code Status: Full Code.  Verified by patient's husband at bedside  Consultants:GI     Subjective: Seen at the bedside in the presence of patient's husband.  No further GI bleeds noted since admission.  Tube feeds were on hold but maybe restarted per GI  Physical Exam: Vitals:   05/06/22 2154 05/07/22 0212 05/07/22 0422 05/07/22 0801  BP: (!) 130/59 (!) 148/81 135/71 (!) 139/56  Pulse: 98 (!) 106 90 95  Resp: 16 16 20 17   Temp: 98.6 F (37 C) 98.4 F (36.9 C) 98.8 F (  37.1 C) 98.7 F (37.1 C)  TempSrc: Oral Oral    SpO2: 100% 96% 100% 99%    Physical Exam Constitutional:      General: She is not in acute distress.    Appearance: She is ill-appearing.  HENT:     Head: Normocephalic and atraumatic.  Eyes:     Conjunctiva/sclera: Conjunctivae normal.  Cardiovascular:     Rate and Rhythm: Normal rate and regular rhythm.     Heart sounds: No murmur  heard. Pulmonary:     Breath sounds: Normal breath sounds.  Abdominal:     General: Abdomen is flat. There is no distension.     Hernia: Right femoral hernia: pegtube present.     Comments: Pegtube tube present  Genitourinary:    Comments: Foley catheter     Data Reviewed:     Family Communication: Patient's husband updated at the bedside    Advance Care Planning:   Code Status: Full Code.  Verified by patient husband  Disposition: Status is: Inpatient Remains inpatient appropriate because: Potential for hemodynamic instability and packed red cell transfusions as well as further intervention by GI  Planned Discharge Destination: Skilled nursing facility    Time spent: 35 minutes  Author: Adan Sis, MD 05/07/2022 3:23 PM  For on call review [email or web address].

## 2022-05-08 DIAGNOSIS — D649 Anemia, unspecified: Secondary | ICD-10-CM | POA: Diagnosis not present

## 2022-05-08 DIAGNOSIS — K922 Gastrointestinal hemorrhage, unspecified: Secondary | ICD-10-CM | POA: Diagnosis not present

## 2022-05-08 LAB — BASIC METABOLIC PANEL
Anion gap: 7 (ref 5–15)
BUN: 31 mg/dL — ABNORMAL HIGH (ref 8–23)
CO2: 22 mmol/L (ref 22–32)
Calcium: 8.8 mg/dL — ABNORMAL LOW (ref 8.9–10.3)
Chloride: 114 mmol/L — ABNORMAL HIGH (ref 98–111)
Creatinine, Ser: 1.82 mg/dL — ABNORMAL HIGH (ref 0.44–1.00)
GFR, Estimated: 30 mL/min — ABNORMAL LOW (ref >60)
Glucose, Bld: 207 mg/dL — ABNORMAL HIGH (ref 70–99)
Potassium: 4.8 mmol/L (ref 3.5–5.1)
Sodium: 143 mmol/L (ref 135–145)

## 2022-05-08 LAB — CBC
HCT: 29 % — ABNORMAL LOW (ref 36.0–46.0)
Hemoglobin: 9 g/dL — ABNORMAL LOW (ref 12.0–15.0)
MCH: 31.4 pg (ref 26.0–34.0)
MCHC: 31 g/dL (ref 30.0–36.0)
MCV: 101 fL — ABNORMAL HIGH (ref 80.0–100.0)
Platelets: 756 10*3/uL — ABNORMAL HIGH (ref 150–400)
RBC: 2.87 MIL/uL — ABNORMAL LOW (ref 3.87–5.11)
RDW: 13.2 % (ref 11.5–15.5)
WBC: 8.2 10*3/uL (ref 4.0–10.5)
nRBC: 0 % (ref 0.0–0.2)

## 2022-05-08 LAB — GLUCOSE, CAPILLARY
Glucose-Capillary: 199 mg/dL — ABNORMAL HIGH (ref 70–99)
Glucose-Capillary: 274 mg/dL — ABNORMAL HIGH (ref 70–99)
Glucose-Capillary: 309 mg/dL — ABNORMAL HIGH (ref 70–99)
Glucose-Capillary: 439 mg/dL — ABNORMAL HIGH (ref 70–99)
Glucose-Capillary: 388 mg/dL — ABNORMAL HIGH (ref 70–99)
Glucose-Capillary: 252 mg/dL — ABNORMAL HIGH (ref 70–99)
Glucose-Capillary: 103 mg/dL — ABNORMAL HIGH (ref 70–99)

## 2022-05-08 LAB — PHOSPHORUS: Phosphorus: 4.8 mg/dL — ABNORMAL HIGH (ref 2.5–4.6)

## 2022-05-08 LAB — MAGNESIUM: Magnesium: 1.8 mg/dL (ref 1.7–2.4)

## 2022-05-08 NOTE — Inpatient Diabetes Management (Signed)
Inpatient Diabetes Program Recommendations  AACE/ADA: New Consensus Statement on Inpatient Glycemic Control (2015)  Target Ranges:  Prepandial:   less than 140 mg/dL      Peak postprandial:   less than 180 mg/dL (1-2 hours)      Critically ill patients:  140 - 180 mg/dL   Lab Results  Component Value Date   GLUCAP 274 (H) 05/08/2022   HGBA1C 7.2 (H) 04/18/2022    Review of Glycemic Control  Latest Reference Range & Units 05/08/22 08:03  Glucose-Capillary 70 - 99 mg/dL 274 (H)  (H): Data is abnormally high  Diabetes history: DM1(does not make insulin.  Needs correction, basal and meal coverage) Outpatient Diabetes medications:  Basaglar 12 units QHS Novolog 0-9 units TID and 5 units TID with meals  Current orders for Inpatient glycemic control:  Novolog 0-9 units Q4H   Inpatient Diabetes Program Recommendations:     Semglee 10 units QHS (80% of home dose) Once eating Novolog 3 units TID with meals if she consumes at least 50%  Will continue to follow while inpatient.  Thank you, Reche Dixon, MSN, [place] Diabetes Coordinator Inpatient Diabetes Program [phone] (team pager from 8a-5p)

## 2022-05-08 NOTE — Progress Notes (Addendum)
Pt has blood sugar of 439. MD was informed and ordered to give the max 9 units of Novolog and hold the Osmolite 1.5 tube feeding for now.  Rechecked blood sugar, 388 now.

## 2022-05-08 NOTE — Progress Notes (Signed)
GI Inpatient Follow-up Note  Subjective:  Patient seen in follow-up for coffee-ground emesis and hematochezia. No acute events overnight. Per nursing report, no overt bloody or tarry stools. No emesis episodes. No family at bedside. Patient is able to tell me her name, address, and husband's name but unable to tell me year, her current location, or current President. She denies any specific complaints. Hemoglobin 9.0 this morning.   Scheduled Inpatient Medications:   Chlorhexidine Gluconate Cloth  6 each Topical Daily   cholecalciferol  2,000 Units Per Tube Daily   cyanocobalamin  1,000 mcg Per Tube Daily   feeding supplement (OSMOLITE 1.5 CAL)  237 mL Per Tube 6 X Daily   folic acid  1 mg Per Tube Daily   free water  100 mL Per Tube 6 X Daily   insulin aspart  0-9 Units Subcutaneous Q4H   metoCLOPramide  5 mg Per Tube TID AC   multivitamin with minerals  1 tablet Per Tube Daily   Netarsudil Dimesylate  1 drop Both Eyes QPM   pantoprazole (PROTONIX) IV  40 mg Intravenous Q12H   sodium chloride flush  3 mL Intravenous Q12H   thiamine  100 mg Per Tube Daily   Travoprost (BAK Free)  1 drop Both Eyes QHS    PRN Inpatient Medications:  acetaminophen (TYLENOL) oral liquid 160 mg/5 mL, hydrOXYzine, loperamide HCl, magnesium hydroxide, polyethylene glycol  Review of Systems:  Unable to obtain due to baseline mental status/encephalopathy   Physical Examination: BP (!) 154/54 (BP Location: Left Arm)   Pulse 89   Temp 97.7 F (36.5 C) (Oral)   Resp 16   Wt 87.2 kg   LMP 09/09/2004 (Approximate)   SpO2 97%   BMI 27.58 kg/m  Gen: NAD, alert and oriented to person  HEENT: PEERLA, EOMI, Neck: supple, no JVD or thyromegaly Chest: CTA bilaterally, no wheezes, crackles, or other adventitious sounds CV: RRR, no m/g/c/r Abd: soft, NT, ND, +BS in all four quadrants; no HSM, guarding, ridigity, or rebound tenderness Ext: no edema, well perfused with 2+ pulses, Skin: no rash or lesions  noted Lymph: no LAD  Data: Lab Results  Component Value Date   WBC 11.1 (H) 05/07/2022   HGB 9.0 (L) 05/07/2022   HCT 28.4 (L) 05/07/2022   MCV 100.0 05/07/2022   PLT 717 (H) 05/07/2022   Recent Labs  Lab 05/06/22 1450 05/06/22 2216 05/07/22 0444  HGB 9.6* 8.9* 9.0*   Lab Results  Component Value Date   NA 143 05/08/2022   K 4.8 05/08/2022   CL 114 (H) 05/08/2022   CO2 22 05/08/2022   BUN 31 (H) 05/08/2022   CREATININE 1.82 (H) 05/08/2022   GLU 208 03/29/2019   Lab Results  Component Value Date   ALT 7 05/06/2022   AST 15 05/06/2022   ALKPHOS 58 05/06/2022   BILITOT 0.6 05/06/2022   Recent Labs  Lab 05/06/22 1504  INR 1.0    Assessment/Plan:  67 y/o Caucasian female with a PMH of recently diagnosed Wernicke-Korsakoff syndrome with prolonged hospitalization (12/28 - 05/06/22), T1DM, HTN, CKD Stage IIIa, and alcohol use disorder presented to the [facility] ED for chief complaint of coffee-ground emesis and hematochezia   Coffee-ground emesis/Hematochezia - concerning for chronic GI bleed but unclear if UGI or LGI source. DDx includes peptic ulcer disease, gastritis +/- H pylori, erosive esophagitis, AVMs, Dieulafoy's lesion, GAVE, colon polyps, colon neoplasm, other occult GI malignancy, etc. H&H stable with no significant drop in hemoglobin compared  to one week ago. No overt gastrointestinal bleeding.    Encephalopathy - presumed 2/2 Wernicke-Korsakoff syndrome related to alcohol use disorder. She is receiving folic acid, thiamine, and B12.    Macrocytic anemia - likely 2/2 bone marrow suppression in setting of EtOH abuse   Bilateral pleural effusions    CKD Stage IIIa   Type 1 DM   Alcohol use disorder   Recommendations:   - Maintain 2 large bore IVs for access - Continue to monitor serial H&H. Transfuse for Hgb <7.0.  - Continue Protonix IV BID for gastric protection  - Continue to monitor for signs of GI bleeding - No signs of overt gastrointestinal  bleeding at this time  - No plans for endoscopic evaluation at present time given hemodynamic stability and no overt gastrointestinal bleeding - Continue tube feeding per RD - Continue supportive care per primary team  - GI will continue to follow for now - EGD and colonoscopy will be considered when clinically feasible and after discussing options with family. Attempted to call husband this morning with no answer.   Please call with questions or concerns.   Octavia Bruckner, PA-C [facility] [phone]

## 2022-05-08 NOTE — Evaluation (Signed)
Physical Therapy Evaluation Patient Details Name: Laurie Lin MRN: [id number] DOB: 08-06-1955 Today's Date: 05/08/2022  History of Present Illness  Pt is a 67 y.o. female presenting to hospital 05/06/22 with hematochezia, coffee-ground emesis, and BRBPR (returned to hospital same day discharged from hospital to SNF).  Recent hospital admission (04/17/22-05/06/22) for encephalopathy, alcohol withdrawal complicated by Wernicke-Korsakoff requiring PEG tube placement and SNF placement.  Pt now admitted with acute GI bleeding, anemia, and CKD.  PMH includes DM1, stage IIIa CKD, htn, ETOH abuse.  Clinical Impression  Pt sitting on edge of bed with OT present upon PT arrival; performed PT/OT co-session.  Prior to recent hospital admissions, pt was independent with functional mobility (most recently requiring max assist x2 with mobility); admitted from SNF but typically lives with her husband.  Oriented to self only; poor safety awareness noted.  Currently pt is mod assist x2 to stand from elevated bed height up to RW; mod assist x2 to sidestep to L along bed (x2 small steps B LE's) with RW use; and max assist x2 to lay down in bed.  Pt would benefit from skilled PT to address noted impairments and functional limitations (see below for any additional details).  Upon hospital discharge, pt would benefit from SNF.    Recommendations for follow up therapy are one component of a multi-disciplinary discharge planning process, led by the attending physician.  Recommendations may be updated based on patient status, additional functional criteria and insurance authorization.  Follow Up Recommendations Skilled nursing-short term rehab (<3 hours/day) Can patient physically be transported by private vehicle: No    Assistance Recommended at Discharge Frequent or constant Supervision/Assistance  Patient can return home with the following  Two people to help with walking and/or transfers;Direct supervision/assist for  medications management;Two people to help with bathing/dressing/bathroom;Help with stairs or ramp for entrance;Assist for transportation;Assistance with feeding;Direct supervision/assist for financial management;Assistance with cooking/housework    Equipment Recommendations BSC/3in1;Wheelchair (measurements PT);Wheelchair cushion (measurements PT);Hospital bed;Other (comment) (hoyer lift)  Recommendations for Other Services       Functional Status Assessment Patient has had a recent decline in their functional status and demonstrates the ability to make significant improvements in function in a reasonable and predictable amount of time.     Precautions / Restrictions Precautions Precautions: Fall Precaution Comments: PEG tube (with abdominal binder), foley catheter Restrictions Weight Bearing Restrictions: No      Mobility  Bed Mobility Overal bed mobility: Needs Assistance Bed Mobility: Supine to Sit     Supine to sit: Max assist (with OT) Sit to supine: Max assist, +2 for physical assistance   General bed mobility comments: vc's for technique; assist for trunk and B LE's; 2 assist to boost pt up in bed using bed sheet    Transfers Overall transfer level: Needs assistance Equipment used: Rolling walker (2 wheels) Transfers: Sit to/from Stand Sit to Stand: Mod assist, +2 physical assistance, From elevated surface           General transfer comment: mod assist x2 to stand from elevated bed height; vc's and tactile cues for UE placement and overall technique; assist to initiate stand and control descent sitting    Ambulation/Gait Ambulation/Gait assistance: Mod assist, +2 physical assistance Gait Distance (Feet):  (side stepped to L 2 small steps B LE's along bed/towards HOB) Assistive device: Rolling walker (2 wheels)   Gait velocity: decreased     General Gait Details: increased effort to take steps; vc's for technique; assist for walker movement  Stairs             Wheelchair Mobility    Modified Rankin (Stroke Patients Only)       Balance Overall balance assessment: Needs assistance Sitting-balance support: Bilateral upper extremity supported, Feet supported Sitting balance-Leahy Scale: Fair Sitting balance - Comments: steady static sitting (CGA)   Standing balance support: Bilateral upper extremity supported, Reliant on assistive device for balance Standing balance-Leahy Scale: Poor Standing balance comment: assist to steady in standing with B UE support on RW                             Pertinent Vitals/Pain Pain Assessment Pain Assessment: Faces Faces Pain Scale: No hurt Pain Intervention(s): Limited activity within patient's tolerance, Monitored during session, Repositioned    Home Living Family/patient expects to be discharged to:: Private residence Living Arrangements: Spouse/significant other Available Help at Discharge: Family;Available 24 hours/day Type of Home: House Home Access: Stairs to enter Entrance Stairs-Rails: Right;Can reach Software engineer of Steps: 2   Home Layout: One level Home Equipment: Shower seat      Prior Function Prior Level of Function : Independent/Modified Independent;History of Falls (last six months)             Mobility Comments: Ambulatory without any AD prior to recent hospitalizations; recently max assist x2 for mobility ADLs Comments: Per OT eval "generally MOD I for dressing, grooming; bathing with wipes the last few months; husband assists with IADLs (prior to admission 12/28-1/16) now MAX A -TOTAL A for ADL/IADL pre rehab"     Hand Dominance        Extremity/Trunk Assessment   Upper Extremity Assessment Upper Extremity Assessment: Generalized weakness;Defer to OT evaluation RUE Deficits / Details: Per OT eval "global weakness; PROM appears WFL" LUE Deficits / Details: Per OT eval "global weakness, PROM appears WFL with L hand contracture  less pronounced"    Lower Extremity Assessment Lower Extremity Assessment: Generalized weakness       Communication   Communication: Expressive difficulties  Cognition Arousal/Alertness: Awake/alert Behavior During Therapy: Flat affect Overall Cognitive Status: Impaired/Different from baseline Area of Impairment: Orientation, Attention, Memory, Following commands, Safety/judgement, Awareness, Problem solving                 Orientation Level: Disoriented to, Place, Time, Situation Current Attention Level: Focused Memory: Decreased short-term memory Following Commands: Follows one step commands inconsistently Safety/Judgement: Decreased awareness of safety, Decreased awareness of deficits Awareness: Intellectual Problem Solving: Slow processing, Decreased initiation, Difficulty sequencing, Requires tactile cues, Requires verbal cues          General Comments General comments (skin integrity, edema, etc.): Abdominal binder adjusted end of session; all lines/leads appearing intact.  Yellow discharge noted in standing but foley appearing in place--nurse notified and came to assess.  Nursing cleared pt for participation in therapy.  Pt agreeable to therapy session.    Exercises     Assessment/Plan    PT Assessment Patient needs continued PT services  PT Problem List Decreased strength;Decreased range of motion;Decreased coordination;Decreased cognition;Decreased activity tolerance;Decreased balance;Decreased mobility;Decreased safety awareness;Decreased knowledge of use of DME       PT Treatment Interventions DME instruction;Therapeutic exercise;Gait training;Balance training;Patient/family education;Therapeutic activities;Functional mobility training;Cognitive remediation;Stair training;Neuromuscular re-education;Wheelchair mobility training    PT Goals (Current goals can be found in the Care Plan section)  Acute Rehab PT Goals Patient Stated Goal: to improve  mobility PT Goal Formulation: With patient/family Time  For Goal Achievement: 05/22/22 Potential to Achieve Goals: Fair    Frequency Min 2X/week     Co-evaluation PT/OT/SLP Co-Evaluation/Treatment: Yes Reason for Co-Treatment: Complexity of the patient's impairments (multi-system involvement);Necessary to address cognition/behavior during functional activity;For patient/therapist safety;To address functional/ADL transfers PT goals addressed during session: Mobility/safety with mobility;Balance OT goals addressed during session: ADL's and self-care       AM-PAC PT "6 Clicks" Mobility  Outcome Measure Help needed turning from your back to your side while in a flat bed without using bedrails?: A Lot Help needed moving from lying on your back to sitting on the side of a flat bed without using bedrails?: A Lot Help needed moving to and from a bed to a chair (including a wheelchair)?: Total Help needed standing up from a chair using your arms (e.g., wheelchair or bedside chair)?: Total Help needed to walk in hospital room?: Total Help needed climbing 3-5 steps with a railing? : Total 6 Click Score: 8    End of Session Equipment Utilized During Treatment: Other (comment) (abdominal binder) Activity Tolerance: Patient limited by fatigue Patient left: in bed;with call bell/phone within reach;with bed alarm set;with nursing/sitter in room;with SCD's reapplied;Other (comment) (B heels floating via pillow support; pt's nurse present providing pt care) Nurse Communication: Mobility status;Precautions PT Visit Diagnosis: Unsteadiness on feet (R26.81);Muscle weakness (generalized) (M62.81);Other abnormalities of gait and mobility (R26.89)    Time: 4360-6770 PT Time Calculation (min) (ACUTE ONLY): 15 min   Charges:   PT Evaluation $PT Eval Low Complexity: 1 Low         Kinnedy Mongiello, PT 05/08/22, 3:41 PM

## 2022-05-08 NOTE — Progress Notes (Signed)
Progress Note   Patient: Laurie Lin XIP:[id number] DOB: Jul 06, 1955 DOA: 05/06/2022     2 DOS: the patient was seen and examined on 05/08/2022   Brief hospital course: 67 y.o. female with medical history significant of recently diagnosed Wernicke-Korsakoff syndrome with prolonged hospitalization (04/17/2022 - 05/06/2022), type 1 diabetes, hypertension, CKD stage IIIa, alcohol use disorder who presents to the ED with complaints of coffee-ground emesis and hematochezia.  History obtained through chart review and from patient's husband at bedside, as patient is altered at baseline.   Per chart review, patient was discharged from the hospital earlier today after a prolonged hospitalization for Wernicke-Korsakoff syndrome complicated by Klebsiella UTI, encephalopathy requiring Precedex, and now with PEG and foley dependence.    Per patient's husband, when she arrived at her facility this afternoon, she had a large bout of coffee-ground emesis.  When she was being cleaned, they noticed that she had bright red blood per rectum.  Patient was seen by GI who is considering EGD and colonoscopy once patient is more stable and family agrees  Assessment and Plan: Acute GI bleeding Patient presenting with sudden onset coffee-ground emesis with bright red blood per rectum.  Coffee-ground emesis was witnessed by EMS and bright red blood was witnessed by SNF staff.  With this history, difficult to discern if this is an upper or lower bleed or combination of both.  Hemodynamically stable right now with stable hemoglobin.   - GI consulted; recommendations for monitoring hemoglobin and vital signs closely and consider EGD and colonoscopy when clinically feasible and family agrees. Restart tube feedings - Protonix 40 mg IV twice daily - If any hemodynamic compromise should occur or sharp decrease in hemoglobin may review further evaluation by GI   Acute on chronic anemia due to blood loss Per chart review, patient  has a history of macrocytic anemia dating back to 2018 with most recent hemoglobin ranging between 9-10.  Likely multifactorial in the setting of B12 deficiency and bone marrow suppression in the setting of active alcohol use disorder.  Hemoglobin decreased from 10.4 to 9.0 likely due to concern for GI bleed.   - CBC every 8 hours - Transfuse for hemoglobin less than 7 or if hemodynamically compromised   Chronic kidney disease, stage 3 unspecified ([place]) Per review, patient has a history of CKD stage IIIb - IV in the setting of type 1 diabetes.  Creatinine  is elevated at 1.75 on 05/07/2022 however was 1.81 several days ago.  Not sure if this is AKI   -Gentle hydration - Hold home nephrotoxic agents to see if there is any improvement - Repeat BMP in the a.m.       Type 1 diabetes mellitus with hyperglycemia (HCC) - restart tube feeds per GI - SSI,    Wernicke-Korsakoff syndrome ([place]) Mentation at baseline at this time.   - Continue home folic acid, thiamine and B12 supplementation   Advance Care Planning:   Code Status: Full Code.  Verified by patient's husband at bedside  Consultants:GI     Subjective: Seen at the bedside patient has been noted by RN to be tolerating tube feedings without any further coffee-ground emesis.  Physical Exam: Vitals:   05/07/22 1543 05/07/22 1928 05/08/22 0422 05/08/22 0806  BP: (!) 141/64 126/63 95/71 (!) 154/54  Pulse: 98 90 99 89  Resp: 16 17  16   Temp: 98.1 F (36.7 C) 98 F (36.7 C) 98.2 F (36.8 C) 97.7 F (36.5 C)  TempSrc:  Oral Oral  Oral  SpO2: 100% 98% 99% 97%  Weight:   87.2 kg     Physical Exam Constitutional:      General: She is not in acute distress.    Appearance: She is ill-appearing.  HENT:     Head: Normocephalic and atraumatic.  Eyes:     Conjunctiva/sclera: Conjunctivae normal.  Cardiovascular:     Rate and Rhythm: Normal rate and regular rhythm.     Heart sounds: No murmur heard. Pulmonary:     Breath sounds:  Normal breath sounds.  Abdominal:     General: Abdomen is flat. There is no distension.     Hernia: Right femoral hernia: pegtube present.     Comments: Pegtube tube present  Genitourinary:    Comments: Foley catheter     Data Reviewed:     Family Communication: Patient's husband updated at the bedside    Advance Care Planning:   Code Status: Full Code.  Verified by patient husband  Disposition: Status is: Inpatient Remains inpatient appropriate because: Potential for hemodynamic instability and packed red cell transfusions as well as further intervention by GI  Planned Discharge Destination: Skilled nursing facility    Time spent: 35 minutes  Author: Adan Sis, MD 05/08/2022 2:43 PM  For on call review [email or web address].

## 2022-05-08 NOTE — Care Management Important Message (Signed)
Important Message  Patient Details  Name: JHADE BERKO MRN: [id number] Date of Birth: February 22, 1956   Medicare Important Message Given:  N/A - LOS <3 / Initial given by admissions     Dannette Barbara 05/08/2022, 1:19 PM

## 2022-05-08 NOTE — Evaluation (Addendum)
Occupational Therapy Evaluation Patient Details Name: Laurie Lin MRN: [id number] DOB: 1955-11-26 Today's Date: 05/08/2022   History of Present Illness Pt is a 67 year old admitted with acute GI bleed after sudden onset coffee-ground emesis with bright red blood per rectum at rehab; PMH significant for Wernicke-Korsakoff syndrome with prolonged hospitalization (04/17/2022 - 05/06/2022), type 1 diabetes, hypertension, CKD stage IIIa, alcohol use disorder   Clinical Impression   Chart reviewed, nurse cleared pt for participation in OT evaluation. PT present for OOB mobility portion with +2 required. Pt is oriented to self only, continues to pull at lines/leads with mitts removed, poor safety awareness and current level of functioning. Pt is motivated by mention of family. Husband in room, endorses no change in home set up, pt was at rehab for less than a day prior to return to ED. Pt presents with deficits in strength, endurance, activity tolerance, cognition all affecting safe and optimal ADL completion. MAX A +1-2 required for bed mobility, STS with MOD-MAX A +2 with RW. MAX A required fro all ADLs at this time. Recommend return to STR to address functional deficits. OT will follow acutely.      Recommendations for follow up therapy are one component of a multi-disciplinary discharge planning process, led by the attending physician.  Recommendations may be updated based on patient status, additional functional criteria and insurance authorization.   Follow Up Recommendations  Skilled nursing-short term rehab (<3 hours/day)     Assistance Recommended at Discharge Frequent or constant Supervision/Assistance  Patient can return home with the following Two people to help with walking and/or transfers;Two people to help with bathing/dressing/bathroom    Functional Status Assessment  Patient has had a recent decline in their functional status and demonstrates the ability to make significant  improvements in function in a reasonable and predictable amount of time.  Equipment Recommendations  Other (comment) (per next venue of care)    Recommendations for Other Services       Precautions / Restrictions Precautions Precautions: Fall Precaution Comments: PEG, foley Restrictions Weight Bearing Restrictions: No      Mobility Bed Mobility Overal bed mobility: Needs Assistance Bed Mobility: Supine to Sit, Sit to Supine     Supine to sit: Max assist Sit to supine: Max assist, +2 for physical assistance        Transfers Overall transfer level: Needs assistance Equipment used: Rolling walker (2 wheels) Transfers: Sit to/from Stand Sit to Stand: +2 physical assistance, Mod assist, Max assist           General transfer comment: steps up the bed to the left with RW with MOD A +2, RW, step by step multi modal cueing      Balance Overall balance assessment: Needs assistance Sitting-balance support: Feet supported, Bilateral upper extremity supported Sitting balance-Leahy Scale: Fair Sitting balance - Comments: CGA to maintain static sitting balance   Standing balance support: Bilateral upper extremity supported, Reliant on assistive device for balance Standing balance-Leahy Scale: Poor                             ADL either performed or assessed with clinical judgement   ADL Overall ADL's : Needs assistance/impaired     Grooming: Sitting;Wash/dry face;Maximal assistance;Cueing for sequencing           Upper Body Dressing : Maximal assistance   Lower Body Dressing: Maximal assistance;Total assistance       Toileting- Clothing Manipulation  and Hygiene: Total assistance               Vision   Additional Comments: will continue to assess     Perception     Praxis      Pertinent Vitals/Pain Pain Assessment Pain Assessment: Faces Faces Pain Scale: No hurt     Hand Dominance     Extremity/Trunk Assessment Upper Extremity  Assessment Upper Extremity Assessment: Generalized weakness RUE Deficits / Details: global weakness; PROM appears WFL LUE Deficits / Details: global weakness, PROM appears WFL with L hand contracture less pronoucned   Lower Extremity Assessment Lower Extremity Assessment: Generalized weakness       Communication Communication Communication: Expressive difficulties   Cognition Arousal/Alertness: Awake/alert Behavior During Therapy: Flat affect Overall Cognitive Status: Impaired/Different from baseline Area of Impairment: Orientation, Attention, Memory, Following commands, Safety/judgement, Awareness, Problem solving                 Orientation Level: Disoriented to, Place, Time, Situation (didn't know her name, but said "yes" when asked if her name was [name], knew her husbands name) Current Attention Level: Focused Memory: Decreased short-term memory Following Commands: Follows one step commands inconsistently Safety/Judgement: Decreased awareness of safety, Decreased awareness of deficits Awareness: Intellectual Problem Solving: Slow processing, Decreased initiation, Difficulty sequencing, Requires tactile cues, Requires verbal cues       General Comments  abd binder adjusted at end of session, all lines/leads appaer intact, pt with yellow discharge upon standing, RN present, foley appears in place, RN to assess    Exercises     Shoulder Instructions      Home Living Family/patient expects to be discharged to:: Private residence Living Arrangements: Spouse/significant other Available Help at Discharge: Family;Available 24 hours/day Type of Home: House Home Access: Stairs to enter [employer] of Steps: 2 Entrance Stairs-Rails: Right;Can reach both;Left Home Layout: One level     Bathroom Shower/Tub: Tub/shower unit         Home Equipment: Shower seat          Prior Functioning/Environment Prior Level of Function : Independent/Modified  Independent;History of Falls (last six months)             Mobility Comments: was amb with no AD prior to previous admission, now MAX A +2 for mobility pre reahb ADLs Comments: generally MOD I for dressing, grooming; bathing with wipes the last few months; husband assists with IADLs (prior to admissio n12/28-1/16) now MAX A -TOTAL A for ADL/IADL pre rehab        OT Problem List: Decreased strength;Decreased activity tolerance;Impaired balance (sitting and/or standing);Decreased safety awareness;Decreased cognition;Decreased knowledge of precautions;Decreased knowledge of use of DME or AE      OT Treatment/Interventions: Self-care/ADL training;Patient/family education;Therapeutic exercise;Balance training;Therapeutic activities;DME and/or AE instruction;Cognitive remediation/compensation;Neuromuscular education;Splinting;Modalities;Manual therapy;Visual/perceptual remediation/compensation    OT Goals(Current goals can be found in the care plan section) Acute Rehab OT Goals Patient Stated Goal: go to rehab OT Goal Formulation: With family Time For Goal Achievement: 05/22/22 Potential to Achieve Goals: Good ADL Goals Pt Will Perform Grooming: with supervision;sitting Pt Will Transfer to Toilet: with supervision Pt Will Perform Toileting - Clothing Manipulation and hygiene: with supervision;sit to/from stand Pt Will Perform Tub/Shower Transfer: with supervision  OT Frequency: Min 2X/week    Co-evaluation   Reason for Co-Treatment: Complexity of the patient's impairments (multi-system involvement);Necessary to address cognition/behavior during functional activity;For patient/therapist safety;To address functional/ADL transfers   OT goals addressed during session: ADL's and self-care  AM-PAC OT "6 Clicks" Daily Activity     Outcome Measure Help from another person eating meals?: Total Help from another person taking care of personal grooming?: Total Help from another person  toileting, which includes using toliet, bedpan, or urinal?: Total Help from another person bathing (including washing, rinsing, drying)?: Total Help from another person to put on and taking off regular upper body clothing?: A Lot Help from another person to put on and taking off regular lower body clothing?: A Lot 6 Click Score: 8   End of Session Equipment Utilized During Treatment: Rolling walker (2 wheels) Nurse Communication: Mobility status  Activity Tolerance: Patient tolerated treatment well Patient left: in bed;with call bell/phone within reach (with PT, nurse, husband present)  OT Visit Diagnosis: Other abnormalities of gait and mobility (R26.89);Other symptoms and signs involving cognitive function                Time: 1415-1444 OT Time Calculation (min): 29 min Charges:  OT General Charges $OT Visit: 1 Visit OT Evaluation $OT Eval Moderate Complexity: 1 Mod  Shanon Payor, OTD OTR/L  05/08/22, 3:19 PM

## 2022-05-09 ENCOUNTER — Encounter: Payer: Self-pay | Admitting: Internal Medicine

## 2022-05-09 DIAGNOSIS — K922 Gastrointestinal hemorrhage, unspecified: Secondary | ICD-10-CM | POA: Diagnosis not present

## 2022-05-09 LAB — BASIC METABOLIC PANEL
Anion gap: 6 (ref 5–15)
BUN: 29 mg/dL — ABNORMAL HIGH (ref 8–23)
CO2: 20 mmol/L — ABNORMAL LOW (ref 22–32)
Calcium: 8.3 mg/dL — ABNORMAL LOW (ref 8.9–10.3)
Chloride: 112 mmol/L — ABNORMAL HIGH (ref 98–111)
Creatinine, Ser: 1.84 mg/dL — ABNORMAL HIGH (ref 0.44–1.00)
GFR, Estimated: 30 mL/min — ABNORMAL LOW (ref >60)
Glucose, Bld: 316 mg/dL — ABNORMAL HIGH (ref 70–99)
Potassium: 4.7 mmol/L (ref 3.5–5.1)
Sodium: 138 mmol/L (ref 135–145)

## 2022-05-09 LAB — GLUCOSE, CAPILLARY
Glucose-Capillary: 172 mg/dL — ABNORMAL HIGH (ref 70–99)
Glucose-Capillary: 241 mg/dL — ABNORMAL HIGH (ref 70–99)
Glucose-Capillary: 260 mg/dL — ABNORMAL HIGH (ref 70–99)
Glucose-Capillary: 285 mg/dL — ABNORMAL HIGH (ref 70–99)
Glucose-Capillary: 302 mg/dL — ABNORMAL HIGH (ref 70–99)
Glucose-Capillary: 374 mg/dL — ABNORMAL HIGH (ref 70–99)

## 2022-05-09 LAB — CBC
HCT: 26 % — ABNORMAL LOW (ref 36.0–46.0)
Hemoglobin: 8.3 g/dL — ABNORMAL LOW (ref 12.0–15.0)
MCH: 31.7 pg (ref 26.0–34.0)
MCHC: 31.9 g/dL (ref 30.0–36.0)
MCV: 99.2 fL (ref 80.0–100.0)
Platelets: 684 10*3/uL — ABNORMAL HIGH (ref 150–400)
RBC: 2.62 MIL/uL — ABNORMAL LOW (ref 3.87–5.11)
RDW: 13.2 % (ref 11.5–15.5)
WBC: 8.9 10*3/uL (ref 4.0–10.5)
nRBC: 0 % (ref 0.0–0.2)

## 2022-05-09 LAB — MAGNESIUM: Magnesium: 1.7 mg/dL (ref 1.7–2.4)

## 2022-05-09 LAB — PHOSPHORUS: Phosphorus: 4.4 mg/dL (ref 2.5–4.6)

## 2022-05-09 MED ORDER — INSULIN GLARGINE-YFGN 100 UNIT/ML ~~LOC~~ SOLN
10.0000 [IU] | Freq: Every day | SUBCUTANEOUS | Status: DC
  Administered 2022-05-09 – 2022-05-11 (×3): 10 [IU] via SUBCUTANEOUS
  Filled 2022-05-09 (×4): qty 0.1

## 2022-05-09 MED ORDER — SODIUM CHLORIDE 0.9 % IV BOLUS
250.0000 mL | Freq: Once | INTRAVENOUS | Status: AC
  Administered 2022-05-09: 250 mL via INTRAVENOUS

## 2022-05-09 MED ORDER — INSULIN ASPART 100 UNIT/ML IJ SOLN
0.0000 [IU] | INTRAMUSCULAR | Status: DC
  Administered 2022-05-09: 8 [IU] via SUBCUTANEOUS
  Administered 2022-05-09: 3 [IU] via SUBCUTANEOUS
  Administered 2022-05-09: 11 [IU] via SUBCUTANEOUS
  Administered 2022-05-09: 5 [IU] via SUBCUTANEOUS
  Administered 2022-05-10: 7 [IU] via SUBCUTANEOUS
  Administered 2022-05-10: 8 [IU] via SUBCUTANEOUS
  Administered 2022-05-10: 5 [IU] via SUBCUTANEOUS
  Administered 2022-05-10: 8 [IU] via SUBCUTANEOUS
  Administered 2022-05-10: 15 [IU] via SUBCUTANEOUS
  Administered 2022-05-11: 11 [IU] via SUBCUTANEOUS
  Administered 2022-05-11: 3 [IU] via SUBCUTANEOUS
  Administered 2022-05-11: 5 [IU] via SUBCUTANEOUS
  Administered 2022-05-11: 3 [IU] via SUBCUTANEOUS
  Administered 2022-05-11: 5 [IU] via SUBCUTANEOUS
  Administered 2022-05-11: 8 [IU] via SUBCUTANEOUS
  Filled 2022-05-09 (×15): qty 1

## 2022-05-09 MED ORDER — FREE WATER
200.0000 mL | Freq: Every day | Status: DC
  Administered 2022-05-09 – 2022-05-12 (×19): 200 mL

## 2022-05-09 MED ORDER — INSULIN ASPART 100 UNIT/ML IJ SOLN
3.0000 [IU] | Freq: Three times a day (TID) | INTRAMUSCULAR | Status: DC
  Administered 2022-05-09 – 2022-05-11 (×8): 3 [IU] via SUBCUTANEOUS
  Filled 2022-05-09 (×7): qty 1

## 2022-05-09 NOTE — Progress Notes (Addendum)
Progress Note   Patient: Laurie Lin NLZ:[id number] DOB: August 18, 1955 DOA: 05/06/2022     3 DOS: the patient was seen and examined on 05/09/2022   Brief hospital course: 67 y.o. female with medical history significant of recently diagnosed Wernicke-Korsakoff syndrome with prolonged hospitalization (04/17/2022 - 05/06/2022), type 1 diabetes, hypertension, CKD stage IIIa, alcohol use disorder who presents to the ED with complaints of coffee-ground emesis and hematochezia.  History obtained through chart review and from patient's husband at bedside, as patient is altered at baseline.   Per chart review, patient was discharged from the hospital earlier today after a prolonged hospitalization for Wernicke-Korsakoff syndrome complicated by Klebsiella UTI, encephalopathy requiring Precedex, and now with PEG and foley dependence.    Per patient's husband, when she arrived at her facility this afternoon, she had a large bout of coffee-ground emesis.  When she was being cleaned, they noticed that she had bright red blood per rectum.  Patient was seen by GI who is considering EGD and colonoscopy once patient is more stable and family agrees  Assessment and Plan: Acute GI bleeding Patient presenting with sudden onset coffee-ground emesis with bright red blood per rectum.  Coffee-ground emesis was witnessed by EMS and bright red blood was witnessed by SNF staff.  With this history, difficult to discern if this is an upper or lower bleed or combination of both.  hgb continues to trend down - GI consulted; recommendations for monitoring hemoglobin and vital signs closely and consider EGD and colonoscopy when clinically feasible and family agrees. Restart tube feedings, hasn't yet been ordered, will do so today - Protonix 40 mg IV twice daily - If any hemodynamic compromise should occur or sharp decrease in hemoglobin may review further evaluation by GI   Acute on chronic anemia due to blood loss Per chart  review, patient has a history of macrocytic anemia dating back to 2018 with most recent hemoglobin ranging between 9-10.  Likely multifactorial in the setting of B12 deficiency and bone marrow suppression in the setting of active alcohol use disorder.  Hemoglobin decreased from 10.4 to 9.0 to 8.3  - continue to trend - Transfuse for hemoglobin less than 7 or if hemodynamically compromised  Elevated temp Indwelling foley Today to 100.1, denies localizing symptoms, does have indwelling foley. Wbc wnl. Does not appear to be withdrawing. Does have indwelling foley, from discharge summary can't tell why it was placed - further w/u if true fever documented - d/c foley, monitor for retention   Chronic kidney disease, stage 3 unspecified ([place]) Per review, patient has a history of CKD stage IIIb - IV in the setting of type 1 diabetes. Cr relatively stable.   Type 1 diabetes mellitus with hyperglycemia (HCC) - restart tube feeds per GI - glucose elevated overnight - SSI, start semglee and mealtime   Wernicke-Korsakoff syndrome ([place]) Mentation at baseline at this time. - Continue home folic acid, thiamine and B12 supplementation   Advance Care Planning:   Code Status: Full Code.  Verified by patient's husband at bedside by previous provider  Consultants:GI     Subjective: no complaints. No cough or abd pain  Physical Exam: Vitals:   05/09/22 0402 05/09/22 0443 05/09/22 0500 05/09/22 0904  BP: 134/66 (!) 120/57  133/77  Pulse: 99 (!) 103 89 98  Resp: 18 17 18 18   Temp: 98.4 F (36.9 C) 99.6 F (37.6 C)  100.1 F (37.8 C)  TempSrc: Oral Oral  Oral  SpO2: 99% 97%  96%  Weight:        Physical Exam Constitutional:      General: She is not in acute distress.    Appearance: She is ill-appearing.  HENT:     Head: Normocephalic and atraumatic.  Eyes:     Conjunctiva/sclera: Conjunctivae normal.  Cardiovascular:     Rate and Rhythm: Normal rate and regular rhythm.     Heart sounds:  No murmur heard. Pulmonary:     Breath sounds: Normal breath sounds.  Abdominal:     General: Abdomen is flat. There is no distension.     Hernia: Right femoral hernia: pegtube present.     Comments: Pegtube tube present  Genitourinary:    Comments: Foley catheter     Data Reviewed:     Family Communication: husband updated telephonically 1/19   Advance Care Planning:   Code Status: Full Code.  Verified by patient husband  Disposition: Status is: Inpatient Remains inpatient appropriate because: hgb continues to down-trend  Planned Discharge Destination: Skilled nursing facility    Time spent: 35 minutes  Author: Desma Maxim, MD 05/09/2022 10:58 AM  For on call review [email or web address].

## 2022-05-09 NOTE — Progress Notes (Signed)
Physical Therapy Treatment Patient Details Name: Laurie Lin MRN: [id number] DOB: [birth year] Today's Date: 05/09/2022   History of Present Illness Pt is a 67 y.o. female presenting to hospital 05/06/22 with hematochezia, coffee-ground emesis, and BRBPR (returned to hospital same day discharged from hospital to SNF).  Recent hospital admission (04/17/22-05/06/22) for encephalopathy, alcohol withdrawal complicated by Wernicke-Korsakoff requiring PEG tube placement and SNF placement.  Pt now admitted with acute GI bleeding, anemia, and CKD.  PMH includes DM1, stage IIIa CKD, htn, ETOH abuse.    PT Comments    Pt resting in bed upon PT arrival; oriented to self only but agreeable to therapy.  During session pt 1-2 assist with bed mobility and close SBA to CGA with sitting balance x15 minutes on edge of bed (focusing on sitting balance and LE ex's with assist).  Pt with difficulty following cues to assist with lateral scooting on edge of bed (deferred standing attempts d/t safety concerns).  Will continue to focus on strengthening, balance, and progressive functional mobility per pt tolerance.   Recommendations for follow up therapy are one component of a multi-disciplinary discharge planning process, led by the attending physician.  Recommendations may be updated based on patient status, additional functional criteria and insurance authorization.  Follow Up Recommendations  Skilled nursing-short term rehab (<3 hours/day) Can patient physically be transported by private vehicle: No   Assistance Recommended at Discharge Frequent or constant Supervision/Assistance  Patient can return home with the following Two people to help with walking and/or transfers;Direct supervision/assist for medications management;Two people to help with bathing/dressing/bathroom;Help with stairs or ramp for entrance;Assist for transportation;Assistance with feeding;Direct supervision/assist for financial  management;Assistance with cooking/housework   Equipment Recommendations  BSC/3in1;Wheelchair (measurements PT);Wheelchair cushion (measurements PT);Hospital bed;Other (comment) (hoyer lift)    Recommendations for Other Services       Precautions / Restrictions Precautions Precautions: Fall Precaution Comments: PEG tube (with abdominal binder) Restrictions Weight Bearing Restrictions: No     Mobility  Bed Mobility Overal bed mobility: Needs Assistance Bed Mobility: Supine to Sit, Sit to Supine     Supine to sit: Max assist, HOB elevated Sit to supine: Mod assist, +2 for physical assistance, HOB elevated   General bed mobility comments: vc's for technique; assist for trunk and B LE's; 2 assist to boost pt up in bed using bed sheet end of session    Transfers Overall transfer level: Needs assistance   Transfers: Bed to chair/wheelchair/BSC            Lateral/Scoot Transfers: Total assist General transfer comment: unable to laterally scoot on edge of bed with 1 assist and max cueing (and multiple trials)    Ambulation/Gait Ambulation/Gait assistance:  (deferred)                 Stairs             Wheelchair Mobility    Modified Rankin (Stroke Patients Only)       Balance Overall balance assessment: Needs assistance Sitting-balance support: Bilateral upper extremity supported, Feet supported Sitting balance-Leahy Scale: Fair Sitting balance - Comments: steady static sitting (CGA)                                    Cognition Arousal/Alertness: Awake/alert Behavior During Therapy: Flat affect Overall Cognitive Status: Impaired/Different from baseline Area of Impairment: Orientation, Attention, Memory, Following commands, Safety/judgement, Awareness, Problem solving  Orientation Level: Disoriented to, Place, Time, Situation Current Attention Level: Focused Memory: Decreased short-term memory Following  Commands: Follows one step commands inconsistently Safety/Judgement: Decreased awareness of safety, Decreased awareness of deficits Awareness: Intellectual Problem Solving: Slow processing, Decreased initiation, Difficulty sequencing, Requires tactile cues, Requires verbal cues          Exercises General Exercises - Lower Extremity Long Arc Quad: AAROM, Strengthening, Both, 10 reps, Seated Hip Flexion/Marching: AAROM, Strengthening, Both, 10 reps, Seated    General Comments General comments (skin integrity, edema, etc.): Abdominal binder adjusted end of session; all lines/leads appearing intact.  Low back "scratch" noted--nurse assessed skin and applied dressing.  Nursing cleared pt for participation in physical therapy.  Pt agreeable to PT session.      Pertinent Vitals/Pain Pain Assessment Pain Assessment: Faces Faces Pain Scale: No hurt Pain Intervention(s): Limited activity within patient's tolerance, Monitored during session, Repositioned Vitals (HR and O2 on room air) stable and WFL throughout treatment session.    Home Living                          Prior Function            PT Goals (current goals can now be found in the care plan section) Acute Rehab PT Goals Patient Stated Goal: to improve mobility PT Goal Formulation: With patient/family Time For Goal Achievement: 05/22/22 Potential to Achieve Goals: Fair Progress towards PT goals: Progressing toward goals    Frequency    Min 2X/week      PT Plan Current plan remains appropriate    Co-evaluation              AM-PAC PT "6 Clicks" Mobility   Outcome Measure  Help needed turning from your back to your side while in a flat bed without using bedrails?: A Lot Help needed moving from lying on your back to sitting on the side of a flat bed without using bedrails?: A Lot Help needed moving to and from a bed to a chair (including a wheelchair)?: Total Help needed standing up from a chair  using your arms (e.g., wheelchair or bedside chair)?: Total Help needed to walk in hospital room?: Total Help needed climbing 3-5 steps with a railing? : Total 6 Click Score: 8    End of Session Equipment Utilized During Treatment:  (abdominal binder; B mitts) Activity Tolerance: Patient tolerated treatment well Patient left: in bed;with call bell/phone within reach;with bed alarm set;Other (comment) (B heels floating via pillow support; B mitts in place) Nurse Communication: Mobility status;Precautions PT Visit Diagnosis: Unsteadiness on feet (R26.81);Muscle weakness (generalized) (M62.81);Other abnormalities of gait and mobility (R26.89)     Time: 4010-2725 PT Time Calculation (min) (ACUTE ONLY): 30 min  Charges:  $Therapeutic Exercise: 8-22 mins $Therapeutic Activity: 8-22 mins                     Leitha Bleak, PT 05/09/22, 3:58 PM

## 2022-05-09 NOTE — Progress Notes (Signed)
Nutrition Follow-up  DOCUMENTATION CODES:   Not applicable  INTERVENTION:   Bolus feedings via PEG:    237 ml (1 carton ARC) Osmolite 1.5 6 times daily   100 ml free water flush before and after each feeding administration   Tube feeding regimen provides 2130 kcal (100% of needs), 90 grams of protein, and 1086 ml of H2O. Total free water: 2286 ml daily   Pt at high refeed risk; recommend monitor potassium, magnesium and phosphorus labs daily until stable   Daily weights    Cholecalciferol 2000 units daily via tube   NUTRITION DIAGNOSIS:   Inadequate oral intake related to dysphagia as evidenced by NPO status.  Ongoing  GOAL:   Patient will meet greater than or equal to 90% of their needs  Met with TF  MONITOR:   Labs, Weight trends, TF tolerance, Skin, I & O's  REASON FOR ASSESSMENT:   Consult Enteral/tube feeding initiation and management  ASSESSMENT:   67 y/o female with h/o IDDM, HTN, etoh abuse, GERD, hital hernia, neuropathy, PUD, cirrhosis, varices, CKD III and recent admission for encephalopathy, UTI and possible Wernickes requiring IR G-tube 1/9 for AMS/dysphagia and just discharged 1/16 and who is now re-admitted with GIB.  Reviewed I/O's: +412 ml x 24 hours and -1.3 L since admission  UOP: 1.2 L x 24 hours   Per GI notes, no plan for endoscopic evaluation at this time. EGD and colonoscopy will be considered when clinically feasible.   Per RN notes, TF were held overnight secondary to hyperglycemia (CBGS >400's). Insulin regimen has been adjusted  Per MD, plan to resume TF roday.   Medications reviewed and include vitamin D3, folic acid, reglan, and vitamin B-1.   Labs reviewed: CBGS: 241-185 (inpatient orders for glycemic control are 0-15 units insulin aspart every 4 hours, 3 units insulin aspart TID, and 10 units inuslin glargine-yfgn daily).    Diet Order:   Diet Order     None       EDUCATION NEEDS:   No education needs have been  identified at this time  Skin:  Skin Assessment: Reviewed RN Assessment  Last BM:  05/07/22  Height:   Ht Readings from Last 1 Encounters:  04/29/22 5\' 10"  (1.778 m)    Weight:   Wt Readings from Last 1 Encounters:  05/09/22 86.1 kg    Ideal Body Weight:  75.45 kg  BMI:  Body mass index is 27.24 kg/m.  Estimated Nutritional Needs:   Kcal:  1900-2200kcal/day  Protein:  95-110g/day  Fluid:  > 1.9 L    Loistine Chance, RD, LDN, [place] Registered Dietitian II Certified Diabetes Care and Education Specialist Please refer to [facility] for RD and/or RD on-call/weekend/after hours pager

## 2022-05-09 NOTE — Progress Notes (Signed)
[facility] GI inpatient brief Progress Note  No reported rebleeding, Patient still encephalopathic.  Vitals:   05/12/22 1200 05/12/22 1512  BP:  128/66  Pulse:  99  Resp: 19 18  Temp:  98.8 F (37.1 C)  SpO2:  99%       Labs:    Latest Ref Rng & Units 05/12/2022    4:55 AM 05/11/2022    5:09 AM 05/10/2022    4:20 AM  CBC  WBC 4.0 - 10.5 K/uL 10.5  9.3  10.0   Hemoglobin 12.0 - 15.0 g/dL 8.3  9.1  8.6   Hematocrit 36.0 - 46.0 % 25.8  29.4  27.1   Platelets 150 - 400 K/uL 607  706  675     CMP     Component Value Date/Time   NA 139 05/11/2022 0509   NA 142 03/29/2019 0000   K 4.4 05/11/2022 0509   CL 109 05/11/2022 0509   CO2 22 05/11/2022 0509   GLUCOSE 205 (H) 05/11/2022 0509   BUN 33 (H) 05/11/2022 0509   BUN 12 03/29/2019 0000   CREATININE 1.63 (H) 05/11/2022 0509   CALCIUM 9.0 05/11/2022 0509   PROT 6.8 05/06/2022 1450   ALBUMIN 2.5 (L) 05/06/2022 1450   AST 15 05/06/2022 1450   ALT 7 05/06/2022 1450   ALKPHOS 58 05/06/2022 1450   BILITOT 0.6 05/06/2022 1450   GFRNONAA 34 (L) 05/11/2022 0509   GFRAA 18 (L) 03/27/2017 0449      Impression:  GI bleed, self-limited, hemodynamically insignificant at present 2.    Wernicke Korsakoff psychosis 3.    Type I DM   Plan:  Continue serial H/H, exams. 2.   At this time would avoid interventional procedures given compromised mental status and therefore decreased airway protection and resultant increased sedation risk.   Will see again as needed and follow only remotely unless clinical status changes  Please call back if we can help.  Thank you  T. Madolyn Frieze, M.D. ABIM Diplomate in Gastroenterology [place] [phone] - Cell

## 2022-05-09 NOTE — Inpatient Diabetes Management (Signed)
Inpatient Diabetes Program Recommendations  AACE/ADA: New Consensus Statement on Inpatient Glycemic Control (2015)  Target Ranges:  Prepandial:   less than 140 mg/dL      [facility] postprandial:   less than 180 mg/dL (1-2 hours)      Critically ill patients:  140 - 180 mg/dL   Lab Results  Component Value Date   GLUCAP 241 (H) 05/09/2022   HGBA1C 7.2 (H) 04/18/2022    Review of Glycemic Control  Latest Reference Range & Units 05/08/22 08:03 05/08/22 11:37 05/08/22 17:16 05/08/22 18:39 05/08/22 20:34 05/08/22 23:16 05/09/22 04:05 05/09/22 08:10  Glucose-Capillary 70 - 99 mg/dL 274 (H) 309 (H) 439 (H) 388 (H) 252 (H) 103 (H) 260 (H) 241 (H)  (H): Data is abnormally high  Diabetes history: DM1(does not make insulin.  Needs correction, basal and meal coverage) Outpatient Diabetes medications:  Basaglar 12 units QHS Novolog 0-9 units TID and 5 units TID with meals  Current orders for Inpatient glycemic control:  Novolog 0-9 units Q4H   Inpatient Diabetes Program Recommendations:     Semglee 10 units QHS (80% of home dose) Once eating Novolog 3 units TID with meals if she consumes at least 50%   Will continue to follow while inpatient.   Thank you, Reche Dixon, MSN, [place] Diabetes Coordinator Inpatient Diabetes Program [phone] (team pager from 8a-5p)

## 2022-05-09 NOTE — Progress Notes (Signed)
CROSS COVER NOTE  NAME: Laurie Lin MRN: [id number] DOB : [birth year]  HPI/Events of Note   Nurse reported tachycardia with short run of SVT  and decreased urine output as well as high blood sugars  Assessment and  Interventions   Assessment:  Plan: 250cc bolus LR Cbg coverage change to moderate dose        Kathlene Cote NP [facility]

## 2022-05-09 NOTE — NC FL2 (Signed)
[place] LEVEL OF CARE FORM     IDENTIFICATION  Patient Name: Laurie Lin Birthdate: Nov 03, 1955 Sex: female Admission Date (Current Location): 05/06/2022  [facility] and [place] Number:  Engineering geologist and Address:  [facility], [address] [place], [place] [address]      Provider Number: [id number]  Attending Physician Name and Address:  Gwynne Edinger, MD  Relative Name and Phone Number:       Current Level of Care: Hospital Recommended Level of Care: [facility] [facility] Prior Approval Number:    Date Approved/Denied:   PASRR Number: [id number]  Discharge Plan: SNF    Current Diagnoses: Patient Active Problem List   Diagnosis Date Noted   Acute GI bleeding 05/06/2022   Aspiration pneumonia ([place]) 04/25/2022   Overweight (BMI 25.0-29.9) 04/19/2022   Encephalopathy acute 04/18/2022   Alcohol abuse 04/18/2022   GERD without esophagitis 04/18/2022   Acute renal failure superimposed on stage 3a chronic kidney disease ([place]) 04/18/2022   Wernicke-Korsakoff syndrome ([place]) 04/18/2022   Trochanteric bursitis of left hip 01/17/2020   Anemia in chronic kidney disease 11/03/2019   Chronic kidney disease, stage 3 unspecified ([place]) 11/03/2019   Burn of second degree of left foot, initial encounter 12/28/2018   Neurogenic pain 09/30/2017   Anemia 09/30/2017   Hyponatremia 09/30/2017   Hypocalcemia 09/30/2017   Hypoalbuminemia 09/30/2017   Diabetes mellitus with stage 4 chronic kidney disease GFR 15-29 ([place]) 09/30/2017   Diabetic peripheral neuropathy ([place]) 09/30/2017   Chronic pain of hip (B) 09/30/2017   Chronic sacroiliac joint pain (B) 09/30/2017   Lumbar facet joint syndrome 09/30/2017   H/O diabetic foot ulcer 09/09/2017   Chronic low back pain (Primary Area of Pain) (Bilateral) (R>L) w/ sciatica (Bilateral) 09/09/2017   Chronic lower extremity pain (Secondary Area of Pain) (Bilateral) (R>L) 09/09/2017    Chronic pain syndrome 09/09/2017   Long term current use of opiate analgesic 09/09/2017   Pharmacologic therapy 09/09/2017   Disorder of skeletal system 09/09/2017   Problems influencing health status 09/09/2017   Other cirrhosis of liver ([place]) 06/03/2017   Chronic lumbar radiculopathy 06/03/2017   Chronic neck pain 06/03/2017   Type 1 diabetes mellitus with hyperglycemia ([place]) 04/30/2017   Atrial flutter ([place]) 04/30/2017   Neuropathy 04/30/2017   Hiatal hernia 04/30/2017   Cataracts, bilateral 04/30/2017   Albuminuria 04/30/2017   DKA (diabetic ketoacidoses) 03/21/2017   Reactive depression 01/03/2015   Essential hypertension 09/28/2014   Hepatic steatosis 08/17/2013   Elevated liver enzymes 08/17/2013   Abnormal levels of other serum enzymes 08/17/2013    Orientation RESPIRATION BLADDER Height & Weight     Self  Normal Incontinent, Indwelling catheter Weight: 189 lb 13.1 oz (86.1 kg) Height:     BEHAVIORAL SYMPTOMS/MOOD NEUROLOGICAL BOWEL NUTRITION STATUS  Other (Comment) (Flat affect)  (None) Incontinent Feeding tube (PEG)  AMBULATORY STATUS COMMUNICATION OF NEEDS Skin   Extensive Assist Verbally Skin abrasions, Bruising, Other (Comment) (Erythema/redness, rash, scratch marks.)                       Personal Care Assistance Level of Assistance  Bathing, Feeding, Dressing Bathing Assistance: Maximum assistance Feeding assistance: Maximum assistance (PEG tube) Dressing Assistance: Maximum assistance     Functional Limitations Info  Sight, Hearing, Speech Sight Info: Adequate Hearing Info: Adequate Speech Info: Adequate    SPECIAL CARE FACTORS FREQUENCY  PT (By licensed PT), OT (By licensed OT)  PT Frequency: 5 x week OT Frequency: 5 x week            Contractures Contractures Info: Not present    Additional Factors Info  Code Status, Allergies Code Status Info: Full code Allergies Info: Ibuprofen, Nsaids, Brimonidine, Latex            Current Medications (05/09/2022):  This is the current hospital active medication list Current Facility-Administered Medications  Medication Dose Route Frequency Provider Last Rate Last Admin   acetaminophen (TYLENOL) 160 MG/5ML solution 650 mg  650 mg Per Tube Q6H PRN Jose Persia, MD       Chlorhexidine Gluconate Cloth 2 % PADS 6 each  6 each Topical Daily Nsiah, Oris Drone, MD   6 each at 05/09/22 0840   cholecalciferol (VITAMIN D3) 25 MCG (1000 UNIT) tablet 2,000 Units  2,000 Units Per Tube Daily Noralee Space, RPH   2,000 Units at 05/09/22 0840   cyanocobalamin (VITAMIN B12) tablet 1,000 mcg  1,000 mcg Per Tube Daily Jose Persia, MD   1,000 mcg at 05/09/22 0840   feeding supplement (OSMOLITE 1.5 CAL) liquid 237 mL  237 mL Per Tube 6 X Daily Nsiah, Oris Drone, MD   237 mL at 22/97/98 9211   folic acid (FOLVITE) tablet 1 mg  1 mg Per Tube Daily Jose Persia, MD   1 mg at 05/09/22 9417   free water 100 mL  100 mL Per Tube 6 X Daily Nsiah, Oris Drone, MD   100 mL at 05/09/22 0840   hydrOXYzine (ATARAX) tablet 10 mg  10 mg Per Tube TID PRN Jose Persia, MD       insulin aspart (novoLOG) injection 0-15 Units  0-15 Units Subcutaneous Q4H Sharion Settler, NP   5 Units at 05/09/22 0840   loperamide HCl (IMODIUM) 1 MG/7.5ML suspension 2 mg  2 mg Per Tube PRN Jose Persia, MD       magnesium hydroxide (MILK OF MAGNESIA) suspension 30 mL  30 mL Per Tube Daily PRN Jose Persia, MD       metoCLOPramide (REGLAN) 10 MG/10ML solution 5 mg  5 mg Per Tube TID AC Jose Persia, MD   5 mg at 05/09/22 0840   multivitamin with minerals tablet 1 tablet  1 tablet Per Tube Daily Jose Persia, MD   1 tablet at 05/09/22 0839   Netarsudil Dimesylate 0.02 % SOLN 1 drop  1 drop Both Eyes QPM Jose Persia, MD   1 drop at 05/08/22 1722   pantoprazole (PROTONIX) injection 40 mg  40 mg Intravenous Q12H Jose Persia, MD   40 mg at 05/09/22 0839   polyethylene glycol (MIRALAX / GLYCOLAX) packet 17  g  17 g Per Tube Daily PRN Jose Persia, MD       sodium chloride flush (NS) 0.9 % injection 3 mL  3 mL Intravenous Q12H Jose Persia, MD   3 mL at 05/09/22 0841   thiamine (VITAMIN B1) tablet 100 mg  100 mg Per Tube Daily Jose Persia, MD   100 mg at 05/09/22 0840   Travoprost (BAK Free) (TRAVATAN) 0.004 % ophthalmic solution SOLN 1 drop  1 drop Both Eyes QHS Rito Ehrlich A, RPH   1 drop at 05/08/22 2035     Discharge Medications: Please see discharge summary for a list of discharge medications.  Relevant Imaging Results:  Relevant Lab Results:   Additional Information SS#: [id number]  Candie Chroman, LCSW

## 2022-05-10 DIAGNOSIS — K922 Gastrointestinal hemorrhage, unspecified: Secondary | ICD-10-CM | POA: Diagnosis not present

## 2022-05-10 LAB — BASIC METABOLIC PANEL
Anion gap: 7 (ref 5–15)
BUN: 33 mg/dL — ABNORMAL HIGH (ref 8–23)
CO2: 22 mmol/L (ref 22–32)
Calcium: 8.9 mg/dL (ref 8.9–10.3)
Chloride: 112 mmol/L — ABNORMAL HIGH (ref 98–111)
Creatinine, Ser: 1.68 mg/dL — ABNORMAL HIGH (ref 0.44–1.00)
GFR, Estimated: 33 mL/min — ABNORMAL LOW (ref >60)
Glucose, Bld: 79 mg/dL (ref 70–99)
Potassium: 4.4 mmol/L (ref 3.5–5.1)
Sodium: 141 mmol/L (ref 135–145)

## 2022-05-10 LAB — GLUCOSE, CAPILLARY
Glucose-Capillary: 183 mg/dL — ABNORMAL HIGH (ref 70–99)
Glucose-Capillary: 224 mg/dL — ABNORMAL HIGH (ref 70–99)
Glucose-Capillary: 259 mg/dL — ABNORMAL HIGH (ref 70–99)
Glucose-Capillary: 269 mg/dL — ABNORMAL HIGH (ref 70–99)
Glucose-Capillary: 273 mg/dL — ABNORMAL HIGH (ref 70–99)
Glucose-Capillary: 93 mg/dL (ref 70–99)

## 2022-05-10 LAB — CBC
HCT: 27.1 % — ABNORMAL LOW (ref 36.0–46.0)
Hemoglobin: 8.6 g/dL — ABNORMAL LOW (ref 12.0–15.0)
MCH: 31.4 pg (ref 26.0–34.0)
MCHC: 31.7 g/dL (ref 30.0–36.0)
MCV: 98.9 fL (ref 80.0–100.0)
Platelets: 675 10*3/uL — ABNORMAL HIGH (ref 150–400)
RBC: 2.74 MIL/uL — ABNORMAL LOW (ref 3.87–5.11)
RDW: 13 % (ref 11.5–15.5)
WBC: 10 10*3/uL (ref 4.0–10.5)
nRBC: 0 % (ref 0.0–0.2)

## 2022-05-10 LAB — PHOSPHORUS: Phosphorus: 3.5 mg/dL (ref 2.5–4.6)

## 2022-05-10 LAB — MAGNESIUM: Magnesium: 1.9 mg/dL (ref 1.7–2.4)

## 2022-05-10 NOTE — Plan of Care (Signed)

## 2022-05-10 NOTE — Progress Notes (Signed)
PROGRESS NOTE    Laurie Lin  BZJ:[id number]  DOB: Nov 24, 1955  DOA: 05/06/2022 PCP: Merryl Hacker, No Outpatient Specialists:   Hospital course:  67 year old with recent prolonged hospitalization for alcohol withdrawal complicated by  Laurie Lin encephalopathy requiring PEG tube placement.  Patient was sent back to ED from SNF for coffee-ground emesis and hematochezia.   Subjective:  Patient states she feels okay.  Admits she is somewhat confused.  Has no nausea at present.  Denies abdominal or chest pain.   Objective: Vitals:   05/09/22 1546 05/09/22 2032 05/10/22 0341 05/10/22 0854  BP: 122/65 135/71 (!) 154/76 134/65  Pulse: 94 (!) 103 (!) 104 (!) 102  Resp: 17 18 20 18   Temp: 99 F (37.2 C) 98.7 F (37.1 C) 98.6 F (37 C) 98.4 F (36.9 C)  TempSrc: Oral   Oral  SpO2: 96% 97% 100% 97%  Weight:       No intake or output data in the 24 hours ending 05/10/22 1233 Filed Weights   05/07/22 1536 05/08/22 0422 05/09/22 0356  Weight: 87.7 kg 87.2 kg 86.1 kg     Exam:  General: Somewhat confused appearing female lying in bed getting tube feeds from attentive RN Eyes: sclera anicteric, conjuctiva mild injection bilaterally CVS: S1-S2, regular  Respiratory:  decreased air entry bilaterally secondary to decreased inspiratory effort, rales at bases  GI: NABS, soft, NT  LE: Warm and well-perfused  Data Reviewed:  Basic Metabolic Panel: Recent Labs  Lab 05/06/22 1450 05/07/22 0444 05/08/22 0516 05/09/22 0459 05/10/22 0420  NA 139 139 143 138 141  K 5.3* 4.8 4.8 4.7 4.4  CL 106 110 114* 112* 112*  CO2 22 19* 22 20* 22  GLUCOSE 187* 121* 207* 316* 79  BUN 32* 29* 31* 29* 33*  CREATININE 1.79* 1.75* 1.82* 1.84* 1.68*  CALCIUM 8.8* 8.8* 8.8* 8.3* 8.9  MG  --   --  1.8 1.7 1.9  PHOS  --   --  4.8* 4.4 3.5    CBC: Recent Labs  Lab 05/06/22 2216 05/07/22 0444 05/08/22 0515 05/09/22 0459 05/10/22 0420  WBC 13.0* 11.1* 8.2 8.9 10.0  HGB 8.9* 9.0* 9.0*  8.3* 8.6*  HCT 28.2* 28.4* 29.0* 26.0* 27.1*  MCV 100.4* 100.0 101.0* 99.2 98.9  PLT 758* 717* 756* 684* 675*     Scheduled Meds:  Chlorhexidine Gluconate Cloth  6 each Topical Daily   cholecalciferol  2,000 Units Per Tube Daily   cyanocobalamin  1,000 mcg Per Tube Daily   feeding supplement (OSMOLITE 1.5 CAL)  237 mL Per Tube 6 X Daily   folic acid  1 mg Per Tube Daily   free water  200 mL Per Tube 6 X Daily   insulin aspart  0-15 Units Subcutaneous Q4H   insulin aspart  3 Units Subcutaneous TID WC   insulin glargine-yfgn  10 Units Subcutaneous QHS   metoCLOPramide  5 mg Per Tube TID AC   multivitamin with minerals  1 tablet Per Tube Daily   Netarsudil Dimesylate  1 drop Both Eyes QPM   pantoprazole (PROTONIX) IV  40 mg Intravenous Q12H   sodium chloride flush  3 mL Intravenous Q12H   thiamine  100 mg Per Tube Daily   Travoprost (BAK Free)  1 drop Both Eyes QHS   Continuous Infusions:   Assessment & Plan:   Acute GIB Admitted with coffee-ground emesis and BRBPR Seen by GI who are recommending conservative management Patient has remained hemodynamically stable with  stable H&H Tube feeds have been restarted today Continue Protonix 40 IV twice daily Possible discharge tomorrow if patient remains hemodynamically stable with stable H&H Patient getting cc free water 6 times a day  Low-grade fever Fever with Tmax 100.1 yesterday, down to 99 today Leukocytosis continues to improve Foley was discontinued Patient is voiding well without evidence of retention  DM 1 Blood sugars somewhat labile ranging from 93 up to 374 I will not make any changes in her insulin regimen at present  CKD 3 Creatinine is stable  Warnicke Lin syndrome Mental status is apparently at baseline Continue folic acid, thiamine and B12         Studies: No results found.  Principal Problem:   Acute GI bleeding Active Problems:   Anemia   Chronic kidney disease, stage 3 unspecified  ([place])   Type 1 diabetes mellitus with hyperglycemia ([place])   Wernicke-Lin syndrome ([place])     Laurie Lin, [facility]  If 7PM-7AM, please contact night-coverage [email or web address]   LOS: 4 days

## 2022-05-11 DIAGNOSIS — K922 Gastrointestinal hemorrhage, unspecified: Secondary | ICD-10-CM | POA: Diagnosis not present

## 2022-05-11 LAB — GLUCOSE, CAPILLARY
Glucose-Capillary: 181 mg/dL — ABNORMAL HIGH (ref 70–99)
Glucose-Capillary: 265 mg/dL — ABNORMAL HIGH (ref 70–99)
Glucose-Capillary: 229 mg/dL — ABNORMAL HIGH (ref 70–99)
Glucose-Capillary: 224 mg/dL — ABNORMAL HIGH (ref 70–99)
Glucose-Capillary: 320 mg/dL — ABNORMAL HIGH (ref 70–99)
Glucose-Capillary: 187 mg/dL — ABNORMAL HIGH (ref 70–99)

## 2022-05-11 LAB — BASIC METABOLIC PANEL
Anion gap: 8 (ref 5–15)
BUN: 33 mg/dL — ABNORMAL HIGH (ref 8–23)
CO2: 22 mmol/L (ref 22–32)
Calcium: 9 mg/dL (ref 8.9–10.3)
Chloride: 109 mmol/L (ref 98–111)
Creatinine, Ser: 1.63 mg/dL — ABNORMAL HIGH (ref 0.44–1.00)
GFR, Estimated: 34 mL/min — ABNORMAL LOW (ref >60)
Glucose, Bld: 205 mg/dL — ABNORMAL HIGH (ref 70–99)
Potassium: 4.4 mmol/L (ref 3.5–5.1)
Sodium: 139 mmol/L (ref 135–145)

## 2022-05-11 LAB — CBC
HCT: 29.4 % — ABNORMAL LOW (ref 36.0–46.0)
Hemoglobin: 9.1 g/dL — ABNORMAL LOW (ref 12.0–15.0)
MCH: 31.5 pg (ref 26.0–34.0)
MCHC: 31 g/dL (ref 30.0–36.0)
MCV: 101.7 fL — ABNORMAL HIGH (ref 80.0–100.0)
Platelets: 706 10*3/uL — ABNORMAL HIGH (ref 150–400)
RBC: 2.89 MIL/uL — ABNORMAL LOW (ref 3.87–5.11)
RDW: 13 % (ref 11.5–15.5)
WBC: 9.3 10*3/uL (ref 4.0–10.5)
nRBC: 0 % (ref 0.0–0.2)

## 2022-05-11 NOTE — Plan of Care (Signed)

## 2022-05-11 NOTE — Progress Notes (Signed)
PROGRESS NOTE    ELINA STRENG  JGO:[id number]  DOB: 1956-04-09  DOA: 05/06/2022 PCP: Merryl Hacker, No Outpatient Specialists:   Hospital course:  67 year old with recent prolonged hospitalization for alcohol withdrawal complicated by  [name] Korsakoff encephalopathy requiring PEG tube placement.  Patient was sent back to ED from SNF for coffee-ground emesis and hematochezia.   Subjective:  Patient states she feels much better.  Has no complaints   Objective: Vitals:   05/10/22 2046 05/11/22 0300 05/11/22 0517 05/11/22 0752  BP: 134/65  138/76 138/69  Pulse: (!) 109  99 99  Resp: 16  17 16   Temp: 99.1 F (37.3 C)  98.2 F (36.8 C) 98.2 F (36.8 C)  TempSrc:      SpO2: 99%  99% 100%  Weight:  88.4 kg      Intake/Output Summary (Last 24 hours) at 05/11/2022 1129 Last data filed at 05/11/2022 0533 Gross per 24 hour  Intake 874 ml  Output 400 ml  Net 474 ml   Filed Weights   05/08/22 0422 05/09/22 0356 05/11/22 0300  Weight: 87.2 kg 86.1 kg 88.4 kg     Exam:  General: Patient appears much improved, less confused, cooperative with RN at bedside Eyes: sclera anicteric, conjuctiva mild injection bilaterally CVS: S1-S2, regular  Respiratory:  decreased air entry bilaterally secondary to decreased inspiratory effort, rales at bases  GI: NABS, soft, NT  LE: Warm and well-perfused  Data Reviewed:  Basic Metabolic Panel: Recent Labs  Lab 05/07/22 0444 05/08/22 0516 05/09/22 0459 05/10/22 0420 05/11/22 0509  NA 139 143 138 141 139  K 4.8 4.8 4.7 4.4 4.4  CL 110 114* 112* 112* 109  CO2 19* 22 20* 22 22  GLUCOSE 121* 207* 316* 79 205*  BUN 29* 31* 29* 33* 33*  CREATININE 1.75* 1.82* 1.84* 1.68* 1.63*  CALCIUM 8.8* 8.8* 8.3* 8.9 9.0  MG  --  1.8 1.7 1.9  --   PHOS  --  4.8* 4.4 3.5  --      CBC: Recent Labs  Lab 05/07/22 0444 05/08/22 0515 05/09/22 0459 05/10/22 0420 05/11/22 0509  WBC 11.1* 8.2 8.9 10.0 9.3  HGB 9.0* 9.0* 8.3* 8.6* 9.1*  HCT  28.4* 29.0* 26.0* 27.1* 29.4*  MCV 100.0 101.0* 99.2 98.9 101.7*  PLT 717* 756* 684* 675* 706*      Scheduled Meds:  Chlorhexidine Gluconate Cloth  6 each Topical Daily   cholecalciferol  2,000 Units Per Tube Daily   cyanocobalamin  1,000 mcg Per Tube Daily   feeding supplement (OSMOLITE 1.5 CAL)  237 mL Per Tube 6 X Daily   folic acid  1 mg Per Tube Daily   free water  200 mL Per Tube 6 X Daily   insulin aspart  0-15 Units Subcutaneous Q4H   insulin aspart  3 Units Subcutaneous TID WC   insulin glargine-yfgn  10 Units Subcutaneous QHS   metoCLOPramide  5 mg Per Tube TID AC   multivitamin with minerals  1 tablet Per Tube Daily   Netarsudil Dimesylate  1 drop Both Eyes QPM   pantoprazole (PROTONIX) IV  40 mg Intravenous Q12H   sodium chloride flush  3 mL Intravenous Q12H   thiamine  100 mg Per Tube Daily   Travoprost (BAK Free)  1 drop Both Eyes QHS   Continuous Infusions:   Assessment & Plan:   Acute GIB Admitted with coffee-ground emesis and BRBPR Seen by GI who recommended conservative management Patient has remained hemodynamically  stable with stable H&H Tube feeds restarted yesterday Continue Protonix 40 twice daily--can be switched to oral upon discharge Patient is stable to be discharged today however her SNF does not accept patients back on Sunday, authorization procedure has been initiated by [facility] for hopefully discharge tomorrow.  Low-grade fever Fever with Tmax 100.1 yesterday, down to 99 today Leukocytosis continues to improve Foley was discontinued Patient is voiding well without evidence of retention, postvoid residual was 40  DM 1 Improved but not optimal blood sugar control Continue present regimen  CKD 3 Creatinine is stable  Warnicke Korsakoff syndrome Mental status is apparently at baseline Continue folic acid, thiamine and B12         Studies: No results found.  Principal Problem:   Acute GI bleeding Active Problems:   Anemia    Chronic kidney disease, stage 3 unspecified ([place])   Type 1 diabetes mellitus with hyperglycemia ([place])   Wernicke-Korsakoff syndrome ([place])     Aviendha Azbell Tublu Naijah Lacek, [facility]  If 7PM-7AM, please contact night-coverage [email or web address]   LOS: 5 days

## 2022-05-11 NOTE — TOC Progression Note (Addendum)
Transition of Care [facility]) - Progression Note    Patient Details  Name: Laurie Lin MRN: [id number] Date of Birth: 1955/10/21  Transition [facility]) CM/SW [place], LCSW Phone Number: 05/11/2022, 10:51 AM  Clinical Narrative:    Notified by MD that patient is medically ready to return to SNF [facility].  CSW started insurance auth in [place] notified [facility] and inquired if they can take patient tomorrow pending insurance auth, waiting on response.   12:57- [facility] confirmed patient can return on 1/22 pending insurance auth.    Expected Discharge Plan: [place] Barriers to Discharge: Continued Medical Work up  Expected Discharge Plan and Services     Post Acute Care Choice: Resumption of Svcs/PTA Provider Living arrangements for the past 2 months: Single Family Home                                       Social Determinants of Health (SDOH) Interventions SDOH Screenings   Food Insecurity: No Food Insecurity (05/09/2022)  Housing: Low Risk  (05/09/2022)  Transportation Needs: No Transportation Needs (05/09/2022)  Utilities: Not At Risk (05/09/2022)  Tobacco Use: Low Risk  (05/09/2022)    Readmission Risk Interventions    05/07/2022    9:48 AM  Readmission Risk Prevention Plan  Transportation Screening Complete  PCP or Specialist Appt within 3-5 Days Complete  Social Work Consult for [place] Planning/Counseling [place] Not Applicable  Medication Review Press photographer) Complete

## 2022-05-12 ENCOUNTER — Encounter: Payer: Self-pay | Admitting: Internal Medicine

## 2022-05-12 DIAGNOSIS — K922 Gastrointestinal hemorrhage, unspecified: Secondary | ICD-10-CM | POA: Diagnosis not present

## 2022-05-12 LAB — GLUCOSE, CAPILLARY
Glucose-Capillary: 171 mg/dL — ABNORMAL HIGH (ref 70–99)
Glucose-Capillary: 235 mg/dL — ABNORMAL HIGH (ref 70–99)
Glucose-Capillary: 356 mg/dL — ABNORMAL HIGH (ref 70–99)

## 2022-05-12 LAB — CBC
HCT: 25.8 % — ABNORMAL LOW (ref 36.0–46.0)
Hemoglobin: 8.3 g/dL — ABNORMAL LOW (ref 12.0–15.0)
MCH: 31.7 pg (ref 26.0–34.0)
MCHC: 32.2 g/dL (ref 30.0–36.0)
MCV: 98.5 fL (ref 80.0–100.0)
Platelets: 607 10*3/uL — ABNORMAL HIGH (ref 150–400)
RBC: 2.62 MIL/uL — ABNORMAL LOW (ref 3.87–5.11)
RDW: 12.9 % (ref 11.5–15.5)
WBC: 10.5 10*3/uL (ref 4.0–10.5)
nRBC: 0 % (ref 0.0–0.2)

## 2022-05-12 MED ORDER — INSULIN ASPART 100 UNIT/ML IJ SOLN: 3.0000 [IU] | Freq: Every day | INTRAMUSCULAR | Status: DC

## 2022-05-12 MED ORDER — VITAMIN D3 25 MCG PO TABS: 2000.0000 [IU] | ORAL_TABLET | Freq: Every day | ORAL | Status: DC

## 2022-05-12 MED ORDER — INSULIN ASPART 100 UNIT/ML IJ SOLN
3.0000 [IU] | Freq: Every day | INTRAMUSCULAR | Status: DC
  Administered 2022-05-12 (×2): 3 [IU] via SUBCUTANEOUS
  Filled 2022-05-12 (×2): qty 1

## 2022-05-12 MED ORDER — INSULIN ASPART 100 UNIT/ML IJ SOLN
0.0000 [IU] | Freq: Three times a day (TID) | INTRAMUSCULAR | Status: DC
  Administered 2022-05-12: 9 [IU] via SUBCUTANEOUS
  Administered 2022-05-12: 3 [IU] via SUBCUTANEOUS
  Filled 2022-05-12 (×2): qty 1

## 2022-05-12 MED ORDER — PANTOPRAZOLE SODIUM 40 MG PO PACK: 40.0000 mg | PACK | Freq: Two times a day (BID) | ORAL | Status: DC

## 2022-05-12 MED ORDER — ADULT MULTIVITAMIN W/MINERALS CH: 1.0000 | ORAL_TABLET | Freq: Every day | ORAL | Status: DC

## 2022-05-12 MED ORDER — INSULIN ASPART 100 UNIT/ML IJ SOLN
0.0000 [IU] | INTRAMUSCULAR | Status: DC
  Administered 2022-05-12 (×2): 3 [IU] via SUBCUTANEOUS
  Filled 2022-05-12 (×2): qty 1

## 2022-05-12 MED ORDER — INSULIN ASPART 100 UNIT/ML IJ SOLN: 0.0000 [IU] | Freq: Three times a day (TID) | INTRAMUSCULAR | Status: DC

## 2022-05-12 MED ORDER — FREE WATER: 200.0000 mL | Freq: Every day | Status: DC

## 2022-05-12 NOTE — TOC Transition Note (Addendum)
Transition of Care [facility]) - CM/SW Discharge Note   Patient Details  Name: Laurie Lin MRN: [id number] Date of Birth: Jul 30, 1955  Transition of Care [facility]) CM/SW Contact:  Candie Chroman, LCSW Phone Number: 05/12/2022, 10:58 AM   Clinical Narrative:  Patient has orders to discharge to [facility] today. RN will call report to [phone] (Room 701 but may have to move to room 808 per admissions assistant). EMS transport arranged for 3:00. No further concerns. CSW signing off.   1:30 pm: SNF did not have enough Osmolite so they will order. Dietician provided case of Osmolite to go with her.   Final next level of care: [place] Barriers to Discharge: Barriers Resolved   Patient Goals and CMS Choice   Choice offered to / list presented to : Spouse  Discharge Placement     Existing PASRR number confirmed : 05/09/22          Patient chooses bed at: [facility] [place] Patient to be transferred to facility by: EMS Name of family member notified: Pearle Wandler Patient and family notified of of transfer: 05/12/22  Discharge Plan and Services Additional resources added to the After Visit Summary for       Post Acute Care Choice: Resumption of Svcs/PTA Provider                               Social Determinants of Health (SDOH) Interventions SDOH Screenings   Food Insecurity: No Food Insecurity (05/09/2022)  Housing: Low Risk  (05/09/2022)  Transportation Needs: No Transportation Needs (05/09/2022)  Utilities: Not At Risk (05/09/2022)  Tobacco Use: Low Risk  (05/09/2022)     Readmission Risk Interventions    05/07/2022    9:48 AM  Readmission Risk Prevention Plan  Transportation Screening Complete  PCP or Specialist Appt within 3-5 Days Complete  Social Work Consult for [place] Planning/Counseling [place] Not Applicable  Medication Review Press photographer) Complete

## 2022-05-12 NOTE — Discharge Summary (Addendum)
Physician Discharge Summary   SMERA GUYETTE  female DOB: 08-17-1955  RCV:[id number]  PCP: Pcp, No  Admit date: 05/06/2022 Discharge date: 05/12/2022  Admitted From: SNF Disposition:  SNF CODE STATUS: Full code   Hospital Course:  For full details, please see H&P, progress notes, consult notes and ancillary notes.  Briefly,  CHANTRELL APSEY is a 67 year old with recent prolonged hospitalization for alcohol withdrawal complicated by [name] Korsakoff encephalopathy requiring PEG tube placement.  Patient was sent back to ED from SNF for coffee-ground emesis and hematochezia.   Acute GIB Admitted with coffee-ground emesis and BRBPR, unclear source. Seen by GI who recommended conservative management. Patient has remained hemodynamically stable with stable H&H around 8's-9's. --cont liquid protonix BID --outpatient f/u with GI Dr. Gilles Chiquito feed dependent --tube feed order as below.  --Per last SLP eval on 05/02/22, pt can have Ice chips PRN after oral care   DM 1 --discharged on home Basaglar 12u nightly, 3u short-acting Novolog 6 times per day with bolus tube feed, and correction insulin TID.   CKD 3b Creatinine is stable   Warnicke Korsakoff syndrome Mental status is apparently at baseline Continue folic acid, thiamine and B12  HTN --home Lisinopril and torsemide (PRN for edema) were not ordered during this hospitalization.  BP has been controlled.  Both d/c'ed at discharge.  Possible urinary retention --Pt was voiding fine until the day of discharge where she required 1 In/Out cath for retention of about 500 ml urine.  Possibly due to constipation which was resolved.  Please perform bladder scan q6h for 1 day and if continue to have urinary retention, then may need to insert Foley.    Discharge Diagnoses:  Principal Problem:   Acute GI bleeding Active Problems:   Anemia   Chronic kidney disease, stage 3 unspecified (HCC)   Type 1 diabetes mellitus with  hyperglycemia ([place])   Wernicke-Korsakoff syndrome ([place])   30 Day Unplanned Readmission Risk Score    Flowsheet Row ED to Hosp-Admission (Current) from 05/06/2022 in [facility]  30 Day Unplanned Readmission Risk Score (%) 27.64 Filed at 05/12/2022 0801       This score is the patient's risk of an unplanned readmission within 30 days of being discharged (0 -100%). The score is based on dignosis, age, lab data, medications, orders, and past utilization.   Low:  0-14.9   Medium: 15-21.9   High: 22-29.9   Extreme: 30 and above         Discharge Instructions:  Allergies as of 05/12/2022       Reactions   Ibuprofen Nausea Only   Nsaids    Ulcerative stomach and small intestines   Brimonidine Itching   Red, itchy, sensitivity to light Red, itchy, sensitivity to light   Latex Itching   Other reaction(s): Other (see comments)        Medication List     STOP taking these medications    esomeprazole 40 MG capsule Commonly known as: NEXIUM   feeding supplement (PROSource TF20) liquid   insulin aspart 100 UNIT/ML injection Commonly known as: novoLOG Replaced by: insulin aspart 100 UNIT/ML injection You also have another medication with the same name that you need to continue taking as instructed.   lisinopril 2.5 MG tablet Commonly known as: ZESTRIL   tiZANidine 4 MG capsule Commonly known as: Zanaflex   torsemide 20 MG tablet Commonly known as: DEMADEX       TAKE these  medications    acetaminophen 500 MG tablet Commonly known as: TYLENOL Take 500 mg by mouth 2 (two) times daily as needed.   Basaglar KwikPen 100 UNIT/ML Inject 12 Units into the skin at bedtime.   cyanocobalamin 1000 MCG tablet Place 1 tablet (1,000 mcg total) into feeding tube daily.   Dexcom G6 Sensor Misc by Does not apply route.   feeding supplement (OSMOLITE 1.5 CAL) Liqd Place 237 mLs into feeding tube 6 (six) times daily.   folic acid 1 MG  tablet Commonly known as: FOLVITE Place 1 tablet (1 mg total) into feeding tube daily.   free water Soln Place 200 mLs into feeding tube 6 (six) times daily. What changed: how much to take   hydrOXYzine 10 MG tablet Commonly known as: ATARAX Place 1 tablet (10 mg total) into feeding tube 3 (three) times daily as needed for itching.   insulin aspart 100 UNIT/ML injection Commonly known as: novoLOG Inject 0-9 Units into the skin 3 (three) times daily. CBG 70 - 120: 0 units CBG 121 - 150: 1 unit CBG 151 - 200: 2 units CBG 201 - 250: 3 units CBG 251 - 300: 5 units CBG 301 - 350: 7 units CBG 351 - 400: 9 units What changed:  how much to take when to take this additional instructions Another medication with the same name was removed. Continue taking this medication, and follow the directions you see here.   insulin aspart 100 UNIT/ML injection Commonly known as: novoLOG Inject 3 Units into the skin 6 (six) times daily. With bolus tube feed. What changed: You were already taking a medication with the same name, and this prescription was added. Make sure you understand how and when to take each. Replaces: insulin aspart 100 UNIT/ML injection   Insulin Pen Needle 32G X 4 MM Misc ok to sub any brand or size needle preferred by insurance/patient, use up to 5x/day, dx E10.65   loperamide HCl 1 MG/7.5ML suspension Commonly known as: IMODIUM Place 15 mLs (2 mg total) into feeding tube as needed for diarrhea or loose stools.   magnesium hydroxide 400 MG/5ML suspension Commonly known as: MILK OF MAGNESIA Place 30 mLs into feeding tube daily as needed for mild constipation.   metoCLOPramide 5 MG/5ML solution Commonly known as: REGLAN Place 5 mLs (5 mg total) into feeding tube 3 (three) times daily before meals.   multivitamin with minerals Tabs tablet Place 1 tablet into feeding tube daily. What changed: how to take this   OneTouch Delica Lancets 13K Misc 1 Device by Does not  apply route 4 (four) times daily.   pantoprazole sodium 40 mg Commonly known as: PROTONIX Place 40 mg into feeding tube 2 (two) times daily.   Rhopressa 0.02 % Soln Generic drug: Netarsudil Dimesylate Place 1 drop into both eyes every evening.   thiamine 100 MG tablet Commonly known as: VITAMIN B1 Place 1 tablet (100 mg total) into feeding tube daily.   Travoprost (BAK Free) 0.004 % Soln ophthalmic solution Commonly known as: TRAVATAN Place 1 drop into both eyes at bedtime.   vitamin D3 25 MCG tablet Commonly known as: CHOLECALCIFEROL Place 2 tablets (2,000 Units total) into feeding tube daily.         Contact information for follow-up providers     Efrain Sella, MD Follow up in 2 week(s).   Specialty: Gastroenterology Contact information: [address] [place] [address] [phone]  Contact information for after-discharge care     Destination     [facility] Preferred SNF .   Service: Skilled Nursing Contact information: [place] [address] [phone]                     Allergies  Allergen Reactions   Ibuprofen Nausea Only   Nsaids     Ulcerative stomach and small intestines   Brimonidine Itching    Red, itchy, sensitivity to light Red, itchy, sensitivity to light    Latex Itching    Other reaction(s): Other (see comments)     The results of significant diagnostics from this hospitalization (including imaging, microbiology, ancillary and laboratory) are listed below for reference.   Consultations:   Procedures/Studies: CT ABDOMEN PELVIS WO CONTRAST  Result Date: 05/06/2022 CLINICAL DATA:  Abdominal pain, acute, nonlocalized EXAM: CT ABDOMEN AND PELVIS WITHOUT CONTRAST TECHNIQUE: Multidetector CT imaging of the abdomen and pelvis was performed following the standard protocol without IV contrast. RADIATION DOSE REDUCTION: This exam was performed  according to the departmental dose-optimization program which includes automated exposure control, adjustment of the mA and/or kV according to patient size and/or use of iterative reconstruction technique. COMPARISON:  04/17/2022 FINDINGS: Lower chest: Small to moderate bilateral pleural effusions. Compressive atelectasis in the lower lobes. Hepatobiliary: No focal hepatic abnormality. Gallbladder unremarkable. Pancreas: No focal abnormality or ductal dilatation. Spleen: No focal abnormality.  Normal size. Adrenals/Urinary Tract: No adrenal abnormality. No focal renal abnormality. No stones or hydronephrosis. Urinary bladder decompressed with Foley catheter in place. Stomach/Bowel: Gastrostomy tube in the stomach. Stomach, large and small bowel grossly unremarkable. Vascular/Lymphatic: No evidence of aneurysm or adenopathy. Reproductive: Uterus and adnexa unremarkable.  No mass. Other: No free fluid or free air. Musculoskeletal: No acute bony abnormality. IMPRESSION: Small to moderate bilateral pleural effusions with compressive atelectasis in the lower lobes. No acute findings in the abdomen or pelvis. Electronically Signed   By: Rolm Baptise M.D.   On: 05/06/2022 18:10   DG Swallowing Func-Speech Pathology  Result Date: 05/02/2022 Table formatting from the original result was not included. Objective Swallowing Evaluation: Type of Study: MBS-Modified Barium Swallow Study  Patient Details Name: LAURA CALDAS MRN: [id number] Date of Birth: [birth year] Today's Date: 05/02/2022 Time: SLP Start Time (ACUTE ONLY): 49 -SLP Stop Time (ACUTE ONLY): 1250 SLP Time Calculation (min) (ACUTE ONLY): 25 min Past Medical History: Past Medical History: Diagnosis Date  Diabetes mellitus without complication (HCC)   GERD (gastroesophageal reflux disease)   Hypertension   Renal disorder   stage 3  Past Surgical History: Past Surgical History: Procedure Laterality Date  DILATION AND CURETTAGE OF UTERUS    Miscarriage  EYE SURGERY     bilateral caterac  FINGER SURGERY    IR GASTROSTOMY TUBE MOD SED  04/29/2022  KNEE ARTHROSCOPY Left 1989  [facility]  PILONIDAL CYST DRAINAGE   HPI: 67 year old female with past medical history of ETOH abuse, diabetes mellitus type 1, stage IIIa chronic kidney disease, hypertension and ongoing Alcohol use who was brought in for worsening confusion for the last month.  Workup revealed urinary tract infection, acute kidney injury, hypomagnesemia, hyperkalemia.and Wernicke's encephalopathy.  While on floor she continued to deteriorate and has become unresponsive with jerky movements. PCCM consultation for concern patient developing Delerium Tremens. Pt required oral intubation; intubated 01/01 thru 04/24/2022.   CXR on 04/25/22: New right perihilar airspace opacity suspicious for early  pneumonia.  2. Hazy accentuated density  at the right lung base with obscuration  of the right lateral hemidiaphragm, potentially from atelectasis or  pneumonia.  3. Airway thickening is present, suggesting bronchitis or reactive  airways disease.  Subjective: pt continues to be severely encephalopathic, unable to follow directions, awake but inattentive to stimuli  Recommendations for follow up therapy are one component of a multi-disciplinary discharge planning process, led by the attending physician.  Recommendations may be updated based on patient status, additional functional criteria and insurance authorization. Assessment / Plan / Recommendation   05/02/2022   9:00 PM Clinical Impressions Clinical Impression Pt continues to present with profound encephalopathy, suspect Wernicke Kerskoff. Pt with bilateral mittens in place. During this study, pt presents with no change since this therapist treated pt last Friday. She continues with decreased response to PO stimuli at liips, when mitten was removed, pt was able to hold cup but demonstrated no attention to it and exhibit withdrawal when therapist provided hand over hand assistance to attempt to  bring cup to her mouth. There were a few occasions that pt allowed liquid to enter her mouth. this was followed by oral holding and decreased attention to bolus. Pt was very distracted with looking around the x-ray machine and was largely non-responsive to maximal multimodal cues to swallow. Pt held boluses for upwards of 45 seconds. When swallow was initiated, it was swift with good airway protection. At this time, pt is inappropraite to return to PO intake as her main source of nutrition and hydration. Recommend ice chips after oral care to faciliate use of swallow musculature. Given pt's continued encephalopathy, she is unable to participate in skilled therapy at this time. Please re-consult when pt can consistently sustain attention to and exhibit function of a spoon or cup SLP Visit Diagnosis Dysphagia, oropharyngeal phase (R13.12) Impact on safety and function Severe aspiration risk;Risk for inadequate nutrition/hydration     05/02/2022   9:00 PM Treatment Recommendations Treatment Recommendations Patient unable to participate in swallow therapy at this time     05/02/2022   9:00 PM Prognosis Prognosis for Safe Diet Advancement Guarded Barriers to Reach Goals Cognitive deficits;Severity of deficits   05/02/2022   9:00 PM Diet Recommendations SLP Diet Recommendations Ice chips PRN after oral care Medication Administration Via alternative means Compensations Minimize environmental distractions;Slow rate;Small sips/bites Postural Changes Seated upright at 90 degrees     05/02/2022   9:00 PM Other Recommendations Oral Care Recommendations Oral care QID Follow Up Recommendations Skilled nursing-short term rehab (<3 hours/day) Functional Status Assessment Patient has had a recent decline in their functional status and/or demonstrates limited ability to make significant improvements in function in a reasonable and predictable amount of time   04/25/2022   5:00 PM Frequency and Duration  Speech Therapy Frequency (ACUTE  ONLY) min 2x/week Treatment Duration 2 weeks     05/02/2022   9:00 PM Oral Phase Oral Phase Impaired    05/02/2022   9:00 PM Pharyngeal Phase Pharyngeal Phase [facility]    05/02/2022   9:00 PM Cervical Esophageal Phase  Cervical Esophageal Phase [facility] Happi Overton 05/02/2022, 9:35 PM                     EEG adult  Result Date: 05/01/2022 Lora Havens, MD     05/01/2022  1:12 PM Patient Name: KAYDANCE BOWIE MRN: [id number] Epilepsy Attending: Lora Havens Referring Physician/Provider: Amie Portland, MD Date: 05/01/2022 Duration: 25.58 mins Patient history: 67 year old female with altered mental status.  EEG to evaluate for seizure. Level of alertness: Awake, asleep AEDs during EEG study: None Technical aspects: This EEG study was done with scalp electrodes positioned according to the 10-20 International system of electrode placement. Electrical activity was reviewed with band pass filter of 1-70Hz , sensitivity of 7 uV/mm, display speed of 81mm/sec with a 60Hz  notched filter applied as appropriate. EEG data were recorded continuously and digitally stored.  Video monitoring was available and reviewed as appropriate. Description: The posterior dominant rhythm consists of 7.5 Hz activity of moderate voltage (25-35 uV) seen predominantly in posterior head regions, symmetric and reactive to eye opening and eye closing. Sleep was characterized by vertex waves, sleep spindles (12 to 14 Hz), maximal frontocentral region. EEG showed intermittent generalized 3 to 6 Hz theta-delta slowing. Hyperventilation and photic stimulation were not performed.   ABNORMALITY - Intermittent slow, generalized IMPRESSION: This study is suggestive of mild diffuse encephalopathy, nonspecific etiology. No seizures or epileptiform discharges were seen throughout the recording. Priyanka O Yadav   US RENAL  Result Date: 05/01/2022 CLINICAL DATA:  Acute kidney injury. EXAM: RENAL / URINARY TRACT ULTRASOUND COMPLETE COMPARISON:  CT scan 04/17/2022  FINDINGS: Right Kidney: Renal measurements: 10.1 x 5.0 x 4.9 cm = volume: 130.2 mL. Normal renal cortical thickness and echogenicity without focal lesions or hydronephrosis. Left Kidney: Renal measurements: 10.8 x 6.2 x 5.0 cm = volume: 175.69 mL. Normal renal cortical thickness and echogenicity without focal lesions or hydronephrosis. Small lower pole left renal calculus also demonstrated on prior CT scan. Bladder: Decompressed by a Foley catheter. Other: None. IMPRESSION: 1. Unremarkable sonographic appearance of both kidneys. No renal lesions or hydronephrosis. 2. The bladder is decompressed by a Foley catheter. Electronically Signed   By: Marijo Sanes M.D.   On: 05/01/2022 09:28   IR GASTROSTOMY TUBE MOD SED  Result Date: 04/29/2022 INDICATION: Dysphagia. EXAM: PERCUTANEOUS GASTROSTOMY TUBE PLACEMENT COMPARISON:  KUB, 04/26/2022.  CT AP, 04/17/2022. MEDICATIONS: Ancef 2 gm IV; Antibiotics were administered within 1 hour of the procedure. Zofran 4 mg IV and glucagon 0.5 mg IV CONTRAST:  15 mL of Isovue 300 administered into the gastric lumen. ANESTHESIA/SEDATION: Moderate (conscious) sedation was employed during this procedure. A total of Versed 1 mg and Fentanyl 50 mcg was administered intravenously. Moderate Sedation Time: 12 minutes. The patient's level of consciousness and vital signs were monitored continuously by radiology nursing throughout the procedure under my direct supervision. FLUOROSCOPY TIME:  Fluoroscopic dose; 34.1 mGy COMPLICATIONS: None immediate. PROCEDURE: Informed written consent was obtained from the patient and/or patient's representative following explanation of the procedure, risks, benefits and alternatives. A time out was performed prior to the initiation of the procedure. Maximal barrier sterile technique utilized including caps, mask, sterile gowns, sterile gloves, large sterile drape, hand hygiene and Betadine prep. The LEFT upper quadrant was sterilely prepped and draped. A  oral gastric catheter was inserted into the stomach under fluoroscopy. The existing nasogastric feeding tube was removed. The left costal margin and barium opacified transverse colon were identified and avoided. Air was injected into the stomach for insufflation and visualization under fluoroscopy. Under sterile conditions and local anesthesia, 2 T tacks were utilized to pexy the anterior aspect of the stomach against the ventral abdominal wall. Contrast injection confirmed appropriate positioning of each of the T tacks. An incision was made between the T tacks and a 17 gauge trocar needle was utilized to access the stomach. Needle position was confirmed within the stomach with aspiration of air and injection of a  small amount of contrast. A stiff Glidewire was advanced into the gastric lumen and under intermittent fluoroscopic guidance, the access needle was exchanged for a Kumpe catheter. With the use of the Kumpe catheter, a stiff Glidewire was advanced into the horizontal segment of the duodenum. Under intermittent fluoroscopic guidance, the Kumpe catheter was exchanged for a telescoping peel-away sheath, ultimately allowing placement of a 18 Fr balloon retention gastrostomy tube. The retention balloon was insufflated with a mixture of dilute saline and contrast and pulled taut against the anterior wall of the stomach. The external disc was cinched. Contrast injection confirms positioning within the stomach. Several spot radiographic images were obtained in various obliquities for documentation. T he patient tolerated procedure well without immediate post procedural complication. FINDINGS: After successful fluoroscopic guided placement, the gastrostomy tube is appropriately positioned with internal retention balloon against the ventral aspect of the gastric lumen. IMPRESSION: Successful placement of a 18 Fr balloon-retention gastrostomy tube. The gastrostomy may be used for medication administration within 6  hours placement, and initiation of tube feeds in 24 hrs. PLAN: Patient will return to [facility] ([facility]) for routine feeding tube evaluation and exchange in 6 months. Michaelle Birks, MD [facility] Electronically Signed   By: Michaelle Birks M.D.   On: 04/29/2022 12:05   DG Abd 1 View  Result Date: 04/26/2022 CLINICAL DATA:  Nasogastric tube placement. EXAM: ABDOMEN - 1 VIEW COMPARISON:  April 22, 2022 FINDINGS: A feeding tube is seen with its distal tip overlying the expected region of the gastric antrum. The bowel gas pattern is normal. No radio-opaque calculi or other significant radiographic abnormality are seen. IMPRESSION: Feeding tube positioning, as described above. Electronically Signed   By: Virgina Norfolk M.D.   On: 04/26/2022 22:43   DG Chest Port 1 View  Result Date: 04/25/2022 CLINICAL DATA:  Pneumonia EXAM: PORTABLE CHEST 1 VIEW COMPARISON:  04/23/2022 FINDINGS: The endotracheal tube is no longer present. Feeding tube coiled in the stomach. Airway thickening is present, suggesting bronchitis or reactive airways disease. New right perihilar airspace opacity suspicious for early pneumonia. Left lung appears clear. Hazy accentuated density at the right lung base with some obscuration of the right lateral hemidiaphragm, potentially from atelectasis or pneumonia. Heart size within normal limits for portable technique. IMPRESSION: 1. New right perihilar airspace opacity suspicious for early pneumonia. 2. Hazy accentuated density at the right lung base with obscuration of the right lateral hemidiaphragm, potentially from atelectasis or pneumonia. 3. Airway thickening is present, suggesting bronchitis or reactive airways disease. Electronically Signed   By: Van Clines M.D.   On: 04/25/2022 11:58   DG Chest Port 1 View  Result Date: 04/23/2022 CLINICAL DATA:  Respiratory failure, endotracheal tube positioning. EXAM:  PORTABLE CHEST 1 VIEW COMPARISON:  04/21/2022 FINDINGS: The endotracheal tube tip is 3.7 cm above the carina, satisfactorily positioned. Feeding tube tip in the stomach. Mild biapical pleuroparenchymal scarring. Linear subsegmental atelectasis or scarring at the left lung base. Cardiac and mediastinal margins appear normal. IMPRESSION: 1. The endotracheal tube tip is 3.7 cm above the carina, satisfactorily positioned. 2. Feeding tube tip in the stomach. Electronically Signed   By: Van Clines M.D.   On: 04/23/2022 11:59   DG Abd 1 View  Result Date: 04/22/2022 CLINICAL DATA:  Dyspnea, Dobbhoff placement EXAM: ABDOMEN - 1 VIEW COMPARISON:  CT 04/17/2022 FINDINGS: Feeding tube tip overlies the stomach. No evidence of bowel obstruction. No acute osseous abnormality. IMPRESSION: Feeding tube tip  overlies the stomach. Electronically Signed   By: Maurine Simmering M.D.   On: 04/22/2022 14:12   CT HEAD WO CONTRAST (5MM)  Result Date: 04/22/2022 CLINICAL DATA:  Altered mental status. EXAM: CT HEAD WITHOUT CONTRAST TECHNIQUE: Contiguous axial images were obtained from the base of the skull through the vertex without intravenous contrast. RADIATION DOSE REDUCTION: This exam was performed according to the departmental dose-optimization program which includes automated exposure control, adjustment of the mA and/or kV according to patient size and/or use of iterative reconstruction technique. COMPARISON:  CT Head 04/18/22 FINDINGS: Brain: There is sequela of moderate chronic microvascular ischemic change with advanced generalized volume loss. Size and shape of the ventricular system is unchanged compared to recent prior head CT. No evidence of hemorrhage. No CT evidence of a new infarct. No extra-axial fluid collection. Vascular: No hyperdense vessel or unexpected calcification. Skull: Normal. Negative for fracture or focal lesion. Sinuses/Orbits: Bilateral lens replacement. Mild mucosal thickening bilateral maxillary and  ethmoid sinuses. Bilateral mastoid and middle ears are clear. Frothy secretions in the nasopharynx, nonspecific. Other: None. IMPRESSION: 1. No acute intracranial abnormality. 2. Sequela of moderate chronic microvascular ischemic change with advanced generalized volume loss. Electronically Signed   By: Marin Roberts M.D.   On: 04/22/2022 13:54   DG Chest Port 1 View  Result Date: 04/21/2022 CLINICAL DATA:  Endotracheal tube placement. EXAM: PORTABLE CHEST 1 VIEW COMPARISON:  04/18/2022 FINDINGS: An endotracheal tube is noted just above the carina - recommend 2-3 cm retraction. The cardiomediastinal silhouette is unremarkable. There is no evidence of focal airspace disease, pulmonary edema, suspicious pulmonary nodule/mass, pleural effusion, or pneumothorax. No acute bony abnormalities are identified. IMPRESSION: 1. Endotracheal tube just above the carina - recommend 2-3 cm retraction. 2. No active disease. Electronically Signed   By: Margarette Canada M.D.   On: 04/21/2022 17:37   ECHOCARDIOGRAM COMPLETE  Result Date: 04/21/2022    ECHOCARDIOGRAM REPORT   Patient Name:   DONOVAN GATCHEL Date of Exam: 04/20/2022 Medical Rec #:  [id number]       Height:       70.0 in Accession #:    [id number]      Weight:       205.0 lb Date of Birth:  Jan 24, 1956       BSA:          2.109 m Patient Age:    40 years        BP:           159/79 mmHg Patient Gender: F               HR:           102 bpm. Exam Location:  [facility] Procedure: 2D Echo, Color Doppler and Cardiac Doppler Indications:     I50.31 congestive heart failure-Acute Diastolic  History:         Patient has no prior history of Echocardiogram examinations.                  Risk Factors:Hypertension and Diabetes.  Sonographer:     Charmayne Sheer Referring Phys:  [id number] Gammon [facility] Diagnosing Phys: Isaias Cowman MD  Sonographer Comments: Suboptimal apical window. Image acquisition challenging due to respiratory motion and and inability to awaken patient. IMPRESSIONS  1.  Left ventricular ejection fraction, by estimation, is 50 to 55%. The left ventricle has low normal function. The left ventricle has no regional wall motion abnormalities. Left ventricular diastolic parameters were normal.  2. Right ventricular systolic function is normal. The right ventricular size is normal.  3. The mitral valve is normal in structure. Trivial mitral valve regurgitation. No evidence of mitral stenosis.  4. The aortic valve is normal in structure. Aortic valve regurgitation is not visualized. No aortic stenosis is present.  5. The inferior vena cava is normal in size with greater than 50% respiratory variability, suggesting right atrial pressure of 3 mmHg. FINDINGS  Left Ventricle: Left ventricular ejection fraction, by estimation, is 50 to 55%. The left ventricle has low normal function. The left ventricle has no regional wall motion abnormalities. The left ventricular internal cavity size was normal in size. There is no left ventricular hypertrophy. Left ventricular diastolic parameters were normal. Right Ventricle: The right ventricular size is normal. No increase in right ventricular wall thickness. Right ventricular systolic function is normal. Left Atrium: Left atrial size was normal in size. Right Atrium: Right atrial size was normal in size. Pericardium: There is no evidence of pericardial effusion. Mitral Valve: The mitral valve is normal in structure. Trivial mitral valve regurgitation. No evidence of mitral valve stenosis. Tricuspid Valve: The tricuspid valve is normal in structure. Tricuspid valve regurgitation is trivial. No evidence of tricuspid stenosis. Aortic Valve: The aortic valve is normal in structure. Aortic valve regurgitation is not visualized. No aortic stenosis is present. Aortic valve mean gradient measures 4.0 mmHg. Aortic valve peak gradient measures 7.2 mmHg. Aortic valve area, by VTI measures 1.64 cm. Pulmonic Valve: The pulmonic valve was normal in structure.  Pulmonic valve regurgitation is not visualized. No evidence of pulmonic stenosis. Aorta: The aortic root is normal in size and structure. Venous: The inferior vena cava is normal in size with greater than 50% respiratory variability, suggesting right atrial pressure of 3 mmHg. IAS/Shunts: No atrial level shunt detected by color flow Doppler.  LEFT VENTRICLE PLAX 2D LVIDd:         4.40 cm   Diastology LVIDs:         3.20 cm   LV e' medial:    7.94 cm/s LV PW:         0.90 cm   LV E/e' medial:  10.3 LV IVS:        0.80 cm   LV e' lateral:   8.27 cm/s LVOT diam:     1.80 cm   LV E/e' lateral: 9.9 LV SV:         39 LV SV Index:   18 LVOT Area:     2.54 cm  RIGHT VENTRICLE RV Basal diam:  3.00 cm RV S prime:     19.00 cm/s LEFT ATRIUM             Index        RIGHT ATRIUM           Index LA diam:        3.30 cm 1.56 cm/m   RA Area:     12.90 cm LA Vol (A2C):   32.6 ml 15.45 ml/m  RA Volume:   27.20 ml  12.89 ml/m LA Vol (A4C):   54.9 ml 26.03 ml/m LA Biplane Vol: 45.8 ml 21.71 ml/m  AORTIC VALVE                    PULMONIC VALVE AV Area (Vmax):    1.96 cm     PV Vmax:       1.03 m/s AV Area (Vmean):   1.90 cm  PV Vmean:      63.500 cm/s AV Area (VTI):     1.64 cm     PV VTI:        0.171 m AV Vmax:           134.00 cm/s  PV Peak grad:  4.2 mmHg AV Vmean:          93.800 cm/s  PV Mean grad:  2.0 mmHg AV VTI:            0.237 m AV Peak Grad:      7.2 mmHg AV Mean Grad:      4.0 mmHg LVOT Vmax:         103.00 cm/s LVOT Vmean:        70.200 cm/s LVOT VTI:          0.153 m LVOT/AV VTI ratio: 0.65  AORTA Ao Root diam: 3.10 cm MITRAL VALVE MV Area (PHT): 5.66 cm    SHUNTS MV Decel Time: 134 msec    Systemic VTI:  0.15 m MV E velocity: 81.80 cm/s  Systemic Diam: 1.80 cm MV A velocity: 81.80 cm/s MV E/A ratio:  1.00 Isaias Cowman MD Electronically signed by Isaias Cowman MD Signature Date/Time: 04/21/2022/9:57:17 AM    Final    MR BRAIN W WO CONTRAST  Result Date: 04/18/2022 CLINICAL DATA:  Altered  mental status EXAM: MRI HEAD WITHOUT AND WITH CONTRAST TECHNIQUE: Multiplanar, multiecho pulse sequences of the brain and surrounding structures were obtained without and with intravenous contrast. CONTRAST:  71mL GADAVIST GADOBUTROL 1 MMOL/ML IV SOLN COMPARISON:  None Available. FINDINGS: Brain: No acute infarction, hemorrhage, hydrocephalus, extra-axial collection or mass lesion. There is diffuse, advanced volume loss for age. Ex vacuo ventriculomegaly. No chronic microhemorrhage. There is multifocal hyperintense T2-weighted signal within the periventricular and deep white matter. Vascular: Normal flow voids. Skull and upper cervical spine: Normal marrow signal. Sinuses/Orbits: Negative. Other: None IMPRESSION: 1. No acute intracranial abnormality. 2. Advanced diffuse volume loss for age with periventricular white matter hyperintensities. 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   On: 04/18/2022 19:33   DG Chest Port 1 View  Result Date: 04/18/2022 CLINICAL DATA:  67 year old female with history of aspiration. EXAM: PORTABLE CHEST 1 VIEW COMPARISON:  Chest x-ray 03/21/2017. FINDINGS: Lung volumes are slightly low. No confluent consolidative airspace disease. No pleural effusions. Diffuse interstitial prominence and peribronchial cuffing noted in the lungs bilaterally. No pneumothorax. No evidence of pulmonary edema. Heart size is normal. Upper mediastinal contours are within normal limits. Atherosclerotic calcifications in the thoracic aorta. IMPRESSION: 1. The appearance of the chest suggest an acute bronchitis, as above. 2. Aortic atherosclerosis. Electronically Signed   By: Vinnie Langton M.D.   On: 04/18/2022 10:15   CT HEAD WO CONTRAST (5MM)  Result Date: 04/18/2022 CLINICAL DATA:  Mental status change, unknown cause; Neck trauma (Age >= 65y) EXAM: CT HEAD WITHOUT CONTRAST CT CERVICAL SPINE  WITHOUT CONTRAST TECHNIQUE: Multidetector CT imaging of the head and cervical spine was performed following the standard protocol without intravenous contrast. Multiplanar CT image reconstructions of the cervical spine were also generated. RADIATION DOSE REDUCTION: This exam was performed according to the departmental dose-optimization program which includes automated exposure control, adjustment of the mA and/or kV according to patient size and/or use of iterative reconstruction technique. COMPARISON:  CT head 03/23/2017 FINDINGS: CT HEAD FINDINGS Brain: Moderate parenchymal volume loss is present, relatively advanced  given the patient's age. Superimposed mild periventricular white matter changes are present, likely reflecting the sequela of small vessel ischemia. There is moderate ventriculomegaly which appears progressive since prior examination and appears disproportionate to the degree of parenchymal volume loss. While this may represent asymmetric central atrophy, changes of normal pressure hydrocephalus could appear similarly. No evidence of acute intracranial hemorrhage or infarct. No abnormal mass effect or midline shift. No abnormal intra or extra-axial mass lesion or fluid collection. Cerebellum unremarkable. Vascular: No hyperdense vessel or unexpected calcification. Skull: Normal. Negative for fracture or focal lesion. Sinuses/Orbits: No acute finding. Other: Mastoid air cells and middle ear cavities are clear. CT CERVICAL SPINE FINDINGS Alignment: Mild reversal of the normal cervical lordosis. No listhesis. Skull base and vertebrae: Craniocervical alignment is normal. The atlantodental interval is not widened. No acute fracture of the cervical spine. Vertebral body height is preserved. Soft tissues and spinal canal: No prevertebral fluid or swelling. No visible canal hematoma. Disc levels: Intervertebral disc space narrowing and endplate remodeling is seen at C5-C7, more severe at C5-6 in keeping with  changes of moderate to severe degenerative disc disease. Prevertebral soft tissues are not thickened on sagittal reformats. Spinal canal is widely patent. No high-grade neuroforaminal narrowing identified. Upper chest: Negative. Other: None IMPRESSION: 1. No acute intracranial abnormality. No calvarial fracture. 2. Progressive ventriculomegaly, disproportionate to the degree of parenchymal volume loss. While this may represent asymmetric central atrophy, changes of normal pressure hydrocephalus could appear similarly. 3. No acute fracture or listhesis of the cervical spine. Electronically Signed   By: Fidela Salisbury M.D.   On: 04/18/2022 00:53   CT Cervical Spine Wo Contrast  Result Date: 04/18/2022 CLINICAL DATA:  Mental status change, unknown cause; Neck trauma (Age >= 65y) EXAM: CT HEAD WITHOUT CONTRAST CT CERVICAL SPINE WITHOUT CONTRAST TECHNIQUE: Multidetector CT imaging of the head and cervical spine was performed following the standard protocol without intravenous contrast. Multiplanar CT image reconstructions of the cervical spine were also generated. RADIATION DOSE REDUCTION: This exam was performed according to the departmental dose-optimization program which includes automated exposure control, adjustment of the mA and/or kV according to patient size and/or use of iterative reconstruction technique. COMPARISON:  CT head 03/23/2017 FINDINGS: CT HEAD FINDINGS Brain: Moderate parenchymal volume loss is present, relatively advanced given the patient's age. Superimposed mild periventricular white matter changes are present, likely reflecting the sequela of small vessel ischemia. There is moderate ventriculomegaly which appears progressive since prior examination and appears disproportionate to the degree of parenchymal volume loss. While this may represent asymmetric central atrophy, changes of normal pressure hydrocephalus could appear similarly. No evidence of acute intracranial hemorrhage or infarct.  No abnormal mass effect or midline shift. No abnormal intra or extra-axial mass lesion or fluid collection. Cerebellum unremarkable. Vascular: No hyperdense vessel or unexpected calcification. Skull: Normal. Negative for fracture or focal lesion. Sinuses/Orbits: No acute finding. Other: Mastoid air cells and middle ear cavities are clear. CT CERVICAL SPINE FINDINGS Alignment: Mild reversal of the normal cervical lordosis. No listhesis. Skull base and vertebrae: Craniocervical alignment is normal. The atlantodental interval is not widened. No acute fracture of the cervical spine. Vertebral body height is preserved. Soft tissues and spinal canal: No prevertebral fluid or swelling. No visible canal hematoma. Disc levels: Intervertebral disc space narrowing and endplate remodeling is seen at C5-C7, more severe at C5-6 in keeping with changes of moderate to severe degenerative disc disease. Prevertebral soft tissues are not thickened on sagittal reformats. Spinal canal is widely  patent. No high-grade neuroforaminal narrowing identified. Upper chest: Negative. Other: None IMPRESSION: 1. No acute intracranial abnormality. No calvarial fracture. 2. Progressive ventriculomegaly, disproportionate to the degree of parenchymal volume loss. While this may represent asymmetric central atrophy, changes of normal pressure hydrocephalus could appear similarly. 3. No acute fracture or listhesis of the cervical spine. Electronically Signed   By: Fidela Salisbury M.D.   On: 04/18/2022 00:53   CT ABDOMEN PELVIS WO CONTRAST  Result Date: 04/17/2022 CLINICAL DATA:  Abdominal pain, acute, nonlocalized EXAM: CT ABDOMEN AND PELVIS WITHOUT CONTRAST TECHNIQUE: Multidetector CT imaging of the abdomen and pelvis was performed following the standard protocol without IV contrast. RADIATION DOSE REDUCTION: This exam was performed according to the departmental dose-optimization program which includes automated exposure control, adjustment of the  mA and/or kV according to patient size and/or use of iterative reconstruction technique. COMPARISON:  None Available. FINDINGS: Lower chest: No acute findings.  Small hiatal hernia. Hepatobiliary: No focal hepatic abnormality. Gallbladder unremarkable. Pancreas: No focal abnormality or ductal dilatation. Spleen: No focal abnormality.  Normal size. Adrenals/Urinary Tract: 2 mm nonobstructing stone in the lower pole of the left kidney. No ureteral stones or hydronephrosis. Adrenal glands and urinary bladder unremarkable. Stomach/Bowel: Normal appendix. Stomach, large and small bowel grossly unremarkable. Vascular/Lymphatic: Aortic atherosclerosis. No evidence of aneurysm or adenopathy. Reproductive: Uterus and adnexa unremarkable.  No mass. Other: No free fluid or free air. Musculoskeletal: No acute bony abnormality. IMPRESSION: No acute findings. Punctate left lower pole nephrolithiasis.  No hydronephrosis. Aortic atherosclerosis. Electronically Signed   By: Rolm Baptise M.D.   On: 04/17/2022 18:15      Labs: BNP (last 3 results) Recent Labs    04/20/22 0539 04/25/22 0936  BNP 356.5* 102.7*   Basic Metabolic Panel: Recent Labs  Lab 05/07/22 0444 05/08/22 0516 05/09/22 0459 05/10/22 0420 05/11/22 0509  NA 139 143 138 141 139  K 4.8 4.8 4.7 4.4 4.4  CL 110 114* 112* 112* 109  CO2 19* 22 20* 22 22  GLUCOSE 121* 207* 316* 79 205*  BUN 29* 31* 29* 33* 33*  CREATININE 1.75* 1.82* 1.84* 1.68* 1.63*  CALCIUM 8.8* 8.8* 8.3* 8.9 9.0  MG  --  1.8 1.7 1.9  --   PHOS  --  4.8* 4.4 3.5  --    Liver Function Tests: Recent Labs  Lab 05/06/22 1450  AST 15  ALT 7  ALKPHOS 58  BILITOT 0.6  PROT 6.8  ALBUMIN 2.5*   Recent Labs  Lab 05/06/22 1504  LIPASE 70*   No results for input(s): "AMMONIA" in the last 168 hours. CBC: Recent Labs  Lab 05/08/22 0515 05/09/22 0459 05/10/22 0420 05/11/22 0509 05/12/22 0455  WBC 8.2 8.9 10.0 9.3 10.5  HGB 9.0* 8.3* 8.6* 9.1* 8.3*  HCT 29.0* 26.0*  27.1* 29.4* 25.8*  MCV 101.0* 99.2 98.9 101.7* 98.5  PLT 756* 684* 675* 706* 607*   Cardiac Enzymes: No results for input(s): "CKTOTAL", "CKMB", "CKMBINDEX", "TROPONINI" in the last 168 hours. BNP: Invalid input(s): "POCBNP" CBG: Recent Labs  Lab 05/11/22 1954 05/11/22 2329 05/12/22 0428 05/12/22 0909 05/12/22 1303  GLUCAP 320* 187* 171* 235* 356*   D-Dimer No results for input(s): "DDIMER" in the last 72 hours. Hgb A1c No results for input(s): "HGBA1C" in the last 72 hours. Lipid Profile No results for input(s): "CHOL", "HDL", "LDLCALC", "TRIG", "CHOLHDL", "LDLDIRECT" in the last 72 hours. Thyroid function studies No results for input(s): "TSH", "T4TOTAL", "T3FREE", "THYROIDAB" in the last 72 hours.  Invalid input(s): "FREET3" Anemia work up No results for input(s): "VITAMINB12", "FOLATE", "FERRITIN", "TIBC", "IRON", "RETICCTPCT" in the last 72 hours. Urinalysis    Component Value Date/Time   COLORURINE YELLOW (A) 04/25/2022 1214   APPEARANCEUR CLEAR (A) 04/25/2022 1214   APPEARANCEUR Cloudy (A) 08/22/2020 1402   LABSPEC 1.012 04/25/2022 1214   PHURINE 5.0 04/25/2022 1214   GLUCOSEU NEGATIVE 04/25/2022 1214   HGBUR NEGATIVE 04/25/2022 1214   BILIRUBINUR NEGATIVE 04/25/2022 1214   BILIRUBINUR Negative 08/22/2020 1402   KETONESUR NEGATIVE 04/25/2022 1214   PROTEINUR NEGATIVE 04/25/2022 1214   NITRITE NEGATIVE 04/25/2022 1214   LEUKOCYTESUR TRACE (A) 04/25/2022 1214   Sepsis Labs Recent Labs  Lab 05/09/22 0459 05/10/22 0420 05/11/22 0509 05/12/22 0455  WBC 8.9 10.0 9.3 10.5   Microbiology No results found for this or any previous visit (from the past 240 hour(s)).   Total time spend on discharging this patient, including the last patient exam, discussing the hospital stay, instructions for ongoing care as it relates to all pertinent caregivers, as well as preparing the medical discharge records, prescriptions, and/or referrals as applicable, is 35  minutes.    Enzo Bi, MD  [facility] 05/12/2022, 1:27 PM

## 2022-05-12 NOTE — Inpatient Diabetes Management (Addendum)
Inpatient Diabetes Program Recommendations  AACE/ADA: New Consensus Statement on Inpatient Glycemic Control (2015)  Target Ranges:  Prepandial:   less than 140 mg/dL      Peak postprandial:   less than 180 mg/dL (1-2 hours)      Critically ill patients:  140 - 180 mg/dL   Lab Results  Component Value Date   GLUCAP 235 (H) 05/12/2022   HGBA1C 7.2 (H) 04/18/2022    Review of Glycemic Control  Latest Reference Range & Units 05/11/22 09:02 05/11/22 12:10 05/11/22 16:31 05/11/22 19:54 05/11/22 23:29 05/12/22 04:28 05/12/22 09:09  Glucose-Capillary 70 - 99 mg/dL 265 (H) 229 (H) 224 (H) 320 (H) 187 (H) 171 (H) 235 (H)  (H): Data is abnormally high  Diabetes history: DM1(does not make insulin.  Needs correction, basal and meal coverage) Outpatient Diabetes medications:  Basaglar 12 units QHS Novolog 0-9 units TID and 5 units TID with meals  Current orders for Inpatient glycemic control:  Novolog 0-15 units Q4H, Novolog 3 units TID with meals, semglee 10 units QHS  Inpatient Diabetes Program Recommendations:    Novolog 3 units 6xday with bolus feeds-hold if not receiving feeds Novolog 0-9 units TID and HS  Will continue to follow while inpatient.  Thank you, Reche Dixon, MSN, [facility] Coordinator Inpatient Diabetes Program [phone] (team pager from 8a-5p)

## 2022-05-12 NOTE — Progress Notes (Signed)
Occupational Therapy Treatment Patient Details Name: Laurie Lin MRN: [id number] DOB: 20-Feb-1956 Today's Date: 05/12/2022   History of present illness Pt is a 67 y.o. female presenting to hospital 05/06/22 with hematochezia, coffee-ground emesis, and BRBPR (returned to hospital same day discharged from hospital to SNF).  Recent hospital admission (04/17/22-05/06/22) for encephalopathy, alcohol withdrawal complicated by Wernicke-Korsakoff requiring PEG tube placement and SNF placement.  Pt now admitted with acute GI bleeding, anemia, and CKD.  PMH includes DM1, stage IIIa CKD, htn, ETOH abuse.   OT comments  Laurie Lin is making progress toward her self care goals.  Patient seen this date for OT treatment focused on engagement in grooming tasks and functional cognition.  Patient was pleasant and agreeable to OT, though provided fluctuating levels of responses to simple cues/commands.  OT provided max (6-8) verbal, visual demonstration, tactile, and hand over hand cueing for patient to use simple grooming tools (washcloth, toothbrush, lip balm).  Patient ultimately unable to use 3/3 tools effectively, and required total assist from OT to complete grooming tasks.  She provided inconsistent responses to cueing for comfort, pain, or meeting other needs.  Patient appeared calm and all anticipated needs met.  OT provided education and re-orientation to call button, date, and other frequent needs.  Laurie Lin will likely continue to benefit from skilled OT services in acute setting to support functional strengthening and cognition, safety and independence in ADLs.  Discharge recommendation remains appropriate.   Recommendations for follow up therapy are one component of a multi-disciplinary discharge planning process, led by the attending physician.  Recommendations may be updated based on patient status, additional functional criteria and insurance authorization.    Follow Up Recommendations  Skilled  nursing-short term rehab (<3 hours/day)     Assistance Recommended at Discharge Frequent or constant Supervision/Assistance  Patient can return home with the following  Two people to help with walking and/or transfers;Two people to help with bathing/dressing/bathroom   Equipment Recommendations   (defer to next level of care)    Recommendations for Other Services      Precautions / Restrictions Precautions Precautions: Fall Precaution Comments: PEG tube (with abdominal binder) Restrictions Weight Bearing Restrictions: No       Mobility Bed Mobility Overal bed mobility: Needs Assistance               Patient Response: Flat affect, Cooperative  Transfers Overall transfer level: Needs assistance                       Balance Overall balance assessment: Needs assistance                                         ADL either performed or assessed with clinical judgement   ADL Overall ADL's : Needs assistance/impaired     Grooming: Sitting;Wash/dry face;Cueing for sequencing;Total assistance Grooming Details (indicate cue type and reason): Max verbal, visual, and hand over hand assistance to bring washcloth to face, toothbrush to mouth.  Patient ultimately unable to use items effectively, requires total assist for completion                                    Extremity/Trunk Assessment Upper Extremity Assessment Upper Extremity Assessment: Generalized weakness RUE Deficits / Details: Per OT eval "global weakness; PROM  appears WFL" LUE Deficits / Details: Per OT eval "global weakness, PROM appears WFL with L hand contracture less pronounced"   Lower Extremity Assessment Lower Extremity Assessment: Generalized weakness        Vision Patient Visual Report: No change from baseline     Perception Perception Perception: Impaired   Praxis Praxis Praxis: Impaired Praxis Impairment Details: Initiation;Motor  planning;Perseveration    Cognition Arousal/Alertness: Awake/alert Behavior During Therapy: Flat affect Overall Cognitive Status: Impaired/Different from baseline Area of Impairment: Orientation, Attention, Memory, Following commands, Safety/judgement, Problem solving, Rancho level               Rancho Levels of Cognitive Functioning Rancho Los Amigos Scales of Cognitive Functioning: Confused, Inappropriate Non-Agitated Orientation Level: Disoriented to, Place, Time, Situation Current Attention Level: Focused Memory: Decreased short-term memory, Decreased recall of precautions Following Commands: Follows one step commands inconsistently Safety/Judgement: Decreased awareness of safety, Decreased awareness of deficits   Problem Solving: Slow processing, Decreased initiation, Difficulty sequencing, Requires tactile cues, Requires verbal cues General Comments: Inconsistently follows one-step commands and answers simple questions.  Requires multimodal cueing for ~75% of commands.  Unable to correctly use hygiene items despite verbal, visual, and hand over hand cueing. Rancho [employer] Scales of Cognitive Functioning: Confused, Inappropriate Non-Agitated      Exercises Other Exercises Other Exercises: provided max verbal, visual, and tactile cues for grooming tasks at bedlevel    Shoulder Instructions       General Comments      Pertinent Vitals/ Pain       Pain Assessment Pain Assessment: No/denies pain  Home Living Family/patient expects to be discharged to:: Skilled nursing facility                                        Prior Functioning/Environment              Frequency  Min 2X/week        Progress Toward Goals  OT Goals(current goals can now be found in the care plan section)  Progress towards OT goals: Progressing toward goals  Acute Rehab OT Goals Patient Stated Goal: go to rehab OT Goal Formulation: With family Time For Goal  Achievement: 05/22/22 Potential to Achieve Goals: Good  Plan Discharge plan remains appropriate;Frequency remains appropriate    Co-evaluation                 AM-PAC OT "6 Clicks" Daily Activity     Outcome Measure   Help from another person eating meals?: Total Help from another person taking care of personal grooming?: Total Help from another person toileting, which includes using toliet, bedpan, or urinal?: Total Help from another person bathing (including washing, rinsing, drying)?: Total Help from another person to put on and taking off regular upper body clothing?: A Lot Help from another person to put on and taking off regular lower body clothing?: A Lot 6 Click Score: 8    End of Session    OT Visit Diagnosis: Other abnormalities of gait and mobility (R26.89);Other symptoms and signs involving cognitive function   Activity Tolerance Patient tolerated treatment well   Patient Left in bed;with call bell/phone within reach;with bed alarm set   Nurse Communication          Time: 1452-1510 OT Time Calculation (min): 18 min  Charges: OT General Charges $OT Visit: 1 Visit OT Treatments $Self Care/Home Management :  8-22 mins  Jeneen Montgomery, OTR/L 05/12/22, 3:35 PM

## 2022-05-12 NOTE — Care Management Important Message (Signed)
Important Message  Patient Details  Name: Laurie Lin MRN: [id number] Date of Birth: Apr 11, 1956   Medicare Important Message Given:  Yes  Patient asleep upon time of visit, no family.  Copy of Medicare IM left on counter for reference.  Called and reviewed Medicare IM with Laurie Lin, spouse, at [phone].     Dannette Barbara 05/12/2022, 11:43 AM

## 2022-05-12 NOTE — TOC Progression Note (Addendum)
Transition of Care [facility]) - Progression Note    Patient Details  Name: Laurie Lin MRN: [id number] Date of Birth: November 03, 1955  Transition of Care [facility]) CM/SW [place], LCSW Phone Number: 05/12/2022, 8:58 AM  Clinical Narrative:  Josem Kaufmann approved: [id number]. Valid 1/21-1/24. [facility] admissions coordinator confirmed they can accept her back today.   10:28 am: Husband not at bedside. Left him a voicemail.  10:42 am: Received call back from husband. Provided update.  Expected Discharge Plan: [place] Barriers to Discharge: Continued Medical Work up  Expected Discharge Plan and Services     Post Acute Care Choice: Resumption of Svcs/PTA Provider Living arrangements for the past 2 months: Single Family Home                                       Social Determinants of Health (SDOH) Interventions SDOH Screenings   Food Insecurity: No Food Insecurity (05/09/2022)  Housing: Low Risk  (05/09/2022)  Transportation Needs: No Transportation Needs (05/09/2022)  Utilities: Not At Risk (05/09/2022)  Tobacco Use: Low Risk  (05/09/2022)    Readmission Risk Interventions    05/07/2022    9:48 AM  Readmission Risk Prevention Plan  Transportation Screening Complete  PCP or Specialist Appt within 3-5 Days Complete  Social Work Consult for [place] Planning/Counseling [place] Not Applicable  Medication Review Press photographer) Complete

## 2022-05-12 NOTE — Progress Notes (Signed)
Report given to [facility]. IV removed. Transport set up for 1500. Patient dressed in disposable gown and ready for transportation.

## 2022-05-21 ENCOUNTER — Inpatient Hospital Stay
Admission: EM | Admit: 2022-05-21 | Discharge: 2022-05-26 | DRG: 368 | Disposition: A | Discharge status: Skilled Nursing Facility | Payer: Medicare Other | Source: Ambulatory Visit | Attending: Internal Medicine | Admitting: Internal Medicine

## 2022-05-21 DIAGNOSIS — I129 Hypertensive chronic kidney disease with stage 1 through stage 4 chronic kidney disease, or unspecified chronic kidney disease: Secondary | ICD-10-CM | POA: Diagnosis present

## 2022-05-21 DIAGNOSIS — E876 Hypokalemia: Secondary | ICD-10-CM | POA: Insufficient documentation

## 2022-05-21 DIAGNOSIS — R1310 Dysphagia, unspecified: Secondary | ICD-10-CM

## 2022-05-21 DIAGNOSIS — R748 Abnormal levels of other serum enzymes: Secondary | ICD-10-CM | POA: Diagnosis present

## 2022-05-21 DIAGNOSIS — G9341 Metabolic encephalopathy: Secondary | ICD-10-CM | POA: Diagnosis present

## 2022-05-21 DIAGNOSIS — N179 Acute kidney failure, unspecified: Principal | ICD-10-CM | POA: Diagnosis present

## 2022-05-21 DIAGNOSIS — K226 Gastro-esophageal laceration-hemorrhage syndrome: Secondary | ICD-10-CM | POA: Diagnosis present

## 2022-05-21 DIAGNOSIS — F04 Amnestic disorder due to known physiological condition: Secondary | ICD-10-CM | POA: Diagnosis present

## 2022-05-21 DIAGNOSIS — N189 Chronic kidney disease, unspecified: Secondary | ICD-10-CM

## 2022-05-21 DIAGNOSIS — E512 Wernicke's encephalopathy: Secondary | ICD-10-CM | POA: Diagnosis present

## 2022-05-21 DIAGNOSIS — I951 Orthostatic hypotension: Secondary | ICD-10-CM

## 2022-05-21 DIAGNOSIS — K922 Gastrointestinal hemorrhage, unspecified: Secondary | ICD-10-CM | POA: Diagnosis present

## 2022-05-21 DIAGNOSIS — Z79899 Other long term (current) drug therapy: Secondary | ICD-10-CM

## 2022-05-21 DIAGNOSIS — Z794 Long term (current) use of insulin: Secondary | ICD-10-CM

## 2022-05-21 DIAGNOSIS — D62 Acute posthemorrhagic anemia: Secondary | ICD-10-CM

## 2022-05-21 DIAGNOSIS — K2101 Gastro-esophageal reflux disease with esophagitis, with bleeding: Principal | ICD-10-CM | POA: Diagnosis present

## 2022-05-21 DIAGNOSIS — D631 Anemia in chronic kidney disease: Secondary | ICD-10-CM | POA: Diagnosis present

## 2022-05-21 DIAGNOSIS — E1022 Type 1 diabetes mellitus with diabetic chronic kidney disease: Secondary | ICD-10-CM | POA: Diagnosis present

## 2022-05-21 DIAGNOSIS — K449 Diaphragmatic hernia without obstruction or gangrene: Secondary | ICD-10-CM | POA: Diagnosis present

## 2022-05-21 DIAGNOSIS — Z931 Gastrostomy status: Secondary | ICD-10-CM

## 2022-05-21 DIAGNOSIS — N1832 Chronic kidney disease, stage 3b: Secondary | ICD-10-CM | POA: Diagnosis present

## 2022-05-21 DIAGNOSIS — I10 Essential (primary) hypertension: Secondary | ICD-10-CM | POA: Diagnosis present

## 2022-05-21 DIAGNOSIS — E860 Dehydration: Secondary | ICD-10-CM | POA: Diagnosis present

## 2022-05-21 DIAGNOSIS — K920 Hematemesis: Secondary | ICD-10-CM | POA: Diagnosis present

## 2022-05-21 DIAGNOSIS — F1010 Alcohol abuse, uncomplicated: Secondary | ICD-10-CM | POA: Diagnosis present

## 2022-05-21 DIAGNOSIS — D539 Nutritional anemia, unspecified: Secondary | ICD-10-CM | POA: Diagnosis present

## 2022-05-21 DIAGNOSIS — G312 Degeneration of nervous system due to alcohol: Secondary | ICD-10-CM | POA: Diagnosis present

## 2022-05-21 DIAGNOSIS — E1065 Type 1 diabetes mellitus with hyperglycemia: Secondary | ICD-10-CM | POA: Diagnosis present

## 2022-05-21 DIAGNOSIS — D649 Anemia, unspecified: Secondary | ICD-10-CM | POA: Diagnosis present

## 2022-05-21 DIAGNOSIS — K2211 Ulcer of esophagus with bleeding: Secondary | ICD-10-CM | POA: Diagnosis present

## 2022-05-21 DIAGNOSIS — R531 Weakness: Secondary | ICD-10-CM

## 2022-05-21 DIAGNOSIS — Z833 Family history of diabetes mellitus: Secondary | ICD-10-CM

## 2022-05-21 DIAGNOSIS — F1026 Alcohol dependence with alcohol-induced persisting amnestic disorder: Secondary | ICD-10-CM | POA: Diagnosis present

## 2022-05-21 LAB — CBC WITH DIFFERENTIAL/PLATELET
Abs Immature Granulocytes: 0.05 10*3/uL (ref 0.00–0.07)
Basophils Absolute: 0.1 10*3/uL (ref 0.0–0.1)
Basophils Relative: 1 %
Eosinophils Absolute: 0.6 10*3/uL — ABNORMAL HIGH (ref 0.0–0.5)
Eosinophils Relative: 4 %
HCT: 33.6 % — ABNORMAL LOW (ref 36.0–46.0)
Hemoglobin: 10.4 g/dL — ABNORMAL LOW (ref 12.0–15.0)
Immature Granulocytes: 0 %
Lymphocytes Relative: 21 %
Lymphs Abs: 2.8 10*3/uL (ref 0.7–4.0)
MCH: 30.7 pg (ref 26.0–34.0)
MCHC: 31 g/dL (ref 30.0–36.0)
MCV: 99.1 fL (ref 80.0–100.0)
Monocytes Absolute: 1.1 10*3/uL — ABNORMAL HIGH (ref 0.1–1.0)
Monocytes Relative: 8 %
Neutro Abs: 9.2 10*3/uL — ABNORMAL HIGH (ref 1.7–7.7)
Neutrophils Relative %: 66 %
Platelets: 546 10*3/uL — ABNORMAL HIGH (ref 150–400)
RBC: 3.39 MIL/uL — ABNORMAL LOW (ref 3.87–5.11)
RDW: 13 % (ref 11.5–15.5)
WBC: 13.8 10*3/uL — ABNORMAL HIGH (ref 4.0–10.5)
nRBC: 0 % (ref 0.0–0.2)

## 2022-05-21 LAB — COMPREHENSIVE METABOLIC PANEL
ALT: 9 U/L (ref 0–44)
AST: 17 U/L (ref 15–41)
Albumin: 3.2 g/dL — ABNORMAL LOW (ref 3.5–5.0)
Alkaline Phosphatase: 61 U/L (ref 38–126)
Anion gap: 12 (ref 5–15)
BUN: 93 mg/dL — ABNORMAL HIGH (ref 8–23)
CO2: 28 mmol/L (ref 22–32)
Calcium: 9.5 mg/dL (ref 8.9–10.3)
Chloride: 105 mmol/L (ref 98–111)
Creatinine, Ser: 3.25 mg/dL — ABNORMAL HIGH (ref 0.44–1.00)
GFR, Estimated: 15 mL/min — ABNORMAL LOW (ref >60)
Glucose, Bld: 312 mg/dL — ABNORMAL HIGH (ref 70–99)
Potassium: 4.6 mmol/L (ref 3.5–5.1)
Sodium: 145 mmol/L (ref 135–145)
Total Bilirubin: 0.8 mg/dL (ref 0.3–1.2)
Total Protein: 7.7 g/dL (ref 6.5–8.1)

## 2022-05-21 LAB — TYPE AND SCREEN
ABO/RH(D): A POS
Antibody Screen: NEGATIVE

## 2022-05-21 MED ORDER — PANTOPRAZOLE SODIUM 40 MG IV SOLR
40.0000 mg | Freq: Once | INTRAVENOUS | Status: AC
  Administered 2022-05-21: 40 mg via INTRAVENOUS
  Filled 2022-05-21: qty 10

## 2022-05-21 MED ORDER — SODIUM CHLORIDE 0.9 % IV SOLN: Freq: Once | INTRAVENOUS | Status: AC

## 2022-05-21 MED ORDER — THIAMINE HCL 100 MG/ML IJ SOLN
500.0000 mg | Freq: Once | INTRAVENOUS | Status: AC
  Administered 2022-05-22: 500 mg via INTRAVENOUS
  Filled 2022-05-21: qty 5

## 2022-05-21 MED ORDER — SODIUM CHLORIDE 0.9 % IV BOLUS
500.0000 mL | Freq: Once | INTRAVENOUS | Status: AC
  Administered 2022-05-21: 500 mL via INTRAVENOUS

## 2022-05-21 NOTE — ED Triage Notes (Signed)
Pt BIB EMS for abnormal labs (BUN 85). Pt is at a facility for rehab following ETOH abuse. Pt has a feeding tube and rec tube feedings. Pt also was report to have vomited "coffee ground emesis". Pt GCS 13-14.

## 2022-05-21 NOTE — ED Notes (Signed)
Bladder scan completed with 457ml and then 433ml. MD Quentin Cornwall notified.

## 2022-05-21 NOTE — ED Provider Notes (Signed)
[facility] Provider Note    Event Date/Time   First MD Initiated Contact with Patient 05/21/22 2135     (approximate)   History   Labs Only   HPI  Laurie Lin is a 67 y.o. female extensive past medical Struve with prolonged recent hospitalization with Warnicke Korsakoff syndrome status post PEG tube placement re-presents to the ER from facility due to reported coffee-ground emesis and worsening blood work.  Patient on tube feeds.  Patient unable to provide any additional history.  Family at bedside unable to provide any additional history.     Physical Exam   Triage Vital Signs: ED Triage Vitals [05/21/22 2101]  Enc Vitals Group     BP 127/72     Pulse Rate (!) 126     Resp 12     Temp 98.6 F (37 C)     Temp Source Oral     SpO2 100 %     Weight      Height      Head Circumference      [facility] Flow      Pain Score      Pain Loc      Pain Edu?      Excl. in [place]?     Most recent vital signs: Vitals:   05/21/22 2101 05/21/22 2330  BP: 127/72 128/70  Pulse: (!) 126 (!) 125  Resp: 12 (!) 56  Temp: 98.6 F (37 C)   SpO2: 100% 100%     Constitutional: Alert  Eyes: Conjunctivae are normal.  Head: Atraumatic. Nose: No congestion/rhinnorhea. Mouth/Throat: Mucous membranes are moist.   Neck: Painless ROM.  Cardiovascular:   Good peripheral circulation. Respiratory: Normal respiratory effort.  No retractions.  Gastrointestinal: Soft and nontender.  PEG tube with tube feeds and tube. Musculoskeletal:  no deformity Neurologic:  MAE spontaneously.  Skin:  Skin is warm, dry and intact. No rash noted. Psychiatric:  calm and cooperative.    ED Results / Procedures / Treatments   Labs (all labs ordered are listed, but only abnormal results are displayed) Labs Reviewed  CBC WITH DIFFERENTIAL/PLATELET - Abnormal; Notable for the following components:      Result Value   WBC 13.8 (*)    RBC 3.39 (*)    Hemoglobin 10.4 (*)    HCT  33.6 (*)    Platelets 546 (*)    Neutro Abs 9.2 (*)    Monocytes Absolute 1.1 (*)    Eosinophils Absolute 0.6 (*)    All other components within normal limits  COMPREHENSIVE METABOLIC PANEL - Abnormal; Notable for the following components:   Glucose, Bld 312 (*)    BUN 93 (*)    Creatinine, Ser 3.25 (*)    Albumin 3.2 (*)    GFR, Estimated 15 (*)    All other components within normal limits  TYPE AND SCREEN  TYPE AND SCREEN     EKG     RADIOLOGY    PROCEDURES:  Critical Care performed: No  Procedures   MEDICATIONS ORDERED IN ED: Medications  thiamine (VITAMIN B1) 500 mg in normal saline (50 mL) IVPB (has no administration in time range)  sodium chloride 0.9 % bolus 500 mL (500 mLs Intravenous New Bag/Given 05/21/22 2310)  0.9 %  sodium chloride infusion ( Intravenous New Bag/Given 05/21/22 2309)  pantoprazole (PROTONIX) injection 40 mg (40 mg Intravenous Given 05/21/22 2309)     IMPRESSION / MDM / ASSESSMENT AND PLAN / ED  COURSE  I reviewed the triage vital signs and the nursing notes.                              Differential diagnosis includes, but is not limited to, UGIB, dehydration, electrolyte abnormality, uremia, anemia  Patient presenting to the ER for evaluation of symptoms as described above.  Based on symptoms, risk factors and considered above differential, this presenting complaint could reflect a potentially life-threatening illness therefore the patient will be placed on continuous pulse oximetry and telemetry for monitoring.  Laboratory evaluation will be sent to evaluate for the above complaints.  Patient's abdominal exam is soft and benign.  Not complaining of any pain or discomfort no vomiting.  She is mildly tachycardic.  Blood work shows evidence of significant AKI as compared to previous.  Mild leukocytosis.  Hemoglobin up from previous.  Will give IV fluids.  Given reported coffee-ground emesis will give dose of Protonix.  Will consult hospitalist  for observation for serial hemoglobins and management of AKI.    FINAL CLINICAL IMPRESSION(S) / ED DIAGNOSES   Final diagnoses:  AKI (acute kidney injury) (Laurie Lin)     Rx / DC Orders   ED Discharge Orders     None        Note:  This document was prepared using Dragon voice recognition software and may include unintentional dictation errors.    Merlyn Lot, MD 05/21/22 [phone]

## 2022-05-21 NOTE — Hospital Course (Addendum)
67 y.o. female emergency room today for hematemesis. Patient has had multiple admissions in the past few months and the last one being in January on the 16th 2 weeks ago for same presentation of hematemesis.  Patient reports coffee-ground emesis.  Patient has history of alcohol abuse, diabetes type 1, Wernicke's Korsakoff, hypertension, CKD stage IIIb.  Patient was discharged on January 22 to skilled nursing facility. Patient is enrolled in rehab for alcohol abuse.  Patient during last admission got a PEG tube because of patient's Wernicke- Korsakoff syndrome  2/3.  Sitter reinstated last night.  Will need to be sitter free for 24 hours prior to going out to rehab.  I explained to the patient that I do not want her standing up and last physical therapy is there.  Creatinine improved to 1.9, hemoglobin stable at 8.7 2/4.  Hemoglobin 8.4 and creatinine 1.79.  Patient given Seroquel last night. 2/5.  Hemoglobin 8.5 and creatinine 1.85.  Patient had modified barium swallow and did not have any overt signs of aspiration on pured diet with thin liquids but was not too cooperative with exam.  Will need continued follow-up with speech therapy at facility.  If patient starts increasing diet over time can taper tube feeds.

## 2022-05-21 NOTE — H&P (Signed)
History and Physical    Chief Complaint: Hematemesis   HISTORY OF PRESENT ILLNESS: Laurie Lin is an 67 y.o. female emergency room today for hematemesis. Patient has had multiple admissions in the past few months and the last one being in January on the 16th 2 weeks ago for same presentation of hematemesis.  Patient reports coffee-ground emesis.  Patient has history of alcohol abuse, diabetes type 1, Wernicke's Korsakoff, hypertension, CKD stage IIIb.  Patient was discharged on January 22 to skilled nursing facility. Patient is enrolled in rehab for alcohol abuse.  Patient during last admission got a PEG tube because of patient's Warnicke's and Korsakoff syndrome.   Pt has  Past Medical History:  Diagnosis Date   Diabetes mellitus without complication (HCC)    GERD (gastroesophageal reflux disease)    Hypertension    Renal disorder    stage 3      Review of Systems  Respiratory:  Positive for cough.   Gastrointestinal:        Hematemesis.    Allergies  Allergen Reactions   Ibuprofen Nausea Only   Nsaids     Ulcerative stomach and small intestines   Brimonidine Itching    Red, itchy, sensitivity to light Red, itchy, sensitivity to light    Latex Itching    Other reaction(s): Other (see comments)   Past Surgical History:  Procedure Laterality Date   DILATION AND CURETTAGE OF UTERUS     Miscarriage   EYE SURGERY     bilateral caterac   FINGER SURGERY     IR GASTROSTOMY TUBE MOD SED  04/29/2022   KNEE ARTHROSCOPY Left 1989   [facility]   PILONIDAL CYST DRAINAGE         MEDICATIONS: Current Outpatient Medications  Medication Instructions   acetaminophen (TYLENOL) 500 mg, Oral, 2 times daily PRN   Basaglar KwikPen 12 Units, Subcutaneous, Daily at bedtime   Continuous Blood Gluc Sensor (DEXCOM G6 SENSOR) MISC Does not apply   cyanocobalamin 1,000 mcg, Per Tube, Daily   folic acid (FOLVITE) 1 mg, Per Tube, Daily   hydrOXYzine (ATARAX) 10 mg, Per Tube, 3 times  daily PRN   insulin aspart (NOVOLOG) 0-9 Units, Subcutaneous, 3 times daily, CBG 70 - 120: 0 units<BR>CBG 121 - 150: 1 unit<BR>CBG 151 - 200: 2 units<BR>CBG 201 - 250: 3 units<BR>CBG 251 - 300: 5 units<BR>CBG 301 - 350: 7 units<BR>CBG 351 - 400: 9 units   insulin aspart (NOVOLOG) 3 Units, Subcutaneous, 6 times daily, With bolus tube feed.   Insulin Pen Needle 32G X 4 MM MISC ok to sub any brand or size needle preferred by insurance/patient, use up to 5x/day, dx E10.65   loperamide HCl (IMODIUM) 2 mg, Per Tube, As needed   magnesium hydroxide (MILK OF MAGNESIA) 400 MG/5ML suspension 30 mLs, Per Tube, Daily PRN   metoCLOPramide (REGLAN) 5 mg, Per Tube, 3 times daily before meals   Multiple Vitamin (MULTIVITAMIN WITH MINERALS) TABS tablet 1 tablet, Per Tube, Daily   Netarsudil Dimesylate (RHOPRESSA) 0.02 % SOLN 1 drop, Both Eyes, Every evening   Nutritional Supplements (FEEDING SUPPLEMENT, OSMOLITE 1.5 CAL,) LIQD 237 mLs, Per Tube, 6 times daily   ONETOUCH DELICA LANCETS 17O MISC 1 Device, Does not apply, 4 times daily   pantoprazole sodium (PROTONIX) 40 mg, Per Tube, 2 times daily   thiamine (VITAMIN B1) 100 mg, Per Tube, Daily   Travoprost, BAK Free, (TRAVATAN) 0.004 % SOLN ophthalmic solution 1 drop, Both Eyes, Daily at bedtime  vitamin D3 (CHOLECALCIFEROL) 2,000 Units, Per Tube, Daily   Water For Irrigation, Sterile (FREE WATER) SOLN 200 mLs, Per Tube, 6 times daily     insulin aspart  0-15 Units Subcutaneous TID WC   latanoprost  1 drop Both Eyes QHS     lactated ringers     sodium chloride      ED Course: Pt in Ed alert awake oriented tachycardic.  Vitals:   05/21/22 2101 05/21/22 2330 05/22/22 0121  BP: 127/72 128/70 (!) 85/72  Pulse: (!) 126 (!) 125 (!) 120  Resp: 12 12 19   Temp: 98.6 F (37 C)  98.2 F (36.8 C)  TempSrc: Oral  Oral  SpO2: 100% 100% 96%   Admission was requested for acute GI bleed. Total I/O In: 50 [IV Piggyback:50] Out: -  SpO2: 96 % Blood work in ed  shows: Glucose 312 AKI with creatinine of 3.25 and GFR of 15, CBC showing leukocytosis of 13.8 hemoglobin of 10.4 and platelets of 546. 03/2022 2 d Echo:  1. Left ventricular ejection fraction, by estimation, is 50 to 55%. The  left ventricle has low normal function. The left ventricle has no regional  wall motion abnormalities. Left ventricular diastolic parameters were  normal.   2. Right ventricular systolic function is normal. The right ventricular  size is normal.   3. The mitral valve is normal in structure. Trivial mitral valve  regurgitation. No evidence of mitral stenosis.   4. The aortic valve is normal in structure. Aortic valve regurgitation is  not visualized. No aortic stenosis is present.   5. The inferior vena cava is normal in size with greater than 50%  respiratory variability, suggesting right atrial pressure of 3 mmHg.  Results for orders placed or performed during the hospital encounter of 05/21/22 (from the past 24 hour(s))  CBC with Differential     Status: Abnormal   Collection Time: 05/21/22  9:05 PM  Result Value Ref Range   WBC 13.8 (H) 4.0 - 10.5 K/uL   RBC 3.39 (L) 3.87 - 5.11 MIL/uL   Hemoglobin 10.4 (L) 12.0 - 15.0 g/dL   HCT 33.6 (L) 36.0 - 46.0 %   MCV 99.1 80.0 - 100.0 fL   MCH 30.7 26.0 - 34.0 pg   MCHC 31.0 30.0 - 36.0 g/dL   RDW 13.0 11.5 - 15.5 %   Platelets 546 (H) 150 - 400 K/uL   nRBC 0.0 0.0 - 0.2 %   Neutrophils Relative % 66 %   Neutro Abs 9.2 (H) 1.7 - 7.7 K/uL   Lymphocytes Relative 21 %   Lymphs Abs 2.8 0.7 - 4.0 K/uL   Monocytes Relative 8 %   Monocytes Absolute 1.1 (H) 0.1 - 1.0 K/uL   Eosinophils Relative 4 %   Eosinophils Absolute 0.6 (H) 0.0 - 0.5 K/uL   Basophils Relative 1 %   Basophils Absolute 0.1 0.0 - 0.1 K/uL   Immature Granulocytes 0 %   Abs Immature Granulocytes 0.05 0.00 - 0.07 K/uL  Comprehensive metabolic panel     Status: Abnormal   Collection Time: 05/21/22  9:05 PM  Result Value Ref Range   Sodium 145 135 -  145 mmol/L   Potassium 4.6 3.5 - 5.1 mmol/L   Chloride 105 98 - 111 mmol/L   CO2 28 22 - 32 mmol/L   Glucose, Bld 312 (H) 70 - 99 mg/dL   BUN 93 (H) 8 - 23 mg/dL   Creatinine, Ser 3.25 (H) 0.44 - 1.00  mg/dL   Calcium 9.5 8.9 - 10.3 mg/dL   Total Protein 7.7 6.5 - 8.1 g/dL   Albumin 3.2 (L) 3.5 - 5.0 g/dL   AST 17 15 - 41 U/L   ALT 9 0 - 44 U/L   Alkaline Phosphatase 61 38 - 126 U/L   Total Bilirubin 0.8 0.3 - 1.2 mg/dL   GFR, Estimated 15 (L) >60 mL/min   Anion gap 12 5 - 15  Type and screen [place]     Status: None (Preliminary result)   Collection Time: 05/21/22  9:05 PM  Result Value Ref Range   ABO/RH(D) PENDING    Antibody Screen PENDING    Sample Expiration      05/24/2022,2359 Performed at [facility], [address] [place], [place] [address]   Type and screen Ordered by PROVIDER DEFAULT     Status: None   Collection Time: 05/21/22 10:48 PM  Result Value Ref Range   ABO/RH(D) A POS    Antibody Screen NEG    Sample Expiration      05/24/2022,2359 Performed at [facility], [address] [place], [place] [address]     Unresulted Labs (From admission, onward)     Start     Ordered   05/22/22 0139  Hemoglobin and hematocrit, blood  Once,   R        05/22/22 0139   05/22/22 0028  Protime-INR  Once,   R        05/22/22 0028           Pt has received : Orders Placed This Encounter  Procedures   CBC with Differential    Standing Status:   Standing    Number of Occurrences:   1   Comprehensive metabolic panel    Standing Status:   Standing    Number of Occurrences:   1   Protime-INR    Standing Status:   Standing    Number of Occurrences:   1   Hemoglobin and hematocrit, blood    Standing Status:   Standing    Number of Occurrences:   1   Diet NPO time specified    Standing Status:   Standing    Number of Occurrences:   1   SCDs    Standing Status:   Standing    Number of Occurrences:   1    Order  Specific Question:   Laterality    Answer:   Bilateral   Cardiac Monitoring Continuous x 24 hours Indications for use: Other; other indications for use: Monitor for ischemia    Standing Status:   Standing    Number of Occurrences:   1    Order Specific Question:   Indications for use:    Answer:   Other    Order Specific Question:   other indications for use:    Answer:   Monitor for ischemia   Vital signs    Standing Status:   Standing    Number of Occurrences:   1   Notify physician (specify)    Standing Status:   Standing    Number of Occurrences:   20    Order Specific Question:   Notify Physician    Answer:   for pulse less than 55 or greater than 120    Order Specific Question:   Notify Physician    Answer:   for respiratory rate less than 12 or greater than 25  Order Specific Question:   Notify Physician    Answer:   for temperature greater than 100.5 F    Order Specific Question:   Notify Physician    Answer:   for urinary output less than 30 mL/hr for four hours    Order Specific Question:   Notify Physician    Answer:   for systolic BP less than 90 or greater than 106, diastolic BP less than 60 or greater than 100    Order Specific Question:   Notify Physician    Answer:   for new hypoxia w/ oxygen saturations < 88%   Progressive Mobility Protocol: No Restrictions    Standing Status:   Standing    Number of Occurrences:   1   If patient diabetic or glucose greater than 140 notify physician for Sliding Scale Insulin Orders    Standing Status:   Standing    Number of Occurrences:   20   Intake and Output    Standing Status:   Standing    Number of Occurrences:   1   Initiate Oral Care Protocol    Standing Status:   Standing    Number of Occurrences:   1   Initiate Carrier Fluid Protocol    Standing Status:   Standing    Number of Occurrences:   1   RN may order General Admission PRN Orders utilizing "General Admission PRN medications" (through manage orders) for  the following patient needs: allergy symptoms (Claritin), cold sores (Carmex), cough (Robitussin DM), eye irritation (Liquifilm Tears), hemorrhoids (Tucks), indigestion (Maalox), minor skin irritation (Hydrocortisone Cream), muscle pain (Ben Gay), nose irritation (saline nasal spray) and sore throat (Chloraseptic spray).    Standing Status:   Standing    Number of Occurrences:   [phone]   Apply Diabetes Mellitus Care Plan    Standing Status:   Standing    Number of Occurrences:   1   STAT CBG when hypoglycemia is suspected. If treated, recheck every 15 minutes after each treatment until CBG >/= 70 mg/dl    Standing Status:   Standing    Number of Occurrences:   1   Refer to Hypoglycemia Protocol Sidebar Report for treatment of CBG < 70 mg/dl    Standing Status:   Standing    Number of Occurrences:   1   No HS correction Insulin    Standing Status:   Standing    Number of Occurrences:   1   Full code    Standing Status:   Standing    Number of Occurrences:   1    Order Specific Question:   By:    Answer:   Other   Consult to hospitalist    Standing Status:   Standing    Number of Occurrences:   1    Order Specific Question:   Place call to:    Answer:   [phone]    Order Specific Question:   Reason for Consult    Answer:   Admit   Consult to gastroenterology Consult Timeframe: ROUTINE - requires response within 24 hours; Reason for Consult? hematemesis similar to her jan episode two weeks ago.    Standing Status:   Standing    Number of Occurrences:   1    Order Specific Question:   Consult Timeframe    Answer:   ROUTINE - requires response within 24 hours    Order Specific Question:   Reason for Consult?    Answer:  hematemesis similar to her jan episode two weeks ago.   Pulse oximetry check with vital signs    Standing Status:   Standing    Number of Occurrences:   1   Oxygen therapy Mode or (Route): Nasal cannula; Liters Per Minute: 2; Keep 02 saturation: greater than 92 %     Standing Status:   Standing    Number of Occurrences:   20    Order Specific Question:   Mode or (Route)    Answer:   Nasal cannula    Order Specific Question:   Liters Per Minute    Answer:   2    Order Specific Question:   Keep 02 saturation    Answer:   greater than 92 %   EKG 12-Lead    Standing Status:   Standing    Number of Occurrences:   1   Type and screen [place]     Standing Status:   Standing    Number of Occurrences:   1   Type and screen    Standing Status:   Standing    Number of Occurrences:   1   Admit to Inpatient (patient's expected length of stay will be greater than 2 midnights or inpatient only procedure)    Standing Status:   Standing    Number of Occurrences:   1    Order Specific Question:   Hospital Area    Answer:   [facility] [[id number]]    Order Specific Question:   Level of Care    Answer:   Stepdown [14]    Order Specific Question:   Covid Evaluation    Answer:   Asymptomatic - no recent exposure (last 10 days) testing not required    Order Specific Question:   Diagnosis    Answer:   Hematemesis [place].Easter.0.ICD-9-CM]    Order Specific Question:   Admitting Physician    Answer:   Cherylann Ratel    Order Specific Question:   Attending Physician    Answer:   Cherylann Ratel    Order Specific Question:   Certification:    Answer:   I certify this patient will need inpatient services for at least 2 midnights    Order Specific Question:   Estimated Length of Stay    Answer:   2    Meds ordered this encounter  Medications   sodium chloride 0.9 % bolus 500 mL   0.9 %  sodium chloride infusion   pantoprazole (PROTONIX) injection 40 mg   thiamine (VITAMIN B1) 500 mg in normal saline (50 mL) IVPB   latanoprost (XALATAN) 0.005 % ophthalmic solution 1 drop   lactated ringers infusion   OR Linked Order Group    acetaminophen (TYLENOL) tablet 650 mg     acetaminophen (TYLENOL) suppository 650 mg   OR Linked Order Group    ondansetron (ZOFRAN) tablet 4 mg    ondansetron (ZOFRAN) injection 4 mg   hydrALAZINE (APRESOLINE) injection 5 mg   insulin aspart (novoLOG) injection 0-15 Units    Order Specific Question:   Correction coverage:    Answer:   Moderate (average weight, post-op)    Order Specific Question:   CBG < 70:    Answer:   implement hypoglycemia protocol    Order Specific Question:   CBG 70 - 120:    Answer:   0 units    Order  Specific Question:   CBG 121 - 150:    Answer:   2 units    Order Specific Question:   CBG 151 - 200:    Answer:   3 units    Order Specific Question:   CBG 201 - 250:    Answer:   5 units    Order Specific Question:   CBG 251 - 300:    Answer:   8 units    Order Specific Question:   CBG 301 - 350:    Answer:   11 units    Order Specific Question:   CBG 351 - 400:    Answer:   15 units    Order Specific Question:   CBG > 400    Answer:   call MD and obtain STAT lab verification   sodium chloride 0.9 % bolus 250 mL     Admission Imaging : No results found.    Physical Examination: Vitals:   05/21/22 2101 05/21/22 2330 05/22/22 0121  BP: 127/72 128/70 (!) 85/72  Pulse: (!) 126 (!) 125 (!) 120  Temp: 98.6 F (37 C)  98.2 F (36.8 C)  Resp: 12 12 19   SpO2: 100% 100% 96%  TempSrc: Oral  Oral   Physical Exam Vitals and nursing note reviewed.  Constitutional:      General: She is not in acute distress.    Appearance: She is not ill-appearing, toxic-appearing or diaphoretic.  HENT:     Head: Normocephalic and atraumatic.     Right Ear: Hearing and external ear normal.     Left Ear: Hearing and external ear normal.     Nose: Nose normal. No nasal deformity.     Mouth/Throat:     Lips: Pink.     Mouth: Mucous membranes are moist.     Tongue: No lesions.     Pharynx: Oropharynx is clear.  Eyes:     Extraocular Movements: Extraocular movements intact.  Neck:     Vascular: No carotid  bruit.  Cardiovascular:     Rate and Rhythm: Normal rate and regular rhythm.     Pulses: Normal pulses.     Heart sounds: Normal heart sounds.  Pulmonary:     Effort: Pulmonary effort is normal.     Breath sounds: Normal breath sounds.  Abdominal:     General: Bowel sounds are normal. There is no distension.     Palpations: Abdomen is soft. There is no mass.     Tenderness: There is no abdominal tenderness. There is no guarding.     Hernia: No hernia is present.  Musculoskeletal:     Right lower leg: No edema.     Left lower leg: No edema.  Skin:    General: Skin is warm.  Neurological:     General: No focal deficit present.     Mental Status: She is alert.     Cranial Nerves: Cranial nerves 2-12 are intact.     Motor: Motor function is intact.  Psychiatric:        Attention and Perception: Attention normal.        Speech: Speech normal.        Behavior: Behavior is cooperative.        Cognition and Memory: Cognition normal.       Assessment and Plan: * Hematemesis Type/ screen. IV PPI BID. NPO. GI consult. Dr.Toledo sent AM message.   Acute GI bleeding Suspect UGIB from PUD or varices.  We will repeat  h/h and start octreotide if hemoglobin drops. Admit to stepdown.    Anemia    Latest Ref Rng & Units 05/21/2022    9:05 PM 05/12/2022    4:55 AM 05/11/2022    5:09 AM  CBC  WBC 4.0 - 10.5 K/uL 13.8  10.5  9.3   Hemoglobin 12.0 - 15.0 g/dL 10.4  8.3  9.1   Hematocrit 36.0 - 46.0 % 33.6  25.8  29.4   Platelets 150 - 400 K/uL 546  607  706   Repeat h/h pending. Will transfuse with goal hb of 8 and above.     Acute renal failure superimposed on stage 3a chronic kidney disease ([place]) Lab Results  Component Value Date   CREATININE 3.25 (H) 05/21/2022   CREATININE 1.63 (H) 05/11/2022   CREATININE 1.68 (H) 05/10/2022  Avoid contrast.  Cont with ivf hydration.     Alcohol abuse No recent ETOH. Thiamine continued. Monitor magnesium.    Type 1 diabetes  mellitus with hyperglycemia (HCC) Glycemic protocol. SSI. Hold home meds.  Pt npo for anticipated GI eval and possible procedure.   Wernicke-Korsakoff syndrome ([place]) Pt started on thiamine 500 mg x 1.  Cont thiamine 100 mg x daily.   Essential hypertension Vitals:   05/21/22 2101 05/21/22 2330 05/22/22 0121  BP: 127/72 128/70 (!) 85/72  We will start pt on NS bolus 250 cc and repeat cbc.  Hold hydralazine.    Elevated liver enzymes    Latest Ref Rng & Units 05/21/2022    9:05 PM 05/11/2022    5:09 AM 05/10/2022    4:20 AM  CMP  Glucose 70 - 99 mg/dL 312  205  79   BUN 8 - 23 mg/dL 93  33  33   Creatinine 0.44 - 1.00 mg/dL 3.25  1.63  1.68   Sodium 135 - 145 mmol/L 145  139  141   Potassium 3.5 - 5.1 mmol/L 4.6  4.4  4.4   Chloride 98 - 111 mmol/L 105  109  112   CO2 22 - 32 mmol/L 28  22  22    Calcium 8.9 - 10.3 mg/dL 9.5  9.0  8.9   Total Protein 6.5 - 8.1 g/dL 7.7     Total Bilirubin 0.3 - 1.2 mg/dL 0.8     Alkaline Phos 38 - 126 U/L 61     AST 15 - 41 U/L 17     ALT 0 - 44 U/L 9     Stable.      DVT prophylaxis:  SCD's  Code Status:  Full code   Family Communication:   sharryn, belding (Spouse) [phone]    Disposition Plan:  SNF.  Consults called:  GI.  Admission status: Inpatient.    Unit/ Expected LOS: Stepdown. / 3 days.    Para Skeans MD [facility]  6 PM- 2 AM. Please contact me via secure Chat 6 PM-2 AM. [phone]( Pager ) To contact the [facility] Attending or Consulting provider [place] or covering provider during after hours [place], for this patient.   Check the care team in [facility] and look for a) attending/consulting [facility] provider listed and b) the [facility] team listed Log into [email or web address] and use [place]'s universal password to access. If you do not have the password, please contact the hospital operator. Locate the [facility] provider you are looking for under [facility] and page to a number that you can be directly reached. If  you still have  difficulty reaching the provider, please page the [facility] (Director on Call) for the Hospitalists listed on amion for assistance. [email or web address] 05/22/2022, 1:44 AM

## 2022-05-22 ENCOUNTER — Encounter: Payer: Self-pay | Admitting: Internal Medicine

## 2022-05-22 ENCOUNTER — Inpatient Hospital Stay: Payer: Medicare Other | Admitting: Anesthesiology

## 2022-05-22 ENCOUNTER — Other Ambulatory Visit: Payer: Self-pay

## 2022-05-22 ENCOUNTER — Encounter
Admission: EM | Disposition: A | Discharge status: Skilled Nursing Facility | Payer: Medicare Other | Source: Ambulatory Visit | Attending: Internal Medicine

## 2022-05-22 DIAGNOSIS — E1065 Type 1 diabetes mellitus with hyperglycemia: Secondary | ICD-10-CM

## 2022-05-22 DIAGNOSIS — K2101 Gastro-esophageal reflux disease with esophagitis, with bleeding: Secondary | ICD-10-CM | POA: Diagnosis present

## 2022-05-22 DIAGNOSIS — G312 Degeneration of nervous system due to alcohol: Secondary | ICD-10-CM | POA: Diagnosis present

## 2022-05-22 DIAGNOSIS — E860 Dehydration: Secondary | ICD-10-CM | POA: Diagnosis present

## 2022-05-22 DIAGNOSIS — Z833 Family history of diabetes mellitus: Secondary | ICD-10-CM | POA: Diagnosis not present

## 2022-05-22 DIAGNOSIS — N179 Acute kidney failure, unspecified: Secondary | ICD-10-CM

## 2022-05-22 DIAGNOSIS — K226 Gastro-esophageal laceration-hemorrhage syndrome: Secondary | ICD-10-CM | POA: Diagnosis present

## 2022-05-22 DIAGNOSIS — R531 Weakness: Secondary | ICD-10-CM

## 2022-05-22 DIAGNOSIS — E876 Hypokalemia: Secondary | ICD-10-CM | POA: Insufficient documentation

## 2022-05-22 DIAGNOSIS — I10 Essential (primary) hypertension: Secondary | ICD-10-CM

## 2022-05-22 DIAGNOSIS — F04 Amnestic disorder due to known physiological condition: Secondary | ICD-10-CM

## 2022-05-22 DIAGNOSIS — N189 Chronic kidney disease, unspecified: Secondary | ICD-10-CM

## 2022-05-22 DIAGNOSIS — F1010 Alcohol abuse, uncomplicated: Secondary | ICD-10-CM

## 2022-05-22 DIAGNOSIS — I951 Orthostatic hypotension: Secondary | ICD-10-CM | POA: Diagnosis present

## 2022-05-22 DIAGNOSIS — D62 Acute posthemorrhagic anemia: Secondary | ICD-10-CM | POA: Diagnosis not present

## 2022-05-22 DIAGNOSIS — D539 Nutritional anemia, unspecified: Secondary | ICD-10-CM | POA: Diagnosis present

## 2022-05-22 DIAGNOSIS — G9341 Metabolic encephalopathy: Secondary | ICD-10-CM | POA: Diagnosis present

## 2022-05-22 DIAGNOSIS — Z931 Gastrostomy status: Secondary | ICD-10-CM | POA: Diagnosis not present

## 2022-05-22 DIAGNOSIS — K920 Hematemesis: Secondary | ICD-10-CM | POA: Diagnosis present

## 2022-05-22 DIAGNOSIS — E512 Wernicke's encephalopathy: Secondary | ICD-10-CM | POA: Diagnosis present

## 2022-05-22 DIAGNOSIS — F1026 Alcohol dependence with alcohol-induced persisting amnestic disorder: Secondary | ICD-10-CM | POA: Diagnosis present

## 2022-05-22 DIAGNOSIS — K2211 Ulcer of esophagus with bleeding: Secondary | ICD-10-CM | POA: Diagnosis present

## 2022-05-22 DIAGNOSIS — Z794 Long term (current) use of insulin: Secondary | ICD-10-CM | POA: Diagnosis not present

## 2022-05-22 DIAGNOSIS — E1022 Type 1 diabetes mellitus with diabetic chronic kidney disease: Secondary | ICD-10-CM | POA: Diagnosis present

## 2022-05-22 DIAGNOSIS — D631 Anemia in chronic kidney disease: Secondary | ICD-10-CM | POA: Diagnosis present

## 2022-05-22 DIAGNOSIS — N1832 Chronic kidney disease, stage 3b: Secondary | ICD-10-CM | POA: Diagnosis present

## 2022-05-22 DIAGNOSIS — K449 Diaphragmatic hernia without obstruction or gangrene: Secondary | ICD-10-CM | POA: Diagnosis present

## 2022-05-22 DIAGNOSIS — I129 Hypertensive chronic kidney disease with stage 1 through stage 4 chronic kidney disease, or unspecified chronic kidney disease: Secondary | ICD-10-CM | POA: Diagnosis present

## 2022-05-22 HISTORY — PX: ESOPHAGOGASTRODUODENOSCOPY (EGD) WITH PROPOFOL: SHX5813

## 2022-05-22 LAB — BASIC METABOLIC PANEL
Anion gap: 12 (ref 5–15)
BUN: 86 mg/dL — ABNORMAL HIGH (ref 8–23)
CO2: 26 mmol/L (ref 22–32)
Calcium: 9.5 mg/dL (ref 8.9–10.3)
Chloride: 107 mmol/L (ref 98–111)
Creatinine, Ser: 3.13 mg/dL — ABNORMAL HIGH (ref 0.44–1.00)
GFR, Estimated: 16 mL/min — ABNORMAL LOW (ref >60)
Glucose, Bld: 240 mg/dL — ABNORMAL HIGH (ref 70–99)
Potassium: 3.4 mmol/L — ABNORMAL LOW (ref 3.5–5.1)
Sodium: 145 mmol/L (ref 135–145)

## 2022-05-22 LAB — GLUCOSE, CAPILLARY
Glucose-Capillary: 375 mg/dL — ABNORMAL HIGH (ref 70–99)
Glucose-Capillary: 217 mg/dL — ABNORMAL HIGH (ref 70–99)
Glucose-Capillary: 204 mg/dL — ABNORMAL HIGH (ref 70–99)
Glucose-Capillary: 160 mg/dL — ABNORMAL HIGH (ref 70–99)
Glucose-Capillary: 293 mg/dL — ABNORMAL HIGH (ref 70–99)

## 2022-05-22 LAB — MAGNESIUM
Magnesium: 2.3 mg/dL (ref 1.7–2.4)
Magnesium: 2.1 mg/dL (ref 1.7–2.4)

## 2022-05-22 LAB — HEMOGLOBIN AND HEMATOCRIT, BLOOD
HCT: 29.3 % — ABNORMAL LOW (ref 36.0–46.0)
Hemoglobin: 9 g/dL — ABNORMAL LOW (ref 12.0–15.0)

## 2022-05-22 LAB — MRSA NEXT GEN BY PCR, NASAL: MRSA by PCR Next Gen: NOT DETECTED

## 2022-05-22 LAB — PROTIME-INR
INR: 1.1 (ref 0.8–1.2)
Prothrombin Time: 14 seconds (ref 11.4–15.2)

## 2022-05-22 SURGERY — ESOPHAGOGASTRODUODENOSCOPY (EGD) WITH PROPOFOL
Anesthesia: General

## 2022-05-22 MED ORDER — INSULIN ASPART 100 UNIT/ML IJ SOLN
0.0000 [IU] | INTRAMUSCULAR | Status: DC
  Administered 2022-05-22: 15 [IU] via SUBCUTANEOUS
  Administered 2022-05-22: 5 [IU] via SUBCUTANEOUS
  Filled 2022-05-22 (×2): qty 1

## 2022-05-22 MED ORDER — INSULIN ASPART 100 UNIT/ML IJ SOLN: 0.0000 [IU] | Freq: Every day | INTRAMUSCULAR | Status: DC

## 2022-05-22 MED ORDER — LACTATED RINGERS IV BOLUS
500.0000 mL | Freq: Once | INTRAVENOUS | Status: AC
  Administered 2022-05-22: 500 mL via INTRAVENOUS

## 2022-05-22 MED ORDER — FOLIC ACID 1 MG PO TABS: 1.0000 mg | ORAL_TABLET | Freq: Every day | ORAL | Status: DC

## 2022-05-22 MED ORDER — PROPOFOL 10 MG/ML IV BOLUS
INTRAVENOUS | Status: DC | PRN
  Administered 2022-05-22: 70 mg via INTRAVENOUS

## 2022-05-22 MED ORDER — SODIUM CHLORIDE 0.9 % IV BOLUS: 250.0000 mL | Freq: Once | INTRAVENOUS | Status: DC

## 2022-05-22 MED ORDER — ONDANSETRON HCL 4 MG PO TABS: 4.0000 mg | ORAL_TABLET | Freq: Four times a day (QID) | ORAL | Status: DC | PRN

## 2022-05-22 MED ORDER — INSULIN ASPART 100 UNIT/ML IJ SOLN: 0.0000 [IU] | Freq: Three times a day (TID) | INTRAMUSCULAR | Status: DC

## 2022-05-22 MED ORDER — ADULT MULTIVITAMIN W/MINERALS CH: 1.0000 | ORAL_TABLET | Freq: Every day | ORAL | Status: DC

## 2022-05-22 MED ORDER — LIDOCAINE HCL (CARDIAC) PF 100 MG/5ML IV SOSY
PREFILLED_SYRINGE | INTRAVENOUS | Status: DC | PRN
  Administered 2022-05-22: 100 mg via INTRAVENOUS

## 2022-05-22 MED ORDER — OSMOLITE 1.5 CAL PO LIQD
237.0000 mL | Freq: Every day | ORAL | Status: DC
  Administered 2022-05-22 – 2022-05-26 (×23): 237 mL

## 2022-05-22 MED ORDER — FREE WATER
200.0000 mL | Freq: Every day | Status: DC
  Administered 2022-05-22 – 2022-05-26 (×23): 200 mL

## 2022-05-22 MED ORDER — ACETAMINOPHEN 325 MG PO TABS: 650.0000 mg | ORAL_TABLET | Freq: Four times a day (QID) | ORAL | Status: DC | PRN

## 2022-05-22 MED ORDER — PANTOPRAZOLE INFUSION (NEW) - SIMPLE MED
8.0000 mg/h | INTRAVENOUS | Status: DC
  Administered 2022-05-22: 8 mg/h via INTRAVENOUS
  Filled 2022-05-22: qty 100

## 2022-05-22 MED ORDER — PROPOFOL 500 MG/50ML IV EMUL
INTRAVENOUS | Status: DC | PRN
  Administered 2022-05-22: 175 ug/kg/min via INTRAVENOUS

## 2022-05-22 MED ORDER — INSULIN GLARGINE-YFGN 100 UNIT/ML ~~LOC~~ SOLN
8.0000 [IU] | Freq: Every day | SUBCUTANEOUS | Status: DC
  Administered 2022-05-22: 8 [IU] via SUBCUTANEOUS
  Filled 2022-05-22 (×3): qty 0.08

## 2022-05-22 MED ORDER — PANTOPRAZOLE INFUSION (NEW) - SIMPLE MED
8.0000 mg/h | INTRAVENOUS | Status: AC
  Administered 2022-05-22 – 2022-05-25 (×2): 8 mg/h via INTRAVENOUS
  Filled 2022-05-22 (×2): qty 100

## 2022-05-22 MED ORDER — POTASSIUM CHLORIDE 10 MEQ/100ML IV SOLN
10.0000 meq | INTRAVENOUS | Status: AC
  Administered 2022-05-22 (×2): 10 meq via INTRAVENOUS

## 2022-05-22 MED ORDER — ORAL CARE MOUTH RINSE: 15.0000 mL | OROMUCOSAL | Status: DC | PRN

## 2022-05-22 MED ORDER — LACTATED RINGERS IV SOLN
INTRAVENOUS | Status: DC
  Administered 2022-05-22: 50 mL/h via INTRAVENOUS

## 2022-05-22 MED ORDER — ADULT MULTIVITAMIN W/MINERALS CH
1.0000 | ORAL_TABLET | Freq: Every day | ORAL | Status: DC
  Administered 2022-05-23 – 2022-05-26 (×4): 1
  Filled 2022-05-22 (×4): qty 1

## 2022-05-22 MED ORDER — FOLIC ACID 1 MG PO TABS
1.0000 mg | ORAL_TABLET | Freq: Every day | ORAL | Status: DC
  Administered 2022-05-23 – 2022-05-26 (×4): 1 mg
  Filled 2022-05-22 (×4): qty 1

## 2022-05-22 MED ORDER — SODIUM CHLORIDE 0.9 % IV SOLN
300.0000 mg | Freq: Once | INTRAVENOUS | Status: AC
  Administered 2022-05-22: 300 mg via INTRAVENOUS
  Filled 2022-05-22: qty 300

## 2022-05-22 MED ORDER — LATANOPROST 0.005 % OP SOLN
1.0000 [drp] | Freq: Every day | OPHTHALMIC | Status: DC
  Administered 2022-05-22 – 2022-05-25 (×4): 1 [drp] via OPHTHALMIC
  Filled 2022-05-22: qty 2.5

## 2022-05-22 MED ORDER — ORAL CARE MOUTH RINSE
15.0000 mL | OROMUCOSAL | Status: DC
  Administered 2022-05-22 – 2022-05-26 (×15): 15 mL via OROMUCOSAL

## 2022-05-22 MED ORDER — PHENYLEPHRINE 80 MCG/ML (10ML) SYRINGE FOR IV PUSH (FOR BLOOD PRESSURE SUPPORT)
PREFILLED_SYRINGE | INTRAVENOUS | Status: AC
  Filled 2022-05-22: qty 10

## 2022-05-22 MED ORDER — ACETAMINOPHEN 650 MG RE SUPP: 650.0000 mg | Freq: Four times a day (QID) | RECTAL | Status: DC | PRN

## 2022-05-22 MED ORDER — POTASSIUM CHLORIDE 10 MEQ/100ML IV SOLN
10.0000 meq | INTRAVENOUS | Status: AC
  Filled 2022-05-22 (×2): qty 100

## 2022-05-22 MED ORDER — HYDRALAZINE HCL 20 MG/ML IJ SOLN: 5.0000 mg | INTRAMUSCULAR | Status: DC | PRN

## 2022-05-22 MED ORDER — INSULIN ASPART 100 UNIT/ML IJ SOLN
0.0000 [IU] | Freq: Three times a day (TID) | INTRAMUSCULAR | Status: DC
  Administered 2022-05-22: 3 [IU] via SUBCUTANEOUS
  Administered 2022-05-23: 7 [IU] via SUBCUTANEOUS
  Administered 2022-05-23: 9 [IU] via SUBCUTANEOUS
  Filled 2022-05-22 (×3): qty 1

## 2022-05-22 MED ORDER — ONDANSETRON HCL 4 MG/2ML IJ SOLN: 4.0000 mg | Freq: Four times a day (QID) | INTRAMUSCULAR | Status: DC | PRN

## 2022-05-22 MED ORDER — SODIUM CHLORIDE 0.9 % IV SOLN: INTRAVENOUS | Status: DC

## 2022-05-22 MED ORDER — OSMOLITE 1.2 CAL PO LIQD: 1000.0000 mL | ORAL | Status: DC

## 2022-05-22 MED ORDER — THIAMINE MONONITRATE 100 MG PO TABS
100.0000 mg | ORAL_TABLET | Freq: Every day | ORAL | Status: DC
  Administered 2022-05-23 – 2022-05-26 (×4): 100 mg
  Filled 2022-05-22 (×4): qty 1

## 2022-05-22 NOTE — Assessment & Plan Note (Addendum)
Physical therapy evaluation appreciated

## 2022-05-22 NOTE — Assessment & Plan Note (Addendum)
Continue thiamine daily, folic acid and multivitamin.  Mental status improved today after a dose of Seroquel last night.  Will continue as needed Seroquel at night.  Mental status not back to her baseline from when she was home previously.  Very slow to progress.

## 2022-05-22 NOTE — Progress Notes (Addendum)
CROSS COVER NOTE  NAME: Laurie Lin MRN: [id number] DOB : 19-Jan-1956     HPI/Events of Note   Sign out note from admitter patient admitted with hematemesis and hematochezia in setting of chirrhosis ad hx of GI bleed had drop in BP.  LR bolus of 250 ml/h ordered   Assessment and  Interventions   Assessment: Review of labs  Thrombocytosis since 04/25/2022 no apparent diagnosis - appears work up for auto immune disease initiated with her recurrent uveitis but appears to have been lost to follow up Acute on chronic kidney injury Chronic anemia with baseline hgb 8-9. Upon this admission 10.4, likely hemoconcentration from dehydration Plan: Increase bolus LR to 500 Increase maintenance IVF's to 125 ml/h      Kathlene Cote NP [facility]

## 2022-05-22 NOTE — Progress Notes (Signed)
Initial Nutrition Assessment  DOCUMENTATION CODES:   Not applicable  INTERVENTION:   Bolus feedings via PEG:    237 ml (1 carton ARC) Osmolite 1.5 6 times daily   100 ml free water flush before and after each feeding administration   Tube feeding regimen provides 2130 kcal (100% of needs), 90 grams of protein, and 1086 ml of H2O. Total free water: 2286 ml daily   NUTRITION DIAGNOSIS:   Inadequate oral intake related to inability to eat, dysphagia as evidenced by NPO status.  GOAL:   Patient will meet greater than or equal to 90% of their needs  MONITOR:   TF tolerance  REASON FOR ASSESSMENT:   Malnutrition Screening Tool, Consult Enteral/tube feeding initiation and management  ASSESSMENT:   Pt with history of alcohol abuse, diabetes type 1, Wernicke's Korsakoff, hypertension, CKD stage IIIb admitted with hematemesis.  Pt admitted with hematemesis.   2/1- s/p EGD- revealed erosive esophagitis with bleeding, mallory weiss tear, and hiatal hernia  Reviewed I/O's: +1.2 L x 24 hours   Pt familiar to this RD due to to multiple prior admissions. Pt currently a resident at [facility]. She is NPO and dependent on TF secondary to dysphagia and Wernicke's encephalopathy.   Pt lying in bed with safety sitter present. Pt pleasantly confused, but unable to provide meaningful history. Attempted to reorient pt; states she is looking for her husband, who thinks that he is sitting over near the nurse's station.   Reviewed wt hx; pt has experienced a 10.5% wt loss over the past month, which is significant for time frame.   Case discussed with hospitalist and GI; ok to resume TF today.   Medications reviewed and include folic acid, thiamine, potassium chloride, and lactated ringers infusion @ 50 ml/hr.   Lab Results  Component Value Date   HGBA1C 7.2 (H) 04/18/2022   PTA DM medications are 12 units insulin glargine daily at bedtime, 3 units insulin aspart 6 times daily, and  SSI (insulin aspart) TID.   Labs reviewed: K: 3.4 (on IV supplementation), CBGS: 217 (inpatient orders for glycemic control are 0-5 units insulin aspart daily at bedtime, 0-9 units insulin aspart TID with meals, and 8 units insulin glargine-yfgn daily at bedtime).    NUTRITION - FOCUSED PHYSICAL EXAM:  Flowsheet Row Most Recent Value  Orbital Region No depletion  Upper Arm Region Mild depletion  Thoracic and Lumbar Region No depletion  Buccal Region No depletion  Temple Region No depletion  Clavicle Bone Region Mild depletion  Clavicle and Acromion Bone Region Mild depletion  Scapular Bone Region Mild depletion  Dorsal Hand No depletion  Patellar Region Mild depletion  Anterior Thigh Region Mild depletion  Posterior Calf Region Mild depletion  Edema (RD Assessment) None  Hair Reviewed  Eyes Reviewed  Mouth Reviewed  Skin Reviewed  Nails Reviewed       Diet Order:   Diet Order             Diet NPO time specified  Diet effective now                   EDUCATION NEEDS:   Not appropriate for education at this time  Skin:  Skin Assessment: Reviewed RN Assessment  Last BM:  Unknown  Height:   Ht Readings from Last 1 Encounters:  04/29/22 5\' 10"  (1.778 m)    Weight:   Wt Readings from Last 1 Encounters:  05/22/22 79.3 kg    Ideal Body Weight:  68.2 kg  BMI:  Body mass index is 25.08 kg/m.  Estimated Nutritional Needs:   Kcal:  1850-2050  Protein:  90-105 grams  Fluid:  > 1.9 L    Loistine Chance, RD, LDN, [place] Registered Dietitian II Certified Diabetes Care and Education Specialist Please refer to [facility] for RD and/or RD on-call/weekend/after hours pager

## 2022-05-22 NOTE — Assessment & Plan Note (Addendum)
Continue to monitor hemoglobin closely.  Last hemoglobin 8.7.  IV iron given on 2/1.

## 2022-05-22 NOTE — Consult Note (Signed)
GI Inpatient Consult Note  Reason for Consult: GI bleed   Attending Requesting Consult: Dr. Carmon Sails, MD  History of Present Illness: Laurie Lin is a 67 y.o. female seen for evaluation of GI bleed at the request of admitting hospitalist - Dr. Florina Ou. Patient has a PMH of recently diagnosed Wernicke-Korsakoff syndrome with prolonged hospitalization (12/28 - 05/06/22 and readmission 1/16 - 05/12/22) s/p PEG tube placement, T1DM, HTN, CKD Stage IIIa, and alcohol use disorder presented to the [facility] ED via EMS from SNF for chief complaint of coffee-ground emesis. Upon presentation to the ED, she was normotensive, afebrile, and tachycardic (126). Labs significant for WBC 13.8K, hemoglobin 10.4, MCV 99, BUN 92, serum creatinine 3.25, albumin 3.2, and INR 1.1. She was started on IV fluids, given dose of Protonix, and admitted to stepdown for observation for serial hemoglobin checks and AKI. GI consulted for concerns of chronic GI bleeding.   Patient seen and examined this morning in ICU. No acute events overnight. No family bedside. She has sitter, Daphne, in room with her. There are no further episodes of hematemesis or signs of vomiting. Patient unable to provide much information to the history. She is able to tell me her name, husband's name, and nothing else. She denies any abdominal pain, nausea, or diarrhea. PEG tube in place and she has been getting tube feeds at SNF.    Past Medical History:  Past Medical History:  Diagnosis Date   Diabetes mellitus without complication ([place])    GERD (gastroesophageal reflux disease)    Hypertension    Renal disorder    stage 3     Problem List: Patient Active Problem List   Diagnosis Date Noted   Hematemesis 05/22/2022   Acute GI bleeding 05/06/2022   Aspiration pneumonia ([place]) 04/25/2022   Overweight (BMI 25.0-29.9) 04/19/2022   Encephalopathy acute 04/18/2022   Alcohol abuse 04/18/2022   GERD without esophagitis 04/18/2022   Acute  renal failure superimposed on stage 3a chronic kidney disease ([place]) 04/18/2022   Wernicke-Korsakoff syndrome ([place]) 04/18/2022   Trochanteric bursitis of left hip 01/17/2020   Anemia in chronic kidney disease 11/03/2019   Chronic kidney disease, stage 3 unspecified ([place]) 11/03/2019   Burn of second degree of left foot, initial encounter 12/28/2018   Neurogenic pain 09/30/2017   Anemia 09/30/2017   Hyponatremia 09/30/2017   Hypocalcemia 09/30/2017   Hypoalbuminemia 09/30/2017   Diabetes mellitus with stage 4 chronic kidney disease GFR 15-29 ([place]) 09/30/2017   Diabetic peripheral neuropathy ([place]) 09/30/2017   Chronic pain of hip (B) 09/30/2017   Chronic sacroiliac joint pain (B) 09/30/2017   Lumbar facet joint syndrome 09/30/2017   H/O diabetic foot ulcer 09/09/2017   Chronic low back pain (Primary Area of Pain) (Bilateral) (R>L) w/ sciatica (Bilateral) 09/09/2017   Chronic lower extremity pain (Secondary Area of Pain) (Bilateral) (R>L) 09/09/2017   Chronic pain syndrome 09/09/2017   Long term current use of opiate analgesic 09/09/2017   Pharmacologic therapy 09/09/2017   Disorder of skeletal system 09/09/2017   Problems influencing health status 09/09/2017   Other cirrhosis of liver ([place]) 06/03/2017   Chronic lumbar radiculopathy 06/03/2017   Chronic neck pain 06/03/2017   Type 1 diabetes mellitus with hyperglycemia ([place]) 04/30/2017   Atrial flutter ([place]) 04/30/2017   Neuropathy 04/30/2017   Hiatal hernia 04/30/2017   Cataracts, bilateral 04/30/2017   Albuminuria 04/30/2017   DKA (diabetic ketoacidoses) 03/21/2017   Reactive depression 01/03/2015   Essential hypertension 09/28/2014   Hepatic  steatosis 08/17/2013   Elevated liver enzymes 08/17/2013   Abnormal levels of other serum enzymes 08/17/2013    Past Surgical History: Past Surgical History:  Procedure Laterality Date   DILATION AND CURETTAGE OF UTERUS     Miscarriage   EYE SURGERY     bilateral caterac   FINGER  SURGERY     IR GASTROSTOMY TUBE MOD SED  04/29/2022   KNEE ARTHROSCOPY Left 1989   [facility]   PILONIDAL CYST DRAINAGE      Allergies: Allergies  Allergen Reactions   Ibuprofen Nausea Only   Nsaids     Ulcerative stomach and small intestines   Brimonidine Itching    Red, itchy, sensitivity to light Red, itchy, sensitivity to light    Latex Itching    Other reaction(s): Other (see comments)    Home Medications: Medications Prior to Admission  Medication Sig Dispense Refill Last Dose   cholecalciferol (CHOLECALCIFEROL) 25 MCG tablet Place 2 tablets (2,000 Units total) into feeding tube daily.   Past Week   Continuous Blood Gluc Sensor (DEXCOM G6 SENSOR) MISC by Does not apply route.   Past Week   cyanocobalamin 1000 MCG tablet Place 1 tablet (1,000 mcg total) into feeding tube daily.   Past Week   folic acid (FOLVITE) 1 MG tablet Place 1 tablet (1 mg total) into feeding tube daily.  3 Past Week   insulin aspart (NOVOLOG) 100 UNIT/ML injection Inject 0-9 Units into the skin 3 (three) times daily. CBG 70 - 120: 0 units CBG 121 - 150: 1 unit CBG 151 - 200: 2 units CBG 201 - 250: 3 units CBG 251 - 300: 5 units CBG 301 - 350: 7 units CBG 351 - 400: 9 units   Past Week   insulin aspart (NOVOLOG) 100 UNIT/ML injection Inject 3 Units into the skin 6 (six) times daily. With bolus tube feed.   Past Week   Insulin Glargine (BASAGLAR KWIKPEN) 100 UNIT/ML SOPN Inject 12 Units into the skin at bedtime.    Past Week   Insulin Pen Needle 32G X 4 MM MISC ok to sub any brand or size needle preferred by insurance/patient, use up to 5x/day, dx E10.65   Past Week   metoCLOPramide (REGLAN) 5 MG/5ML solution Place 5 mLs (5 mg total) into feeding tube 3 (three) times daily before meals. 120 mL 0 Past Week   Multiple Vitamin (MULTIVITAMIN WITH MINERALS) TABS tablet Place 1 tablet into feeding tube daily.   Past Week   Netarsudil Dimesylate (RHOPRESSA) 0.02 % SOLN Place 1 drop into both eyes every evening.   Past  Week   Nutritional Supplements (FEEDING SUPPLEMENT, OSMOLITE 1.5 CAL,) LIQD Place 237 mLs into feeding tube 6 (six) times daily.  0 Past Week   ONETOUCH DELICA LANCETS 16X MISC 1 Device by Does not apply route 4 (four) times daily. 100 each 11 Past Week   pantoprazole sodium (PROTONIX) 40 mg Place 40 mg into feeding tube 2 (two) times daily.   Past Week   thiamine (VITAMIN B1) 100 MG tablet Place 1 tablet (100 mg total) into feeding tube daily.   Past Week   Travoprost, BAK Free, (TRAVATAN) 0.004 % SOLN ophthalmic solution Place 1 drop into both eyes at bedtime.   Past Week   Water For Irrigation, Sterile (FREE WATER) SOLN Place 200 mLs into feeding tube 6 (six) times daily.   Past Week   acetaminophen (TYLENOL) 500 MG tablet Take 500 mg by mouth 2 (two) times daily  as needed.    prn at prn   hydrOXYzine (ATARAX) 10 MG tablet Place 1 tablet (10 mg total) into feeding tube 3 (three) times daily as needed for itching. 30 tablet 0 prn at prn   loperamide HCl (IMODIUM) 1 MG/7.5ML suspension Place 15 mLs (2 mg total) into feeding tube as needed for diarrhea or loose stools. 120 mL 0 prn at prn   magnesium hydroxide (MILK OF MAGNESIA) 400 MG/5ML suspension Place 30 mLs into feeding tube daily as needed for mild constipation. 355 mL 0 prn at prn   Home medication reconciliation was completed with the patient.   Scheduled Inpatient Medications:    insulin aspart  0-15 Units Subcutaneous Q4H   latanoprost  1 drop Both Eyes QHS   mouth rinse  15 mL Mouth Rinse 4 times per day    Continuous Inpatient Infusions:    lactated ringers 125 mL/hr at 05/22/22 0447   pantoprazole 8 mg/hr (05/22/22 0239)    PRN Inpatient Medications:  acetaminophen **OR** acetaminophen, ondansetron **OR** ondansetron (ZOFRAN) IV, mouth rinse  Family History: family history includes Diabetes in her mother.  The patient's family history is negative for inflammatory bowel disorders, GI malignancy, or solid organ  transplantation.  Social History:   reports that she has never smoked. She has never used smokeless tobacco. She reports current alcohol use of about 6.0 standard drinks of alcohol per week. She reports that she does not use drugs. The patient denies ETOH, tobacco, or drug use.   Review of Systems:  Unable to obtain 2/2 baseline encephalopathy    Physical Examination: BP 135/75   Pulse (!) 111   Temp 97.8 F (36.6 C) (Oral)   Resp (!) 23   Wt 79.3 kg   LMP 09/09/2004 (Approximate)   SpO2 100%   BMI 25.08 kg/m  Gen: NAD, alert and oriented to self x1 HEENT: PEERLA, EOMI, Neck: supple, no JVD or thyromegaly Chest: CTA bilaterally, no wheezes, crackles, or other adventitious sounds CV: RRR, no m/g/c/r Abd: soft, NT, ND, +BS in all four quadrants; no HSM, guarding, ridigity, or rebound tenderness, +PEG tube intact with no erythema  Ext: no edema, well perfused with 2+ pulses, Skin: no rash or lesions noted Lymph: no LAD  Data: Lab Results  Component Value Date   WBC 13.8 (H) 05/21/2022   HGB 9.0 (L) 05/22/2022   HCT 29.3 (L) 05/22/2022   MCV 99.1 05/21/2022   PLT 546 (H) 05/21/2022   Recent Labs  Lab 05/21/22 2105 05/22/22 0147  HGB 10.4* 9.0*   Lab Results  Component Value Date   NA 145 05/22/2022   K 3.4 (L) 05/22/2022   CL 107 05/22/2022   CO2 26 05/22/2022   BUN 86 (H) 05/22/2022   CREATININE 3.13 (H) 05/22/2022   GLU 208 03/29/2019   Lab Results  Component Value Date   ALT 9 05/21/2022   AST 17 05/21/2022   ALKPHOS 61 05/21/2022   BILITOT 0.8 05/21/2022   Recent Labs  Lab 05/22/22 0106  INR 1.1    Assessment/Plan:  67 y/o Caucasian female with a PMH of recently diagnosed Wernicke-Korsakoff syndrome with prolonged hospitalization (12/28 - 05/06/22 and readmission 1/16 - 05/12/22), T1DM, HTN, CKD Stage IIIa, and alcohol use disorder presented to the [facility] ED via EMS from SNF for chief complaint of coffee-ground emesis  Coffee-ground emesis -  concerning for chronic GI bleed from UGI source. DDx includes peptic ulcer disease, gastritis +/- H pylori, erosive esophagitis, AVMs, Dieulafoy's  lesion, GAVE, colon polyps, colon neoplasm, other occult GI malignancy, etc. H&H stable with no significant drop in hemoglobin compared to one week ago. No overt gastrointestinal bleeding.   Encephalopathy - presumed 2/2 Wernicke-Korsakoff syndrome related to alcohol use disorder. She is receiving folic acid, thiamine, and B12.    Macrocytic anemia - likely 2/2 bone marrow suppression in setting of EtOH abuse  AKI  T1DM  Alcohol use disorder   Recommendations:  - Maintain 2 large bore IVs for access - H&H stable currently with no significant hemodynamic changes - Continue to monitor serial H&H. Transfuse for Hgb <7.0.  - Continue Protonix for gastric protection - No signs of overt gastrointestinal bleeding  - Advise EGD for luminal evaluation +/- endoscopic hemostasis. I have attempted to call patient's husband, Mialee Weyman, x2 and had to [facility]. We will need consent before procedure.  - Plan for EGD this afternoon with Dr. Alice Reichert - NPO - See procedure note for findings and further recommendations  I reviewed the risks (including bleeding, perforation, infection, anesthesia complications, cardiac/respiratory complications), benefits and alternatives of EGD. Patient consents to proceed.    Thank you for the consult. Please call with questions or concerns.  Geanie Kenning, PA-C [facility] Gastroenterology [phone]

## 2022-05-22 NOTE — Assessment & Plan Note (Deleted)
Suspect UGIB from PUD or varices.  We will repeat h/h and start octreotide if hemoglobin drops. Admit to stepdown.

## 2022-05-22 NOTE — Assessment & Plan Note (Addendum)
Secondary to grade D esophagitis with Mallory-Weiss tear seen on EGD on 2/1.  Patient was on Protonix drip and then IV Protonix and can go over to her Protonix twice a day upon disposition hemoglobin stable at 8.5.  Recommend checking a CBC in 1 week

## 2022-05-22 NOTE — ED Notes (Signed)
This RN scanned the pt's bladder for urinary retention as the previous nurse note 410 ml of urine when he scanned the pt, but no intervention was noted by that nurse. The pt had 195 ml of urine noted at this time on the bladder scan. Per the previous RN, the pt had been placed on the purewick and a brief s/p his initial scan. The pt removed the purewick and urinated in the brief prior to this RN taking over the pt's care. Per the previous RN, the pt urinated in the brief and was changed prior to transferring her to the new room.

## 2022-05-22 NOTE — Assessment & Plan Note (Addendum)
Holding antihypertensive medication with orthostatic hypotension.

## 2022-05-22 NOTE — Progress Notes (Addendum)
1000 Report given to [facility] in Endoscopy. 1020 Patient transported per bed to Endoscopy suite. 1200 Return report given by Marlana Latus.  0867 Back from Endo.via bed. 1340 Report to [facility] on [place].

## 2022-05-22 NOTE — Transfer of Care (Signed)
Immediate Anesthesia Transfer of Care Note  Patient: CARINA CHAPLIN  Procedure(s) Performed: ESOPHAGOGASTRODUODENOSCOPY (EGD) WITH PROPOFOL  Patient Location: Endoscopy Unit  Anesthesia Type:General  Level of Consciousness: drowsy  Airway & Oxygen Therapy: Patient Spontanous Breathing  Post-op Assessment: Report given to RN and Post -op Vital signs reviewed and stable  Post vital signs: Reviewed and stable  Last Vitals:  Vitals Value Taken Time  BP 124/70 05/22/22 1140  Temp    Pulse 101 05/22/22 1142  Resp 22 05/22/22 1142  SpO2 97 % 05/22/22 1142  Vitals shown include unvalidated device data.  Last Pain:  Vitals:   05/22/22 0241  TempSrc: Oral  PainSc:          Complications: No notable events documented.

## 2022-05-22 NOTE — Op Note (Addendum)
[facility] Gastroenterology Patient Name: Laurie Lin Procedure Date: 05/22/2022 10:17 AM MRN: [id number] Account #: [id number] Date of Birth: 02/16/1956 Admit Type: Inpatient Age: 67 Room: [facility] ENDO ROOM 4 Gender: Female Note Status: Supervisor Override Instrument Name: Altamese Cabal Endoscope [id number] Procedure:             Upper GI endoscopy Indications:           Coffee-ground emesis Providers:             Lorie Apley K. Alice Reichert MD, MD Referring MD:          No Local Md, MD (Referring MD) Medicines:             Propofol per Anesthesia Complications:         No immediate complications. Estimated blood loss:                         Minimal. Procedure:             Pre-Anesthesia Assessment:                        - The risks and benefits of the procedure and the                         sedation options and risks were discussed with the                         patient. All questions were answered and informed                         consent was obtained.                        - Patient identification and proposed procedure were                         verified prior to the procedure by the nurse. The                         procedure was verified in the procedure room.                        - ASA Grade Assessment: III - A patient with severe                         systemic disease.                        - After reviewing the risks and benefits, the patient                         was deemed in satisfactory condition to undergo the                         procedure.                        After obtaining informed consent, the endoscope was                         passed under  direct vision. Throughout the procedure,                         the patient's blood pressure, pulse, and oxygen                         saturations were monitored continuously. The Endoscope                         was introduced through the mouth, and advanced to the                         third  part of duodenum. The upper GI endoscopy was                         accomplished without difficulty. The patient tolerated                         the procedure well. Findings:      LA Grade D (one or more mucosal breaks involving at least 75% of       esophageal circumference) esophagitis with bleeding was found in the       middle and lower thirds of the esophagus.      A 15 mm non-bleeding Mallory-Weiss tear with no stigmata of recent       bleeding was found.      A 1 cm hiatal hernia was present.      Intact internal bumper of PEG tube noted in the body of the stomach.      The exam of the stomach was otherwise normal.      The examined duodenum was normal.      The exam was otherwise without abnormality. Impression:            Non-bleeding.                        - LA Grade D erosive esophagitis with bleeding.                        - Mallory-Weiss tear.                        - 1 cm hiatal hernia.                        - Normal examined duodenum.                        - The examination was otherwise normal.                        - No specimens collected. Recommendation:        - Return patient to ICU for ongoing care.                        - Use Protonix (pantoprazole) 80 mg IV BID.                        - Advance diet as tolerated.                        -  Serial H/H, Serial exams Procedure Code(s):     --- Professional ---                        [phone], Esophagogastroduodenoscopy, flexible,                         transoral; diagnostic, including collection of                         specimen(s) by brushing or washing, when performed                         (separate procedure) Diagnosis Code(s):     --- Professional ---                        K92.0, Hematemesis                        K44.9, Diaphragmatic hernia without obstruction or                         gangrene                        K22.6, Gastro-esophageal laceration-hemorrhage syndrome                         K20.81, Other esophagitis with bleeding CPT copyright 2022 American Medical Association. All rights reserved. The codes documented in this report are preliminary and upon coder review may  be revised to meet current compliance requirements. Efrain Sella MD, MD 05/22/2022 11:41:06 AM This report has been signed electronically. Number of Addenda: 0 Note Initiated On: 05/22/2022 10:17 AM Estimated Blood Loss:  Estimated blood loss was minimal. Estimated blood loss                         was minimal.      [facility]

## 2022-05-22 NOTE — Assessment & Plan Note (Deleted)
Latest Ref Rng & Units 05/21/2022    9:05 PM 05/11/2022    5:09 AM 05/10/2022    4:20 AM  CMP  Glucose 70 - 99 mg/dL 312  205  79   BUN 8 - 23 mg/dL 93  33  33   Creatinine 0.44 - 1.00 mg/dL 3.25  1.63  1.68   Sodium 135 - 145 mmol/L 145  139  141   Potassium 3.5 - 5.1 mmol/L 4.6  4.4  4.4   Chloride 98 - 111 mmol/L 105  109  112   CO2 22 - 32 mmol/L 28  22  22    Calcium 8.9 - 10.3 mg/dL 9.5  9.0  8.9   Total Protein 6.5 - 8.1 g/dL 7.7     Total Bilirubin 0.3 - 1.2 mg/dL 0.8     Alkaline Phos 38 - 126 U/L 61     AST 15 - 41 U/L 17     ALT 0 - 44 U/L 9     Stable.

## 2022-05-22 NOTE — Inpatient Diabetes Management (Signed)
Inpatient Diabetes Program Recommendations  AACE/ADA: New Consensus Statement on Inpatient Glycemic Control   Target Ranges:  Prepandial:   less than 140 mg/dL      Peak postprandial:   less than 180 mg/dL (1-2 hours)      Critically ill patients:  140 - 180 mg/dL    Latest Reference Range & Units 05/22/22 03:48 05/22/22 08:00  Glucose-Capillary 70 - 99 mg/dL 375 (H) 217 (H)    Latest Reference Range & Units 05/21/22 21:05  CO2 22 - 32 mmol/L 28  Glucose 70 - 99 mg/dL 312 (H)  Anion gap 5 - 15  12    Latest Reference Range & Units 04/18/22 07:01  Hemoglobin A1C 4.8 - 5.6 % 7.2 (H)   Review of Glycemic Control  Diabetes history: DM1 (does not make any insulin; requires basal, correction, and carbohydrate coverage insulin) Outpatient Diabetes medications: Basaglar 12 units QHS, Novolog 0-9 units TID with meals, Novolog 3 units 6 x per day Current orders for Inpatient glycemic control: Novolog 0-15 units Q4H  Inpatient Diabetes Program Recommendations:    Insulin: Please decrease Novolog correction to 0-9 units Q4H and consider ordering Semglee 8 units Q24H.  Note: Patient admitted with hematemesis and acute GI bleeding. Per ED triage note patient from facility for rehab following ETOH abuse; patient has feeding tube and rec tube feedings. Patient was recently inpatient 05/06/22-05/12/22.  Per chart reivew, noted in Care Everywhere that patient sees Dr. Sheppard Evens [facility]) and was last seen on 11/25/21. Per office note on 11/25/21, patient is prescribed Basaglar 8-12 units daily, Novolog 1 unit per 15 grams of carbs, and 1 unit for every 40 mg/dl above 150 mg/dl.   Thanks, Barnie Alderman, RN, MSN, [place] [facility] Coordinator Inpatient Diabetes Program [phone] (Team Pager from 8am to [place])

## 2022-05-22 NOTE — Assessment & Plan Note (Addendum)
No recent ETOH.

## 2022-05-22 NOTE — Assessment & Plan Note (Addendum)
Replaced. °

## 2022-05-22 NOTE — Assessment & Plan Note (Addendum)
Sliding scale insulin.  With tube feeding starting last night sugars elevated.  Will increase long-acting insulin to 10 units at night and will give 3 units plus sliding scale every 4 hours.

## 2022-05-22 NOTE — Progress Notes (Signed)
  Progress Note   Patient: Laurie Lin TMH:[id number] DOB: [birth year] DOA: 05/21/2022     0 DOS: the patient was seen and examined on 05/22/2022   Brief hospital course: 67 y.o. female emergency room today for hematemesis. Patient has had multiple admissions in the past few months and the last one being in January on the 16th 2 weeks ago for same presentation of hematemesis.  Patient reports coffee-ground emesis.  Patient has history of alcohol abuse, diabetes type 1, Wernicke's Korsakoff, hypertension, CKD stage IIIb.  Patient was discharged on January 22 to skilled nursing facility. Patient is enrolled in rehab for alcohol abuse.  Patient during last admission got a PEG tube because of patient's Wernicke- Korsakoff syndrome   Assessment and Plan: * Hematemesis Secondary to grade D esophagitis with Mallory-Weiss tear seen on EGD today.  Continue Protonix drip today.  Serial hemoglobins.  Acute blood loss anemia Continue to monitor hemoglobin closely.  Last hemoglobin 9.0.  Will give IV iron today.  Acute kidney injury superimposed on CKD ([place]) AKI on CKD stage IIIb.  Creatinine 3.25 on presentation down to 3.13 today.  Continue IV fluids today.  Alcohol abuse No recent ETOH.    Type 1 diabetes mellitus with hyperglycemia (HCC) Sliding scale insulin.  And Semglee 8 units nightly.  Hopefully start tube feedings tomorrow morning.   Wernicke-Korsakoff syndrome (HCC) Continue thiamine daily, folic acid and multivitamin.  Essential hypertension Holding antihypertensive medication  Hypokalemia Replace potasium iv today  Weakness Physical therapy evaluation        Subjective: Patient answers some simple questions.  Unable to remember if the gastroenterologist saw her in the morning.  Feels okay.  Offers no complaints.  Physical Exam: Vitals:   05/22/22 0900 05/22/22 1140 05/22/22 1150 05/22/22 1200  BP: 118/77 124/70 131/69 (!) 136/90  Pulse:  99 (!) 102 (!) 102  Resp:  13 15 13 15   Temp:  97.8 F (36.6 C)    TempSrc:  Temporal    SpO2:  99% 97% 97%  Weight:       Physical Exam HENT:     Head: Normocephalic.     Mouth/Throat:     Pharynx: No oropharyngeal exudate.  Eyes:     General: Lids are normal.     Conjunctiva/sclera: Conjunctivae normal.  Cardiovascular:     Rate and Rhythm: Regular rhythm. Tachycardia present.     Heart sounds: Normal heart sounds, S1 normal and S2 normal.  Pulmonary:     Breath sounds: No decreased breath sounds, wheezing, rhonchi or rales.  Abdominal:     Palpations: Abdomen is soft.     Tenderness: There is no abdominal tenderness.  Musculoskeletal:     Right lower leg: No swelling.     Left lower leg: No swelling.  Skin:    General: Skin is warm.     Findings: No rash.  Neurological:     Mental Status: She is alert.     Comments: Able to follow some commands to prompts.     Data Reviewed: Creatinine 3.13, potassium 3.4, magnesium 2.1, hemoglobin 9.0  Family Communication: Spoke with husband on the phone  Disposition: Status is: Inpatient Remains inpatient appropriate because: Endoscopy today showing bleeding in the esophagus.  Planned Discharge Destination: Long-term care    Time spent: 28 minutes  Author: Loletha Grayer, MD 05/22/2022 1:44 PM  For on call review [email or web address].

## 2022-05-22 NOTE — Progress Notes (Signed)
Patient transferred to unit from ICU, with 1:1 safety sitter in place, bed in lowest position.

## 2022-05-22 NOTE — Assessment & Plan Note (Addendum)
AKI on CKD stage IIIb.  Creatinine 3.25 on presentation down to 2.4 today.  Fluid bolus today for orthostasis.

## 2022-05-22 NOTE — Anesthesia Preprocedure Evaluation (Signed)
Anesthesia Evaluation  Patient identified by MRN, date of birth, ID band Patient awake and Patient confused  General Assessment Comment:  Patient confused, not cooperative. Husband and two daughters here with her and agree with the plan.  Patient with report coffee ground emesis, unclear history. No signs of vomiting currently.  Reviewed: Allergy & Precautions, NPO status , Patient's Chart, lab work & pertinent test results  History of Anesthesia Complications Negative for: history of anesthetic complications  Airway Mallampati: Unable to assess  TM Distance: >3 FB Neck ROM: Full    Dental  (+) Teeth Intact   Pulmonary neg sleep apnea, pneumonia, resolved, neg COPD, Patient abstained from smoking.Not current smoker   Pulmonary exam normal breath sounds clear to auscultation       Cardiovascular Exercise Tolerance: Good METShypertension, (-) CAD and (-) Past MI (-) dysrhythmias  Rhythm:Regular Rate:Normal - Systolic murmurs    Neuro/Psych  PSYCHIATRIC DISORDERS  Depression    negative neurological ROS     GI/Hepatic hiatal hernia,GERD  Medicated,,(+)     substance abuse  alcohol useWernicke Korsakoff encephalopathy 2/2 alcohol abuse   Endo/Other  diabetes    Renal/GU CRF and ARFRenal diseasenegative Renal ROS     Musculoskeletal   Abdominal   Peds  Hematology  (+) Blood dyscrasia, anemia   Anesthesia Other Findings Past Medical History: No date: Diabetes mellitus without complication (HCC) No date: GERD (gastroesophageal reflux disease) No date: Hypertension No date: Renal disorder     Comment:  stage 3   Reproductive/Obstetrics                              Anesthesia Physical Anesthesia Plan  ASA: 3  Anesthesia Plan: General   Post-op Pain Management: Minimal or no pain anticipated   Induction: Intravenous  PONV Risk Score and Plan: 3 and Propofol infusion, TIVA and  Ondansetron  Airway Management Planned: Nasal Cannula  Additional Equipment: None  Intra-op Plan:   Post-operative Plan:   Informed Consent: I have reviewed the patients History and Physical, chart, labs and discussed the procedure including the risks, benefits and alternatives for the proposed anesthesia with the patient or authorized representative who has indicated his/her understanding and acceptance.     Dental advisory given and Consent reviewed with POA  Plan Discussed with: CRNA and Surgeon  Anesthesia Plan Comments: (Discussed risks of anesthesia with patient's husband and two daughters, including possibility of difficulty with spontaneous ventilation under anesthesia necessitating airway intervention, aspiration, PONV, and rare risks such as cardiac or respiratory or neurological events, and allergic reactions. Discussed the role of CRNA in patient's perioperative care. Husband understands.)         Anesthesia Quick Evaluation

## 2022-05-22 NOTE — Plan of Care (Signed)
  Problem: Education: Goal: Ability to identify signs and symptoms of gastrointestinal bleeding will improve Outcome: Not Progressing Note: Patient is confused, unable to complete patient profile. Mitts have been placed to prevent removal of medical devices. No active bleed noticed.

## 2022-05-23 ENCOUNTER — Encounter: Payer: Self-pay | Admitting: Internal Medicine

## 2022-05-23 DIAGNOSIS — I951 Orthostatic hypotension: Secondary | ICD-10-CM

## 2022-05-23 DIAGNOSIS — K920 Hematemesis: Secondary | ICD-10-CM | POA: Diagnosis not present

## 2022-05-23 DIAGNOSIS — E876 Hypokalemia: Secondary | ICD-10-CM

## 2022-05-23 DIAGNOSIS — D62 Acute posthemorrhagic anemia: Secondary | ICD-10-CM | POA: Diagnosis not present

## 2022-05-23 DIAGNOSIS — N179 Acute kidney failure, unspecified: Secondary | ICD-10-CM | POA: Diagnosis not present

## 2022-05-23 LAB — BASIC METABOLIC PANEL
Anion gap: 7 (ref 5–15)
BUN: 63 mg/dL — ABNORMAL HIGH (ref 8–23)
CO2: 26 mmol/L (ref 22–32)
Calcium: 8.7 mg/dL — ABNORMAL LOW (ref 8.9–10.3)
Chloride: 111 mmol/L (ref 98–111)
Creatinine, Ser: 2.4 mg/dL — ABNORMAL HIGH (ref 0.44–1.00)
GFR, Estimated: 22 mL/min — ABNORMAL LOW (ref >60)
Glucose, Bld: 385 mg/dL — ABNORMAL HIGH (ref 70–99)
Potassium: 4.5 mmol/L (ref 3.5–5.1)
Sodium: 144 mmol/L (ref 135–145)

## 2022-05-23 LAB — CBC
HCT: 26.7 % — ABNORMAL LOW (ref 36.0–46.0)
Hemoglobin: 8.4 g/dL — ABNORMAL LOW (ref 12.0–15.0)
MCH: 30.9 pg (ref 26.0–34.0)
MCHC: 31.5 g/dL (ref 30.0–36.0)
MCV: 98.2 fL (ref 80.0–100.0)
Platelets: 426 10*3/uL — ABNORMAL HIGH (ref 150–400)
RBC: 2.72 MIL/uL — ABNORMAL LOW (ref 3.87–5.11)
RDW: 13.1 % (ref 11.5–15.5)
WBC: 7.6 10*3/uL (ref 4.0–10.5)
nRBC: 0 % (ref 0.0–0.2)

## 2022-05-23 LAB — GLUCOSE, CAPILLARY
Glucose-Capillary: 184 mg/dL — ABNORMAL HIGH (ref 70–99)
Glucose-Capillary: 188 mg/dL — ABNORMAL HIGH (ref 70–99)
Glucose-Capillary: 220 mg/dL — ABNORMAL HIGH (ref 70–99)
Glucose-Capillary: 315 mg/dL — ABNORMAL HIGH (ref 70–99)
Glucose-Capillary: 393 mg/dL — ABNORMAL HIGH (ref 70–99)
Glucose-Capillary: 466 mg/dL — ABNORMAL HIGH (ref 70–99)

## 2022-05-23 MED ORDER — SODIUM CHLORIDE 0.9 % IV BOLUS
500.0000 mL | Freq: Once | INTRAVENOUS | Status: AC
  Administered 2022-05-23: 500 mL via INTRAVENOUS

## 2022-05-23 MED ORDER — INSULIN ASPART 100 UNIT/ML IJ SOLN
4.0000 [IU] | Freq: Three times a day (TID) | INTRAMUSCULAR | Status: DC
  Administered 2022-05-23 (×2): 4 [IU] via SUBCUTANEOUS
  Filled 2022-05-23: qty 1

## 2022-05-23 MED ORDER — INSULIN GLARGINE-YFGN 100 UNIT/ML ~~LOC~~ SOLN
10.0000 [IU] | Freq: Every day | SUBCUTANEOUS | Status: DC
  Administered 2022-05-23: 10 [IU] via SUBCUTANEOUS
  Filled 2022-05-23: qty 0.1

## 2022-05-23 MED ORDER — MIDODRINE HCL 5 MG PO TABS
5.0000 mg | ORAL_TABLET | Freq: Three times a day (TID) | ORAL | Status: DC
  Administered 2022-05-23 – 2022-05-26 (×9): 5 mg
  Filled 2022-05-23 (×10): qty 1

## 2022-05-23 MED ORDER — INSULIN ASPART 100 UNIT/ML IJ SOLN
3.0000 [IU] | INTRAMUSCULAR | Status: DC
  Administered 2022-05-23 – 2022-05-26 (×16): 3 [IU] via SUBCUTANEOUS
  Filled 2022-05-23 (×15): qty 1

## 2022-05-23 MED ORDER — INSULIN ASPART 100 UNIT/ML IJ SOLN
0.0000 [IU] | INTRAMUSCULAR | Status: DC
  Administered 2022-05-23: 3 [IU] via SUBCUTANEOUS
  Administered 2022-05-23 (×2): 2 [IU] via SUBCUTANEOUS
  Administered 2022-05-24: 5 [IU] via SUBCUTANEOUS
  Administered 2022-05-24: 2 [IU] via SUBCUTANEOUS
  Administered 2022-05-24: 3 [IU] via SUBCUTANEOUS
  Administered 2022-05-24 – 2022-05-25 (×2): 5 [IU] via SUBCUTANEOUS
  Administered 2022-05-25: 7 [IU] via SUBCUTANEOUS
  Administered 2022-05-25: 5 [IU] via SUBCUTANEOUS
  Administered 2022-05-25: 1 [IU] via SUBCUTANEOUS
  Administered 2022-05-25: 2 [IU] via SUBCUTANEOUS
  Administered 2022-05-26 (×2): 3 [IU] via SUBCUTANEOUS
  Administered 2022-05-26: 2 [IU] via SUBCUTANEOUS
  Filled 2022-05-23 (×14): qty 1

## 2022-05-23 NOTE — Progress Notes (Addendum)
SLP Cancellation Note  Patient Details Name: Laurie Lin MRN: [id number] DOB: 10-Feb-1956   Cancelled treatment:       Reason Eval/Treat Not Completed: Fatigue/lethargy limiting ability to participate;Patient not medically ready (chart reviewed; consulted NSG)  Per NSG report, pt was hypotensive this morning and was given fluids/med. She has been more drowsy since per NSG and Sitter report. During primary last admit, pt was dx'd w/ Alcohol Abuse, diabetes type 1, Wernicke's-Korsakoff, hypertension, CKD stage IIIb. A PEG tube was placed for nutrition/hydration (last admit) in setting of her Wernicke- Korsakoff syndrome. She was Discharged on January 22 to SNF for Rehab. Pt returned this admit for hematemesis -- GI following and performed EGD on 05/22/2022 revealing: LA Grade D erosive esophagitis with bleeding. Mallory-Weiss tear. 1 cm hiatal hernia. In setting of pt's Baseline Confusion/Wernicke-Korsakoff syndrome, current GI presentation, dx'd dysphagia per MBSS ~2 weeks ago, and brief/short Rehab stay since last discharge, recommend continue Rehab POC as established at last discharge including f/u w/ ST services at SNF for dysphagia tx(ongoing at the SNF for ~2 weeks only). Recommend ongoing oral care and Single ice chips to promote oral hygiene and stimulation of swallowing. When pt has been able to complete sufficient speech therapy dysphagia services indicating need to objectively assess swallow function again, recommend Outpatient f/u w/ mbss to determine if improvement in swallowing function. MD agreed w/ current POC to include the ice chips following aspiration precautions and w/ Nursing Supervision -- this has been posted in room and in chart. Nursing updated w/ no further questions. Attempted education w/ pt, however, she was drowsy/lethargic(snoring) in bed. Sitter in room provided w/ education also.       Orinda Kenner, MS, CCC-SLP Speech Language Pathologist Rehab Services; [place] [phone] (ascom) Nicholaos Schippers 05/23/2022, 3:27 PM

## 2022-05-23 NOTE — Progress Notes (Signed)
       CROSS COVER NOTE  NAME: Laurie Lin MRN: [id number] DOB : 1956/01/24    HPI/Events of Note   "Patient is agitated, trying to get out of bed, removed one IV during day shift. Can I get a PRN for agitation and/or restart sitter order. I've had to go into the room 4 times since start of shift and put her legs back in bed".   Assessment and  Interventions   Assessment: Notes reviewed.  Sitter was discontinued earlier in the day at 57  Plan: Sitter order renewed X X

## 2022-05-23 NOTE — Inpatient Diabetes Management (Signed)
Inpatient Diabetes Program Recommendations  AACE/ADA: New Consensus Statement on Inpatient Glycemic Control   Target Ranges:  Prepandial:   less than 140 mg/dL      Peak postprandial:   less than 180 mg/dL (1-2 hours)      Critically ill patients:  140 - 180 mg/dL    Latest Reference Range & Units 05/22/22 08:00 05/22/22 16:22 05/22/22 20:58 05/22/22 23:28 05/23/22 04:04 05/23/22 07:48  Glucose-Capillary 70 - 99 mg/dL 217 (H) 204 (H) 160 (H) 293 (H) 393 (H) 466 (H)   Review of Glycemic Control  Diabetes history: DM1 (does not make any insulin; requires basal, correction, and carbohydrate coverage insulin) Outpatient Diabetes medications: Basaglar 12 units QHS, Novolog 0-9 units TID with meals, Novolog 3 units 6 x per day Current orders for Inpatient glycemic control: Semglee 8 units QHS, Novolog 0-9 units, Novolog 0-5 units QHS, Novolog 4 units TID with meals; Osmolite 237 ml 6 times a day (6:00, 9:00, 12:00, 15:00, 18:00, 21:00)  Inpatient Diabetes Program Recommendations:    Insulin: Patient is NPO and getting tube feeding boluses 6 times a day. Please consider changing Novolog 4 units TID to Novolog 3 units 6 times a day (6:00, 9:00, 12:00, 15:00, 18:00, 21:00).  Note: Patient admitted with hematemesis and acute GI bleeding. Per ED triage note patient from facility for rehab following ETOH abuse; patient has feeding tube and rec tube feedings. Patient was recently inpatient 05/06/22-05/12/22.  Per chart reivew, noted in Care Everywhere that patient sees Dr. Sheppard Evens [facility] Endocrinology) and was last seen on 11/25/21. Per office note on 11/25/21, patient is prescribed Basaglar 8-12 units daily, Novolog 1 unit per 15 grams of carbs, and 1 unit for every 40 mg/dl above 150 mg/dl.    Thanks, Barnie Alderman, RN, MSN, [place] Diabetes Coordinator Inpatient Diabetes Program [phone] (Team Pager from 8am to [place])

## 2022-05-23 NOTE — NC FL2 (Signed)
[place] LEVEL OF CARE FORM     IDENTIFICATION  Patient Name: Laurie Lin Birthdate: 06/13/1955 Sex: female Admission Date (Current Location): 05/21/2022  [facility] and [place] Number:  Engineering geologist and Address:  [facility], [address] [place], [place] [address]      Provider Number: [id number]  Attending Physician Name and Address:  Loletha Grayer, MD  Relative Name and Phone Number:       Current Level of Care: Hospital Recommended Level of Care: [place] Prior Approval Number:    Date Approved/Denied:   PASRR Number: [id number]  Discharge Plan: SNF    Current Diagnoses: Patient Active Problem List   Diagnosis Date Noted   Hematemesis 05/22/2022   Acute blood loss anemia 05/22/2022   Acute kidney injury superimposed on CKD ([place]) 05/22/2022   Weakness 05/22/2022   Hypokalemia 05/22/2022   Aspiration pneumonia ([place]) 04/25/2022   Overweight (BMI 25.0-29.9) 04/19/2022   Encephalopathy acute 04/18/2022   Alcohol abuse 04/18/2022   GERD without esophagitis 04/18/2022   Wernicke-Korsakoff syndrome ([place]) 04/18/2022   Trochanteric bursitis of left hip 01/17/2020   Anemia in chronic kidney disease 11/03/2019   Chronic kidney disease, stage 3 unspecified ([place]) 11/03/2019   Burn of second degree of left foot, initial encounter 12/28/2018   Neurogenic pain 09/30/2017   Hyponatremia 09/30/2017   Hypocalcemia 09/30/2017   Hypoalbuminemia 09/30/2017   Diabetes mellitus with stage 4 chronic kidney disease GFR 15-29 ([place]) 09/30/2017   Diabetic peripheral neuropathy ([place]) 09/30/2017   Chronic pain of hip (B) 09/30/2017   Chronic sacroiliac joint pain (B) 09/30/2017   Lumbar facet joint syndrome 09/30/2017   H/O diabetic foot ulcer 09/09/2017   Chronic low back pain (Primary Area of Pain) (Bilateral) (R>L) w/ sciatica (Bilateral) 09/09/2017   Chronic lower extremity pain (Secondary Area of Pain)  (Bilateral) (R>L) 09/09/2017   Chronic pain syndrome 09/09/2017   Long term current use of opiate analgesic 09/09/2017   Pharmacologic therapy 09/09/2017   Disorder of skeletal system 09/09/2017   Problems influencing health status 09/09/2017   Other cirrhosis of liver ([place]) 06/03/2017   Chronic lumbar radiculopathy 06/03/2017   Chronic neck pain 06/03/2017   Type 1 diabetes mellitus with hyperglycemia ([place]) 04/30/2017   Atrial flutter ([place]) 04/30/2017   Neuropathy 04/30/2017   Hiatal hernia 04/30/2017   Cataracts, bilateral 04/30/2017   Albuminuria 04/30/2017   DKA (diabetic ketoacidoses) 03/21/2017   Reactive depression 01/03/2015   Essential hypertension 09/28/2014   Hepatic steatosis 08/17/2013   Abnormal levels of other serum enzymes 08/17/2013    Orientation RESPIRATION BLADDER Height & Weight     Self  Normal Incontinent Weight: 177 lb 0.5 oz (80.3 kg) Height:  5\' 10"  (177.8 cm)  BEHAVIORAL SYMPTOMS/MOOD NEUROLOGICAL BOWEL NUTRITION STATUS  Other (Comment) (Restless)  (None) Incontinent Feeding tube (PEG)  AMBULATORY STATUS COMMUNICATION OF NEEDS Skin   Extensive Assist Verbally Skin abrasions, Bruising, Other (Comment) (Erythema/redness, rash, scratch marks.)                       Personal Care Assistance Level of Assistance    Bathing Assistance: Maximum assistance Feeding assistance: Maximum assistance (PEG tube) Dressing Assistance: Maximum assistance     Functional Limitations Info  Sight, Hearing, Speech Sight Info: Adequate Hearing Info: Adequate Speech Info: Adequate    SPECIAL CARE FACTORS FREQUENCY  PT (By licensed PT)     PT Frequency: 5 x week  Contractures Contractures Info: Not present    Additional Factors Info  Code Status, Allergies Code Status Info: Full code Allergies Info: Ibuprofen, Nsaids, Brimonidine, Latex           Current Medications (05/23/2022):  This is the current hospital active medication  list Current Facility-Administered Medications  Medication Dose Route Frequency Provider Last Rate Last Admin   acetaminophen (TYLENOL) tablet 650 mg  650 mg Per Tube Q6H PRN Para Skeans, MD       Or   acetaminophen (TYLENOL) suppository 650 mg  650 mg Rectal Q6H PRN Para Skeans, MD       feeding supplement (OSMOLITE 1.5 CAL) liquid 237 mL  237 mL Per Tube 6 X Daily Loletha Grayer, MD   237 mL at 51/76/16 0737   folic acid (FOLVITE) tablet 1 mg  1 mg Per Tube Daily Loletha Grayer, MD   1 mg at 05/23/22 1062   free water 200 mL  200 mL Per Tube 6 X Daily Loletha Grayer, MD   200 mL at 05/23/22 0933   insulin aspart (novoLOG) injection 0-5 Units  0-5 Units Subcutaneous QHS Wieting, Richard, MD       insulin aspart (novoLOG) injection 0-9 Units  0-9 Units Subcutaneous TID WC Loletha Grayer, MD   9 Units at 05/23/22 0809   insulin aspart (novoLOG) injection 4 Units  4 Units Subcutaneous TID WC Loletha Grayer, MD   4 Units at 05/23/22 0810   insulin glargine-yfgn (SEMGLEE) injection 8 Units  8 Units Subcutaneous QHS Loletha Grayer, MD   8 Units at 05/22/22 2329   latanoprost (XALATAN) 0.005 % ophthalmic solution 1 drop  1 drop Both Eyes QHS Florina Ou V, MD   1 drop at 05/22/22 2333   midodrine (PROAMATINE) tablet 5 mg  5 mg Per Tube TID WC Wieting, Richard, MD       multivitamin with minerals tablet 1 tablet  1 tablet Per Tube Daily Wieting, Richard, MD   1 tablet at 05/23/22 0934   ondansetron (ZOFRAN) tablet 4 mg  4 mg Per Tube Q6H PRN Para Skeans, MD       Or   ondansetron (ZOFRAN) injection 4 mg  4 mg Intravenous Q6H PRN Para Skeans, MD       Oral care mouth rinse  15 mL Mouth Rinse 4 times per day Para Skeans, MD   15 mL at 05/23/22 0846   pantoprozole (PROTONIX) 80 mg /NS 100 mL infusion  8 mg/hr Intravenous Continuous Loletha Grayer, MD 10 mL/hr at 05/22/22 1414 8 mg/hr at 05/22/22 1414   sodium chloride 0.9 % bolus 500 mL  500 mL Intravenous Once Wieting, Richard, MD        thiamine (VITAMIN B1) tablet 100 mg  100 mg Per Tube Daily Loletha Grayer, MD   100 mg at 05/23/22 6948     Discharge Medications: Please see discharge summary for a list of discharge medications.  Relevant Imaging Results:  Relevant Lab Results:   Additional Information SS#: [id number]. [facility] 1/22-1/31 when admitted.  Candie Chroman, LCSW

## 2022-05-23 NOTE — Assessment & Plan Note (Addendum)
Continue midodrine.  Continue working with physical therapy.  Deconditioning likely playing a large role.

## 2022-05-23 NOTE — Evaluation (Addendum)
Physical Therapy Evaluation Patient Details Name: Laurie Lin MRN: [id number] DOB: 01-04-1956 Today's Date: 05/23/2022  History of Present Illness  67 y.o. female emergency room today for hematemesis.  Patient has had multiple admissions in the past few months and the last one being in January on the 16th 2 weeks ago for same presentation of hematemesis.  Patient reports coffee-ground emesis.  Patient has history of alcohol abuse, diabetes type 1, Wernicke's Korsakoff, hypertension, CKD stage IIIb.  Patient was discharged on January 22 to skilled nursing facility.  Patient is enrolled in rehab for alcohol abuse.  Patient during last admission got a PEG tube because of patient's Wernicke- Korsakoff syndrome. Endo complete anfd found Hematemesis  Secondary to grade D esophagitis with Mallory-Weiss tear.  Continue Protonix drip today.  Serial hemoglobins.  Clinical Impression  Pt received in bed with Sitter by side and Hand mittens donned. Pt lethargic but responded to simple one step conversation with one word answer. Pt agreeable to PT eval. PT attempted to contact spouse but unable speak with him.  PT remained focused on Vitals to determine safety of OOB activities 2/2 to significant medial history and Esophageal tear. PT found BP: supine 115/56, seated 98/74 ands standing 82/42( pt had to sit down due to pt began to look pale and unable to stand sue to severe dizziness. The BP was measurable once sat down within one minute. Pt supine in trendelenburg position to make pt comfortable in bed and in flat supine after 2 mins 133/70. Pt unsafe for OOB activities. Pt needed Min assist for  rolling, Supine to site with Max assist and STS and Sit to supine with Max of 2 to total due to symptomatic drop in BP. Nurse made aware of pt's mobility status and BP. PT will continue in acute care as per pt tolerance. Pt will benefit form Long term care with some rehab to improve QOL and make to comfortable. Also if there  is progress in in rehab and once communication with spouse completed, the desponsation may need to be adjusted accordingly.   Add: After communicating with SW, pt may benefit from SNF for a safe D/C planning.      Recommendations for follow up therapy are one component of a multi-disciplinary discharge planning process, led by the attending physician.  Recommendations may be updated based on patient status, additional functional criteria and insurance authorization.  Follow Up Recommendations Long-term institutional care without follow-up therapy (with rehab unless change noted.) Can patient physically be transported by private vehicle: No    Assistance Recommended at Discharge Frequent or constant Supervision/Assistance (sitter in room)  Patient can return home with the following  Two people to help with walking and/or transfers;Two people to help with bathing/dressing/bathroom;Assistance with cooking/housework;Assistance with feeding;Direct supervision/assist for medications management;Assist for transportation;Help with stairs or ramp for entrance    Equipment Recommendations None recommended by PT  Recommendations for Other Services  OT consult;Speech consult    Functional Status Assessment Patient has had a recent decline in their functional status and demonstrates the ability to make significant improvements in function in a reasonable and predictable amount of time.     Precautions / Restrictions Precautions Precautions: Fall Precaution Comments: PEG tube (with abdominal binder) Restrictions Weight Bearing Restrictions: No      Mobility  Bed Mobility Overal bed mobility: Needs Assistance Bed Mobility: Rolling, Supine to Sit, Sit to Supine Rolling: Min guard   Supine to sit: Mod assist Sit to supine: Max assist  General bed mobility comments: became very weak and dizzy and pale with standing    Transfers Overall transfer level: Needs assistance Equipment used:  Rolling walker (2 wheels) Transfers: Sit to/from Stand Sit to Stand: Max assist, +2 safety/equipment           General transfer comment: scooted sideways and forward but unable after standing    Ambulation/Gait               General Gait Details: deferred 2/2 to unsafe due to BP drop and symptomatic  Stairs            Wheelchair Mobility    Modified Rankin (Stroke Patients Only)       Balance Overall balance assessment: Needs assistance Sitting-balance support: Feet supported Sitting balance-Leahy Scale: Good     Standing balance support: Bilateral upper extremity supported Standing balance-Leahy Scale: Poor Standing balance comment: became dizzy and pale within first minute requiring ot sit and lie down.                             Pertinent Vitals/Pain      Home Living Family/patient expects to be discharged to:: Skilled nursing facility Living Arrangements: Other (Comment) (unable to reach spouse to find PLOF)                 Additional Comments: PT attemtped to comntact spouse but unable.    Prior Function Prior Level of Function : Independent/Modified Independent;History of Falls (last six months)             Mobility Comments: As per previous Admission EMR: Ambulatory without any AD prior to recent hospitalizations; recently max assist x2 for mobility ADLs Comments: Per OT eval "generally MOD I for dressing, grooming; bathing with wipes the last few months; husband assists with IADLs (prior to admission 12/28-1/16) now MAX A -TOTAL A for ADL/IADL pre rehab"     Hand Dominance        Extremity/Trunk Assessment   Upper Extremity Assessment Upper Extremity Assessment: Defer to OT evaluation    Lower Extremity Assessment Lower Extremity Assessment: Generalized weakness       Communication   Communication: Expressive difficulties  Cognition Arousal/Alertness: Lethargic Behavior During Therapy: Flat affect Overall  Cognitive Status: Impaired/Different from baseline Area of Impairment: Orientation                 Orientation Level: Disoriented to, Person, Place, Time, Situation Current Attention Level: Sustained Memory: Decreased recall of precautions, Decreased short-term memory Following Commands: Follows one step commands consistently Safety/Judgement: Decreased awareness of safety, Decreased awareness of deficits   Problem Solving: Slow processing, Decreased initiation, Difficulty sequencing, Requires verbal cues, Requires tactile cues General Comments: follows simple commands and replies to  symptoms.        General Comments      Exercises     Assessment/Plan    PT Assessment Patient needs continued PT services  PT Problem List Decreased strength;Decreased range of motion;Decreased coordination;Decreased cognition;Decreased activity tolerance;Decreased balance;Decreased mobility;Decreased safety awareness;Decreased knowledge of use of DME       PT Treatment Interventions DME instruction;Therapeutic exercise;Gait training;Balance training;Patient/family education;Therapeutic activities;Functional mobility training;Cognitive remediation;Stair training;Neuromuscular re-education;Wheelchair mobility training    PT Goals (Current goals can be found in the Care Plan section)  Acute Rehab PT Goals Patient Stated Goal: unable due ot severe lethargy PT Goal Formulation: Patient unable to participate in goal setting Time For Goal Achievement: 06/06/22 Potential  to Achieve Goals: Fair    Frequency Min 2X/week     Co-evaluation               AM-PAC PT "6 Clicks" Mobility  Outcome Measure Help needed turning from your back to your side while in a flat bed without using bedrails?: A Little Help needed moving from lying on your back to sitting on the side of a flat bed without using bedrails?: A Lot Help needed moving to and from a bed to a chair (including a wheelchair)?:  Total Help needed standing up from a chair using your arms (e.g., wheelchair or bedside chair)?: A Lot Help needed to walk in hospital room?: Total Help needed climbing 3-5 steps with a railing? : Total 6 Click Score: 10    End of Session         PT Visit Diagnosis: Unsteadiness on feet (R26.81);Muscle weakness (generalized) (M62.81);Difficulty in walking, not elsewhere classified (R26.2)    Time: 2763-9432 PT Time Calculation (min) (ACUTE ONLY): 19 min   Charges:   PT Evaluation $PT Eval Moderate Complexity: 1 Mod         Aylinn Rydberg PT DPT 9:25 AM,05/23/22 Joaquin Music PT DPT 10:25 AM,05/23/22

## 2022-05-23 NOTE — Progress Notes (Signed)
Safety sitter DC, bed in lowest locked position, bed alarm activated, staff notified to increase safety rounding.

## 2022-05-23 NOTE — TOC Initial Note (Signed)
Transition of Care [facility]) - Initial/Assessment Note    Patient Details  Name: Laurie Lin MRN: [id number] Date of Birth: Feb 26, 1956  [facility]) CM/SW Contact:    Candie Chroman, LCSW Phone Number: 05/23/2022, 10:53 AM  Clinical Narrative: Patient only oriented to self. Husband at bedside. CSW introduced role and explained that discharge planning would be discussed. Patient has been at [facility] 1/22-1/31 when she was admitted. Husband would like for her to return there at discharge but said they are currently full. Per admissions coordinator, husband did not pay for a bed hold. CSW sent referral to other local facilities as backup plan.   TOC coworker completed readmission prevention screen on 1/5: "At baseline, patient lives with spouse who provides transportation. Patients PCP recently stopped practicing so [name] asked for resources, TOC to provide at DC. [facility] [place]. No DME at home."     No further concerns. CSW encouraged patient's husband to contact CSW as needed. CSW will continue to follow patient and her husband for support and facilitate discharge to SNF once medically stable.                Expected Discharge Plan: Skilled Nursing Facility Barriers to Discharge: Continued Medical Work up   Patient Goals and CMS Choice            Expected Discharge Plan and Services     Post Acute Care Choice: Resumption of Svcs/PTA Provider Living arrangements for the past 2 months: Single Family Home                                      Prior Living Arrangements/Services Living arrangements for the past 2 months: Single Family Home Lives with:: Spouse Patient language and need for interpreter reviewed:: Yes Do you feel safe going back to the place where you live?: Yes      Need for Family Participation in Patient Care: Yes (Comment) Care giver support system in place?: Yes (comment)   Criminal Activity/Legal Involvement Pertinent to  Current Situation/Hospitalization: No - Comment as needed  Activities of Daily Living Home Assistive Devices/Equipment: Feeding equipment, Walker (specify type) ADL Screening (condition at time of admission) Patient's cognitive ability adequate to safely complete daily activities?: No Is the patient deaf or have difficulty hearing?: No Does the patient have difficulty seeing, even when wearing glasses/contacts?: Yes Does the patient have difficulty concentrating, remembering, or making decisions?: Yes Patient able to express need for assistance with ADLs?: No Does the patient have difficulty dressing or bathing?: Yes Independently performs ADLs?: No Communication: Independent Dressing (OT): Dependent Is this a change from baseline?: Pre-admission baseline Grooming: Dependent Is this a change from baseline?: Pre-admission baseline Feeding: Dependent Is this a change from baseline?: Pre-admission baseline Bathing: Dependent Is this a change from baseline?: Pre-admission baseline Toileting: Dependent Is this a change from baseline?: Pre-admission baseline In/Out Bed: Dependent Is this a change from baseline?: Pre-admission baseline Walks in Home: Dependent Is this a change from baseline?: Pre-admission baseline Does the patient have difficulty walking or climbing stairs?: Yes Weakness of Legs: Both Weakness of Arms/Hands: None  Permission Sought/Granted Permission sought to share information with : Facility Sport and exercise psychologist, Family Supports    Share Information with NAME: Phylliss Strege  Permission granted to share info w AGENCY: SNF's  Permission granted to share info w Relationship: Husband  Permission granted to share info  w Contact Information: [phone]  Emotional Assessment Appearance:: Appears stated age   Affect (typically observed): Calm, Pleasant Orientation: : Oriented to Self, Oriented to Place, Oriented to  Time, Oriented to Situation Alcohol / Substance  Use: Not Applicable Psych Involvement: No (comment)  Admission diagnosis:  Hematemesis [K92.0] AKI (acute kidney injury) ([place]) [N17.9] Patient Active Problem List   Diagnosis Date Noted   Hematemesis 05/22/2022   Acute blood loss anemia 05/22/2022   Acute kidney injury superimposed on CKD ([place]) 05/22/2022   Weakness 05/22/2022   Hypokalemia 05/22/2022   Aspiration pneumonia ([place]) 04/25/2022   Overweight (BMI 25.0-29.9) 04/19/2022   Encephalopathy acute 04/18/2022   Alcohol abuse 04/18/2022   GERD without esophagitis 04/18/2022   Wernicke-Korsakoff syndrome ([place]) 04/18/2022   Trochanteric bursitis of left hip 01/17/2020   Anemia in chronic kidney disease 11/03/2019   Chronic kidney disease, stage 3 unspecified ([place]) 11/03/2019   Burn of second degree of left foot, initial encounter 12/28/2018   Neurogenic pain 09/30/2017   Hyponatremia 09/30/2017   Hypocalcemia 09/30/2017   Hypoalbuminemia 09/30/2017   Diabetes mellitus with stage 4 chronic kidney disease GFR 15-29 ([place]) 09/30/2017   Diabetic peripheral neuropathy ([place]) 09/30/2017   Chronic pain of hip (B) 09/30/2017   Chronic sacroiliac joint pain (B) 09/30/2017   Lumbar facet joint syndrome 09/30/2017   H/O diabetic foot ulcer 09/09/2017   Chronic low back pain (Primary Area of Pain) (Bilateral) (R>L) w/ sciatica (Bilateral) 09/09/2017   Chronic lower extremity pain (Secondary Area of Pain) (Bilateral) (R>L) 09/09/2017   Chronic pain syndrome 09/09/2017   Long term current use of opiate analgesic 09/09/2017   Pharmacologic therapy 09/09/2017   Disorder of skeletal system 09/09/2017   Problems influencing health status 09/09/2017   Other cirrhosis of liver ([place]) 06/03/2017   Chronic lumbar radiculopathy 06/03/2017   Chronic neck pain 06/03/2017   Type 1 diabetes mellitus with hyperglycemia ([place]) 04/30/2017   Atrial flutter ([place]) 04/30/2017   Neuropathy 04/30/2017   Hiatal hernia 04/30/2017   Cataracts, bilateral  04/30/2017   Albuminuria 04/30/2017   DKA (diabetic ketoacidoses) 03/21/2017   Reactive depression 01/03/2015   Essential hypertension 09/28/2014   Hepatic steatosis 08/17/2013   Abnormal levels of other serum enzymes 08/17/2013   PCP:  Pcp, No Pharmacy:   [id number] [place] [place] - [address] [place] [address] Phone: [phone] Fax: [phone]     Social Determinants of Health (SDOH) Social History: SDOH Screenings   Food Insecurity: No Food Insecurity (05/09/2022)  Housing: Low Risk  (05/09/2022)  Transportation Needs: No Transportation Needs (05/09/2022)  Utilities: Not At Risk (05/09/2022)  Tobacco Use: Low Risk  (05/23/2022)   SDOH Interventions:     Readmission Risk Interventions    05/23/2022   10:51 AM 05/07/2022    9:48 AM  Readmission Risk Prevention Plan  Transportation Screening Complete Complete  PCP or Specialist Appt within 3-5 Days Complete Complete  Social Work Consult for [place] Planning/Counseling Complete Complete  Palliative Care Screening Not Applicable Not Applicable  Medication Review Press photographer) Complete Complete

## 2022-05-23 NOTE — Anesthesia Postprocedure Evaluation (Signed)
Anesthesia Post Note  Patient: Laurie Lin  Procedure(s) Performed: ESOPHAGOGASTRODUODENOSCOPY (EGD) WITH PROPOFOL  Patient location during evaluation: Endoscopy Anesthesia Type: General Level of consciousness: confused and awake (as per preop) Pain management: pain level controlled Vital Signs Assessment: post-procedure vital signs reviewed and stable Respiratory status: spontaneous breathing, nonlabored ventilation, respiratory function stable and patient connected to nasal cannula oxygen Cardiovascular status: blood pressure returned to baseline and stable Postop Assessment: no apparent nausea or vomiting Anesthetic complications: no   No notable events documented.   Last Vitals:  Vitals:   05/23/22 0344 05/23/22 0745  BP: (!) 113/59 126/82  Pulse: 99 (!) 102  Resp: 18 20  Temp: 36.5 C 36.9 C  SpO2: 100% 97%    Last Pain:  Vitals:   05/23/22 0745  TempSrc: Oral  PainSc:                  Arita Miss

## 2022-05-23 NOTE — Progress Notes (Signed)
Progress Note   Patient: Laurie Lin JHE:[id number] DOB: [birth year] DOA: 05/21/2022     1 DOS: the patient was seen and examined on 05/23/2022   Brief hospital course: 67 y.o. female emergency room today for hematemesis. Patient has had multiple admissions in the past few months and the last one being in January on the 16th 2 weeks ago for same presentation of hematemesis.  Patient reports coffee-ground emesis.  Patient has history of alcohol abuse, diabetes type 1, Wernicke's Korsakoff, hypertension, CKD stage IIIb.  Patient was discharged on January 22 to skilled nursing facility. Patient is enrolled in rehab for alcohol abuse.  Patient during last admission got a PEG tube because of patient's Wernicke- Korsakoff syndrome  Assessment and Plan: * Hematemesis Secondary to grade D esophagitis with Mallory-Weiss tear seen on EGD today.  Continue Protonix drip today.  Serial hemoglobins.  Acute blood loss anemia Continue to monitor hemoglobin closely.  Last hemoglobin 8.4.  IV iron given on 2/1.  Acute kidney injury superimposed on CKD ([place]) AKI on CKD stage IIIb.  Creatinine 3.25 on presentation down to 2.4 today.  Fluid bolus today for orthostasis.  Orthostatic hypotension Fluid bolus and midodrine started.  Alcohol abuse No recent ETOH.    Type 1 diabetes mellitus with hyperglycemia (HCC) Sliding scale insulin.  With tube feeding starting last night sugars elevated.  Will increase long-acting insulin to 10 units at night and will give 3 units plus sliding scale every 4 hours.  Wernicke-Korsakoff syndrome (HCC) Continue thiamine daily, folic acid and multivitamin.  Essential hypertension Holding antihypertensive medication  Hypokalemia Replaced  Weakness Physical therapy evaluation appreciated        Subjective: Patient slow to respond to questions but moves all of her extremities.  With physical therapy today was weak and dropped her blood pressure.  Physical  Exam: Vitals:   05/22/22 2245 05/23/22 0344 05/23/22 0402 05/23/22 0745  BP:  (!) 113/59  126/82  Pulse:  99  (!) 102  Resp:  18  20  Temp:  97.7 F (36.5 C)  98.4 F (36.9 C)  TempSrc:  Oral  Oral  SpO2:  100%  97%  Weight:   80.3 kg   Height: 5\' 10"  (1.778 m)      Physical Exam HENT:     Head: Normocephalic.     Mouth/Throat:     Pharynx: No oropharyngeal exudate.  Eyes:     General: Lids are normal.     Conjunctiva/sclera: Conjunctivae normal.  Cardiovascular:     Rate and Rhythm: Regular rhythm. Tachycardia present.     Heart sounds: Normal heart sounds, S1 normal and S2 normal.  Pulmonary:     Breath sounds: No decreased breath sounds, wheezing, rhonchi or rales.  Abdominal:     Palpations: Abdomen is soft.     Tenderness: There is no abdominal tenderness.  Musculoskeletal:     Right lower leg: No swelling.     Left lower leg: No swelling.  Skin:    General: Skin is warm.     Findings: No rash.  Neurological:     Mental Status: She is alert.     Comments: Able to follow some commands to prompts.     Data Reviewed: Creatinine 2.4, hemoglobin 8.4, platelet count 426  Family Communication: Updated husband on the phone  Disposition: Status is: Inpatient Remains inpatient appropriate because: Patient with orthostatic hypotension today treating for acute kidney injury and GI bleed  Planned Discharge Destination: Long-term care  Time spent: 28 minutes  Author: Loletha Grayer, MD 05/23/2022 3:21 PM  For on call review [email or web address].

## 2022-05-24 DIAGNOSIS — I951 Orthostatic hypotension: Secondary | ICD-10-CM | POA: Diagnosis not present

## 2022-05-24 DIAGNOSIS — D62 Acute posthemorrhagic anemia: Secondary | ICD-10-CM | POA: Diagnosis not present

## 2022-05-24 DIAGNOSIS — N179 Acute kidney failure, unspecified: Secondary | ICD-10-CM | POA: Diagnosis not present

## 2022-05-24 DIAGNOSIS — K920 Hematemesis: Secondary | ICD-10-CM | POA: Diagnosis not present

## 2022-05-24 DIAGNOSIS — R1310 Dysphagia, unspecified: Secondary | ICD-10-CM

## 2022-05-24 LAB — GLUCOSE, CAPILLARY
Glucose-Capillary: 119 mg/dL — ABNORMAL HIGH (ref 70–99)
Glucose-Capillary: 161 mg/dL — ABNORMAL HIGH (ref 70–99)
Glucose-Capillary: 233 mg/dL — ABNORMAL HIGH (ref 70–99)
Glucose-Capillary: 295 mg/dL — ABNORMAL HIGH (ref 70–99)
Glucose-Capillary: 226 mg/dL — ABNORMAL HIGH (ref 70–99)
Glucose-Capillary: 254 mg/dL — ABNORMAL HIGH (ref 70–99)

## 2022-05-24 LAB — CBC
HCT: 27.3 % — ABNORMAL LOW (ref 36.0–46.0)
Hemoglobin: 8.7 g/dL — ABNORMAL LOW (ref 12.0–15.0)
MCH: 31.1 pg (ref 26.0–34.0)
MCHC: 31.9 g/dL (ref 30.0–36.0)
MCV: 97.5 fL (ref 80.0–100.0)
Platelets: 405 10*3/uL — ABNORMAL HIGH (ref 150–400)
RBC: 2.8 MIL/uL — ABNORMAL LOW (ref 3.87–5.11)
RDW: 12.9 % (ref 11.5–15.5)
WBC: 9.9 10*3/uL (ref 4.0–10.5)
nRBC: 0 % (ref 0.0–0.2)

## 2022-05-24 LAB — BASIC METABOLIC PANEL
Anion gap: 6 (ref 5–15)
BUN: 52 mg/dL — ABNORMAL HIGH (ref 8–23)
CO2: 26 mmol/L (ref 22–32)
Calcium: 8.9 mg/dL (ref 8.9–10.3)
Chloride: 114 mmol/L — ABNORMAL HIGH (ref 98–111)
Creatinine, Ser: 1.9 mg/dL — ABNORMAL HIGH (ref 0.44–1.00)
GFR, Estimated: 29 mL/min — ABNORMAL LOW (ref >60)
Glucose, Bld: 53 mg/dL — ABNORMAL LOW (ref 70–99)
Potassium: 3.9 mmol/L (ref 3.5–5.1)
Sodium: 146 mmol/L — ABNORMAL HIGH (ref 135–145)

## 2022-05-24 LAB — AMMONIA: Ammonia: 21 umol/L (ref 9–35)

## 2022-05-24 MED ORDER — PANTOPRAZOLE SODIUM 40 MG IV SOLR
40.0000 mg | Freq: Two times a day (BID) | INTRAVENOUS | Status: DC
  Administered 2022-05-25 – 2022-05-26 (×2): 40 mg via INTRAVENOUS
  Filled 2022-05-24 (×2): qty 10

## 2022-05-24 MED ORDER — INSULIN GLARGINE-YFGN 100 UNIT/ML ~~LOC~~ SOLN
8.0000 [IU] | Freq: Every day | SUBCUTANEOUS | Status: DC
  Administered 2022-05-24 – 2022-05-25 (×2): 8 [IU] via SUBCUTANEOUS
  Filled 2022-05-24 (×3): qty 0.08

## 2022-05-24 NOTE — Progress Notes (Signed)
Progress Note   Patient: Laurie Lin TGG:[id number] DOB: 1956/04/04 DOA: 05/21/2022     2 DOS: the patient was seen and examined on 05/24/2022   Brief hospital course: 67 y.o. female emergency room today for hematemesis. Patient has had multiple admissions in the past few months and the last one being in January on the 16th 2 weeks ago for same presentation of hematemesis.  Patient reports coffee-ground emesis.  Patient has history of alcohol abuse, diabetes type 1, Wernicke's Korsakoff, hypertension, CKD stage IIIb.  Patient was discharged on January 22 to skilled nursing facility. Patient is enrolled in rehab for alcohol abuse.  Patient during last admission got a PEG tube because of patient's Wernicke- Korsakoff syndrome  2/3.  Sitter reinstated last night.  Will need to be sitter free for 24 hours prior to going out to rehab.  I explained to the patient that I do not want her standing up and last physical therapy is there.  Creatinine improved to 1.9, hemoglobin stable at 8.7  Assessment and Plan: * Hematemesis Secondary to grade D esophagitis with Mallory-Weiss tear seen on EGD on 2/1.  Continue Protonix drip today.  Hemoglobin stable at 8.7  Acute blood loss anemia Continue to monitor hemoglobin closely.  Last hemoglobin 8.7.  IV iron given on 2/1.  Acute kidney injury superimposed on CKD ([place]) AKI on CKD stage IIIb.  Creatinine 3.25 on presentation down to 1.9 today.    Orthostatic hypotension Continue midodrine.  Continue working with physical therapy  Alcohol abuse No recent ETOH.    Type 1 diabetes mellitus with hyperglycemia (HCC) Sliding scale insulin.  Will cut back to 8 units long-acting insulin at night and will give 3 units plus sliding scale every 4 hours.  Wernicke-Korsakoff syndrome (HCC) Continue thiamine daily, folic acid and multivitamin.  Essential hypertension Holding antihypertensive medication  Dysphagia Modified swallow evaluation on  Monday.  Hypokalemia Replaced  Weakness Physical therapy evaluation appreciated        Subjective: Sitter was reinstated last night.  I told the patient that she is not to stand up unless we have the physical therapist or nursing staff around.  I will need to have to get rid of the sitter prior to getting out to rehab.  Patient feels okay.  Physical Exam: Vitals:   05/23/22 2044 05/24/22 0341 05/24/22 0356 05/24/22 0813  BP: 135/70 117/75  124/69  Pulse: (!) 103 99  (!) 102  Resp: 17 16  16   Temp: 97.9 F (36.6 C) 99.1 F (37.3 C)  97.7 F (36.5 C)  TempSrc: Oral Oral    SpO2: 99% 98%  100%  Weight:   80.2 kg   Height:       Physical Exam HENT:     Head: Normocephalic.     Mouth/Throat:     Pharynx: No oropharyngeal exudate.  Eyes:     General: Lids are normal.     Conjunctiva/sclera: Conjunctivae normal.  Cardiovascular:     Rate and Rhythm: Regular rhythm. Tachycardia present.     Heart sounds: Normal heart sounds, S1 normal and S2 normal.  Pulmonary:     Breath sounds: No decreased breath sounds, wheezing, rhonchi or rales.  Abdominal:     Palpations: Abdomen is soft.     Tenderness: There is no abdominal tenderness.  Musculoskeletal:     Right lower leg: No swelling.     Left lower leg: No swelling.  Skin:    General: Skin is warm.  Findings: No rash.  Neurological:     Mental Status: She is alert.     Comments: Able to follow some commands to prompts.     Data Reviewed: Sodium 146, creatinine 1.9, hemoglobin 8.7, platelet count 405 Family Communication: Updated husband on the phone  Disposition: Status is: Inpatient Remains inpatient appropriate because: Will need to be sitter free for 24 hours prior to going out to rehab.  No rehab beds over the weekend.  Modified swallow evaluation on Monday.  Planned Discharge Destination: Long-term care    Time spent: 27 minutes  Author: Loletha Grayer, MD 05/24/2022 1:25 PM  For on call review  [email or web address].

## 2022-05-24 NOTE — Assessment & Plan Note (Signed)
Modified swallow evaluation on Monday.

## 2022-05-24 NOTE — Plan of Care (Signed)
Problem: Education: Goal: Ability to describe self-care measures that may prevent or decrease complications (Diabetes Survival Skills Education) will improve Outcome: Progressing Goal: Individualized Educational Video(s) Outcome: Progressing   Problem: Coping: Goal: Ability to adjust to condition or change in health will improve Outcome: Progressing   Problem: Fluid Volume: Goal: Ability to maintain a balanced intake and output will improve Outcome: Progressing   Problem: Health Behavior/Discharge Planning: Goal: Ability to identify and utilize available resources and services will improve Outcome: Progressing Goal: Ability to manage health-related needs will improve Outcome: Progressing   Problem: Metabolic: Goal: Ability to maintain appropriate glucose levels will improve Outcome: Progressing   Problem: Nutritional: Goal: Maintenance of adequate nutrition will improve Outcome: Progressing Goal: Progress toward achieving an optimal weight will improve Outcome: Progressing   Problem: Skin Integrity: Goal: Risk for impaired skin integrity will decrease Outcome: Progressing   Problem: Tissue Perfusion: Goal: Adequacy of tissue perfusion will improve Outcome: Progressing   Problem: Education: Goal: Knowledge of General Education information will improve Description: Including pain rating scale, medication(s)/side effects and non-pharmacologic comfort measures Outcome: Progressing   Problem: Health Behavior/Discharge Planning: Goal: Ability to manage health-related needs will improve Outcome: Progressing   Problem: Clinical Measurements: Goal: Ability to maintain clinical measurements within normal limits will improve Outcome: Progressing Goal: Will remain free from infection Outcome: Progressing Goal: Diagnostic test results will improve Outcome: Progressing Goal: Respiratory complications will improve Outcome: Progressing Goal: Cardiovascular complication will  be avoided Outcome: Progressing   Problem: Activity: Goal: Risk for activity intolerance will decrease Outcome: Progressing   Problem: Nutrition: Goal: Adequate nutrition will be maintained Outcome: Progressing   Problem: Coping: Goal: Level of anxiety will decrease Outcome: Progressing   Problem: Elimination: Goal: Will not experience complications related to bowel motility Outcome: Progressing Goal: Will not experience complications related to urinary retention Outcome: Progressing   Problem: Pain Managment: Goal: General experience of comfort will improve Outcome: Progressing   Problem: Safety: Goal: Ability to remain free from injury will improve Outcome: Progressing   Problem: Skin Integrity: Goal: Risk for impaired skin integrity will decrease Outcome: Progressing   Problem: Education: Goal: Ability to identify signs and symptoms of gastrointestinal bleeding will improve Outcome: Progressing   Problem: Bowel/Gastric: Goal: Will show no signs and symptoms of gastrointestinal bleeding Outcome: Progressing   Problem: Fluid Volume: Goal: Will show no signs and symptoms of excessive bleeding Outcome: Progressing   Problem: Education: Goal: Knowledge of General Education information will improve Description: Including pain rating scale, medication(s)/side effects and non-pharmacologic comfort measures Outcome: Progressing   Problem: Health Behavior/Discharge Planning: Goal: Ability to manage health-related needs will improve Outcome: Progressing   Problem: Clinical Measurements: Goal: Ability to maintain clinical measurements within normal limits will improve Outcome: Progressing Goal: Will remain free from infection Outcome: Progressing Goal: Diagnostic test results will improve Outcome: Progressing Goal: Respiratory complications will improve Outcome: Progressing Goal: Cardiovascular complication will be avoided Outcome: Progressing   Problem:  Activity: Goal: Risk for activity intolerance will decrease Outcome: Progressing   Problem: Nutrition: Goal: Adequate nutrition will be maintained Outcome: Progressing   Problem: Coping: Goal: Level of anxiety will decrease Outcome: Progressing   Problem: Elimination: Goal: Will not experience complications related to bowel motility Outcome: Progressing Goal: Will not experience complications related to urinary retention Outcome: Progressing   Problem: Pain Managment: Goal: General experience of comfort will improve Outcome: Progressing   Problem: Safety: Goal: Ability to remain free from injury will improve Outcome: Progressing   Problem: Skin Integrity: Goal:  Risk for impaired skin integrity will decrease Outcome: Progressing

## 2022-05-24 NOTE — Progress Notes (Signed)
SLP Cancellation Note  Patient Details Name: Laurie Lin MRN: [id number] DOB: 1955-06-27   Cancelled treatment:       Reason Eval/Treat Not Completed:  (chart reviewed; consulted MD/husband) Per MD/husband report, pt began "receiving applesauce and drinking water" at the SNF facility in the ~2 weeks she was there(prior to this readmit). Per recommendations from the previous MBSS last admit, repeat assessment was recommended when pt was able to consistently sustain attention to po tasks in therapy. Per current chart notes and presentation this admit, pt has been agitated, drowsy, and required a Air cabin crew.  As she continues to improve, a MBSS will be ordered for Monday, 5th, to objectively assess swallow function in hopes to establish a least restrictive, safe oral diet. The above was discussed w/ MD who agreed. Pt continues w/ therapeutic ice chips, also for pleasure w/ frequent oral care.       Orinda Kenner, MS, CCC-SLP Speech Language Pathologist Rehab Services; [place] [phone] ([email or web address]) Esli Clements 05/24/2022, 1:22 PM

## 2022-05-25 DIAGNOSIS — N179 Acute kidney failure, unspecified: Secondary | ICD-10-CM | POA: Diagnosis not present

## 2022-05-25 DIAGNOSIS — K920 Hematemesis: Secondary | ICD-10-CM | POA: Diagnosis not present

## 2022-05-25 DIAGNOSIS — R1319 Other dysphagia: Secondary | ICD-10-CM

## 2022-05-25 DIAGNOSIS — D62 Acute posthemorrhagic anemia: Secondary | ICD-10-CM | POA: Diagnosis not present

## 2022-05-25 DIAGNOSIS — I951 Orthostatic hypotension: Secondary | ICD-10-CM | POA: Diagnosis not present

## 2022-05-25 LAB — BASIC METABOLIC PANEL
Anion gap: 7 (ref 5–15)
BUN: 45 mg/dL — ABNORMAL HIGH (ref 8–23)
CO2: 25 mmol/L (ref 22–32)
Calcium: 8.7 mg/dL — ABNORMAL LOW (ref 8.9–10.3)
Chloride: 110 mmol/L (ref 98–111)
Creatinine, Ser: 1.79 mg/dL — ABNORMAL HIGH (ref 0.44–1.00)
GFR, Estimated: 31 mL/min — ABNORMAL LOW (ref >60)
Glucose, Bld: 240 mg/dL — ABNORMAL HIGH (ref 70–99)
Potassium: 4.6 mmol/L (ref 3.5–5.1)
Sodium: 142 mmol/L (ref 135–145)

## 2022-05-25 LAB — GLUCOSE, CAPILLARY
Glucose-Capillary: 142 mg/dL — ABNORMAL HIGH (ref 70–99)
Glucose-Capillary: 193 mg/dL — ABNORMAL HIGH (ref 70–99)
Glucose-Capillary: 281 mg/dL — ABNORMAL HIGH (ref 70–99)
Glucose-Capillary: 285 mg/dL — ABNORMAL HIGH (ref 70–99)
Glucose-Capillary: 310 mg/dL — ABNORMAL HIGH (ref 70–99)
Glucose-Capillary: 80 mg/dL (ref 70–99)

## 2022-05-25 LAB — HEMOGLOBIN: Hemoglobin: 8.4 g/dL — ABNORMAL LOW (ref 12.0–15.0)

## 2022-05-25 MED ORDER — HALOPERIDOL LACTATE 5 MG/ML IJ SOLN
1.0000 mg | Freq: Four times a day (QID) | INTRAMUSCULAR | Status: DC | PRN
  Administered 2022-05-25: 1 mg via INTRAVENOUS
  Filled 2022-05-25 (×2): qty 1

## 2022-05-25 MED ORDER — QUETIAPINE FUMARATE 25 MG PO TABS
12.5000 mg | ORAL_TABLET | Freq: Every evening | ORAL | Status: DC | PRN
  Administered 2022-05-25: 12.5 mg
  Filled 2022-05-25: qty 1

## 2022-05-25 NOTE — TOC Progression Note (Signed)
Transition of Care [facility]) - Progression Note    Patient Details  Name: TICIA VIRGO MRN: [id number] Date of Birth: 1955-07-27  Transition of Care [facility]) CM/SW Bur[place], LCSW Phone Number: 05/25/2022, 3:48 PM  Clinical Narrative:    [facility] stated they are not able to make a bed offer.  Called patient's spouse, presented current bed offer at [facility] and pending bed requests at [facility] and [facility]. Patient's spouse stated he would like to accept bed offer at [facility].  Per MD, patient should be medically ready tomorrow 2/5. CSW updated Lavella Lemons at [facility] - she is checking to confirm bed availability for tomorrow. Started Josem Kaufmann in [facility] portal.    Expected Discharge Plan: [place] Barriers to Discharge: Continued Medical Work up  Expected Discharge Plan and Services     Post Acute Care Choice: Resumption of Svcs/PTA Provider Living arrangements for the past 2 months: Single Family Home                                       Social Determinants of Health (SDOH) Interventions SDOH Screenings   Food Insecurity: No Food Insecurity (05/09/2022)  Housing: Low Risk  (05/09/2022)  Transportation Needs: No Transportation Needs (05/09/2022)  Utilities: Not At Risk (05/09/2022)  Tobacco Use: Low Risk  (05/23/2022)    Readmission Risk Interventions    05/23/2022   10:51 AM 05/07/2022    9:48 AM  Readmission Risk Prevention Plan  Transportation Screening Complete Complete  PCP or Specialist Appt within 3-5 Days Complete Complete  Social Work Consult for [place] Planning/Counseling Complete Complete  Palliative Care Screening Not Applicable Not Applicable  Medication Review Press photographer) Complete Complete

## 2022-05-25 NOTE — Progress Notes (Signed)
Progress Note   Patient: Laurie Lin EQA:[id number] DOB: 1956-03-30 DOA: 05/21/2022     3 DOS: the patient was seen and examined on 05/25/2022   Brief hospital course: 67 y.o. female emergency room today for hematemesis. Patient has had multiple admissions in the past few months and the last one being in January on the 16th 2 weeks ago for same presentation of hematemesis.  Patient reports coffee-ground emesis.  Patient has history of alcohol abuse, diabetes type 1, Wernicke's Korsakoff, hypertension, CKD stage IIIb.  Patient was discharged on January 22 to skilled nursing facility. Patient is enrolled in rehab for alcohol abuse.  Patient during last admission got a PEG tube because of patient's Wernicke- Korsakoff syndrome  2/3.  Sitter reinstated last night.  Will need to be sitter free for 24 hours prior to going out to rehab.  I explained to the patient that I do not want her standing up and last physical therapy is there.  Creatinine improved to 1.9, hemoglobin stable at 8.7 2/4.  Hemoglobin 8.4 and creatinine 1.79  Assessment and Plan: * Hematemesis Secondary to grade D esophagitis with Mallory-Weiss tear seen on EGD on 2/1.  Continue Protonix drip converting over to IV Protonix.  Hemoglobin stable at 8.4  Acute blood loss anemia Continue to monitor hemoglobin closely.  Last hemoglobin 8.4.  IV iron given on 2/1.  Acute kidney injury superimposed on CKD ([place]) AKI on CKD stage IIIb.  Creatinine 3.25 on presentation down to 1.79 today.    Orthostatic hypotension Continue midodrine.  Continue working with physical therapy  Alcohol abuse No recent ETOH.    Type 1 diabetes mellitus with hyperglycemia (HCC) Sliding scale insulin.  Will cut back to 8 units long-acting insulin at night and will give 3 units plus sliding scale every 4 hours.  Wernicke-Korsakoff syndrome (HCC) Continue thiamine daily, folic acid and multivitamin.  Patient more confused today but also asking more  questions about things.  Will use Haldol as needed for agitation and as needed Seroquel at night if not sleeping.  Essential hypertension Holding antihypertensive medication  Dysphagia Modified swallow evaluation on Monday.  Hypokalemia Replaced  Weakness Physical therapy evaluation appreciated        Subjective: Patient more talkative and answering more questions and inquiring about things.  Husband states that she is more confused today.  Admitted with hematemesis.  Physical Exam: Vitals:   05/25/22 0436 05/25/22 0500 05/25/22 0754 05/25/22 1200  BP: (!) 108/56  (!) 147/65 104/72  Pulse: 100  (!) 101   Resp: 18  16   Temp: 98 F (36.7 C)  98.2 F (36.8 C)   TempSrc: Oral  Oral   SpO2: 98%  100%   Weight:  82.1 kg    Height:       Physical Exam HENT:     Head: Normocephalic.     Mouth/Throat:     Pharynx: No oropharyngeal exudate.  Eyes:     General: Lids are normal.     Conjunctiva/sclera: Conjunctivae normal.  Cardiovascular:     Rate and Rhythm: Regular rhythm. Tachycardia present.     Heart sounds: Normal heart sounds, S1 normal and S2 normal.  Pulmonary:     Breath sounds: No decreased breath sounds, wheezing, rhonchi or rales.  Abdominal:     Palpations: Abdomen is soft.     Tenderness: There is no abdominal tenderness.  Musculoskeletal:     Right lower leg: No swelling.     Left lower leg:  No swelling.  Skin:    General: Skin is warm.     Findings: No rash.  Neurological:     Mental Status: She is alert.     Comments: Able to straight leg raise to command.     Data Reviewed: Creatinine 1.79, hemoglobin 8.4  Family Communication: Spoke with husband on the phone  Disposition: Status is: Inpatient Remains inpatient appropriate because: Will need a rehab bed, insurance authorization.  Obtaining a swallow evaluation on Monday  Planned Discharge Destination: Long-term care    Time spent: 28 minutes  Author: Loletha Grayer, MD 05/25/2022  1:01 PM  For on call review [email or web address].

## 2022-05-25 NOTE — TOC Progression Note (Signed)
Transition of Care [facility]) - Progression Note    Patient Details  Name: Laurie Lin MRN: [id number] Date of Birth: May 07, 1955  Transition of Care [facility]) CM/SW Sebastian, LCSW Phone Number: 05/25/2022, 10:06 AM  Clinical Narrative:    Per MD, patient should be medically ready tomorrow pending getting up with PT and swallow study.  Per TOC handoff, plan for return to [facility] if they have a bed. CSW reached out to Tammy at [facility] to inquire if they will have a bed for patient - waiting for response. Will start auth once response is received from [place].    Expected Discharge Plan: [place] Barriers to Discharge: Continued Medical Work up  Expected Discharge Plan and Services     Post Acute Care Choice: Resumption of Svcs/PTA Provider Living arrangements for the past 2 months: Single Family Home                                       Social Determinants of Health (SDOH) Interventions SDOH Screenings   Food Insecurity: No Food Insecurity (05/09/2022)  Housing: Low Risk  (05/09/2022)  Transportation Needs: No Transportation Needs (05/09/2022)  Utilities: Not At Risk (05/09/2022)  Tobacco Use: Low Risk  (05/23/2022)    Readmission Risk Interventions    05/23/2022   10:51 AM 05/07/2022    9:48 AM  Readmission Risk Prevention Plan  Transportation Screening Complete Complete  PCP or Specialist Appt within 3-5 Days Complete Complete  Social Work Consult for [place] Planning/Counseling Complete Complete  Palliative Care Screening Not Applicable Not Applicable  Medication Review Press photographer) Complete Complete

## 2022-05-26 ENCOUNTER — Inpatient Hospital Stay: Payer: Medicare Other

## 2022-05-26 DIAGNOSIS — K920 Hematemesis: Secondary | ICD-10-CM | POA: Diagnosis not present

## 2022-05-26 DIAGNOSIS — N179 Acute kidney failure, unspecified: Secondary | ICD-10-CM | POA: Diagnosis not present

## 2022-05-26 DIAGNOSIS — I951 Orthostatic hypotension: Secondary | ICD-10-CM | POA: Diagnosis not present

## 2022-05-26 DIAGNOSIS — D62 Acute posthemorrhagic anemia: Secondary | ICD-10-CM | POA: Diagnosis not present

## 2022-05-26 LAB — BASIC METABOLIC PANEL
Anion gap: 8 (ref 5–15)
BUN: 43 mg/dL — ABNORMAL HIGH (ref 8–23)
CO2: 25 mmol/L (ref 22–32)
Calcium: 9.1 mg/dL (ref 8.9–10.3)
Chloride: 109 mmol/L (ref 98–111)
Creatinine, Ser: 1.85 mg/dL — ABNORMAL HIGH (ref 0.44–1.00)
GFR, Estimated: 30 mL/min — ABNORMAL LOW (ref >60)
Glucose, Bld: 117 mg/dL — ABNORMAL HIGH (ref 70–99)
Potassium: 4.7 mmol/L (ref 3.5–5.1)
Sodium: 142 mmol/L (ref 135–145)

## 2022-05-26 LAB — CBC
HCT: 27.3 % — ABNORMAL LOW (ref 36.0–46.0)
Hemoglobin: 8.5 g/dL — ABNORMAL LOW (ref 12.0–15.0)
MCH: 30.5 pg (ref 26.0–34.0)
MCHC: 31.1 g/dL (ref 30.0–36.0)
MCV: 97.8 fL (ref 80.0–100.0)
Platelets: 374 10*3/uL (ref 150–400)
RBC: 2.79 MIL/uL — ABNORMAL LOW (ref 3.87–5.11)
RDW: 13.2 % (ref 11.5–15.5)
WBC: 11.6 10*3/uL — ABNORMAL HIGH (ref 4.0–10.5)
nRBC: 0 % (ref 0.0–0.2)

## 2022-05-26 LAB — GLUCOSE, CAPILLARY
Glucose-Capillary: 110 mg/dL — ABNORMAL HIGH (ref 70–99)
Glucose-Capillary: 155 mg/dL — ABNORMAL HIGH (ref 70–99)
Glucose-Capillary: 168 mg/dL — ABNORMAL HIGH (ref 70–99)
Glucose-Capillary: 244 mg/dL — ABNORMAL HIGH (ref 70–99)
Glucose-Capillary: 249 mg/dL — ABNORMAL HIGH (ref 70–99)

## 2022-05-26 LAB — MAGNESIUM: Magnesium: 1.6 mg/dL — ABNORMAL LOW (ref 1.7–2.4)

## 2022-05-26 MED ORDER — BASAGLAR KWIKPEN 100 UNIT/ML ~~LOC~~ SOPN: 8.0000 [IU] | PEN_INJECTOR | Freq: Every day | SUBCUTANEOUS | Status: DC

## 2022-05-26 MED ORDER — ONDANSETRON HCL 4 MG PO TABS: 4.0000 mg | ORAL_TABLET | Freq: Four times a day (QID) | ORAL | 0 refills | Status: DC | PRN

## 2022-05-26 MED ORDER — INSULIN ASPART 100 UNIT/ML IJ SOLN: 3.0000 [IU] | INTRAMUSCULAR | 11 refills | Status: DC

## 2022-05-26 MED ORDER — QUETIAPINE FUMARATE 25 MG PO TABS: 12.5000 mg | ORAL_TABLET | Freq: Every evening | ORAL | 0 refills | Status: DC | PRN

## 2022-05-26 MED ORDER — ACETAMINOPHEN 500 MG PO TABS: 500.0000 mg | ORAL_TABLET | Freq: Two times a day (BID) | ORAL | 0 refills | Status: DC | PRN

## 2022-05-26 MED ORDER — MIDODRINE HCL 5 MG PO TABS: 5.0000 mg | ORAL_TABLET | Freq: Three times a day (TID) | ORAL | 0 refills | Status: DC

## 2022-05-26 MED ORDER — MAGNESIUM SULFATE 2 GM/50ML IV SOLN
2.0000 g | Freq: Once | INTRAVENOUS | Status: AC
  Administered 2022-05-26: 2 g via INTRAVENOUS
  Filled 2022-05-26: qty 50

## 2022-05-26 MED ORDER — INSULIN ASPART 100 UNIT/ML IJ SOLN: 0.0000 [IU] | INTRAMUSCULAR | 11 refills | Status: DC

## 2022-05-26 NOTE — Discharge Summary (Signed)
Physician Discharge Summary   Patient: Laurie Lin MRN: [id number] DOB: 04-09-1956  Admit date:     05/21/2022  Discharge date: 05/26/22  Discharge Physician: Loletha Grayer   PCP: Pcp, No   Recommendations at discharge:   Follow-up with team at rehab 1 day  Discharge Diagnoses: Principal Problem:   Hematemesis Active Problems:   Acute blood loss anemia   Acute kidney injury superimposed on CKD (HCC)   Orthostatic hypotension   Type 1 diabetes mellitus with hyperglycemia (HCC)   Alcohol abuse   Wernicke-Korsakoff syndrome (HCC)   Essential hypertension   Weakness   Hypokalemia   Dysphagia   Hypomagnesemia   Hospital Course: 67 y.o. female emergency room today for hematemesis. Patient has had multiple admissions in the past few months and the last one being in January on the 16th 2 weeks ago for same presentation of hematemesis.  Patient reports coffee-ground emesis.  Patient has history of alcohol abuse, diabetes type 1, Wernicke's Korsakoff, hypertension, CKD stage IIIb.  Patient was discharged on January 22 to skilled nursing facility. Patient is enrolled in rehab for alcohol abuse.  Patient during last admission got a PEG tube because of patient's Wernicke- Korsakoff syndrome  2/3.  Sitter reinstated last night.  Will need to be sitter free for 24 hours prior to going out to rehab.  I explained to the patient that I do not want her standing up and last physical therapy is there.  Creatinine improved to 1.9, hemoglobin stable at 8.7 2/4.  Hemoglobin 8.4 and creatinine 1.79.  Patient given Seroquel last night. 2/5.  Hemoglobin 8.5 and creatinine 1.85.  Patient had modified barium swallow and did not have any overt signs of aspiration on pured diet with thin liquids but was not too cooperative with exam.  Will need continued follow-up with speech therapy at facility.  If patient starts increasing diet over time can taper tube feeds.  Assessment and Plan: *  Hematemesis Secondary to grade D esophagitis with Mallory-Weiss tear seen on EGD on 2/1.  Patient was on Protonix drip and then IV Protonix and can go over to her Protonix twice a day upon disposition hemoglobin stable at 8.5.  Recommend checking a CBC in 1 week  Acute blood loss anemia Continue to monitor hemoglobin closely.  Last hemoglobin 8.5.  IV iron given on 2/1.  Recommend checking a CBC in 1 week.  Acute kidney injury superimposed on CKD ([place]) AKI on CKD stage IIIb.  Creatinine 3.25 on presentation down to 1.85 upon disposition.  Recommend checking a BMP and magnesium in 1 week  Orthostatic hypotension Continue midodrine.  Continue working with physical therapy.  Deconditioning likely playing a large role.  Alcohol abuse No recent ETOH.    Type 1 diabetes mellitus with hyperglycemia (HCC) 8 units long-acting insulin at night and will give 3 units plus sliding scale every 4 hours.  Wernicke-Korsakoff syndrome (HCC) Continue thiamine daily, folic acid and multivitamin.  Mental status improved today after a dose of Seroquel last night.  Will continue as needed Seroquel at night.  Mental status not back to her baseline from when she was home previously.  Very slow to progress.  Essential hypertension Holding antihypertensive medication with orthostatic hypotension.  Hypomagnesemia Replacing IV magnesium today and recheck magnesium along with BMP in 1 week.  Dysphagia Modified swallow evaluation on Monday did not show any signs of overt aspiration with pured diet and thickened liquids but patient was not very cooperative with her study.  Continue to follow with speech at facility.  Can use pured diet with thin liquids as tolerated.  If she starts eating more can taper tube feeds.  Hypokalemia Replaced  Weakness Physical therapy evaluation appreciated         Consultants: Gastroenterology, speech therapy, physical therapy Procedures performed: EGD, modified barium  swallow Disposition: Long-term care Diet recommendation:  Pure with thin liquids as tolerated with close following with speech therapy. DISCHARGE MEDICATION: Allergies as of 05/26/2022       Reactions   Ibuprofen Nausea Only   Nsaids    Ulcerative stomach and small intestines   Brimonidine Itching   Red, itchy, sensitivity to light Red, itchy, sensitivity to light   Latex Itching   Other reaction(s): Other (see comments)        Medication List     STOP taking these medications    hydrOXYzine 10 MG tablet Commonly known as: ATARAX   loperamide HCl 1 MG/7.5ML suspension Commonly known as: IMODIUM   metoCLOPramide 5 MG/5ML solution Commonly known as: REGLAN       TAKE these medications    acetaminophen 500 MG tablet Commonly known as: TYLENOL Place 1 tablet (500 mg total) into feeding tube 2 (two) times daily as needed for mild pain, fever or headache. What changed:  how to take this reasons to take this   Basaglar KwikPen 100 UNIT/ML Inject 8 Units into the skin at bedtime. What changed: how much to take   cyanocobalamin 1000 MCG tablet Place 1 tablet (1,000 mcg total) into feeding tube daily.   Dexcom G6 Sensor Misc by Does not apply route.   feeding supplement (OSMOLITE 1.5 CAL) Liqd Place 237 mLs into feeding tube 6 (six) times daily.   folic acid 1 MG tablet Commonly known as: FOLVITE Place 1 tablet (1 mg total) into feeding tube daily.   free water Soln Place 200 mLs into feeding tube 6 (six) times daily.   insulin aspart 100 UNIT/ML injection Commonly known as: novoLOG Inject 3 Units into the skin every 4 (four) hours. With bolus tube feed. What changed: when to take this   insulin aspart 100 UNIT/ML injection Commonly known as: novoLOG Inject 0-9 Units into the skin every 4 (four) hours. CBG 70 - 120: 0 units CBG 121 - 150: 1 unit CBG 151 - 200: 2 units CBG 201 - 250: 3 units CBG 251 - 300: 5 units CBG 301 - 350: 7 units CBG 351 -  400: 9 units What changed: when to take this   Insulin Pen Needle 32G X 4 MM Misc ok to sub any brand or size needle preferred by insurance/patient, use up to 5x/day, dx E10.65   magnesium hydroxide 400 MG/5ML suspension Commonly known as: MILK OF MAGNESIA Place 30 mLs into feeding tube daily as needed for mild constipation.   midodrine 5 MG tablet Commonly known as: PROAMATINE Place 1 tablet (5 mg total) into feeding tube 3 (three) times daily with meals.   multivitamin with minerals Tabs tablet Place 1 tablet into feeding tube daily.   ondansetron 4 MG tablet Commonly known as: ZOFRAN Place 1 tablet (4 mg total) into feeding tube every 6 (six) hours as needed for nausea.   OneTouch Delica Lancets 24Q Misc 1 Device by Does not apply route 4 (four) times daily.   pantoprazole sodium 40 mg Commonly known as: PROTONIX Place 40 mg into feeding tube 2 (two) times daily.   QUEtiapine 25 MG tablet Commonly known as: SEROQUEL  Place 0.5 tablets (12.5 mg total) into feeding tube at bedtime as needed (agitaion, insomnia).   Rhopressa 0.02 % Soln Generic drug: Netarsudil Dimesylate Place 1 drop into both eyes every evening.   thiamine 100 MG tablet Commonly known as: VITAMIN B1 Place 1 tablet (100 mg total) into feeding tube daily.   Travoprost (BAK Free) 0.004 % Soln ophthalmic solution Commonly known as: TRAVATAN Place 1 drop into both eyes at bedtime.   vitamin D3 25 MCG tablet Commonly known as: CHOLECALCIFEROL Place 2 tablets (2,000 Units total) into feeding tube daily.        Contact information for after-discharge care     [facility] .   Service: Skilled Nursing Contact information: [place] [phone]                    Discharge Exam: Danley Danker Weights   05/24/22 0356 05/25/22 0500 05/26/22 0449  Weight: 80.2 kg 82.1 kg 82.2 kg   Physical Exam HENT:     Head:  Normocephalic.     Mouth/Throat:     Pharynx: No oropharyngeal exudate.  Eyes:     General: Lids are normal.     Conjunctiva/sclera: Conjunctivae normal.  Cardiovascular:     Rate and Rhythm: Regular rhythm. Tachycardia present.     Heart sounds: Normal heart sounds, S1 normal and S2 normal.  Pulmonary:     Breath sounds: No decreased breath sounds, wheezing, rhonchi or rales.  Abdominal:     Palpations: Abdomen is soft.     Tenderness: There is no abdominal tenderness.  Musculoskeletal:     Right lower leg: No swelling.     Left lower leg: No swelling.  Skin:    General: Skin is warm.     Findings: No rash.  Neurological:     Mental Status: She is alert.     Comments: Able to straight leg raise to command.      Condition at discharge: fair  The results of significant diagnostics from this hospitalization (including imaging, microbiology, ancillary and laboratory) are listed below for reference.   Imaging Studies: CT ABDOMEN PELVIS WO CONTRAST  Result Date: 05/06/2022 CLINICAL DATA:  Abdominal pain, acute, nonlocalized EXAM: CT ABDOMEN AND PELVIS WITHOUT CONTRAST TECHNIQUE: Multidetector CT imaging of the abdomen and pelvis was performed following the standard protocol without IV contrast. RADIATION DOSE REDUCTION: This exam was performed according to the departmental dose-optimization program which includes automated exposure control, adjustment of the mA and/or kV according to patient size and/or use of iterative reconstruction technique. COMPARISON:  04/17/2022 FINDINGS: Lower chest: Small to moderate bilateral pleural effusions. Compressive atelectasis in the lower lobes. Hepatobiliary: No focal hepatic abnormality. Gallbladder unremarkable. Pancreas: No focal abnormality or ductal dilatation. Spleen: No focal abnormality.  Normal size. Adrenals/Urinary Tract: No adrenal abnormality. No focal renal abnormality. No stones or hydronephrosis. Urinary bladder decompressed with Foley  catheter in place. Stomach/Bowel: Gastrostomy tube in the stomach. Stomach, large and small bowel grossly unremarkable. Vascular/Lymphatic: No evidence of aneurysm or adenopathy. Reproductive: Uterus and adnexa unremarkable.  No mass. Other: No free fluid or free air. Musculoskeletal: No acute bony abnormality. IMPRESSION: Small to moderate bilateral pleural effusions with compressive atelectasis in the lower lobes. No acute findings in the abdomen or pelvis. Electronically Signed   By: Rolm Baptise M.D.   On: 05/06/2022 18:10   DG Swallowing Func-Speech Pathology  Result Date: 05/02/2022 Table formatting from  the original result was not included. Objective Swallowing Evaluation: Type of Study: MBS-Modified Barium Swallow Study  Patient Details Name: RENN STILLE MRN: [id number] Date of Birth: Jan 18, 1956 Today's Date: 05/02/2022 Time: SLP Start Time (ACUTE ONLY): 55 -SLP Stop Time (ACUTE ONLY): 1250 SLP Time Calculation (min) (ACUTE ONLY): 25 min Past Medical History: Past Medical History: Diagnosis Date  Diabetes mellitus without complication (HCC)   GERD (gastroesophageal reflux disease)   Hypertension   Renal disorder   stage 3  Past Surgical History: Past Surgical History: Procedure Laterality Date  DILATION AND CURETTAGE OF UTERUS    Miscarriage  EYE SURGERY    bilateral caterac  FINGER SURGERY    IR GASTROSTOMY TUBE MOD SED  04/29/2022  KNEE ARTHROSCOPY Left 1989  [facility]  PILONIDAL CYST DRAINAGE   HPI: 67 year old female with past medical history of ETOH abuse, diabetes mellitus type 1, stage IIIa chronic kidney disease, hypertension and ongoing Alcohol use who was brought in for worsening confusion for the last month.  Workup revealed urinary tract infection, acute kidney injury, hypomagnesemia, hyperkalemia.and Wernicke's encephalopathy.  While on floor she continued to deteriorate and has become unresponsive with jerky movements. PCCM consultation for concern patient developing Delerium Tremens. Pt  required oral intubation; intubated 01/01 thru 04/24/2022.   CXR on 04/25/22: New right perihilar airspace opacity suspicious for early  pneumonia.  2. Hazy accentuated density at the right lung base with obscuration  of the right lateral hemidiaphragm, potentially from atelectasis or  pneumonia.  3. Airway thickening is present, suggesting bronchitis or reactive  airways disease.  Subjective: pt continues to be severely encephalopathic, unable to follow directions, awake but inattentive to stimuli  Recommendations for follow up therapy are one component of a multi-disciplinary discharge planning process, led by the attending physician.  Recommendations may be updated based on patient status, additional functional criteria and insurance authorization. Assessment / Plan / Recommendation   05/02/2022   9:00 PM Clinical Impressions Clinical Impression Pt continues to present with profound encephalopathy, suspect Wernicke Kerskoff. Pt with bilateral mittens in place. During this study, pt presents with no change since this therapist treated pt last Friday. She continues with decreased response to PO stimuli at liips, when mitten was removed, pt was able to hold cup but demonstrated no attention to it and exhibit withdrawal when therapist provided hand over hand assistance to attempt to bring cup to her mouth. There were a few occasions that pt allowed liquid to enter her mouth. this was followed by oral holding and decreased attention to bolus. Pt was very distracted with looking around the x-ray machine and was largely non-responsive to maximal multimodal cues to swallow. Pt held boluses for upwards of 45 seconds. When swallow was initiated, it was swift with good airway protection. At this time, pt is inappropraite to return to PO intake as her main source of nutrition and hydration. Recommend ice chips after oral care to faciliate use of swallow musculature. Given pt's continued encephalopathy, she is unable to  participate in skilled therapy at this time. Please re-consult when pt can consistently sustain attention to and exhibit function of a spoon or cup SLP Visit Diagnosis Dysphagia, oropharyngeal phase (R13.12) Impact on safety and function Severe aspiration risk;Risk for inadequate nutrition/hydration     05/02/2022   9:00 PM Treatment Recommendations Treatment Recommendations Patient unable to participate in swallow therapy at this time     05/02/2022   9:00 PM Prognosis Prognosis for Safe Diet Advancement Guarded Barriers to Reach  Goals Cognitive deficits;Severity of deficits   05/02/2022   9:00 PM Diet Recommendations SLP Diet Recommendations Ice chips PRN after oral care Medication Administration Via alternative means Compensations Minimize environmental distractions;Slow rate;Small sips/bites Postural Changes Seated upright at 90 degrees     05/02/2022   9:00 PM Other Recommendations Oral Care Recommendations Oral care QID Follow Up Recommendations Skilled nursing-short term rehab (<3 hours/day) Functional Status Assessment Patient has had a recent decline in their functional status and/or demonstrates limited ability to make significant improvements in function in a reasonable and predictable amount of time   04/25/2022   5:00 PM Frequency and Duration  Speech Therapy Frequency (ACUTE ONLY) min 2x/week Treatment Duration 2 weeks     05/02/2022   9:00 PM Oral Phase Oral Phase Impaired    05/02/2022   9:00 PM Pharyngeal Phase Pharyngeal Phase [facility]    05/02/2022   9:00 PM Cervical Esophageal Phase  Cervical Esophageal Phase [facility] Happi Overton 05/02/2022, 9:35 PM                     EEG adult  Result Date: 05/01/2022 Lora Havens, MD     05/01/2022  1:12 PM Patient Name: BRETTE CAST MRN: [id number] Epilepsy Attending: Lora Havens Referring Physician/Provider: Amie Portland, MD Date: 05/01/2022 Duration: 25.58 mins Patient history: 67 year old female with altered mental status.  EEG to evaluate for seizure. Level  of alertness: Awake, asleep AEDs during EEG study: None Technical aspects: This EEG study was done with scalp electrodes positioned according to the 10-20 International system of electrode placement. Electrical activity was reviewed with band pass filter of 1-70Hz , sensitivity of 7 uV/mm, display speed of 23mm/sec with a 60Hz  notched filter applied as appropriate. EEG data were recorded continuously and digitally stored.  Video monitoring was available and reviewed as appropriate. Description: The posterior dominant rhythm consists of 7.5 Hz activity of moderate voltage (25-35 uV) seen predominantly in posterior head regions, symmetric and reactive to eye opening and eye closing. Sleep was characterized by vertex waves, sleep spindles (12 to 14 Hz), maximal frontocentral region. EEG showed intermittent generalized 3 to 6 Hz theta-delta slowing. Hyperventilation and photic stimulation were not performed.   ABNORMALITY - Intermittent slow, generalized IMPRESSION: This study is suggestive of mild diffuse encephalopathy, nonspecific etiology. No seizures or epileptiform discharges were seen throughout the recording. Priyanka O Yadav   US RENAL  Result Date: 05/01/2022 CLINICAL DATA:  Acute kidney injury. EXAM: RENAL / URINARY TRACT ULTRASOUND COMPLETE COMPARISON:  CT scan 04/17/2022 FINDINGS: Right Kidney: Renal measurements: 10.1 x 5.0 x 4.9 cm = volume: 130.2 mL. Normal renal cortical thickness and echogenicity without focal lesions or hydronephrosis. Left Kidney: Renal measurements: 10.8 x 6.2 x 5.0 cm = volume: 175.69 mL. Normal renal cortical thickness and echogenicity without focal lesions or hydronephrosis. Small lower pole left renal calculus also demonstrated on prior CT scan. Bladder: Decompressed by a Foley catheter. Other: None. IMPRESSION: 1. Unremarkable sonographic appearance of both kidneys. No renal lesions or hydronephrosis. 2. The bladder is decompressed by a Foley catheter. Electronically Signed    By: Marijo Sanes M.D.   On: 05/01/2022 09:28   IR GASTROSTOMY TUBE MOD SED  Result Date: 04/29/2022 INDICATION: Dysphagia. EXAM: PERCUTANEOUS GASTROSTOMY TUBE PLACEMENT COMPARISON:  KUB, 04/26/2022.  CT AP, 04/17/2022. MEDICATIONS: Ancef 2 gm IV; Antibiotics were administered within 1 hour of the procedure. Zofran 4 mg IV and glucagon 0.5 mg IV CONTRAST:  15 mL of Isovue 300 administered  into the gastric lumen. ANESTHESIA/SEDATION: Moderate (conscious) sedation was employed during this procedure. A total of Versed 1 mg and Fentanyl 50 mcg was administered intravenously. Moderate Sedation Time: 12 minutes. The patient's level of consciousness and vital signs were monitored continuously by radiology nursing throughout the procedure under my direct supervision. FLUOROSCOPY TIME:  Fluoroscopic dose; 84.2 mGy COMPLICATIONS: None immediate. PROCEDURE: Informed written consent was obtained from the patient and/or patient's representative following explanation of the procedure, risks, benefits and alternatives. A time out was performed prior to the initiation of the procedure. Maximal barrier sterile technique utilized including caps, mask, sterile gowns, sterile gloves, large sterile drape, hand hygiene and Betadine prep. The LEFT upper quadrant was sterilely prepped and draped. A oral gastric catheter was inserted into the stomach under fluoroscopy. The existing nasogastric feeding tube was removed. The left costal margin and barium opacified transverse colon were identified and avoided. Air was injected into the stomach for insufflation and visualization under fluoroscopy. Under sterile conditions and local anesthesia, 2 T tacks were utilized to pexy the anterior aspect of the stomach against the ventral abdominal wall. Contrast injection confirmed appropriate positioning of each of the T tacks. An incision was made between the T tacks and a 17 gauge trocar needle was utilized to access the stomach. Needle position  was confirmed within the stomach with aspiration of air and injection of a small amount of contrast. A stiff Glidewire was advanced into the gastric lumen and under intermittent fluoroscopic guidance, the access needle was exchanged for a Kumpe catheter. With the use of the Kumpe catheter, a stiff Glidewire was advanced into the horizontal segment of the duodenum. Under intermittent fluoroscopic guidance, the Kumpe catheter was exchanged for a telescoping peel-away sheath, ultimately allowing placement of a 18 Fr balloon retention gastrostomy tube. The retention balloon was insufflated with a mixture of dilute saline and contrast and pulled taut against the anterior wall of the stomach. The external disc was cinched. Contrast injection confirms positioning within the stomach. Several spot radiographic images were obtained in various obliquities for documentation. T he patient tolerated procedure well without immediate post procedural complication. FINDINGS: After successful fluoroscopic guided placement, the gastrostomy tube is appropriately positioned with internal retention balloon against the ventral aspect of the gastric lumen. IMPRESSION: Successful placement of a 18 Fr balloon-retention gastrostomy tube. The gastrostomy may be used for medication administration within 6 hours placement, and initiation of tube feeds in 24 hrs. PLAN: Patient will return to [facility] ([facility]) for routine feeding tube evaluation and exchange in 6 months. Michaelle Birks, MD [facility] Electronically Signed   By: Michaelle Birks M.D.   On: 04/29/2022 12:05   DG Abd 1 View  Result Date: 04/26/2022 CLINICAL DATA:  Nasogastric tube placement. EXAM: ABDOMEN - 1 VIEW COMPARISON:  April 22, 2022 FINDINGS: A feeding tube is seen with its distal tip overlying the expected region of the gastric antrum. The bowel gas pattern is normal. No radio-opaque calculi or  other significant radiographic abnormality are seen. IMPRESSION: Feeding tube positioning, as described above. Electronically Signed   By: Virgina Norfolk M.D.   On: 04/26/2022 22:43    Microbiology: Results for orders placed or performed during the hospital encounter of 05/21/22  MRSA Next Gen by PCR, Nasal     Status: None   Collection Time: 05/22/22  3:50 AM   Specimen: Nasal Mucosa; Nasal Swab  Result Value Ref Range Status   MRSA by PCR  Next Gen NOT DETECTED NOT DETECTED Final    Comment: (NOTE) The GeneXpert MRSA Assay (FDA approved for NASAL specimens only), is one component of a comprehensive MRSA colonization surveillance program. It is not intended to diagnose MRSA infection nor to guide or monitor treatment for MRSA infections. Test performance is not FDA approved in patients less than 38 years old. Performed at [facility], [address] [place], [place] [address]     Labs: CBC: Recent Labs  Lab 05/21/22 2105 05/22/22 0147 05/23/22 0505 05/24/22 0524 05/25/22 0608 05/26/22 0447  WBC 13.8*  --  7.6 9.9  --  11.6*  NEUTROABS 9.2*  --   --   --   --   --   HGB 10.4* 9.0* 8.4* 8.7* 8.4* 8.5*  HCT 33.6* 29.3* 26.7* 27.3*  --  27.3*  MCV 99.1  --  98.2 97.5  --  97.8  PLT 546*  --  426* 405*  --  165   Basic Metabolic Panel: Recent Labs  Lab 05/21/22 2243 05/22/22 0744 05/23/22 0505 05/24/22 0524 05/25/22 0608 05/26/22 0447  NA  --  145 144 146* 142 142  K  --  3.4* 4.5 3.9 4.6 4.7  CL  --  107 111 114* 110 109  CO2  --  26 26 26 25 25   GLUCOSE  --  240* 385* 53* 240* 117*  BUN  --  86* 63* 52* 45* 43*  CREATININE  --  3.13* 2.40* 1.90* 1.79* 1.85*  CALCIUM  --  9.5 8.7* 8.9 8.7* 9.1  MG 2.3 2.1  --   --   --  1.6*   Liver Function Tests: Recent Labs  Lab 05/21/22 2105  AST 17  ALT 9  ALKPHOS 61  BILITOT 0.8  PROT 7.7  ALBUMIN 3.2*   CBG: Recent Labs  Lab 05/26/22 0031 05/26/22 0431 05/26/22 0902 05/26/22 1127  05/26/22 1252  GLUCAP 244* 110* 249* 155* 168*    Discharge time spent: greater than 30 minutes.  Signed: Loletha Grayer, MD [facility] 05/26/2022

## 2022-05-26 NOTE — Evaluation (Addendum)
Objective Swallowing Evaluation: Type of Study: MBS-Modified Barium Swallow Study   Patient Details  Name: Laurie Lin MRN: [id number] Date of Birth: [birth year]  Today's Date: 05/26/2022 Time: SLP Start Time (ACUTE ONLY): 1200 -SLP Stop Time (ACUTE ONLY): 6063  SLP Time Calculation (min) (ACUTE ONLY): 65 min   Past Medical History:  Past Medical History:  Diagnosis Date   Diabetes mellitus without complication ([place])    GERD (gastroesophageal reflux disease)    Hypertension    Renal disorder    stage 3    Past Surgical History:  Past Surgical History:  Procedure Laterality Date   DILATION AND CURETTAGE OF UTERUS     Miscarriage   ESOPHAGOGASTRODUODENOSCOPY (EGD) WITH PROPOFOL N/A 05/22/2022   Procedure: ESOPHAGOGASTRODUODENOSCOPY (EGD) WITH PROPOFOL;  Surgeon: Toledo, Benay Pike, MD;  Location: [facility] ENDOSCOPY;  Service: Gastroenterology;  Laterality: N/A;   EYE SURGERY     bilateral caterac   FINGER SURGERY     IR GASTROSTOMY TUBE MOD SED  04/29/2022   KNEE ARTHROSCOPY Left 1989   [facility]   PILONIDAL CYST DRAINAGE     HPI: Pt is a 67yo female who re-presents to the ER from SNF facility due to reported coffee-ground emesis and worsening blood work.  Patient on PEG tube feeds.  GI following w/ EGD on 05/22/2022 revealing: LA Grade D erosive esophagitis with bleeding. Mallory-Weiss tear. 1 cm hiatal hernia.  During her primary last admit 04/17/22(for ~2 weeks), pt was dx'd w/ Alcohol Abuse, diabetes type 1, Wernicke's-Korsakoff, hypertension, CKD stage IIIb. A PEG tube was placed for nutrition/hydration that admit in setting of her Wernicke- Korsakoff syndrome and inablity to sufficiently/safely take oral intake. She was Discharged on 05/06/22 to SNF for Rehab.  Prior to D/C to SNF that admit, a MBSS was completed w/ profound encephalopathy impacting pt's abitily to participate in the evaluation then. NPO was recommended w/ the PEG TFs.   Subjective: pt continues to be severely  encephalopathic; able to follow baisc 1-step directions when inclined to do so. Awake/alert but inattentive during session and perseverated on thoughts/issues re: her Husband. Cognitive-behavioral deficits.    Recommendations for follow up therapy are one component of a multi-disciplinary discharge planning process, led by the attending physician.  Recommendations may be updated based on patient status, additional functional criteria and insurance authorization.  Assessment / Plan / Recommendation     05/26/2022    3:00 PM  Clinical Impressions  Clinical Impression Pt continues to present with severe encephalopathy, suspect impact from [place] syndrome w/ Cognitive-Behavioral deficits. Pt exhibited reduced-poor participation in the evaluation today despite verbal encouragement by SLP. Bilateral mitts in place, one removed for her to hold the Cup to drink during the evaluation(then replaced post evaluation).  Pt was verbal and engaged briefly but perseverated on wanting her Husband to be contacted and wondering why he was not present. Verbal redirecting was Not successful after several attempts, and pt stopped participating.  She was able to hold her own Cup to drink(L hand).   During this study, pt presented decreased interest and response to taking PO trials. When inclined and encouraged, pt held the Cup herself given setup and took 1 swallow/trial of thin liquid; she accepted 1 trial/bolus of puree b/f refusing further.  She demonstrated adequate but brief attention to the trials accepted b/f she was distracted by other thoughts/needs. However, during the trials, suspect the impact of the decreased attention to boluses impacted her presentation w/ them.   Patient presents with  grossly functional oropharyngeal swallowing WHEN SHE IS INCLINED TO PARTICIPATE. Suspect the Cognitive-behavioral deficits impacted, and will impact, her overall engagement to take po intake and overall amount of  oral intake.  Oral stage is characterized by adequate lip closure, bolus preparation and containment, and anterior to posterior transit. However, the A-P transit was quick w/ thin liquids and slow w/ increased texture(puree) -- suspect impact of decreased attention. There was mild oral holding w/ the increased texture trial(puree) for ~15-20 seconds as she manipulated it. Then the swallow was completed. Diffuse thin liquid bolus residue remained orally>pyriform sinuses post initial swallow -- oral/all residue cleared w/ f/u, Dry swallow completed independently by pt. Swallows were swift.  Swallow initiation occurs at the level of the BOT- valleculae. Pharyngeal stage is further noted for adequate tongue base retraction, adequate hyolaryngeal excursion, and adequate pharyngeal constriction. Epiglottic deflection is complete; there is No penetration or aspiration. There is mild valleculae and intermittently pyriform sinus residue, greater with the thin liquids as thin liquid residue streamed from the oral cavity>pyriform sinuses after the initial swallow -- this was Cleared w/ a f/u, Dry swallow completed Independently by pt w/out need for cue.  Pharyngeal stripping wave is complete. Amplitude/duration of cricopharyngeus opening is WFL. There is adequate/complete clearance of boluses through the cervical esophagus.   At this time, pt is inappropraite to immediately return to PO intake as her main source of nutrition and hydration. Recommend continued ice chips after oral care to faciliate use of swallow musculature, stimulation of swallowing, and hygiene. Given pt's encephalopathy, recommend Supervised, therapeutic po trials w/ skilled ST services at SNF to attempt to upgrade to least restrictive po diet. Dietician f/u re: TFs.   SLP Visit Diagnosis Dysphagia, oral phase (R13.11)  Impact on safety and function Risk for inadequate nutrition/hydration;Mild aspiration risk;Moderate aspiration risk          05/26/2022    3:00 PM  Treatment Recommendations  Treatment Recommendations Defer treatment plan to f/u with SLP        05/26/2022    3:00 PM  Prognosis  Prognosis for Safe Diet Advancement Fair  Barriers to Reach Goals Cognitive deficits;Language deficits;Motivation;Time post onset;Severity of deficits;Behavior  Barriers/Prognosis Comment Cognitive-behavioral deficits suspect in setting of Wernicke's-Korsakoff syndrome       05/26/2022    3:00 PM  Diet Recommendations  SLP Diet Recommendations --  Liquid Administration via Cup  Medication Administration Crushed with puree  Compensations Minimize environmental distractions;Slow rate;Small sips/bites  Postural Changes Remain semi-upright after after feeds/meals (Comment);Seated upright at 90 degrees         05/26/2022    3:00 PM  Other Recommendations  Recommended Consults --  Oral Care Recommendations Oral care BID;Oral care before and after PO;Staff/trained caregiver to provide oral care  Follow Up Recommendations Skilled nursing-short term rehab (<3 hours/day)  Functional Status Assessment Patient has had a recent decline in their functional status and/or demonstrates limited ability to make significant improvements in function in a reasonable and predictable amount of time       05/26/2022    3:00 PM  Frequency and Duration   Speech Therapy Frequency (ACUTE ONLY) TBD at SNF  Treatment Duration TBD at [facility]         05/26/2022    3:00 PM  Oral Phase  Oral Phase Impaired       05/26/2022    3:00 PM  Pharyngeal Phase  Pharyngeal Phase [facility]        05/26/2022  3:00 PM  Cervical Esophageal Phase   Cervical Esophageal Phase [facility]         Orinda Kenner, MS, CCC-SLP Speech Language Pathologist Rehab Services; [place] [phone] (ascom) Margean Korell 05/26/2022, 3:53 PM

## 2022-05-26 NOTE — Inpatient Diabetes Management (Signed)
Inpatient Diabetes Program Recommendations  AACE/ADA: New Consensus Statement on Inpatient Glycemic Control (2015)  Target Ranges:  Prepandial:   less than 140 mg/dL      Peak postprandial:   less than 180 mg/dL (1-2 hours)      Critically ill patients:  140 - 180 mg/dL    Latest Reference Range & Units 05/25/22 00:20 05/25/22 04:14 05/25/22 08:27 05/25/22 11:55 05/25/22 16:37 05/25/22 20:18  Glucose-Capillary 70 - 99 mg/dL 281 (H) 80 310 (H) 285 (H) 142 (H) 193 (H)  (H): Data is abnormally high  Latest Reference Range & Units 05/26/22 00:31 05/26/22 04:31 05/26/22 09:02 05/26/22 11:27  Glucose-Capillary 70 - 99 mg/dL 244 (H) 110 (H) 249 (H) 155 (H)  (H): Data is abnormally high   Diabetes history: DM1 (does not make any insulin; requires basal, correction, and carbohydrate coverage insulin)  Outpatient Diabetes meds: Basaglar 12 units QHS                        Novolog 0-9 units TID with meals             Novolog 3 units 6 x per day   Current orders: Semglee 8 units QHS   Novolog 0-9 units Q4 hours   Novolog 3 units Q4 hours     Osmolite 237 ml 6 times a day (6:00, 9:00, 12:00, 15:00, 18:00, 21:00)    MD- Note Patient getting tube feeding boluses 6 times a day  Please consider changing Novolog 3 units Q4 hours to the following times to coincide with the bolus feeds: Novolog 3 units 6 times a day  6:00, 9:00, 12:00, 15:00, 18:00, 21:00     --Will follow patient during hospitalization--  Wyn Quaker RN, MSN, [place] Diabetes Coordinator Inpatient Glycemic Control Team Team Pager: [phone] (8a-5p)

## 2022-05-26 NOTE — TOC Transition Note (Signed)
Transition of Care [facility]) - CM/SW Discharge Note   Patient Details  Name: Laurie Lin MRN: [id number] Date of Birth: March 12, 1956  Transition of Care [facility]) CM/SW Contact:  Candie Chroman, LCSW Phone Number: 05/26/2022, 3:59 PM   Clinical Narrative:  Patient has orders to discharge to [facility] SNF today. RN will call report to [phone] (Room 16B). EMS transport has been arranged and she is next on the list. No further concerns. CSW signing off.   Final next level of care: [place] Barriers to Discharge: Barriers Resolved   Patient Goals and CMS Choice   Choice offered to / list presented to : Spouse  Discharge Placement     Existing PASRR number confirmed : 05/23/22          Patient chooses bed at: [facility] Patient to be transferred to facility by: EMS Name of family member notified: Elray Buba Patient and family notified of of transfer: 05/26/22  Discharge Plan and Services Additional resources added to the After Visit Summary for       Post Acute Care Choice: Resumption of Svcs/PTA Provider                               Social Determinants of Health (SDOH) Interventions SDOH Screenings   Food Insecurity: No Food Insecurity (05/09/2022)  Housing: Low Risk  (05/09/2022)  Transportation Needs: No Transportation Needs (05/09/2022)  Utilities: Not At Risk (05/09/2022)  Tobacco Use: Low Risk  (05/23/2022)     Readmission Risk Interventions    05/23/2022   10:51 AM 05/07/2022    9:48 AM  Readmission Risk Prevention Plan  Transportation Screening Complete Complete  PCP or Specialist Appt within 3-5 Days Complete Complete  Social Work Consult for [place] Planning/Counseling Complete Complete  Palliative Care Screening Not Applicable Not Applicable  Medication Review Press photographer) Complete Complete

## 2022-05-26 NOTE — Progress Notes (Signed)
Mobility Specialist - Progress Note   05/26/22 1100  Mobility  Activity Dangled on edge of bed  Level of Assistance Standby assist, set-up cues, supervision of patient - no hands on  $Mobility charge 1 Mobility     Supine HR = 115 bpm  Supine BP = 133/73  Sitting HR = 116 bpm Sitting BP = 130/83   Pt lying in bed upon arrival, utilizing RA. Pleasant but confused. Pt completed bed mobility with minA---max verbal/tactile cueing for step-by-step sequencing to exit bed. Orthostatics attempted in both lying/sitting; unable to obtain standing BP d/t transport entering to take pt down for x-ray. Pt returned supine modI with transport at bedside.    Kathee Delton Mobility Specialist 05/26/22, 11:53 AM

## 2022-05-26 NOTE — Care Management Important Message (Signed)
Important Message  Patient Details  Name: Laurie Lin MRN: [id number] Date of Birth: 02-01-1956   Medicare Important Message Given:  Yes     Dannette Barbara 05/26/2022, 2:52 PM

## 2022-05-26 NOTE — TOC Progression Note (Signed)
Transition of Care [facility]) - Progression Note    Patient Details  Name: Laurie Lin MRN: [id number] Date of Birth: [birth year]  Transition of Care [facility]) CM/SW [place], LCSW Phone Number: 05/26/2022, 9:18 AM  Clinical Narrative:   Josem Kaufmann approved: [id number]. Valid 2/5-2/7. Left message for [facility] admissions coordinator to notify.  Expected Discharge Plan: [place] Barriers to Discharge: Continued Medical Work up  Expected Discharge Plan and Services     Post Acute Care Choice: Resumption of Svcs/PTA Provider Living arrangements for the past 2 months: Single Family Home                                       Social Determinants of Health (SDOH) Interventions SDOH Screenings   Food Insecurity: No Food Insecurity (05/09/2022)  Housing: Low Risk  (05/09/2022)  Transportation Needs: No Transportation Needs (05/09/2022)  Utilities: Not At Risk (05/09/2022)  Tobacco Use: Low Risk  (05/23/2022)    Readmission Risk Interventions    05/23/2022   10:51 AM 05/07/2022    9:48 AM  Readmission Risk Prevention Plan  Transportation Screening Complete Complete  PCP or Specialist Appt within 3-5 Days Complete Complete  Social Work Consult for [place] Planning/Counseling Complete Complete  Palliative Care Screening Not Applicable Not Applicable  Medication Review Press photographer) Complete Complete

## 2022-05-26 NOTE — Plan of Care (Signed)
IV removed. 3 attempts made to call report for room 16B.  Case manager notified and it was stated that they would call me for report but did not communicate with anyone.  Patient refused tube feeding prior to discharge stating that she had already eaten downstairs during her swallow evaluation.  Did not want to agitate prior to EMS transport.

## 2022-05-26 NOTE — Assessment & Plan Note (Signed)
Replacing IV magnesium today and recheck magnesium along with BMP in 1 week.

## 2022-05-28 ENCOUNTER — Other Ambulatory Visit: Payer: Self-pay

## 2022-05-28 ENCOUNTER — Emergency Department
Admission: EM | Admit: 2022-05-28 | Discharge: 2022-05-28 | Disposition: A | Discharge status: Skilled Nursing Facility | Payer: Medicare Other | Attending: Emergency Medicine | Admitting: Emergency Medicine

## 2022-05-28 ENCOUNTER — Emergency Department: Payer: Medicare Other

## 2022-05-28 DIAGNOSIS — R1013 Epigastric pain: Secondary | ICD-10-CM | POA: Insufficient documentation

## 2022-05-28 DIAGNOSIS — R000 Tachycardia, unspecified: Secondary | ICD-10-CM | POA: Diagnosis not present

## 2022-05-28 DIAGNOSIS — R112 Nausea with vomiting, unspecified: Secondary | ICD-10-CM

## 2022-05-28 DIAGNOSIS — R1110 Vomiting, unspecified: Secondary | ICD-10-CM | POA: Diagnosis present

## 2022-05-28 DIAGNOSIS — R4182 Altered mental status, unspecified: Secondary | ICD-10-CM | POA: Insufficient documentation

## 2022-05-28 DIAGNOSIS — K920 Hematemesis: Secondary | ICD-10-CM | POA: Diagnosis not present

## 2022-05-28 DIAGNOSIS — N189 Chronic kidney disease, unspecified: Secondary | ICD-10-CM | POA: Diagnosis not present

## 2022-05-28 LAB — AMMONIA: Ammonia: <10 umol/L (ref 9–35)

## 2022-05-28 LAB — CBC WITH DIFFERENTIAL/PLATELET
Abs Immature Granulocytes: 0.04 10*3/uL (ref 0.00–0.07)
Basophils Absolute: 0.1 10*3/uL (ref 0.0–0.1)
Basophils Relative: 1 %
Eosinophils Absolute: 0.3 10*3/uL (ref 0.0–0.5)
Eosinophils Relative: 3 %
HCT: 31.3 % — ABNORMAL LOW (ref 36.0–46.0)
Hemoglobin: 9.8 g/dL — ABNORMAL LOW (ref 12.0–15.0)
Immature Granulocytes: 0 %
Lymphocytes Relative: 16 %
Lymphs Abs: 1.9 10*3/uL (ref 0.7–4.0)
MCH: 30.3 pg (ref 26.0–34.0)
MCHC: 31.3 g/dL (ref 30.0–36.0)
MCV: 96.9 fL (ref 80.0–100.0)
Monocytes Absolute: 0.9 10*3/uL (ref 0.1–1.0)
Monocytes Relative: 8 %
Neutro Abs: 8.4 10*3/uL — ABNORMAL HIGH (ref 1.7–7.7)
Neutrophils Relative %: 72 %
Platelets: 466 10*3/uL — ABNORMAL HIGH (ref 150–400)
RBC: 3.23 MIL/uL — ABNORMAL LOW (ref 3.87–5.11)
RDW: 13.4 % (ref 11.5–15.5)
WBC: 11.7 10*3/uL — ABNORMAL HIGH (ref 4.0–10.5)
nRBC: 0 % (ref 0.0–0.2)

## 2022-05-28 LAB — BLOOD GAS, VENOUS
Acid-Base Excess: 1 mmol/L (ref 0.0–2.0)
Bicarbonate: 26 mmol/L (ref 20.0–28.0)
O2 Saturation: 35.2 %
Patient temperature: 37
pCO2, Ven: 42 mmHg — ABNORMAL LOW (ref 44–60)
pH, Ven: 7.4 (ref 7.25–7.43)
pO2, Ven: <31 mmHg — CL (ref 32–45)

## 2022-05-28 LAB — COMPREHENSIVE METABOLIC PANEL
ALT: 12 U/L (ref 0–44)
AST: 25 U/L (ref 15–41)
Albumin: 3.2 g/dL — ABNORMAL LOW (ref 3.5–5.0)
Alkaline Phosphatase: 67 U/L (ref 38–126)
Anion gap: 11 (ref 5–15)
BUN: 44 mg/dL — ABNORMAL HIGH (ref 8–23)
CO2: 23 mmol/L (ref 22–32)
Calcium: 9.4 mg/dL (ref 8.9–10.3)
Chloride: 106 mmol/L (ref 98–111)
Creatinine, Ser: 2.24 mg/dL — ABNORMAL HIGH (ref 0.44–1.00)
GFR, Estimated: 23 mL/min — ABNORMAL LOW (ref >60)
Glucose, Bld: 341 mg/dL — ABNORMAL HIGH (ref 70–99)
Potassium: 4.8 mmol/L (ref 3.5–5.1)
Sodium: 140 mmol/L (ref 135–145)
Total Bilirubin: 0.6 mg/dL (ref 0.3–1.2)
Total Protein: 7.8 g/dL (ref 6.5–8.1)

## 2022-05-28 LAB — CBG MONITORING, ED
Glucose-Capillary: 301 mg/dL — ABNORMAL HIGH (ref 70–99)
Glucose-Capillary: 199 mg/dL — ABNORMAL HIGH (ref 70–99)

## 2022-05-28 LAB — TROPONIN I (HIGH SENSITIVITY)
Troponin I (High Sensitivity): 6 ng/L (ref <18)
Troponin I (High Sensitivity): 5 ng/L (ref <18)

## 2022-05-28 LAB — LACTIC ACID, PLASMA
Lactic Acid, Venous: 3.1 mmol/L (ref 0.5–1.9)
Lactic Acid, Venous: 1.2 mmol/L (ref 0.5–1.9)

## 2022-05-28 LAB — TYPE AND SCREEN
ABO/RH(D): A POS
Antibody Screen: NEGATIVE

## 2022-05-28 LAB — LIPASE, BLOOD: Lipase: 44 U/L (ref 11–51)

## 2022-05-28 LAB — ETHANOL: Alcohol, Ethyl (B): <10 mg/dL (ref <10)

## 2022-05-28 LAB — PROTIME-INR
INR: 1 (ref 0.8–1.2)
Prothrombin Time: 13.4 seconds (ref 11.4–15.2)

## 2022-05-28 LAB — BETA-HYDROXYBUTYRIC ACID: Beta-Hydroxybutyric Acid: 0.94 mmol/L — ABNORMAL HIGH (ref 0.05–0.27)

## 2022-05-28 MED ORDER — SODIUM CHLORIDE 0.9 % IV BOLUS
1000.0000 mL | Freq: Once | INTRAVENOUS | Status: AC
  Administered 2022-05-28: 1000 mL via INTRAVENOUS

## 2022-05-28 MED ORDER — ONDANSETRON HCL 4 MG/2ML IJ SOLN
4.0000 mg | Freq: Once | INTRAMUSCULAR | Status: AC
  Administered 2022-05-28: 4 mg via INTRAVENOUS
  Filled 2022-05-28: qty 2

## 2022-05-28 MED ORDER — PANTOPRAZOLE SODIUM 40 MG IV SOLR
40.0000 mg | Freq: Once | INTRAVENOUS | Status: AC
  Administered 2022-05-28: 40 mg via INTRAVENOUS
  Filled 2022-05-28: qty 10

## 2022-05-28 MED ORDER — SODIUM CHLORIDE 0.9 % IV SOLN
1.0000 g | Freq: Once | INTRAVENOUS | Status: AC
  Administered 2022-05-28: 1 g via INTRAVENOUS
  Filled 2022-05-28: qty 10

## 2022-05-28 NOTE — ED Notes (Signed)
Pt to CT

## 2022-05-28 NOTE — ED Notes (Signed)
Called [facility] for transport to [facility]  [phone]

## 2022-05-28 NOTE — ED Notes (Signed)
EDP notified of LA of 3.1

## 2022-05-28 NOTE — ED Notes (Addendum)
Pt states that her leg hurts, moving her left leg. Pt appears confused and now answering all questions appropriately. CBG 301 and EDP notified. Pt on pulse ox, cardiac and bp monitor.  Pt had emesis bag, with dark emesis in it from EMS

## 2022-05-28 NOTE — ED Notes (Signed)
Multiple attempts at IV and blood work. Unable to get blood work, lab called and stated they will send someone to attempt.

## 2022-05-28 NOTE — Inpatient Diabetes Management (Signed)
Inpatient Diabetes Program Recommendations  AACE/ADA: New Consensus Statement on Inpatient Glycemic Control   Target Ranges:  Prepandial:   less than 140 mg/dL      Peak postprandial:   less than 180 mg/dL (1-2 hours)      Critically ill patients:  140 - 180 mg/dL    Latest Reference Range & Units 05/28/22 09:57  Glucose-Capillary 70 - 99 mg/dL 301 (H)    Latest Reference Range & Units 05/28/22 10:13  CO2 22 - 32 mmol/L 23  Glucose 70 - 99 mg/dL 341 (H)  Anion gap 5 - 15  11   Review of Glycemic Control  Diabetes history: DM1 (does not make any insulin; requires basal, correction, and carbohydrate coverage insulin) Outpatient Diabetes medications: Basaglar 8 units QHS, Novolog 0-9 units Q4H, Novolog 3 units Q4H for tube feeding boluses Current orders for Inpatient glycemic control: None; in ED  Semglee 8 units QHS, Novolog 0-9 units, Novolog 0-5 units QHS, Novolog 4 units TID with meals; Osmolite 237 ml 6 times a day (6:00, 9:00, 12:00, 15:00, 18:00, 21:00)   Inpatient Diabetes Program Recommendations:     Insulin: If patient is admitted, she will need basal, correction, and carb coverage insulin since she makes NO insulin at all.  If admitted, please consider ordering Semglee 8 units QHS, CBGs Q4H, and Novolog 0-9 units Q4H. If tube feeding boluses are ordered 6 times a day, consider ordering Novolog 3 units 6 times a day timed with tube feeding boluses.   Note: Patient in ED from SNF with vomiting and hyperglycemia. Patient was just recently inpatient 05/21/22-2/5/2 with hematemesis and acute GI bleeding.  Patient has Type 1 DM and well known to inpatient diabetes team.  Per chart reivew, noted in Care Everywhere that patient sees Dr. Sheppard Evens [facility] Endocrinology) and was last seen on 11/25/21. Per office note on 11/25/21, patient is prescribed Basaglar 8-12 units daily, Novolog 1 unit per 15 grams of carbs, and 1 unit for every 40 mg/dl above 150 mg/dl.     Thanks, Barnie Alderman, RN, MSN,  [place] Diabetes Coordinator Inpatient Diabetes Program [phone] (Team Pager from 8am to [place])

## 2022-05-28 NOTE — ED Triage Notes (Signed)
Pt from [facility] care center due to vomiting and high blood sugar. EMS repots pt started to projectile vomit coffee ground emesis last night and her CBG was 404 at facility and insule was given. Pt has a history of opioid and alcohol abuse. EMS reports pt having some altered mental status.   EmS Vitals: 136/73 HR 114 99% RA 15-28 RR

## 2022-05-28 NOTE — ED Notes (Signed)
Phlebotomy at bedside to attempt to get blood work

## 2022-05-28 NOTE — Discharge Instructions (Signed)
Laurie Lin was evaluated in the emergency department for vomiting.  Her lab work showed a hemoglobin level that was higher than her normal.  Her glucose improved with IV fluids.  She was given nausea medication with Zofran.  Continue to follow-up with her primary care physician.  Continue to follow-up with her gastroenterologist.

## 2022-05-28 NOTE — ED Provider Notes (Signed)
[facility] Provider Note    Event Date/Time   First MD Initiated Contact with Patient 05/28/22 [phone]     (approximate)   History   No chief complaint on file.   HPI  Laurie Lin is a 67 y.o. female with past medical history significant for alcohol abuse, recurrent hematemesis with recent Mallory-Weiss tear, CKD, orthostatic hypotension on midodrine, Warnicke-Korsakoff syndrome, who presents to the emergency department for vomiting blood and altered mental status.  Patient arrives from nursing facility with vomiting and endorses coffee-ground emesis at home.  They endorse altered mental status.  Patient complaining of upper abdominal pain.  States that her last drink of alcohol was maybe a week ago, she does not recall.  She is uncertain if she has fallen.     Physical Exam   Triage Vital Signs: ED Triage Vitals [05/28/22 0932]  Enc Vitals Group     BP 111/79     Pulse Rate (!) 120     Resp 15     Temp 97.7 F (36.5 C)     Temp src      SpO2 100 %     Weight      Height      Head Circumference      Peak Flow      Pain Score      Pain Loc      Pain Edu?      Excl. in [place]?     Most recent vital signs: Vitals:   05/28/22 1300 05/28/22 1330  BP: 128/63 114/87  Pulse: (!) 107 (!) 105  Resp: 18 15  Temp:    SpO2: 97% 97%    Physical Exam Constitutional:      General: She is in acute distress.     Appearance: She is well-developed. She is ill-appearing.  HENT:     Head: Atraumatic.  Eyes:     Comments: Pale conjunctiva  Cardiovascular:     Rate and Rhythm: Regular rhythm. Tachycardia present.  Pulmonary:     Effort: No respiratory distress.  Abdominal:     General: There is no distension.     Tenderness: There is abdominal tenderness (Epigastric).  Musculoskeletal:        General: Normal range of motion.     Cervical back: Normal range of motion.  Skin:    General: Skin is warm.     Capillary Refill: Capillary refill takes 2  to 3 seconds.  Neurological:     Mental Status: She is alert.     Comments: Altered and confused, following simple commands, repetitively asking to move her legs up.     IMPRESSION / MDM / ASSESSMENT AND PLAN / ED COURSE  I reviewed the triage vital signs and the nursing notes.  On chart review of last records patient was discharged from the hospital to a nursing facility within the past week.  Patient had an endoscopy done on 2/1 and was found to have severe esophagitis and Mallory-Weiss tear with no signs of a variceal bleed.  Patient was diagnosed with Warnicke-Korsakoff syndrome and her symptoms improved on thiamine and Seroquel.  On review of her outside records patient is also on midodrine  Differential diagnosis including upper GI bleed concerning for variceal bleed, Mallory-Weiss tear, esophagitis/PUD, pancreatitis   EKG  I, Nathaniel Man, the attending physician, personally viewed and interpreted this ECG.   Rate: 118  Rhythm: Normal sinus  Axis: Normal  Intervals: Normal  ST&T Change:  None  Sinus tachycardia while on cardiac telemetry.  RADIOLOGY I independently reviewed imaging, my interpretation of imaging: CT scan of the head showed no signs of intracranial hemorrhage or infarction.  Read as no acute findings.  LABS (all labs ordered are listed, but only abnormal results are displayed) Labs interpreted as -    Labs Reviewed  COMPREHENSIVE METABOLIC PANEL - Abnormal; Notable for the following components:      Result Value   Glucose, Bld 341 (*)    BUN 44 (*)    Creatinine, Ser 2.24 (*)    Albumin 3.2 (*)    GFR, Estimated 23 (*)    All other components within normal limits  LACTIC ACID, PLASMA - Abnormal; Notable for the following components:   Lactic Acid, Venous 3.1 (*)    All other components within normal limits  CBC WITH DIFFERENTIAL/PLATELET - Abnormal; Notable for the following components:   WBC 11.7 (*)    RBC 3.23 (*)    Hemoglobin 9.8 (*)     HCT 31.3 (*)    Platelets 466 (*)    Neutro Abs 8.4 (*)    All other components within normal limits  BLOOD GAS, VENOUS - Abnormal; Notable for the following components:   pCO2, Ven 42 (*)    pO2, Ven <31 (*)    All other components within normal limits  BETA-HYDROXYBUTYRIC ACID - Abnormal; Notable for the following components:   Beta-Hydroxybutyric Acid 0.94 (*)    All other components within normal limits  CBG MONITORING, ED - Abnormal; Notable for the following components:   Glucose-Capillary 301 (*)    All other components within normal limits  CBG MONITORING, ED - Abnormal; Notable for the following components:   Glucose-Capillary 199 (*)    All other components within normal limits  CULTURE, BLOOD (ROUTINE X 2)  CULTURE, BLOOD (ROUTINE X 2)  ETHANOL  LACTIC ACID, PLASMA  LIPASE, BLOOD  PROTIME-INR  AMMONIA  TYPE AND SCREEN  TROPONIN I (HIGH SENSITIVITY)  TROPONIN I (HIGH SENSITIVITY)    TREATMENT  1 L of IV fluids, IV Rocephin to cover for SBP given her GI bleed in the setting of alcohol abuse, IV Protonix, IV Zofran  MDM  Difficult IV stick, multiple attempts, do not want to delay in antibiotics given the patient 1 dose of IV Rocephin.  On reevaluation no longer with nausea or vomiting.  Did have 1 episode of vomiting in the emergency department and did not appear bloody.  No coffee-ground emesis.  Hemoglobin is improved when compared to her prior.  Did have an elevated lactic acid however downtrending after IV fluids.  Hyperglycemia that resolved with IV fluids.  No worsening altered mental status from time of discharge and does have a diagnosis of Warnicke's encephalopathy.  Husband is at bedside which he states this was the same state when she was discharged from the hospital.  He was concerned about her coming up to the emergency department and felt that she could be treated with antiemetics in the nursing facility   On reevaluation patient states she feels better.   No episodes of vomiting.  Mild tachycardia however significantly improved and on chart review has a history of tachycardia.  Once discharged from the hospital with mild tachycardia.  Had a recent endoscopy that shows no signs of variceal bleeds he do not feel that r repeat EGD is necessary at this time.  Discussed ongoing treatment with her PPI, alcohol cessation and follow-up with gastroenterologist.  Given return precautions for any recurrent episodes.   PROCEDURES:  Critical Care performed: No  Procedures  Patient's presentation is most consistent with acute presentation with potential threat to life or bodily function.   MEDICATIONS ORDERED IN ED: Medications  sodium chloride 0.9 % bolus 1,000 mL (0 mLs Intravenous Stopped 05/28/22 1151)  ondansetron (ZOFRAN) injection 4 mg (4 mg Intravenous Given 05/28/22 0952)  pantoprazole (PROTONIX) injection 40 mg (40 mg Intravenous Given 05/28/22 0954)  cefTRIAXone (ROCEPHIN) 1 g in sodium chloride 0.9 % 100 mL IVPB (0 g Intravenous Stopped 05/28/22 1120)  ondansetron (ZOFRAN) injection 4 mg (4 mg Intravenous Given 05/28/22 1125)  sodium chloride 0.9 % bolus 1,000 mL (0 mLs Intravenous Stopped 05/28/22 1419)    FINAL CLINICAL IMPRESSION(S) / ED DIAGNOSES   Final diagnoses:  Hematemesis with nausea  Nausea and vomiting, unspecified vomiting type     Rx / DC Orders   ED Discharge Orders     None        Note:  This document was prepared using Dragon voice recognition software and may include unintentional dictation errors.   Nathaniel Man, MD 05/28/22 1438

## 2022-06-02 LAB — CULTURE, BLOOD (ROUTINE X 2)
Culture: NO GROWTH
Culture: NO GROWTH
Special Requests: ADEQUATE

## 2022-06-04 ENCOUNTER — Emergency Department
Admission: EM | Admit: 2022-06-04 | Discharge: 2022-06-04 | Disposition: A | Discharge status: Home / Self Care | Payer: Medicare Other | Attending: Emergency Medicine | Admitting: Emergency Medicine

## 2022-06-04 ENCOUNTER — Emergency Department: Payer: Medicare Other | Admitting: Radiology

## 2022-06-04 DIAGNOSIS — T85528A Displacement of other gastrointestinal prosthetic devices, implants and grafts, initial encounter: Secondary | ICD-10-CM

## 2022-06-04 DIAGNOSIS — K9423 Gastrostomy malfunction: Secondary | ICD-10-CM | POA: Insufficient documentation

## 2022-06-04 HISTORY — PX: IR REPLC GASTRO/COLONIC TUBE PERCUT W/FLUORO: IMG2333

## 2022-06-04 LAB — CBC WITH DIFFERENTIAL/PLATELET
Abs Immature Granulocytes: 0.05 10*3/uL (ref 0.00–0.07)
Basophils Absolute: 0.1 10*3/uL (ref 0.0–0.1)
Basophils Relative: 1 %
Eosinophils Absolute: 0.5 10*3/uL (ref 0.0–0.5)
Eosinophils Relative: 5 %
HCT: 25.8 % — ABNORMAL LOW (ref 36.0–46.0)
Hemoglobin: 8.3 g/dL — ABNORMAL LOW (ref 12.0–15.0)
Immature Granulocytes: 1 %
Lymphocytes Relative: 23 %
Lymphs Abs: 2.3 10*3/uL (ref 0.7–4.0)
MCH: 30.2 pg (ref 26.0–34.0)
MCHC: 32.2 g/dL (ref 30.0–36.0)
MCV: 93.8 fL (ref 80.0–100.0)
Monocytes Absolute: 1.4 10*3/uL — ABNORMAL HIGH (ref 0.1–1.0)
Monocytes Relative: 13 %
Neutro Abs: 6 10*3/uL (ref 1.7–7.7)
Neutrophils Relative %: 57 %
Platelets: 538 10*3/uL — ABNORMAL HIGH (ref 150–400)
RBC: 2.75 MIL/uL — ABNORMAL LOW (ref 3.87–5.11)
RDW: 13.7 % (ref 11.5–15.5)
WBC: 10.3 10*3/uL (ref 4.0–10.5)
nRBC: 0 % (ref 0.0–0.2)

## 2022-06-04 LAB — BASIC METABOLIC PANEL
Anion gap: 10 (ref 5–15)
BUN: 28 mg/dL — ABNORMAL HIGH (ref 8–23)
CO2: 23 mmol/L (ref 22–32)
Calcium: 9 mg/dL (ref 8.9–10.3)
Chloride: 99 mmol/L (ref 98–111)
Creatinine, Ser: 1.72 mg/dL — ABNORMAL HIGH (ref 0.44–1.00)
GFR, Estimated: 32 mL/min — ABNORMAL LOW (ref >60)
Glucose, Bld: 255 mg/dL — ABNORMAL HIGH (ref 70–99)
Potassium: 3.9 mmol/L (ref 3.5–5.1)
Sodium: 132 mmol/L — ABNORMAL LOW (ref 135–145)

## 2022-06-04 MED ORDER — LIDOCAINE HCL 1 % IJ SOLN
INTRAMUSCULAR | Status: AC
  Administered 2022-06-04: 10 mL
  Filled 2022-06-04: qty 20

## 2022-06-04 MED ORDER — IOHEXOL 300 MG/ML  SOLN
8.0000 mL | Freq: Once | INTRAMUSCULAR | Status: AC | PRN
  Administered 2022-06-04: 8 mL

## 2022-06-04 MED ORDER — STERILE WATER FOR INJECTION IJ SOLN
INTRAMUSCULAR | Status: AC
  Filled 2022-06-04: qty 10

## 2022-06-04 MED ORDER — STERILE WATER FOR INJECTION IJ SOLN
INTRAMUSCULAR | Status: AC
  Filled 2022-06-04: qty 20

## 2022-06-04 MED ORDER — LIDOCAINE VISCOUS HCL 2 % MT SOLN
OROMUCOSAL | Status: AC
  Filled 2022-06-04: qty 15

## 2022-06-04 MED ORDER — ACETAMINOPHEN 160 MG/5ML PO SOLN
1000.0000 mg | Freq: Once | ORAL | Status: DC
  Filled 2022-06-04: qty 40.6

## 2022-06-04 NOTE — ED Triage Notes (Signed)
Pt arrives via EMS from [facility] after feeding tube came out. EMS also reports an abnormal lab but unsure. Pt confused at baseline. Denies any pain.

## 2022-06-04 NOTE — Progress Notes (Signed)
Consent to replace gastrostomy tube obtained from spouse, Lauretta Brittan, currently at bedside.  Consent in IR control room.   Narda Rutherford, AGNP-BC 06/04/2022, 1:06 PM

## 2022-06-04 NOTE — ED Notes (Signed)
[facility] called for transport to [facility]

## 2022-06-04 NOTE — ED Notes (Signed)
First nurse note: From [facility] Feeding tube dislodged since 0900 today Pt confused, disoriented (this is her baseline) All VSS. 128/66, 103 pulse

## 2022-06-04 NOTE — ED Provider Notes (Signed)
[facility] Provider Note  Patient Contact: 11:12 AM (approximate)   History   feeding tube out and Abnormal Lab   HPI  Laurie Lin is a 67 y.o. female who presents the emergency department for reported G-tube issues/possible displacement.  Patient is known to this hospital having been here several times over the last month with problems with altered mental status, GI bleeding.  It is known that patient has a Mallory-Weiss tear, has had some slow bleeding but no evidence of variceal bleeding.  Patient is G-tube dependent having had a G-tube placed roughly a month ago.  Patient is very confused which is her baseline.  She is noncontributory towards her history.  EMS provides majority of patient's history.  Reportedly facility staff found the G-tube out at 9:00 this morning.  Patient was sent to the emergency department to see if he could replace.  Patient denies any complaints, and patient is confused to the point of saying that she does not need the G-tube.  However on review of patient's recent encounters, medical record it appears that patient is G-tube dependent.  Facility was contacted and reportedly there is no other concerns/complaints from the facility's point of view other than G-tube displacement     Physical Exam   Triage Vital Signs: ED Triage Vitals  Enc Vitals Group     BP 06/04/22 1023 115/70     Pulse Rate 06/04/22 1023 (!) 106     Resp 06/04/22 1023 16     Temp 06/04/22 1023 99.2 F (37.3 C)     Temp Source 06/04/22 1023 Oral     SpO2 06/04/22 1023 97 %     Weight 06/04/22 1024 180 lb 12.4 oz (82 kg)     Height 06/04/22 1024 5' 10"$  (1.778 m)     Head Circumference --      Peak Flow --      Pain Score 06/04/22 1024 0     Pain Loc --      Pain Edu? --      Excl. in [place]? --     Most recent vital signs: Vitals:   06/04/22 1023  BP: 115/70  Pulse: (!) 106  Resp: 16  Temp: 99.2 F (37.3 C)  SpO2: 97%     General: Alert and in no  acute distress.  Cardiovascular:  Good peripheral perfusion Respiratory: Normal respiratory effort without tachypnea or retractions. Lungs CTAB Gastrointestinal: Visualization of the abdominal wall reveals site of previous G-tube.  G-tube is completely removed at this time.  Looking at this site I suspect that G-tube has been out of place longer than the last hour.  Edges of the tissue had started to dry and adhere.  Bowel sounds 4 quadrants. Soft and nontender to palpation. No guarding or rigidity. No palpable masses. No distention. No CVA tenderness. Musculoskeletal: Full range of motion to all extremities.  Neurologic:  No gross focal neurologic deficits are appreciated.  Skin:   No rash noted Other:   ED Results / Procedures / Treatments   Labs (all labs ordered are listed, but only abnormal results are displayed) Labs Reviewed  CBC WITH DIFFERENTIAL/PLATELET - Abnormal; Notable for the following components:      Result Value   RBC 2.75 (*)    Hemoglobin 8.3 (*)    HCT 25.8 (*)    Platelets 538 (*)    Monocytes Absolute 1.4 (*)    All other components within normal limits  BASIC METABOLIC  PANEL - Abnormal; Notable for the following components:   Sodium 132 (*)    Glucose, Bld 255 (*)    BUN 28 (*)    Creatinine, Ser 1.72 (*)    GFR, Estimated 32 (*)    All other components within normal limits     EKG     RADIOLOGY    No results found.  PROCEDURES:  Critical Care performed: No  Gastrostomy tube replacement  Date/Time: 06/04/2022 11:38 AM  Performed by: Darletta Moll, PA-C Authorized by: Darletta Moll, PA-C  Consent: Verbal consent obtained. Risks and benefits: risks, benefits and alternatives were discussed Consent given by: patient (Patient is confused at baseline, but did give verbal consent to replace G-tube) Required items: required blood products, implants, devices, and special equipment available Patient identity confirmed: arm  band Preparation: Patient was prepped and draped in the usual sterile fashion. Local anesthesia used: no  Anesthesia: Local anesthesia used: no  Sedation: Patient sedated: no  Comments: Patient reportedly had an 58 [place] G-tube at her long-term care facility.  Edges of tissue had started to readhere prior to attempt at replacement.  I used a 33 [place] G-tube, however it appears that the tract has already started to heal and unable to replace G-tube at this time.      MEDICATIONS ORDERED IN ED: Medications  sterile water (preservative free) injection (has no administration in time range)  acetaminophen (TYLENOL) 160 MG/5ML solution 1,000 mg (has no administration in time range)  lidocaine (XYLOCAINE) 2 % viscous mouth solution (  Given 06/04/22 1351)  lidocaine (XYLOCAINE) 1 % (with pres) injection (10 mLs  Given 06/04/22 1352)  iohexol (OMNIPAQUE) 300 MG/ML solution 8 mL (8 mLs Per Tube Contrast Given 06/04/22 1355)     IMPRESSION / MDM / ASSESSMENT AND PLAN / ED COURSE  I reviewed the triage vital signs and the nursing notes.                                 Differential diagnosis includes, but is not limited to, G-tube complication, G-tube removal, G-tube replacement   Patient's presentation is most consistent with acute presentation with potential threat to life or bodily function.   Patient's diagnosis is consistent with gastrostomy tube displacement with replacement.  Patient presents to the emergency department from long-term care facility.  Patient has Warnicke Korsakoff and has chronic slight altered mental status.  Patient is a very poor historian and cannot provide details.  According to EMS and the facility patient was found this morning roughly 9:00 without her G-tube.  Unsure the exact time out however given visualization I would suspect that this had been out overnight.  Edges of skin had started to adhere, and my attempt to replace G-tube revealed no indwelling tract  that I could find to replace G-tube.  As such consulted IR who states that they can replace tube for the patient today.Marland Kitchen   ----------------------------------------- 2:35 PM on 06/04/2022 -----------------------------------------   IR has successfully replaced patient's G-tube.  Patient is stable for discharge at this time.   Patient is given ED precautions to return to the ED for any worsening or new symptoms.     FINAL CLINICAL IMPRESSION(S) / ED DIAGNOSES   Final diagnoses:  Dislodged gastrostomy tube     Rx / DC Orders   ED Discharge Orders     None        Note:  This document was prepared using Dragon voice recognition software and may include unintentional dictation errors.   Brynda Peon 06/04/22 1435    Arta Silence, MD 06/04/22 [phone]

## 2022-06-04 NOTE — ED Notes (Signed)
Here for g-tube replacement, pt is a poor historian and states that it was only out 30 min, pt denies pain, pt is unable to state why the feeding tube was placed and pt also states that it isn't used at all

## 2022-06-09 ENCOUNTER — Emergency Department: Payer: Medicare Other

## 2022-06-09 ENCOUNTER — Emergency Department
Admission: EM | Admit: 2022-06-09 | Discharge: 2022-06-09 | Disposition: A | Discharge status: Home / Self Care | Payer: Medicare Other | Attending: Student in an Organized Health Care Education/Training Program | Admitting: Student in an Organized Health Care Education/Training Program

## 2022-06-09 ENCOUNTER — Emergency Department
Admission: EM | Admit: 2022-06-09 | Discharge: 2022-06-09 | Disposition: A | Discharge status: Home / Self Care | Payer: Medicare Other | Source: Home / Self Care | Attending: Emergency Medicine | Admitting: Emergency Medicine

## 2022-06-09 ENCOUNTER — Other Ambulatory Visit: Payer: Self-pay

## 2022-06-09 DIAGNOSIS — E1165 Type 2 diabetes mellitus with hyperglycemia: Secondary | ICD-10-CM | POA: Insufficient documentation

## 2022-06-09 DIAGNOSIS — S0990XA Unspecified injury of head, initial encounter: Secondary | ICD-10-CM

## 2022-06-09 DIAGNOSIS — R739 Hyperglycemia, unspecified: Secondary | ICD-10-CM

## 2022-06-09 DIAGNOSIS — R451 Restlessness and agitation: Secondary | ICD-10-CM | POA: Insufficient documentation

## 2022-06-09 DIAGNOSIS — F0390 Unspecified dementia without behavioral disturbance: Secondary | ICD-10-CM | POA: Diagnosis not present

## 2022-06-09 DIAGNOSIS — W19XXXA Unspecified fall, initial encounter: Secondary | ICD-10-CM | POA: Diagnosis not present

## 2022-06-09 DIAGNOSIS — S0101XA Laceration without foreign body of scalp, initial encounter: Secondary | ICD-10-CM | POA: Diagnosis present

## 2022-06-09 LAB — BASIC METABOLIC PANEL
Anion gap: 11 (ref 5–15)
BUN: 28 mg/dL — ABNORMAL HIGH (ref 8–23)
CO2: 22 mmol/L (ref 22–32)
Calcium: 9.1 mg/dL (ref 8.9–10.3)
Chloride: 95 mmol/L — ABNORMAL LOW (ref 98–111)
Creatinine, Ser: 1.88 mg/dL — ABNORMAL HIGH (ref 0.44–1.00)
GFR, Estimated: 29 mL/min — ABNORMAL LOW (ref >60)
Glucose, Bld: 414 mg/dL — ABNORMAL HIGH (ref 70–99)
Potassium: 4 mmol/L (ref 3.5–5.1)
Sodium: 128 mmol/L — ABNORMAL LOW (ref 135–145)

## 2022-06-09 LAB — CBC
HCT: 24.5 % — ABNORMAL LOW (ref 36.0–46.0)
Hemoglobin: 7.8 g/dL — ABNORMAL LOW (ref 12.0–15.0)
MCH: 30.1 pg (ref 26.0–34.0)
MCHC: 31.8 g/dL (ref 30.0–36.0)
MCV: 94.6 fL (ref 80.0–100.0)
Platelets: 561 10*3/uL — ABNORMAL HIGH (ref 150–400)
RBC: 2.59 MIL/uL — ABNORMAL LOW (ref 3.87–5.11)
RDW: 13.3 % (ref 11.5–15.5)
WBC: 9.5 10*3/uL (ref 4.0–10.5)
nRBC: 0 % (ref 0.0–0.2)

## 2022-06-09 LAB — CBG MONITORING, ED
Glucose-Capillary: 482 mg/dL — ABNORMAL HIGH (ref 70–99)
Glucose-Capillary: 284 mg/dL — ABNORMAL HIGH (ref 70–99)

## 2022-06-09 MED ORDER — SODIUM CHLORIDE 0.9 % IV BOLUS
1000.0000 mL | Freq: Once | INTRAVENOUS | Status: AC
  Administered 2022-06-09: 1000 mL via INTRAVENOUS

## 2022-06-09 MED ORDER — INSULIN ASPART 100 UNIT/ML IJ SOLN
8.0000 [IU] | Freq: Once | INTRAMUSCULAR | Status: DC
  Filled 2022-06-09: qty 1

## 2022-06-09 NOTE — ED Provider Notes (Signed)
[facility] Provider Note    Event Date/Time   First MD Initiated Contact with Patient 06/09/22 2042     (approximate)   History   Shortness of Breath   HPI  Laurie Lin is a 67 y.o. female past medical history significant for diabetes, Warnicke's Korsakoff syndrome, who presents to the emergency department following an episode of hypoxia.  Patient was just discharged from the emergency department after having a visit where she had a fall with a head injury.  Patient was seen in the ER and cleared and discharged back to the SNF.  Nursing staff at the SNF stated that patient became agitated and her pulse ox was reading in the 70s and her heart rate was 130s.  States that her instructions were to give IM Ativan and if that did not improve her agitation and her symptoms to come over to the emergency department.  States that they did not have any Ativan at the facility so that she just came to the emergency department.  When EMS arrived her oxygen saturations were 100%.  EMS stated that they told this to the nursing facility and they stated that she needed to come over anyways.  100% on room air.  Patient without any complaints.  Denies any chest pain or shortness of breath.  They also stated that her glucose level was in the 500s and they gave her her corrective insulin.     Physical Exam   Triage Vital Signs: ED Triage Vitals  Enc Vitals Group     BP      Pulse      Resp      Temp      Temp src      SpO2      Weight      Height      Head Circumference      Peak Flow      Pain Score      Pain Loc      Pain Edu?      Excl. in [place]?     Most recent vital signs: Vitals:   06/09/22 2048  BP: 112/63  Pulse: (!) 102  Temp: 98.6 F (37 C)  SpO2: 100%    Physical Exam Constitutional:      Appearance: She is well-developed.  HENT:     Head: Atraumatic.  Eyes:     Conjunctiva/sclera: Conjunctivae normal.  Cardiovascular:     Rate and Rhythm:  Regular rhythm.  Pulmonary:     Effort: Pulmonary effort is normal. No tachypnea or respiratory distress.  Abdominal:     General: There is no distension.  Musculoskeletal:        General: Normal range of motion.     Cervical back: Normal range of motion.  Skin:    General: Skin is warm.  Neurological:     Mental Status: She is alert. Mental status is at baseline.     Comments: At baseline, following simple commands.     IMPRESSION / MDM / ASSESSMENT AND PLAN / ED COURSE  I reviewed the triage vital signs and the nursing notes.  Clinical picture is most concerning for agitated event that was causing the medical equipment to not pick up her correct oxygen saturation or pulse ox.  In the emergency department her pulse ox has been 100% with no labored breathing.  Patient appears to be at her baseline.  Differential including dysrhythmia, ACS, hyperglycemia.  EKG  I, Larene Beach  Kyle Luppino, the attending physician, personally viewed and interpreted this ECG.   Rate: Normal  Rhythm: Normal sinus  Axis: Normal  Intervals: Normal  ST&T Change: None  No tachycardic or bradycardic dysrhythmias while on cardiac telemetry.   LABS (all labs ordered are listed, but only abnormal results are displayed) Labs interpreted as -   Hyperglycemia and pseudohyponatremia.  No signs of DKA.  Anemia which is consistent with her chronic anemia.  No signs or symptoms consistent with a GI bleed.  Labs Reviewed  CBC - Abnormal; Notable for the following components:      Result Value   RBC 2.59 (*)    Hemoglobin 7.8 (*)    HCT 24.5 (*)    Platelets 561 (*)    All other components within normal limits  BASIC METABOLIC PANEL - Abnormal; Notable for the following components:   Sodium 128 (*)    Chloride 95 (*)    Glucose, Bld 414 (*)    BUN 28 (*)    Creatinine, Ser 1.88 (*)    GFR, Estimated 29 (*)    All other components within normal limits  CBG MONITORING, ED - Abnormal; Notable for the following  components:   Glucose-Capillary 482 (*)    All other components within normal limits  CBG MONITORING, ED - Abnormal; Notable for the following components:   Glucose-Capillary 284 (*)    All other components within normal limits    TREATMENT    MDM  EKG without acute findings.  Patient had lab work done just couple of hours ago that showed a glucose level in the 200s with no significant electrolyte abnormalities.  Do not feel that repeat lab work is necessary at this time.  Given that the patient is 100% on room air with no chest pain or shortness of breath do not feel that a troponin is necessary at this time if she has normal EKG.  Hyperglycemia.  No signs of DKA.  Patient was given 1 L of IV fluids and repeat glucose was in the 200s.  Patient is at her baseline and has a mental status that is consistent with her Warnicke's Korsakoff syndrome.  Safe to be discharged back to nursing facility.Marland Kitchen     PROCEDURES:  Critical Care performed: No  Procedures  Patient's presentation is most consistent with acute illness / injury with system symptoms.   MEDICATIONS ORDERED IN ED: Medications  sodium chloride 0.9 % bolus 1,000 mL (1,000 mLs Intravenous New Bag/Given 06/09/22 2131)    FINAL CLINICAL IMPRESSION(S) / ED DIAGNOSES   Final diagnoses:  Agitated  Hyperglycemia     Rx / DC Orders   ED Discharge Orders     None        Note:  This document was prepared using Dragon voice recognition software and may include unintentional dictation errors.   Nathaniel Man, MD 06/09/22 2223

## 2022-06-09 NOTE — ED Provider Notes (Signed)
[facility] Provider Note  Patient Contact: 5:04 PM (approximate)   History   Fall   HPI  Laurie Lin is a 67 y.o. female who presents the emergency department complaining of a fall.  Patient is very confused, has a history of Warnicke Korsakoff.  Patient is known to myself having seen her within the last week for accidentally pulling out her G-tube.  At this time, patient is largely noncontributory towards her history.  According to EMS and nursing staff who took report, patient did fall, hit her head had a small laceration to the left occipital skull region.  There was no reported loss of consciousness or subsequent loss of consciousness.  Patient has been at her baseline.     Physical Exam   Triage Vital Signs: ED Triage Vitals  Enc Vitals Group     BP 06/09/22 1412 (!) 146/65     Pulse Rate 06/09/22 1412 94     Resp 06/09/22 1412 20     Temp 06/09/22 1412 98.8 F (37.1 C)     Temp Source 06/09/22 1412 Oral     SpO2 06/09/22 1412 100 %     Weight 06/09/22 1425 180 lb 12.4 oz (82 kg)     Height 06/09/22 1425 5' 10"$  (D34-534 m)     Head Circumference --      Peak Flow --      Pain Score 06/09/22 1412 0     Pain Loc --      Pain Edu? --      Excl. in [place]? --     Most recent vital signs: Vitals:   06/09/22 1412  BP: (!) 146/65  Pulse: 94  Resp: 20  Temp: 98.8 F (37.1 C)  SpO2: 100%     General: Alert and in no acute distress. Eyes:  PERRL. EOMI. Head: Superficial less than 1 cm laceration noted to the left occipital scalp.  No active bleeding.  No retained foreign body identified.  Patient has no battle signs, raccoon eyes or serosanguineous fluid drainage from the ears or nares.  Neck: No stridor. No cervical spine tenderness to palpation.  Cardiovascular:  Good peripheral perfusion Respiratory: Normal respiratory effort without tachypnea or retractions. Lungs CTAB. Musculoskeletal: Full range of motion to all extremities.   Neurologic:  No gross focal neurologic deficits are appreciated.  Skin:   No rash noted Other:   ED Results / Procedures / Treatments   Labs (all labs ordered are listed, but only abnormal results are displayed) Labs Reviewed - No data to display   EKG     RADIOLOGY  I personally viewed, evaluated, and interpreted these images as part of my medical decision making, as well as reviewing the written report by the radiologist.  ED Provider Interpretation: No acute intracranial abnormality.  No evidence of skull fracture.  CT Head Wo Contrast  Result Date: 06/09/2022 CLINICAL DATA:  Trauma EXAM: CT HEAD WITHOUT CONTRAST CT CERVICAL SPINE WITHOUT CONTRAST TECHNIQUE: Multidetector CT imaging of the head and cervical spine was performed following the standard protocol without intravenous contrast. Multiplanar CT image reconstructions of the cervical spine were also generated. RADIATION DOSE REDUCTION: This exam was performed according to the departmental dose-optimization program which includes automated exposure control, adjustment of the mA and/or kV according to patient size and/or use of iterative reconstruction technique. COMPARISON:  CT Head 04/22/22, CT C Spine 04/18/22 FINDINGS: CT HEAD FINDINGS Brain: No hemorrhage. No extra-axial fluid collection. No CT evidence  of an acute infarct. Unchanged size and shape of the ventricular system with an acute callosal angle. Vascular: No hyperdense vessel or unexpected calcification. Skull: Normal. Negative for fracture or focal lesion. Sinuses/Orbits: No middle ear or mastoid effusion. Paranasal sinuses bilateral lens replacement. Orbits are otherwise unremarkable. Other: None. CT CERVICAL SPINE FINDINGS Limitations: Assessment of the cervical spine is limited due to the degree of motion artifact. Alignment: Straightening of the normal cervical lordosis. Skull base and vertebrae: No acute fracture. No primary bone lesion or focal pathologic process.  Soft tissues and spinal canal: No prevertebral fluid or swelling. No visible canal hematoma. Disc levels:  No evidence of high-grade spinal canal stenosis. Upper chest: Negative. Other: None IMPRESSION: 1. No acute intracranial abnormality. 2. Unchanged size and shape of the ventricular system with an acute callosal angle, which can be seen in the setting of normal pressure hydrocephalus. 3. Assessment of the cervical spine is limited due to the degree of motion artifact. Within this limitation, no acute fracture or traumatic listhesis of the cervical spine. Electronically Signed   By: Marin Roberts M.D.   On: 06/09/2022 14:59   CT Cervical Spine Wo Contrast  Result Date: 06/09/2022 CLINICAL DATA:  Trauma EXAM: CT HEAD WITHOUT CONTRAST CT CERVICAL SPINE WITHOUT CONTRAST TECHNIQUE: Multidetector CT imaging of the head and cervical spine was performed following the standard protocol without intravenous contrast. Multiplanar CT image reconstructions of the cervical spine were also generated. RADIATION DOSE REDUCTION: This exam was performed according to the departmental dose-optimization program which includes automated exposure control, adjustment of the mA and/or kV according to patient size and/or use of iterative reconstruction technique. COMPARISON:  CT Head 04/22/22, CT C Spine 04/18/22 FINDINGS: CT HEAD FINDINGS Brain: No hemorrhage. No extra-axial fluid collection. No CT evidence of an acute infarct. Unchanged size and shape of the ventricular system with an acute callosal angle. Vascular: No hyperdense vessel or unexpected calcification. Skull: Normal. Negative for fracture or focal lesion. Sinuses/Orbits: No middle ear or mastoid effusion. Paranasal sinuses bilateral lens replacement. Orbits are otherwise unremarkable. Other: None. CT CERVICAL SPINE FINDINGS Limitations: Assessment of the cervical spine is limited due to the degree of motion artifact. Alignment: Straightening of the normal cervical  lordosis. Skull base and vertebrae: No acute fracture. No primary bone lesion or focal pathologic process. Soft tissues and spinal canal: No prevertebral fluid or swelling. No visible canal hematoma. Disc levels:  No evidence of high-grade spinal canal stenosis. Upper chest: Negative. Other: None IMPRESSION: 1. No acute intracranial abnormality. 2. Unchanged size and shape of the ventricular system with an acute callosal angle, which can be seen in the setting of normal pressure hydrocephalus. 3. Assessment of the cervical spine is limited due to the degree of motion artifact. Within this limitation, no acute fracture or traumatic listhesis of the cervical spine. Electronically Signed   By: Marin Roberts M.D.   On: 06/09/2022 14:59    PROCEDURES:  Critical Care performed: No  Procedures   MEDICATIONS ORDERED IN ED: Medications - No data to display   IMPRESSION / MDM / [place] / ED COURSE  I reviewed the triage vital signs and the nursing notes.                                 Differential diagnosis includes, but is not limited to, fall, head injury, scalp laceration, skull fracture, intracranial hemorrhage, concussion  Patient's presentation is  most consistent with acute presentation with potential threat to life or bodily function.   Patient's diagnosis is consistent with fall, minor head injury, scalp laceration.  Patient presents emergency department after a fall and hitting her head.  No reported loss consciousness.  Patient is confused at baseline, appears that there is no change from her baseline confusion.  Patient is a very superficial and small laceration to the left occipital skull that reveals no evidence of retained foreign body.  No underlying skull fracture.  No intracranial hemorrhage.  Laceration does not require closure at this time.  Wound care instructions to be provided on paperwork for patient's facility's staff.  Patient is stable for discharge at this time..   Patient is given ED precautions to return to the ED for any worsening or new symptoms.     FINAL CLINICAL IMPRESSION(S) / ED DIAGNOSES   Final diagnoses:  Fall, initial encounter  Minor head injury, initial encounter  Laceration of scalp, initial encounter     Rx / DC Orders   ED Discharge Orders     None        Note:  This document was prepared using Dragon voice recognition software and may include unintentional dictation errors.   Darletta Moll, PA-C 06/09/22 1709    Arta Silence, MD 06/10/22 [phone]

## 2022-06-09 NOTE — Discharge Instructions (Addendum)
Laurie Lin was evaluated in the emergency department following an agitated episode in the nursing facility.  Her oxygen levels were 100% in the emergency department.  She had no tachycardia and she had normal vital signs.  Her glucose levels were not concerning.  Continue to use her corrective insulin for her hyperglycemia.  Follow-up closely with her primary care physician. Glu 200s

## 2022-06-09 NOTE — ED Triage Notes (Signed)
Pt from [facility] via [place] with c/o SOB and agitation per SNF staff. Pt was d/c x1 hr d/t a fall from yesterday.

## 2022-06-09 NOTE — ED Triage Notes (Signed)
Patient arrived by EMS from [facility] with c/o unwitnessed fall in shower this AM. Small lac to back of left head. Bleeding controlled.  Hx dementia  Staff sent patient here for CT scan of head  149/72 b/p 100% RA 92HR

## 2022-06-09 NOTE — ED Notes (Signed)
Pt very argumentative and trying to leave room. Pt difficult to redirect but this tech was eventually able to get pt back in bed after telling her repeatedly that she is supposed to wait in her room for her ride. Pt still arguing but doing so from bed in comfortable sitting position. This tech at bedside as sitter.

## 2022-06-09 NOTE — ED Triage Notes (Signed)
REPORT PROVIDED BY [facility] STAFF: PT RETURNED TO FACILITY TONIGHT AT 1900. SINCE RETURN, PT AGITATED, NON-COMPLIANT AND FOUND TO HAVE OXYGEN SATS IN 70'S ON ROOM AIR. STAFF APPLIED OXYGEN TO PT BUT REPORTS PT WOULD NOT KEEP NASAL CANULA ON. STAFF ALSO REPORTS PTS BLOOD GLUCOSE ON RETURN WAS 510. STAFF GAVE INSULIN PRIOR TO PT LEAVING FACILITY TO COME TO ED.

## 2022-06-26 ENCOUNTER — Telehealth: Payer: Self-pay | Admitting: Nurse Practitioner

## 2022-06-26 NOTE — Telephone Encounter (Signed)
Pt spouse called in staying that pt needs some approval from provider, like physical therapy exercise, pt also has a feeding tube, etc. He's aware that her appt its not until 3/22, but he was wondering if anyone can help his wife. He's available [phone].

## 2022-06-27 NOTE — Telephone Encounter (Signed)
Noted  

## 2022-06-27 NOTE — Telephone Encounter (Signed)
Called and spoke with DPR patient was released from [facility] and appt was made for patient to get help with skill.ursing , and OT, PT.Marland Kitchen Patient husband declined earlier appt due to he cannot get patient to office by  8:20 so patient keep appt scheduled for 07/11/22 with NP

## 2022-07-11 ENCOUNTER — Ambulatory Visit (INDEPENDENT_AMBULATORY_CARE_PROVIDER_SITE_OTHER): Payer: Medicare Other | Admitting: Nurse Practitioner

## 2022-07-11 ENCOUNTER — Encounter: Payer: Self-pay | Admitting: Nurse Practitioner

## 2022-07-11 VITALS — BP 128/68 | HR 87 | Temp 97.6°F | Ht 70.0 in | Wt 179.0 lb

## 2022-07-11 DIAGNOSIS — E1065 Type 1 diabetes mellitus with hyperglycemia: Secondary | ICD-10-CM

## 2022-07-11 DIAGNOSIS — R600 Localized edema: Secondary | ICD-10-CM

## 2022-07-11 DIAGNOSIS — N184 Chronic kidney disease, stage 4 (severe): Secondary | ICD-10-CM

## 2022-07-11 DIAGNOSIS — D62 Acute posthemorrhagic anemia: Secondary | ICD-10-CM

## 2022-07-11 DIAGNOSIS — R4189 Other symptoms and signs involving cognitive functions and awareness: Secondary | ICD-10-CM

## 2022-07-11 DIAGNOSIS — I10 Essential (primary) hypertension: Secondary | ICD-10-CM

## 2022-07-11 NOTE — Progress Notes (Unsigned)
New Patient Office Visit  Subjective    Patient ID: Laurie Lin, female    DOB: 06-Dec-1955  Age: 67 y.o. MRN: [id number]  CC:  Chief Complaint  Patient presents with   New Patient (Initial Visit)    HPI Laurie Lin presents to establish care. Her previous PCP was at [place]. She had G tube inserted while she was in the hospital at [facility] because she has   She has been hospitalized due to alerted mental states, alcohol abuse and wernicke korsakoff.   She denies blood in urine, stool, SOB and .  Left eye injection at [facility].   Liquid tylenol. .  She is seen by endocronologist at [facility].  She is eating and drinking.   They are irrigating the pg  tube daily.   She has been recently discharged from the rehab following hemorrhagic anemia, altered mental status, she has past medical history of CKD, diabetes, esophageal tear.  G-tube placed right lower quadrant and cognitive deficits She is able to ambulate without any assistance and able to and is not using the G-tube.  She has has not been followed by GI.. ***  Health Maintenance  Topic Date Due   Medicare Annual Wellness (AWV)  Never done   COVID-19 Vaccine (1) Never done   FOOT EXAM  Never done   OPHTHALMOLOGY EXAM  Never done   Diabetic kidney evaluation - Urine ACR  Never done   Hepatitis C Screening  Never done   Zoster Vaccines- Shingrix (1 of 2) Never done   COLONOSCOPY (Pts 45-4yrs Insurance coverage will need to be confirmed)  Never done   MAMMOGRAM  Never done   Pneumonia Vaccine 96+ Years old (1 of 1 - PCV) Never done   DEXA SCAN  Never done   INFLUENZA VACCINE  11/19/2021   HEMOGLOBIN A1C  10/18/2022   Diabetic kidney evaluation - eGFR measurement  06/10/2023   DTaP/Tdap/Td (2 - Td or Tdap) 12/23/2028   HPV VACCINES  Aged Out    There are no preventive care reminders to display for this patient.  Outpatient Encounter Medications as of 07/11/2022  Medication Sig    acetaminophen (TYLENOL) 500 MG tablet Place 1 tablet (500 mg total) into feeding tube 2 (two) times daily as needed for mild pain, fever or headache.   cholecalciferol (CHOLECALCIFEROL) 25 MCG tablet Place 2 tablets (2,000 Units total) into feeding tube daily.   Continuous Blood Gluc Sensor (DEXCOM G6 SENSOR) MISC by Does not apply route.   cyanocobalamin 1000 MCG tablet Place 1 tablet (1,000 mcg total) into feeding tube daily.   folic acid (FOLVITE) 1 MG tablet Place 1 tablet (1 mg total) into feeding tube daily.   insulin aspart (NOVOLOG) 100 UNIT/ML injection Inject 3 Units into the skin every 4 (four) hours. With bolus tube feed.   insulin aspart (NOVOLOG) 100 UNIT/ML injection Inject 0-9 Units into the skin every 4 (four) hours. CBG 70 - 120: 0 units CBG 121 - 150: 1 unit CBG 151 - 200: 2 units CBG 201 - 250: 3 units CBG 251 - 300: 5 units CBG 301 - 350: 7 units CBG 351 - 400: 9 units   Insulin Glargine (BASAGLAR KWIKPEN) 100 UNIT/ML Inject 8 Units into the skin at bedtime.   Insulin Pen Needle 32G X 4 MM MISC ok to sub any brand or size needle preferred by insurance/patient, use up to 5x/day, dx E10.65   magnesium hydroxide (MILK OF MAGNESIA)  400 MG/5ML suspension Place 30 mLs into feeding tube daily as needed for mild constipation.   midodrine (PROAMATINE) 5 MG tablet Place 1 tablet (5 mg total) into feeding tube 3 (three) times daily with meals.   Multiple Vitamin (MULTIVITAMIN WITH MINERALS) TABS tablet Place 1 tablet into feeding tube daily.   Netarsudil Dimesylate (RHOPRESSA) 0.02 % SOLN Place 1 drop into both eyes every evening.   Nutritional Supplements (FEEDING SUPPLEMENT, OSMOLITE 1.5 CAL,) LIQD Place 237 mLs into feeding tube 6 (six) times daily.   ondansetron (ZOFRAN) 4 MG tablet Place 1 tablet (4 mg total) into feeding tube every 6 (six) hours as needed for nausea.   ONETOUCH DELICA LANCETS 99991111 MISC 1 Device by Does not apply route 4 (four) times daily.   pantoprazole sodium  (PROTONIX) 40 mg Place 40 mg into feeding tube 2 (two) times daily.   QUEtiapine (SEROQUEL) 25 MG tablet Place 0.5 tablets (12.5 mg total) into feeding tube at bedtime as needed (agitaion, insomnia).   thiamine (VITAMIN B1) 100 MG tablet Place 1 tablet (100 mg total) into feeding tube daily.   Travoprost, BAK Free, (TRAVATAN) 0.004 % SOLN ophthalmic solution Place 1 drop into both eyes at bedtime.   Water For Irrigation, Sterile (FREE WATER) SOLN Place 200 mLs into feeding tube 6 (six) times daily.   [DISCONTINUED] sertraline (ZOLOFT) 50 MG tablet TAKE 1 TABLET BY MOUTH EVERY DAY   No facility-administered encounter medications on file as of 07/11/2022.    Past Medical History:  Diagnosis Date   Diabetes mellitus without complication (HCC)    GERD (gastroesophageal reflux disease)    Hypertension    Renal disorder    stage 3     Past Surgical History:  Procedure Laterality Date   DILATION AND CURETTAGE OF UTERUS     Miscarriage   ESOPHAGOGASTRODUODENOSCOPY (EGD) WITH PROPOFOL N/A 05/22/2022   Procedure: ESOPHAGOGASTRODUODENOSCOPY (EGD) WITH PROPOFOL;  Surgeon: Toledo, Benay Pike, MD;  Location: [facility] ENDOSCOPY;  Service: Gastroenterology;  Laterality: N/A;   EYE SURGERY     bilateral caterac   FINGER SURGERY     IR GASTROSTOMY TUBE MOD SED  04/29/2022   IR REPLC GASTRO/COLONIC TUBE PERCUT W/FLUORO  06/04/2022   KNEE ARTHROSCOPY Left 1989   [facility]   PILONIDAL CYST DRAINAGE      Family History  Problem Relation Age of Onset   Diabetes Mother     Social History   Socioeconomic History   Marital status: Married    Spouse name: Not on file   Number of children: Not on file   Years of education: Not on file   Highest education level: Not on file  Occupational History   Not on file  Tobacco Use   Smoking status: Never   Smokeless tobacco: Never  Vaping Use   Vaping Use: Never used  Substance and Sexual Activity   Alcohol use: Yes    Alcohol/week: 6.0 standard drinks of alcohol     Types: 3 Cans of beer, 3 Shots of liquor per week    Comment: heavy drinker   Drug use: No   Sexual activity: Yes  Other Topics Concern   Not on file  Social History Narrative   Not on file   Social Determinants of Health   Financial Resource Strain: Not on file  Food Insecurity: No Food Insecurity (05/09/2022)   Hunger Vital Sign    Worried About Running Out of Food in the Last Year: Never true    Ran  Out of Food in the Last Year: Never true  Transportation Needs: No Transportation Needs (05/09/2022)   PRAPARE - Hydrologist (Medical): No    Lack of Transportation (Non-Medical): No  Physical Activity: Not on file  Stress: Not on file  Social Connections: Not on file  Intimate Partner Violence: Not At Risk (05/09/2022)   Humiliation, Afraid, Rape, and Kick questionnaire    Fear of Current or Ex-Partner: No    Emotionally Abused: No    Physically Abused: No    Sexually Abused: No    ROS      Objective    LMP 09/09/2004 (Approximate)   Physical Exam  {Labs (Optional):23779}    Assessment & Plan:  There are no diagnoses linked to this encounter.  No follow-ups on file.   [name], NP

## 2022-07-11 NOTE — Patient Instructions (Signed)
Referral sent to [facility]. Please get the complete panel while labs at Dr. Keturah Barre office on Monday.

## 2022-07-13 DIAGNOSIS — R4189 Other symptoms and signs involving cognitive functions and awareness: Secondary | ICD-10-CM | POA: Insufficient documentation

## 2022-07-13 DIAGNOSIS — R600 Localized edema: Secondary | ICD-10-CM | POA: Insufficient documentation

## 2022-07-13 DIAGNOSIS — N184 Chronic kidney disease, stage 4 (severe): Secondary | ICD-10-CM | POA: Insufficient documentation

## 2022-07-13 NOTE — Assessment & Plan Note (Signed)
Patient BP  Vitals:   07/11/22 1335  BP: 128/68    in the office today. Advised pt to follow a low sodium and heart healthy diet.

## 2022-07-13 NOTE — Assessment & Plan Note (Signed)
Her previous GFR 29, creatinine 1.88 and BUN 28 on 06/09/2022. She is followed by nephrologist Dr. Candiss Norse

## 2022-07-13 NOTE — Assessment & Plan Note (Addendum)
Husband states that she is not at her baseline. She is oriented to person and place. The spouse want to hold off for neurology consult at present.

## 2022-07-13 NOTE — Assessment & Plan Note (Signed)
Her last hemoglobin A1c 7.2 on 04/18/2022. She is on insulin and is followed by endocrinologist.

## 2022-07-13 NOTE — Progress Notes (Incomplete)
New Patient Office Visit  Subjective    Patient ID: Laurie Lin, female    DOB: [birth year]  Age: 67 y.o. MRN: [id number]  CC:  Chief Complaint  Patient presents with  . New Patient (Initial Visit)    Discuss getting feeding tube taken out    HPI Laurie Lin presents to establish care accompanied with her husband.  Her previous PCP was at [place].  She has history of hypertension, type 1 diabetes, CKD stage 4, alcohol disorder and AMS encephalopathy.  She has back-to-back hospitalization.  She was first hospitalized [id number] due to alcoholic encephalopathy.  She was intubated due to worsening obtundation and was placed  on PEG  tube.  She was discharged with PEG to Skilled nursing facility on 05/06/21.  She was again hospitalized due to GI bleed.  She was supposed to follow-up with GI but has not been scheduled yet.  She is followed by endocrinologist at [facility] and nephrologist.  The spouse states that her p.o. intake is getting better since she has been discharged from the SNF and has not used the PEG tube for feeding.  Patient denies any shortness of breath, chest pain, hemoptysis or hematochezia at present. ***  Health Maintenance  Topic Date Due  . Medicare Annual Wellness (AWV)  Never done  . FOOT EXAM  Never done  . OPHTHALMOLOGY EXAM  Never done  . Diabetic kidney evaluation - Urine ACR  Never done  . Hepatitis C Screening  Never done  . Zoster Vaccines- Shingrix (1 of 2) Never done  . COLONOSCOPY (Pts 45-28yrs Insurance coverage will need to be confirmed)  Never done  . MAMMOGRAM  Never done  . Pneumonia Vaccine 91+ Years old (1 of 1 - PCV) Never done  . DEXA SCAN  Never done  . INFLUENZA VACCINE  07/20/2022 (Originally 11/19/2021)  . HEMOGLOBIN A1C  10/18/2022  . Diabetic kidney evaluation - eGFR measurement  06/10/2023  . DTaP/Tdap/Td (2 - Td or Tdap) 12/23/2028  . HPV VACCINES  Aged Out  . COVID-19 Vaccine  Discontinued    There are no preventive care  reminders to display for this patient.  Outpatient Encounter Medications as of 07/11/2022  Medication Sig  . acetaminophen (TYLENOL) 500 MG tablet Place 1 tablet (500 mg total) into feeding tube 2 (two) times daily as needed for mild pain, fever or headache.  . cholecalciferol (CHOLECALCIFEROL) 25 MCG tablet Place 2 tablets (2,000 Units total) into feeding tube daily.  . Continuous Blood Gluc Sensor (DEXCOM G6 SENSOR) MISC by Does not apply route.  . cyanocobalamin 1000 MCG tablet Place 1 tablet (1,000 mcg total) into feeding tube daily.  . folic acid (FOLVITE) 1 MG tablet Place 1 tablet (1 mg total) into feeding tube daily.  . insulin aspart (NOVOLOG) 100 UNIT/ML injection Inject 3 Units into the skin every 4 (four) hours. With bolus tube feed.  . insulin aspart (NOVOLOG) 100 UNIT/ML injection Inject 0-9 Units into the skin every 4 (four) hours. CBG 70 - 120: 0 units CBG 121 - 150: 1 unit CBG 151 - 200: 2 units CBG 201 - 250: 3 units CBG 251 - 300: 5 units CBG 301 - 350: 7 units CBG 351 - 400: 9 units  . Insulin Glargine (BASAGLAR KWIKPEN) 100 UNIT/ML Inject 8 Units into the skin at bedtime.  . Insulin Pen Needle 32G X 4 MM MISC ok to sub any brand or size needle preferred by insurance/patient, use up to 5x/day, dx  E10.65  . magnesium hydroxide (MILK OF MAGNESIA) 400 MG/5ML suspension Place 30 mLs into feeding tube daily as needed for mild constipation.  . midodrine (PROAMATINE) 5 MG tablet Place 1 tablet (5 mg total) into feeding tube 3 (three) times daily with meals.  . Multiple Vitamin (MULTIVITAMIN WITH MINERALS) TABS tablet Place 1 tablet into feeding tube daily.  Mckinley Jewel Dimesylate (RHOPRESSA) 0.02 % SOLN Place 1 drop into both eyes every evening.  . Nutritional Supplements (FEEDING SUPPLEMENT, OSMOLITE 1.5 CAL,) LIQD Place 237 mLs into feeding tube 6 (six) times daily.  . ondansetron (ZOFRAN) 4 MG tablet Place 1 tablet (4 mg total) into feeding tube every 6 (six) hours as needed  for nausea.  Glory Rosebush DELICA LANCETS [id number] MISC 1 Device by Does not apply route 4 (four) times daily.  . pantoprazole sodium (PROTONIX) 40 mg Place 40 mg into feeding tube 2 (two) times daily.  Marland Kitchen thiamine (VITAMIN B1) 100 MG tablet Place 1 tablet (100 mg total) into feeding tube daily.  . Travoprost, BAK Free, (TRAVATAN) 0.004 % SOLN ophthalmic solution Place 1 drop into both eyes at bedtime.  . Water For Irrigation, Sterile (FREE WATER) SOLN Place 200 mLs into feeding tube 6 (six) times daily.  . [DISCONTINUED] QUEtiapine (SEROQUEL) 25 MG tablet Place 0.5 tablets (12.5 mg total) into feeding tube at bedtime as needed (agitaion, insomnia).  . [DISCONTINUED] sertraline (ZOLOFT) 50 MG tablet TAKE 1 TABLET BY MOUTH EVERY DAY   No facility-administered encounter medications on file as of 07/11/2022.    Past Medical History:  Diagnosis Date  . Alcohol abuse   . Diabetes mellitus without complication ([place])   . GERD (gastroesophageal reflux disease)   . Hypertension   . Renal disorder    stage 3     Past Surgical History:  Procedure Laterality Date  . DILATION AND CURETTAGE OF UTERUS     Miscarriage  . ESOPHAGOGASTRODUODENOSCOPY (EGD) WITH PROPOFOL N/A 05/22/2022   Procedure: ESOPHAGOGASTRODUODENOSCOPY (EGD) WITH PROPOFOL;  Surgeon: Toledo, Benay Pike, MD;  Location: [facility] ENDOSCOPY;  Service: Gastroenterology;  Laterality: N/A;  . EYE SURGERY     bilateral caterac  . FINGER SURGERY    . IR GASTROSTOMY TUBE MOD SED  04/29/2022  . IR REPLC GASTRO/COLONIC TUBE PERCUT W/FLUORO  06/04/2022  . Lin ARTHROSCOPY Left 1989   [facility]  . PILONIDAL CYST DRAINAGE      Family History  Problem Relation Age of Onset  . Depression Mother   . Diabetes Mother     Social History   Socioeconomic History  . Marital status: Married    Spouse name: Not on file  . Number of children: Not on file  . Years of education: Not on file  . Highest education level: Not on file  Occupational History  . Not on  file  Tobacco Use  . Smoking status: Never  . Smokeless tobacco: Never  Vaping Use  . Vaping Use: Never used  Substance and Sexual Activity  . Alcohol use: Yes    Alcohol/week: 6.0 standard drinks of alcohol    Types: 3 Cans of beer, 3 Shots of liquor per week    Comment: heavy drinker  . Drug use: No  . Sexual activity: Yes  Other Topics Concern  . Not on file  Social History Narrative  . Not on file   Social Determinants of Health   Financial Resource Strain: Not on file  Food Insecurity: No Food Insecurity (05/09/2022)   Hunger Vital Sign   .  Worried About Charity fundraiser in the Last Year: Never true   . Ran Out of Food in the Last Year: Never true  Transportation Needs: No Transportation Needs (05/09/2022)   PRAPARE - Transportation   . Lack of Transportation (Medical): No   . Lack of Transportation (Non-Medical): No  Physical Activity: Not on file  Stress: Not on file  Social Connections: Not on file  Intimate Partner Violence: Not At Risk (05/09/2022)   Humiliation, Afraid, Rape, and Kick questionnaire   . Fear of Current or Ex-Partner: No   . Emotionally Abused: No   . Physically Abused: No   . Sexually Abused: No    Review of Systems  Constitutional: Negative.   HENT: Negative.    Eyes: Negative.   Respiratory:  Negative for cough and shortness of breath.   Cardiovascular:  Positive for leg swelling. Negative for palpitations.  Gastrointestinal:        PEG tube  Genitourinary: Negative.   Musculoskeletal: Negative.   Neurological:  Negative for tremors and headaches.  Psychiatric/Behavioral: Negative.          Objective    BP 128/68 (BP Location: Left Arm, Patient Position: Sitting, Cuff Size: Normal)   Pulse 87   Temp 97.6 F (36.4 C) (Oral)   Ht 5\' 10"  (1.778 m)   Wt 179 lb (81.2 kg)   LMP 09/09/2004 (Approximate)   SpO2 97%   BMI 25.68 kg/m   Physical Exam Constitutional:      General: She is not in acute distress.    Appearance:  Normal appearance.  HENT:     Head: Normocephalic.     Right Ear: Tympanic membrane normal.     Left Ear: Tympanic membrane normal.     Nose: Nose normal.     Mouth/Throat:     Mouth: Mucous membranes are moist.     Pharynx: Oropharynx is clear.  Eyes:     Extraocular Movements: Extraocular movements intact.     Conjunctiva/sclera: Conjunctivae normal.     Pupils: Pupils are equal, round, and reactive to light.  Cardiovascular:     Rate and Rhythm: Normal rate and regular rhythm.  Pulmonary:     Effort: Pulmonary effort is normal.     Breath sounds: Normal breath sounds. No stridor. No wheezing.  Abdominal:     General: Bowel sounds are normal.     Palpations: Abdomen is soft.     Comments: PEG tube present  Musculoskeletal:     Cervical back: Normal range of motion.     Right lower leg: Edema present.     Left lower leg: Edema present.  Skin:    Coloration: Skin is not jaundiced.     Findings: No erythema.  Neurological:     Mental Status: She is alert.     Comments: Impaired cognitive ability.     {Labs (Optional):23779}    Assessment & Plan:  Cognitive impairment Assessment & Plan: Husband states that she is not at her baseline. She is oriented to person and place. The spouse want to hold off for neurology consult at present.   Acute post-hemorrhagic anemia -     Ambulatory referral to Speech Therapy -     Ambulatory referral to Gastroenterology  Essential hypertension Assessment & Plan: Patient BP  Vitals:   07/11/22 1335  BP: 128/68    in the office today. Advised pt to follow a low sodium and heart healthy diet.     Lower extremity edema  Type 1 diabetes mellitus with hyperglycemia ([place]) Assessment & Plan: Her last hemoglobin A1c 7.2 on 04/18/2022. She is on insulin and is followed by endocrinologist.   CKD (chronic kidney disease), stage IV ([place]) Assessment & Plan: Her previous GFR 29, creatinine 1.88 and BUN 28 on 06/09/2022. She is  followed by nephrologist Dr. Candiss Norse     No follow-ups on file.   [name], NP

## 2022-07-14 ENCOUNTER — Telehealth: Payer: Self-pay

## 2022-07-14 NOTE — Telephone Encounter (Signed)
Spoke with Burman Nieves and was advised that they have received everything that was needed. Nothing further needed at this time.

## 2022-07-14 NOTE — Assessment & Plan Note (Signed)
She has slight edema bilaterally. Advised to elevate the feet.

## 2022-07-14 NOTE — Telephone Encounter (Signed)
[place] called from [facility] to state they have sent multiple faxes to request additional visits for Nursing, PT, and OT for patient and have not heard back from [place].  [place] states she would like for [place] to please call their main office at [phone] and speak with [facility].

## 2022-07-14 NOTE — Assessment & Plan Note (Signed)
We reached to [facility] clinic via call to check her appointment but the patient has been dismissed from the facility. Referral sent to  GI.

## 2022-07-15 ENCOUNTER — Telehealth: Payer: Self-pay | Admitting: Nurse Practitioner

## 2022-07-15 NOTE — Telephone Encounter (Signed)
Patient's husband called [name] at [facility], Interventional  Radiology. Patient's husband called to make an appointment to have his wife's feeding tube removed. [name] says there are not orders from [place] to remove tube. Please call Janett at [phone].

## 2022-07-16 NOTE — Telephone Encounter (Signed)
Voice message left on [facility] Interventional radiology. I am waiting for the evaluation by speech therapist and GI consult before removing the G- tube.

## 2022-07-21 NOTE — Telephone Encounter (Signed)
I spoke with Laurie Lin from [facility] and patient is not an established patient with [facility] Gastroenter[facility].  [name] states since Interventional Radiology put the feeding tube in, they will need to take it out.

## 2022-07-21 NOTE — Telephone Encounter (Signed)
[name] from [facility] intervention radiology, [phone]. She keeps getting calls from husband about G tube. [name] would like someone from office to call husband and explain that patient must see GI first. [name] said she received a message from [facility] [employer] that she wanted to speak to the patient and tell her she wanted her to have the GI consult first before possible removal. Patient's husband  keeps on telling [name] that there is an order to remove.

## 2022-07-21 NOTE — Telephone Encounter (Signed)
Patient DPR  husband says " I am not taking my wife to no evaluation by speech therapy and/or GI" states my wife is eating  anything and everything normally and when patient does not  gives h ensure. Husband  Mr Ringor  demands and order to be placed and remove G-tube or "I will remove the thing my D**n myself" in a very loud voice. I tried to advise the evaluation by speech therapy and GI was for patient safety, Mr. Frenz started to yell again I advised I would send to provider. " I will remove it if order is not given".

## 2022-07-21 NOTE — Telephone Encounter (Signed)
Patient's husband, Jayleanna Pruitt, did state he would like for [name], NP, to know that patient ate eggs and ham today.  I forgot to include this in my earlier note.

## 2022-07-21 NOTE — Telephone Encounter (Signed)
Patient's husband, Laurie Lin, called to state patient has not been using her feeding tube at all since arriving home. [name] states patient is eating normal food without a problem.  [name] states patient is drinking boost without a problem.  [name] states he has left three messages with [name] [phone]), who schedules these types of procedures, this morning and he has not heard back from her yet.  [name] asked if I would call [name].  I reached out to [place] and left a voicemail.  [name] called back while I was helping someone else and [name] spoke with her (see Karen's note).  I checked patient's record for referral to GI.  I called [facility] at [facility] and spoke with [name].  I let [name] know that we did send a referral for patient to their office and I wanted to follow-up to see if they have everything they need to go ahead and schedule the appointment.  I explained that patient's husband is ready to go ahead and have patient's feeding tube removed.  [name] states [name], NP, asked for patient to be seen in 2 weeks, but since the referral was not marked "Urgent", it would probably be June before patient would be seen.  I let [name] know that patient's husband is very concerned about infection.  [name] states since [name], NP, did note that she wanted patient to be seen in 2 weeks and I'm calling today, she will go ahead and schedule.  I asked [name] to hold while I called patient's husband, [name], back so we could schedule appointment.  I explained to [name] that [name] states the process is for patient to be seen by GI before scheduling removal of feeding tube.  [name] stated that he does not see any reason that patient should have to be seen by GI before removing the feeding tube since she is eating and drinking without a problem, and he refused to schedule appointment.  I let him know that I will relay his message to [name],  NP.  I let him know that [name], NP, will be back in office on Thursday (07/24/2022).  I thanked [name] for her help.  [name] stated if patient agreed to GI visit, that she would actually need to be scheduled with [facility], since she is already an established patient with that office.

## 2022-07-22 ENCOUNTER — Telehealth: Payer: Self-pay

## 2022-07-22 ENCOUNTER — Other Ambulatory Visit: Payer: Self-pay | Admitting: Internal Medicine

## 2022-07-22 DIAGNOSIS — R1310 Dysphagia, unspecified: Secondary | ICD-10-CM

## 2022-07-22 NOTE — Progress Notes (Signed)
Order placed for Speech Therapy referral and for Modified barium swallow after speaking to Happi.

## 2022-07-22 NOTE — Telephone Encounter (Addendum)
Pt husband called again stating he is waiting on a person name janet (scheduler) to schedule an appointment for his wife to take out a feeding tube. Pt husband is afraid it may get affected

## 2022-07-22 NOTE — Telephone Encounter (Signed)
Patient's husband, Shatavia Tippery, called to state patient is experiencing oozing on the backside of the tube.  [name] states they ran out of gauze.  Angelica came out 07/17/2022, and they were going to order more gauze, but now patient is experiencing oozing and there is a bad odor with it.  [name] states a nurse will be coming out from [place] tomorrow to look at it.  [name] states patient really needs to have this feeding tube out as soon as possible.  [name] states patient is eating food normally, he doesn't even have to chop it up.  [name] would like to know what they need to do to get the feeding tube removed.  [name] states we may call him at [phone].

## 2022-07-22 NOTE — Telephone Encounter (Addendum)
Patient husband is wanting G-tube removed without evaluation.By speech Therapy or GI . Patient husband would like the G-tube removed. :

## 2022-07-22 NOTE — Telephone Encounter (Signed)
Per Juliann Pulse, Dr. Nicki Reaper has spoken with husband today & he has agreed to take wife to Speech Therapy so they could have the feeding tube removed.

## 2022-07-22 NOTE — Telephone Encounter (Signed)
Thank you Dr. Nicki Reaper and Ms. Juliann Pulse.

## 2022-07-23 NOTE — Telephone Encounter (Signed)
Late entry.  Called and spoke to Laurie Lin (07/22/22 - 12:30pm).  Discussed the feeding tube and recent hospitalization.  Discussed concerns in the hospital regarding her swallowing and cognition.  Discussed that I spoke to Laurie Lin (speech therapist).  She is familiar with Laurie Lin and assessed her while she was in the hospital.  Explained that Laurie Lin would need a speech therapy consult and modified barium swallow - to reassess her swallowing along with reassessing her cognitive ability to understand eating/swallowing, etc.  Pending results of swallowing and speech evaluation, then can determine further treatment and if removal is an option.  He mentioned some oozing.  Reviewed recent note. Agree with assessment of feeding tube as planned.  Discussed all of the above with Laurie Lin.  He was in agreement with plan.

## 2022-07-23 NOTE — Telephone Encounter (Signed)
Patient's husband, Indianna Sayson, called to state he has not heard from anyone regarding scheduling the swallowing test for patient before her feeding tube can be removed.    I spoke with Kerin Salen, RN.  She states Dr. Einar Pheasant did send the referral to Speech Therapy late yesterday afternoon after speaking with Elray Buba.  Kerin Salen, RN, states [name] may call Speech Therapy at [phone] to schedule an appointment with Happi (speech therapist).

## 2022-07-25 ENCOUNTER — Ambulatory Visit
Admission: RE | Admit: 2022-07-25 | Discharge: 2022-07-25 | Disposition: A | Discharge status: Home / Self Care | Payer: Medicare Other | Source: Ambulatory Visit | Attending: Internal Medicine | Admitting: Internal Medicine

## 2022-07-25 DIAGNOSIS — R1310 Dysphagia, unspecified: Secondary | ICD-10-CM | POA: Insufficient documentation

## 2022-07-25 NOTE — Procedures (Addendum)
Modified Barium Swallow Study  Patient Details  Name: Laurie Lin MRN: [id number] Date of Birth: 01-18-1956  Today's Date: 07/25/2022  Modified Barium Swallow completed.  Full report located under Chart Review in the Imaging Section.  History of Present Illness Pt is a 67 year old female with multiple hospitalizations for Wernicke's-Korsakoff syndrome including PEG placement. Pt currently resides at home. Her husband reports that she has been consuming POs with no use of PEG for nutrition. Per chart, pt's weight appears stable since previous admission.   Clinical Impression Pt presents with improved cognitive function. She was attentive throughout study and able to follow directions. During this evaluation, she presents with adequate oropharyngeal abilities when consuming thin liquids via cup and straw, puree, graham crackers with barium paste as well as whole barium tablet with thin liquids via straw. Of note, the barium tablet stuck to pt's hard palate as the tablet became "sticky." Applesauce was helpful in oral phase followed by consecutive sips of thin liquids via straw.   At this time, pt's risk of aspiration appears reduced when consuming a regular diet with thin liquids and medications whole with thin liquids. All results conveyed to pt and her husband. No further ST services are indicated at this time.  This Clinical research associate will reach out to pt's PCP regarding these results for possible discussion regarding removal of PEG.    Factors that may increase risk of adverse event in presence of aspiration Rubye Oaks & Clearance Coots 2021):  N/A  Swallow Evaluation Recommendations Recommendations: PO diet PO Diet Recommendation: Regular;Thin liquids (Level 0) Liquid Administration via: Cup;Straw Medication Administration: Whole meds with liquid Supervision: Patient able to self-feed Swallowing strategies  : Minimize environmental distractions;Slow rate;Small bites/sips Oral care recommendations: Oral  care BID (2x/day)    Gaylen Venning B. Dreama Saa, M.S., CCC-SLP, Tree surgeon Certified Brain Injury Specialist [facility]  [facility] Office [phone] Ascom [phone] Fax [phone]

## 2022-07-28 ENCOUNTER — Telehealth: Payer: Self-pay

## 2022-07-28 NOTE — Telephone Encounter (Signed)
Lin's husband, Laurie Lin, states he would like for [place] to please call and let him know the next step.

## 2022-07-28 NOTE — Telephone Encounter (Signed)
Patient's husband, Kasmira Lukowski, called to state patient met with Happi from Speech Therapy on Friday (07/25/2022).  [name] states Happi states patient was perfect with her swallowing. [name] would like to know what their next step is to remove feeding tube for patient.

## 2022-07-29 ENCOUNTER — Other Ambulatory Visit: Payer: Self-pay | Admitting: Internal Medicine

## 2022-07-29 ENCOUNTER — Encounter: Payer: Self-pay | Admitting: Internal Medicine

## 2022-07-29 NOTE — Progress Notes (Signed)
Opened in error

## 2022-07-29 NOTE — Telephone Encounter (Signed)
Patient's husband, Aneliese Varland, called to follow-up on his call from yesterday regarding patient's next step to have her feeding tube removed.  I spoke with Jenate [place], CMA, and she is going to check with Kara Dies, NP, and call [name] back at [phone].

## 2022-07-30 ENCOUNTER — Other Ambulatory Visit: Payer: Self-pay | Admitting: Internal Medicine

## 2022-07-30 DIAGNOSIS — Z431 Encounter for attention to gastrostomy: Secondary | ICD-10-CM

## 2022-08-01 ENCOUNTER — Ambulatory Visit
Admission: RE | Admit: 2022-08-01 | Discharge: 2022-08-01 | Disposition: A | Discharge status: Home / Self Care | Payer: Medicare Other | Source: Ambulatory Visit | Attending: Internal Medicine | Admitting: Internal Medicine

## 2022-08-01 DIAGNOSIS — Z431 Encounter for attention to gastrostomy: Secondary | ICD-10-CM

## 2022-08-01 HISTORY — PX: IR RADIOLOGIST EVAL & MGMT: IMG5224

## 2022-08-01 NOTE — Procedures (Signed)
PROCEDURE SUMMARY:  Successful removal of gastrostomy tube.  No complications.   EBL = trace  Please see full dictation in imaging section of Epic for procedure details.  Discuss below with the patient.  - no shower for 24 hours  - keep the dressing on while taking shower, take off the dressing immediately after shower and pat dry the area completely before place a new dressing - may place a new dressing until fully heals - do not submerge (sitting in a tub, swimming) the previous G tube site for 7 days   - recommended eating small meals for couple days, inform the patient that there may be a small leak from the previous G tube site - encourage to reach out to IR for questions and concerns   Patient verbalized understanding.   Kennieth Francois PA-C 08/01/2022 2:39 PM

## 2022-08-14 ENCOUNTER — Telehealth: Payer: Self-pay | Admitting: Nurse Practitioner

## 2022-08-14 NOTE — Telephone Encounter (Signed)
Pt daughter called in staying that as per pt last visit with provider, pt was to get a referral for Neurology? She she would like to f/u on that. She also mentioned, that pt husband can also be reach at if any questions. They worried about pt, mental health.  Pt also nee refill on...  Prescription Request  08/14/2022  LOV: 07/11/2022  What is the name of the medication or equipment? sertraline (ZOLOFT) 50 MG tablet  Have you contacted your pharmacy to request a refill? No   Which pharmacy would you like this sent to?  [id number] - [place], [place] - [address] [place] [address] Phone: [phone] Fax: [phone]    Patient notified that their request is being sent to the clinical staff for review and that they should receive a response within 2 business days.   Please advise at Mobile [phone] (mobile)

## 2022-08-15 ENCOUNTER — Other Ambulatory Visit: Payer: Self-pay | Admitting: Nurse Practitioner

## 2022-08-15 DIAGNOSIS — R4189 Other symptoms and signs involving cognitive functions and awareness: Secondary | ICD-10-CM

## 2022-08-15 NOTE — Telephone Encounter (Signed)
Pt daughter called back. She aware of referral. She also mentioned for that Zoloft refills that its 25 mg, [facility]. See previous message below.

## 2022-08-16 NOTE — Telephone Encounter (Signed)
According to her medical record zoloft is not in her active medication list and was last prescribed in 2021. If she is experiencing any acute symptoms then she needs be evaluated before starting the medication.

## 2022-08-18 NOTE — Telephone Encounter (Signed)
Spoke to daughter Marijean Niemann to let her know that her Mother does need a follow up from being in the nursing home. Marijean Niemann states she will speak with her Dad and call back to schedule that.

## 2022-08-18 NOTE — Telephone Encounter (Signed)
Spoke with husband Daziah Hesler), & he mentioned that she was on the Zoloft in the nursing home. She is not experiencing any symptoms, they are just requesting a refill to [facility].  May notify via mychart once completed

## 2022-08-20 ENCOUNTER — Other Ambulatory Visit: Payer: Self-pay | Admitting: Nurse Practitioner

## 2022-08-20 MED ORDER — SERTRALINE HCL 25 MG PO TABS
25.0000 mg | ORAL_TABLET | Freq: Every day | ORAL | 1 refills | Status: DC
Start: 2022-08-20 — End: 2022-12-26

## 2022-08-21 ENCOUNTER — Ambulatory Visit: Payer: Medicare Other | Admitting: Nurse Practitioner

## 2022-08-22 ENCOUNTER — Ambulatory Visit (INDEPENDENT_AMBULATORY_CARE_PROVIDER_SITE_OTHER): Payer: Medicare Other | Admitting: Nurse Practitioner

## 2022-08-22 ENCOUNTER — Encounter: Payer: Self-pay | Admitting: Nurse Practitioner

## 2022-08-22 VITALS — BP 110/62 | HR 76 | Temp 98.0°F | Ht 70.0 in | Wt 170.6 lb

## 2022-08-22 DIAGNOSIS — F419 Anxiety disorder, unspecified: Secondary | ICD-10-CM

## 2022-08-22 DIAGNOSIS — R4189 Other symptoms and signs involving cognitive functions and awareness: Secondary | ICD-10-CM

## 2022-08-22 DIAGNOSIS — D649 Anemia, unspecified: Secondary | ICD-10-CM | POA: Diagnosis not present

## 2022-08-22 NOTE — Progress Notes (Signed)
Established Patient Office Visit  Subjective:  Patient ID: Laurie Lin, female    DOB: Sep 17, 1955  Age: 67 y.o. MRN: [id number]  CC:  Chief Complaint  Patient presents with   chronic disease management    HPI  Laurie Lin presents for chronic disease management. She is accompanied with her daughter and husband.   The history is provided by the daughter and husband of  the patient.  The daughter reports that the patient cognitive status has been declined.  The daughter also reports that Laurie Lin have started hallucinating.  She reports that Laurie Lin called her daughter stating that they are strangers in their house.    The daughter also reports that she is gets more anxious during the evening time. The family would like to see a neurologist for Laurie Lin.  Daughter also reports that Laurie Lin is sometimes try to mask her symptoms of cognitive decline.  HPI   Past Medical History:  Diagnosis Date   Alcohol abuse    Diabetes mellitus without complication (HCC)    GERD (gastroesophageal reflux disease)    Hypertension    Renal disorder    stage 3     Past Surgical History:  Procedure Laterality Date   DILATION AND CURETTAGE OF UTERUS     Miscarriage   ESOPHAGOGASTRODUODENOSCOPY (EGD) WITH PROPOFOL N/A 05/22/2022   Procedure: ESOPHAGOGASTRODUODENOSCOPY (EGD) WITH PROPOFOL;  Surgeon: Toledo, Boykin Nearing, MD;  Location: [facility];  Service: Gastroenterology;  Laterality: N/A;   EYE SURGERY     bilateral caterac   FINGER SURGERY     IR GASTROSTOMY TUBE MOD SED  04/29/2022   IR RADIOLOGIST EVAL & MGMT  08/01/2022   IR REPLC GASTRO/COLONIC TUBE PERCUT W/FLUORO  06/04/2022   KNEE ARTHROSCOPY Left 1989   [facility]   PILONIDAL CYST DRAINAGE      Family History  Problem Relation Age of Onset   Depression Mother    Diabetes Mother     Social History   Socioeconomic History   Marital status: Married    Spouse name: Not on file   Number of children: Not on file   Years  of education: Not on file   Highest education level: 12th grade  Occupational History   Not on file  Tobacco Use   Smoking status: Never   Smokeless tobacco: Never  Vaping Use   Vaping Use: Never used  Substance and Sexual Activity   Alcohol use: Not on file    Comment: no   Drug use: No   Sexual activity: Yes  Other Topics Concern   Not on file  Social History Narrative   Not on file   Social Determinants of Health   Financial Resource Strain: Patient Declined (08/21/2022)   Overall Financial Resource Strain (CARDIA)    Difficulty of Paying Living Expenses: Patient declined  Food Insecurity: No Food Insecurity (08/21/2022)   Hunger Vital Sign    Worried About Running Out of Food in the Last Year: Never true    Ran Out of Food in the Last Year: Never true  Transportation Needs: No Transportation Needs (08/21/2022)   PRAPARE - Administrator, Civil Service (Medical): No    Lack of Transportation (Non-Medical): No  Physical Activity: Unknown (08/21/2022)   Exercise Vital Sign    Days of Exercise per Week: 0 days    Minutes of Exercise per Session: Not on file  Stress: Stress Concern Present (08/21/2022)   [employer] of  Occupational Health - Occupational Stress Questionnaire    Feeling of Stress : Very much  Social Connections: Moderately Isolated (08/21/2022)   Social Connection and Isolation Panel [NHANES]    Frequency of Communication with Friends and Family: More than three times a week    Frequency of Social Gatherings with Friends and Family: Twice a week    Attends Religious Services: Never    Database administrator or Organizations: No    Attends Engineer, structural: Not on file    Marital Status: Married  Catering manager Violence: Not At Risk (05/09/2022)   Humiliation, Afraid, Rape, and Kick questionnaire    Fear of Current or Ex-Partner: No    Emotionally Abused: No    Physically Abused: No    Sexually Abused: No     Outpatient  Medications Prior to Visit  Medication Sig Dispense Refill   acetaminophen (TYLENOL) 500 MG tablet Place 1 tablet (500 mg total) into feeding tube 2 (two) times daily as needed for mild pain, fever or headache. (Patient taking differently: Take 500 mg by mouth 2 (two) times daily as needed for mild pain, fever or headache.) 30 tablet 0   calcium carbonate (TUMS EX) 750 MG chewable tablet Chew 1 tablet by mouth as needed for heartburn. As needed     cholecalciferol (CHOLECALCIFEROL) 25 MCG tablet Place 2 tablets (2,000 Units total) into feeding tube daily. (Patient taking differently: Take 2,000 Units by mouth daily.)     Continuous Glucose Sensor (DEXCOM G7 SENSOR) MISC 1 Application by Does not apply route every 14 (fourteen) days.     cyanocobalamin 1000 MCG tablet Place 1 tablet (1,000 mcg total) into feeding tube daily. (Patient taking differently: Take 1,000 mcg by mouth daily.)     folic acid (FOLVITE) 1 MG tablet Place 1 tablet (1 mg total) into feeding tube daily. (Patient taking differently: Take 1 mg by mouth daily.)  3   Insulin Glargine (BASAGLAR KWIKPEN) 100 UNIT/ML Inject 8 Units into the skin at bedtime.     insulin lispro (HUMALOG) 100 UNIT/ML injection Inject 0-9 Units into the skin 3 (three) times daily before meals. Sliding scale per Endocrinology     Insulin Pen Needle 32G X 4 MM MISC ok to sub any brand or size needle preferred by insurance/patient, use up to 5x/day, dx E10.65     lisinopril (ZESTRIL) 2.5 MG tablet Take 2.5 mg by mouth daily.     Multiple Vitamin (MULTIVITAMIN WITH MINERALS) TABS tablet Place 1 tablet into feeding tube daily. (Patient taking differently: Take 1 tablet by mouth daily.)     Netarsudil Dimesylate (RHOPRESSA) 0.02 % SOLN Place 1 drop into both eyes every evening.     ONETOUCH DELICA LANCETS 33G MISC 1 Device by Does not apply route 4 (four) times daily. 100 each 11   sertraline (ZOLOFT) 25 MG tablet Take 1 tablet (25 mg total) by mouth daily. 90  tablet 1   thiamine (VITAMIN B1) 100 MG tablet Place 1 tablet (100 mg total) into feeding tube daily. (Patient taking differently: Take 100 mg by mouth daily.)     torsemide (DEMADEX) 20 MG tablet Take 20 mg by mouth daily. As needed     Travoprost, BAK Free, (TRAVATAN) 0.004 % SOLN ophthalmic solution Place 1 drop into both eyes at bedtime.     Continuous Blood Gluc Sensor (DEXCOM G6 SENSOR) MISC by Does not apply route. (Patient not taking: Reported on 08/22/2022)     insulin aspart (NOVOLOG)  100 UNIT/ML injection Inject 3 Units into the skin every 4 (four) hours. With bolus tube feed. (Patient not taking: Reported on 08/22/2022) 10 mL 11   insulin aspart (NOVOLOG) 100 UNIT/ML injection Inject 0-9 Units into the skin every 4 (four) hours. CBG 70 - 120: 0 units CBG 121 - 150: 1 unit CBG 151 - 200: 2 units CBG 201 - 250: 3 units CBG 251 - 300: 5 units CBG 301 - 350: 7 units CBG 351 - 400: 9 units (Patient not taking: Reported on 08/22/2022) 10 mL 11   magnesium hydroxide (MILK OF MAGNESIA) 400 MG/5ML suspension Place 30 mLs into feeding tube daily as needed for mild constipation. (Patient not taking: Reported on 08/22/2022) 355 mL 0   midodrine (PROAMATINE) 5 MG tablet Place 1 tablet (5 mg total) into feeding tube 3 (three) times daily with meals. 90 tablet 0   Nutritional Supplements (FEEDING SUPPLEMENT, OSMOLITE 1.5 CAL,) LIQD Place 237 mLs into feeding tube 6 (six) times daily.  0   ondansetron (ZOFRAN) 4 MG tablet Place 1 tablet (4 mg total) into feeding tube every 6 (six) hours as needed for nausea. (Patient not taking: Reported on 08/22/2022) 20 tablet 0   pantoprazole sodium (PROTONIX) 40 mg Place 40 mg into feeding tube 2 (two) times daily. (Patient not taking: Reported on 08/22/2022)     Water For Irrigation, Sterile (FREE WATER) SOLN Place 200 mLs into feeding tube 6 (six) times daily.     No facility-administered medications prior to visit.    Allergies  Allergen Reactions   Ibuprofen Nausea  Only   Nsaids     Ulcerative stomach and small intestines   Brimonidine Itching    Red, itchy, sensitivity to light Red, itchy, sensitivity to light    Latex Itching    Other reaction(s): Other (see comments)    ROS Review of Systems  Constitutional: Negative.   HENT: Negative.    Eyes: Negative.   Respiratory:  Negative for cough.   Cardiovascular:  Negative for leg swelling.  Gastrointestinal: Negative.   Genitourinary: Negative.   Musculoskeletal: Negative.   Neurological: Negative.   Psychiatric/Behavioral:  Positive for confusion. The patient is not nervous/anxious.       Objective:    Physical Exam Constitutional:      Appearance: Normal appearance.  HENT:     Head: Normocephalic.  Cardiovascular:     Rate and Rhythm: Normal rate and regular rhythm.     Pulses: Normal pulses.     Heart sounds: Normal heart sounds.  Pulmonary:     Effort: Pulmonary effort is normal. No respiratory distress.     Breath sounds: Normal breath sounds. No rhonchi.  Abdominal:     General: Bowel sounds are normal.     Palpations: Abdomen is soft. There is no mass.     Tenderness: There is no abdominal tenderness.     Hernia: No hernia is present.  Musculoskeletal:        General: Normal range of motion.     Cervical back: Neck supple. No tenderness.  Skin:    General: Skin is warm.     Capillary Refill: Capillary refill takes less than 2 seconds.  Neurological:     General: No focal deficit present.     Mental Status: She is alert and oriented to person, place, and time. Mental status is at baseline.  Psychiatric:        Mood and Affect: Mood normal.  Behavior: Behavior normal.        Thought Content: Thought content normal.        Judgment: Judgment normal.     BP 110/62   Pulse 76   Temp 98 F (36.7 C) (Oral)   Ht 5\' 10"  (1.778 m)   Wt 170 lb 9.6 oz (77.4 kg)   LMP 09/09/2004 (Approximate)   SpO2 100%   BMI 24.48 kg/m  Wt Readings from Last 3 Encounters:   08/22/22 170 lb 9.6 oz (77.4 kg)  07/11/22 179 lb (81.2 kg)  06/09/22 180 lb 12.4 oz (82 kg)     Health Maintenance  Topic Date Due   Medicare Annual Wellness (AWV)  Never done   FOOT EXAM  Never done   OPHTHALMOLOGY EXAM  Never done   Diabetic kidney evaluation - Urine ACR  Never done   Hepatitis C Screening  Never done   Zoster Vaccines- Shingrix (1 of 2) Never done   COLONOSCOPY (Pts 45-4yrs Insurance coverage will need to be confirmed)  Never done   MAMMOGRAM  Never done   Pneumonia Vaccine 39+ Years old (1 of 1 - PCV) Never done   DEXA SCAN  Never done   HEMOGLOBIN A1C  10/18/2022   INFLUENZA VACCINE  11/20/2022   Diabetic kidney evaluation - eGFR measurement  06/10/2023   DTaP/Tdap/Td (2 - Td or Tdap) 12/23/2028   HPV VACCINES  Aged Out   COVID-19 Vaccine  Discontinued    There are no preventive care reminders to display for this patient.  Lab Results  Component Value Date   TSH 1.977 04/18/2022   Lab Results  Component Value Date   WBC 9.5 06/09/2022   HGB 7.8 (L) 06/09/2022   HCT 24.5 (L) 06/09/2022   MCV 94.6 06/09/2022   PLT 561 (H) 06/09/2022   Lab Results  Component Value Date   NA 128 (L) 06/09/2022   K 4.0 06/09/2022   CO2 22 06/09/2022   GLUCOSE 414 (H) 06/09/2022   BUN 28 (H) 06/09/2022   CREATININE 1.88 (H) 06/09/2022   BILITOT 0.6 05/28/2022   ALKPHOS 67 05/28/2022   AST 25 05/28/2022   ALT 12 05/28/2022   PROT 7.8 05/28/2022   ALBUMIN 3.2 (L) 05/28/2022   CALCIUM 9.1 06/09/2022   ANIONGAP 11 06/09/2022   No results found for: "CHOL" No results found for: "HDL" No results found for: "LDLCALC" No results found for: "TRIG" No results found for: "CHOLHDL" Lab Results  Component Value Date   HGBA1C 7.2 (H) 04/18/2022      Assessment & Plan:  Cognitive decline Assessment & Plan: Neurology referral in place.    Anemia, unspecified type Assessment & Plan: Advised to continuously take iron supplement. Labs ordered for iron  studies.  Orders: -     CBC with Differential/Platelet -     Iron, TIBC and Ferritin Panel  Anxiety Assessment & Plan: Advised to take Zoloft 25 mg daily. Will continue to monitor.     Follow-up: Return in about 4 months (around 12/23/2022).   Kara Dies, NP

## 2022-08-25 ENCOUNTER — Telehealth: Payer: Self-pay | Admitting: Nurse Practitioner

## 2022-08-25 NOTE — Telephone Encounter (Signed)
Prescription Request  08/25/2022  LOV: 08/22/2022  What is the name of the medication or equipment? insulin lispro (HUMALOG) 100 UNIT/ML injection  Have you contacted your pharmacy to request a refill? Yes   Which pharmacy would you like this sent to?   [id number] [place], [place] - [address] [place] [address] Phone: [phone] Fax: [phone]    Patient notified that their request is being sent to the clinical staff for review and that they should receive a response within 2 business days.   Please advise at Mobile [phone] (mobile)   As per pt spouse, they run out of this.

## 2022-08-25 NOTE — Telephone Encounter (Signed)
Refill sent.

## 2022-09-03 ENCOUNTER — Encounter: Payer: Self-pay | Admitting: Nurse Practitioner

## 2022-09-03 ENCOUNTER — Telehealth: Payer: Self-pay | Admitting: Nurse Practitioner

## 2022-09-03 NOTE — Telephone Encounter (Signed)
Patient's husband called, he has not heard back from the referral for speech and memory therapy at [facility], no one from [facility] has called him. Could Barnetta Hammersmith into this referral. He wants to stay in [place].

## 2022-09-03 NOTE — Telephone Encounter (Signed)
Referrals were placed in April-please contact husband with referral update/progress.  Thanks

## 2022-09-04 NOTE — Telephone Encounter (Signed)
Noted, thanks!

## 2022-09-04 NOTE — Telephone Encounter (Signed)
Noted  

## 2022-09-08 ENCOUNTER — Encounter: Payer: Self-pay | Admitting: Nurse Practitioner

## 2022-09-08 DIAGNOSIS — F419 Anxiety disorder, unspecified: Secondary | ICD-10-CM | POA: Insufficient documentation

## 2022-09-08 NOTE — Assessment & Plan Note (Signed)
Advised to take Zoloft 25 mg daily. Will continue to monitor.

## 2022-09-08 NOTE — Assessment & Plan Note (Signed)
Advised to continuously take iron supplement. Labs ordered for iron studies.

## 2022-09-08 NOTE — Assessment & Plan Note (Signed)
Neurology referral in place

## 2022-09-10 ENCOUNTER — Emergency Department: Payer: Medicare Other

## 2022-09-10 ENCOUNTER — Encounter: Payer: Self-pay | Admitting: Emergency Medicine

## 2022-09-10 ENCOUNTER — Other Ambulatory Visit: Payer: Self-pay

## 2022-09-10 ENCOUNTER — Emergency Department
Admission: EM | Admit: 2022-09-10 | Discharge: 2022-09-10 | Disposition: A | Discharge status: Home / Self Care | Payer: Medicare Other | Attending: Emergency Medicine | Admitting: Emergency Medicine

## 2022-09-10 DIAGNOSIS — N390 Urinary tract infection, site not specified: Secondary | ICD-10-CM | POA: Diagnosis not present

## 2022-09-10 DIAGNOSIS — R4182 Altered mental status, unspecified: Secondary | ICD-10-CM | POA: Insufficient documentation

## 2022-09-10 DIAGNOSIS — R531 Weakness: Secondary | ICD-10-CM | POA: Insufficient documentation

## 2022-09-10 DIAGNOSIS — I10 Essential (primary) hypertension: Secondary | ICD-10-CM | POA: Insufficient documentation

## 2022-09-10 DIAGNOSIS — E119 Type 2 diabetes mellitus without complications: Secondary | ICD-10-CM | POA: Insufficient documentation

## 2022-09-10 HISTORY — DX: Alcohol use, unspecified with alcohol-induced persisting amnestic disorder: F10.96

## 2022-09-10 LAB — URINE DRUG SCREEN, QUALITATIVE (ARMC ONLY)
Amphetamines, Ur Screen: NOT DETECTED
Barbiturates, Ur Screen: NOT DETECTED
Benzodiazepine, Ur Scrn: NOT DETECTED
Cannabinoid 50 Ng, Ur ~~LOC~~: NOT DETECTED
Cocaine Metabolite,Ur ~~LOC~~: NOT DETECTED
MDMA (Ecstasy)Ur Screen: NOT DETECTED
Methadone Scn, Ur: NOT DETECTED
Opiate, Ur Screen: NOT DETECTED
Phencyclidine (PCP) Ur S: NOT DETECTED
Tricyclic, Ur Screen: NOT DETECTED

## 2022-09-10 LAB — COMPREHENSIVE METABOLIC PANEL
ALT: 11 U/L (ref 0–44)
AST: 18 U/L (ref 15–41)
Albumin: 4.6 g/dL (ref 3.5–5.0)
Alkaline Phosphatase: 46 U/L (ref 38–126)
Anion gap: 10 (ref 5–15)
BUN: 67 mg/dL — ABNORMAL HIGH (ref 8–23)
CO2: 21 mmol/L — ABNORMAL LOW (ref 22–32)
Calcium: 10.2 mg/dL (ref 8.9–10.3)
Chloride: 106 mmol/L (ref 98–111)
Creatinine, Ser: 2.33 mg/dL — ABNORMAL HIGH (ref 0.44–1.00)
GFR, Estimated: 22 mL/min — ABNORMAL LOW (ref >60)
Glucose, Bld: 160 mg/dL — ABNORMAL HIGH (ref 70–99)
Potassium: 4.5 mmol/L (ref 3.5–5.1)
Sodium: 137 mmol/L (ref 135–145)
Total Bilirubin: 1 mg/dL (ref 0.3–1.2)
Total Protein: 8.6 g/dL — ABNORMAL HIGH (ref 6.5–8.1)

## 2022-09-10 LAB — URINALYSIS, ROUTINE W REFLEX MICROSCOPIC
Bilirubin Urine: NEGATIVE
Glucose, UA: NEGATIVE mg/dL
Hgb urine dipstick: NEGATIVE
Ketones, ur: NEGATIVE mg/dL
Nitrite: NEGATIVE
Protein, ur: NEGATIVE mg/dL
Specific Gravity, Urine: 1.006 (ref 1.005–1.030)
pH: 5 (ref 5.0–8.0)

## 2022-09-10 LAB — CBC WITH DIFFERENTIAL/PLATELET
Abs Immature Granulocytes: 0.02 10*3/uL (ref 0.00–0.07)
Basophils Absolute: 0.1 10*3/uL (ref 0.0–0.1)
Basophils Relative: 1 %
Eosinophils Absolute: 0.2 10*3/uL (ref 0.0–0.5)
Eosinophils Relative: 3 %
HCT: 32.6 % — ABNORMAL LOW (ref 36.0–46.0)
Hemoglobin: 10.5 g/dL — ABNORMAL LOW (ref 12.0–15.0)
Immature Granulocytes: 0 %
Lymphocytes Relative: 26 %
Lymphs Abs: 2.1 10*3/uL (ref 0.7–4.0)
MCH: 28.9 pg (ref 26.0–34.0)
MCHC: 32.2 g/dL (ref 30.0–36.0)
MCV: 89.8 fL (ref 80.0–100.0)
Monocytes Absolute: 0.8 10*3/uL (ref 0.1–1.0)
Monocytes Relative: 10 %
Neutro Abs: 4.9 10*3/uL (ref 1.7–7.7)
Neutrophils Relative %: 60 %
Platelets: 382 10*3/uL (ref 150–400)
RBC: 3.63 MIL/uL — ABNORMAL LOW (ref 3.87–5.11)
RDW: 12.2 % (ref 11.5–15.5)
WBC: 8.1 10*3/uL (ref 4.0–10.5)
nRBC: 0 % (ref 0.0–0.2)

## 2022-09-10 LAB — ETHANOL: Alcohol, Ethyl (B): <10 mg/dL (ref <10)

## 2022-09-10 LAB — LIPASE, BLOOD: Lipase: 77 U/L — ABNORMAL HIGH (ref 11–51)

## 2022-09-10 MED ORDER — CEPHALEXIN 500 MG PO CAPS: 500.0000 mg | ORAL_CAPSULE | Freq: Two times a day (BID) | ORAL | 0 refills | Status: AC

## 2022-09-10 MED ORDER — DIAZEPAM 5 MG PO TABS: 5.0000 mg | ORAL_TABLET | Freq: Two times a day (BID) | ORAL | 0 refills | Status: DC | PRN

## 2022-09-10 MED ORDER — SODIUM CHLORIDE 0.9 % IV BOLUS
1000.0000 mL | Freq: Once | INTRAVENOUS | Status: AC
  Administered 2022-09-10: 1000 mL via INTRAVENOUS

## 2022-09-10 MED ORDER — SODIUM CHLORIDE 0.9 % IV SOLN
1.0000 g | Freq: Once | INTRAVENOUS | Status: AC
  Administered 2022-09-10: 1 g via INTRAVENOUS
  Filled 2022-09-10: qty 10

## 2022-09-10 NOTE — ED Provider Notes (Signed)
[facility] Provider Note    Event Date/Time   First MD Initiated Contact with Patient 09/10/22 1809     (approximate)   History   Altered Mental Status   HPI  Laurie Lin is a 67 y.o. female with a history of diabetes, alcohol abuse, and Wernicke Korsakoff syndrome, hypertension, and GERD who presents with increased altered mental status and generalized weakness over the last several days.  The patient's husband and daughter state that she is somewhat confused at baseline ever since prolonged hospitalization earlier this year, and has had similar episodes of increased confusion with UTIs in the past most recently last December.  The patient herself denies any acute pain.  She has no fever or chills.  She states that she does feel slightly weaker than normal.  I reviewed the past medical records.  The patient was most recently seen in the ED with hypoxia on 2/19.  Her most recent outpatient encounter was with primary care on 5/3 for follow-up of her chronic conditions.   Physical Exam   Triage Vital Signs: ED Triage Vitals [09/10/22 1527]  Enc Vitals Group     BP (!) 142/65     Pulse Rate 78     Resp 18     Temp (!) 97.5 F (36.4 C)     Temp Source Oral     SpO2 100 %     Weight      Height      Head Circumference      Peak Flow      Pain Score 0     Pain Loc      Pain Edu?      Excl. in GC?     Most recent vital signs: Vitals:   09/10/22 1830 09/10/22 1937  BP: (!) 162/80 (!) 189/82  Pulse: 79 90  Resp: 19 18  Temp:    SpO2: 100% 100%     General: Alert, oriented x 2, no distress.  CV:  Good peripheral perfusion.  Resp:  Normal effort.  Abd:  Soft and nontender.  No distention.  Other:  Somewhat dry mucous membranes.  Motor intact in all extremities.   ED Results / Procedures / Treatments   Labs (all labs ordered are listed, but only abnormal results are displayed) Labs Reviewed  CBC WITH DIFFERENTIAL/PLATELET - Abnormal;  Notable for the following components:      Result Value   RBC 3.63 (*)    Hemoglobin 10.5 (*)    HCT 32.6 (*)    All other components within normal limits  COMPREHENSIVE METABOLIC PANEL - Abnormal; Notable for the following components:   CO2 21 (*)    Glucose, Bld 160 (*)    BUN 67 (*)    Creatinine, Ser 2.33 (*)    Total Protein 8.6 (*)    GFR, Estimated 22 (*)    All other components within normal limits  LIPASE, BLOOD - Abnormal; Notable for the following components:   Lipase 77 (*)    All other components within normal limits  URINALYSIS, ROUTINE W REFLEX MICROSCOPIC - Abnormal; Notable for the following components:   Color, Urine STRAW (*)    APPearance CLEAR (*)    Leukocytes,Ua MODERATE (*)    Bacteria, UA RARE (*)    All other components within normal limits  ETHANOL  URINE DRUG SCREEN, QUALITATIVE ([facility] ONLY)     EKG  ED ECG REPORT I, Dionne Bucy, the attending physician, personally  viewed and interpreted this ECG.  Date: 09/10/2022 EKG Time: 1538 Rate: 76 Rhythm: normal sinus rhythm QRS Axis: normal Intervals: normal ST/T Wave abnormalities: normal Narrative Interpretation: no evidence of acute ischemia    RADIOLOGY  CT head: I independently viewed and interpreted the images; there is no ICH or other acute abnormality   PROCEDURES:  Critical Care performed: No  Procedures   MEDICATIONS ORDERED IN ED: Medications  sodium chloride 0.9 % bolus 1,000 mL (0 mLs Intravenous Stopped 09/10/22 1905)  cefTRIAXone (ROCEPHIN) 1 g in sodium chloride 0.9 % 100 mL IVPB (0 g Intravenous Stopped 09/10/22 1933)     IMPRESSION / MDM / ASSESSMENT AND PLAN / ED COURSE  I reviewed the triage vital signs and the nursing notes.  67 year old female with PMH as noted above presents with increased altered mental status compared to baseline and concern for possible UTI.  On exam she is overall well-appearing.  Vital signs are normal.  Neurologic exam is  nonfocal.  Differential diagnosis includes, but is not limited to, UTI, dehydration, electrolyte abnormality, AKI, other metabolic disturbance, medication side effects, progression of cognitive impairment.  CT head is negative for acute findings.  CMP shows mild AKI.  Urinalysis shows findings compatible with a UTI.  Lipase is minimally elevated but not consistent with clinically significant pancreatitis.  We will give fluids, ceftriaxone, and reassess.  Patient's presentation is most consistent with acute complicated illness / injury requiring diagnostic workup.  The patient is on the cardiac monitor to evaluate for evidence of arrhythmia and/or significant heart rate changes.  ----------------------------------------- 8:31 PM on 09/10/2022 -----------------------------------------  The patient has received fluids and ceftriaxone.  I considered whether she may benefit from inpatient admission given the mild AKI and discussed this with the patient and family.  The patient has a strong preference to go home if at all possible.  The family also agrees with this.  They feel that she is close to her baseline and feel comfortable taking care of her at home.  They also agreed that they would prefer to take her home to continue treatment for UTI there.  I have prescribed Keflex.  The family also reported that the patient sometimes becomes agitated especially at night.  She is seeing a neurologist soon.  Therefore I think it is reasonable to give a small quantity of low-dose Valium to use temporarily for acute anxiety when required.  I counseled the patient and family members on room the results of the workup and the plan of care.  They agree to follow-up with her primary care provider and the neurologist.  I gave strict return precautions and they expressed understanding.  FINAL CLINICAL IMPRESSION(S) / ED DIAGNOSES   Final diagnoses:  Urinary tract infection without hematuria, site unspecified      Rx / DC Orders   ED Discharge Orders          Ordered    cephALEXin (KEFLEX) 500 MG capsule  2 times daily        09/10/22 2028    diazepam (VALIUM) 5 MG tablet  Every 12 hours PRN        09/10/22 2029             Note:  This document was prepared using Dragon voice recognition software and may include unintentional dictation errors.    Dionne Bucy, MD 09/10/22 2252

## 2022-09-10 NOTE — Discharge Instructions (Addendum)
Urinalysis shows findings of UTI.  The creatinine which is a measure of the kidney function is also slightly elevated, likely due to dehydration.  This should be rechecked when following up with primary care.  Laurie Lin should take the antibiotic as prescribed and finish the full 7-day course.  The Valium has been prescribed for acute anxiety/agitation but should only be used if absolutely necessary.  6 tablets have been prescribed for any episodes until the patient can follow-up with her doctor.  This is not a long-term treatment.  Return to the ER for new, worsening, or persistent severe confusion, weakness, change in mental status, fevers, vomiting, or any other new or worsening symptoms that are concerning.

## 2022-09-10 NOTE — ED Triage Notes (Signed)
Patient to ED via POV with family for increased confusion since last PM. HX of Wernicke-Korsakoff and diabetes. Patient did episode of lower blood sugar yesterday.  Hx of UTI. Patient stating people have been impersonating her husband.

## 2022-09-10 NOTE — ED Provider Triage Note (Signed)
Emergency Medicine Provider Triage Evaluation Note  Laurie Lin , a 67 y.o. female  was evaluated in triage.  Pt complains of AMS that began today. Daughter reports that she has a history of Wernicke-Korsakoff syndrome and UTIs. Patient denies pain. Also has a history of type 1 diabetes. Unknown if patient has been taking her meds according to daughter. Patient reports that there are people saying that they are her husband and trying to give her insulin shots.  Review of Systems  Positive: Confusion/AMS Negative: pain  Physical Exam  LMP 09/09/2004 (Approximate)  Gen:   Awake, no distress   Resp:  Normal effort  MSK:   Moves extremities without difficulty  Other:    Medical Decision Making  Medically screening exam initiated at 3:26 PM.  Appropriate orders placed.  Laurie Lin was informed that the remainder of the evaluation will be completed by another provider, this initial triage assessment does not replace that evaluation, and the importance of remaining in the ED until their evaluation is complete.     Jackelyn Hoehn, PA-C 09/10/22 1530

## 2022-09-11 ENCOUNTER — Telehealth: Payer: Self-pay

## 2022-09-11 NOTE — Transitions of Care (Post Inpatient/ED Visit) (Cosign Needed)
I spoke with pt and pts husband was outside and not available now; pt said she was told she has UTI and pt just picked up abx today; pt said she needs to discuss with her husband before scheduling an appt with Vallery Ridge NP. pt has contact info for [facility] and will call to schedule appt. Pt said she is feeling better. UC & ED precautions given and pt voiced understanding and appreciated call. Sending note to Vallery Ridge NP.    09/11/2022  Name: Laurie Lin MRN: [id number] DOB: May 19, 1955  Today's TOC FU Call Status: Today's TOC FU Call Status:: Successful TOC FU Call Competed Unsuccessful Call (1st Attempt) Date: 09/11/22 [facility] FU Call Complete Date: 09/11/22  Transition Care Management Follow-up Telephone Call Date of Discharge: 09/10/22 Discharge Facility: [facility]) Type of Discharge: Emergency Department Reason for ED Visit: Other: (pt said she was told she has UTI and pt just picked up abx today; pt said she needs to discuss with her husband before scheduling an appt with Vallery Ridge NP. pt has contact info for [facility] and will call to schedule appt.) How have you been since you were released from the hospital?: Better Any questions or concerns?: No  Items Reviewed: Did you receive and understand the discharge instructions provided?: Yes Medications obtained,verified, and reconciled?: Yes (Medications Reviewed) (pt just picked up the Cephalexin 500 mg at pharmacy.) Any new allergies since your discharge?: No Dietary orders reviewed?: NA Do you have support at home?: Yes People in Home: spouse Name of Support/Comfort Primary Source: Laurie Lin  Medications Reviewed Today: Medications Reviewed Today     Reviewed by Kara Dies, NP (Nurse Practitioner) on 09/08/22 at 0201  Med List Status: <None>   Medication Order Taking? Sig Documenting Provider Last Dose Status Informant  acetaminophen (TYLENOL) 500 MG tablet [id number] Yes Place 1 tablet (500 mg  total) into feeding tube 2 (two) times daily as needed for mild pain, fever or headache.  Patient taking differently: Take 500 mg by mouth 2 (two) times daily as needed for mild pain, fever or headache.   Alford Highland, MD Taking Active   calcium carbonate (TUMS EX) 750 MG chewable tablet [id number] Yes Chew 1 tablet by mouth as needed for heartburn. As needed [provider]  Active   cholecalciferol (CHOLECALCIFEROL) 25 MCG tablet [id number] Yes Place 2 tablets (2,000 Units total) into feeding tube daily.  Patient taking differently: Take 2,000 Units by mouth daily.   Darlin Priestly, MD Taking Active   Continuous Glucose Sensor (DEXCOM G7 SENSOR) [place] [id number] Yes 1 Application by Does not apply route every 14 (fourteen) days. [provider] Taking Active   cyanocobalamin 1000 MCG tablet [id number] Yes Place 1 tablet (1,000 mcg total) into feeding tube daily.  Patient taking differently: Take 1,000 mcg by mouth daily.   Arnetha Courser, MD Taking Active Other, Pharmacy Records  folic acid (FOLVITE) 1 MG tablet [id number] Yes Place 1 tablet (1 mg total) into feeding tube daily.  Patient taking differently: Take 1 mg by mouth daily.   Arnetha Courser, MD Taking Active Other, Pharmacy Records  Insulin Glargine [facility]) 100 UNIT/ML [id number] Yes Inject 8 Units into the skin at bedtime. Alford Highland, MD Taking Active   insulin lispro (HUMALOG KWIKPEN) 100 UNIT/ML KwikPen [id number]  INJECT AS PER SLIDING SCALE IF 0-150=0 LESS THAN 60 NOTIFY NP/MD 151-200=2UNITS,201-250=3 UNITS,251-300=5 UNITS,301-350=7 UNITS,351-400=9 UNITS GREATER THAN 401 CALL NP/MD Kara Dies, NP  Active   insulin lispro (  HUMALOG) 100 UNIT/ML injection [id number] Yes Inject 0-9 Units into the skin 3 (three) times daily before meals. Sliding scale per Endocrinology [provider] Taking Active   Insulin Pen Needle 32G X 4 MM MISC [id number] Yes ok to sub any brand or size needle preferred by  insurance/patient, use up to 5x/day, dx E10.65 [provider] Taking Active Other, Pharmacy Records  lisinopril (ZESTRIL) 2.5 MG tablet [id number] Yes Take 2.5 mg by mouth daily. [provider] Taking Active   Multiple Vitamin (MULTIVITAMIN WITH MINERALS) TABS tablet [id number] Yes Place 1 tablet into feeding tube daily.  Patient taking differently: Take 1 tablet by mouth daily.   Darlin Priestly, MD Taking Active   Netarsudil Dimesylate (RHOPRESSA) 0.02 % SOLN [id number] Yes Place 1 drop into both eyes every evening. [provider] Taking Active Other, Pharmacy Records  [facility] LANCETS 33G [place] [id number] Yes 1 Device by Does not apply route 4 (four) times daily. Galen Manila, NP Taking Active Other, Pharmacy Records  sertraline (ZOLOFT) 25 MG tablet [id number] Yes Take 1 tablet (25 mg total) by mouth daily. Kara Dies, NP Taking Active   thiamine (VITAMIN B1) 100 MG tablet [id number] Yes Place 1 tablet (100 mg total) into feeding tube daily.  Patient taking differently: Take 100 mg by mouth daily.   Arnetha Courser, MD Taking Active Other, Pharmacy Records  torsemide [facility]) 20 MG tablet [id number] Yes Take 20 mg by mouth daily. As needed [provider] Taking Active   Travoprost, BAK Free, (TRAVATAN) 0.004 % SOLN ophthalmic solution [id number] Yes Place 1 drop into both eyes at bedtime. [provider] Taking Active Other, Pharmacy Records  Med List Note Sharia Reeve, CPhT 05/06/22 2103): Discharged today from [facility] 05/06/22            Home Care and Equipment/Supplies: Were Home Health Services Ordered?: NA Any new equipment or medical supplies ordered?: NA  Functional Questionnaire: Do you need assistance with bathing/showering or dressing?: No Do you need assistance with meal preparation?: No Do you need assistance with eating?: No Do you have difficulty maintaining continence: No Do you need assistance with getting  out of bed/getting out of a chair/moving?: No Do you have difficulty managing or taking your medications?: No  Follow up appointments reviewed: PCP Follow-up appointment confirmed?: No (pt said she was told she has UTI and pt just picked up abx today; pt said she needs to discuss with her husband before scheduling an appt with Vallery Ridge NP. pt has contact info for [facility] and will call to schedule appt.) MD Provider Line Number:[phone] Given: Yes Specialist Hospital Follow-up appointment confirmed?: NA Do you need transportation to your follow-up appointment?: No Do you understand care options if your condition(s) worsen?: Yes-patient verbalized understanding    SIGNATURE Lewanda Rife, LPN

## 2022-09-11 NOTE — Telephone Encounter (Signed)
Noted! Thank you

## 2022-09-11 NOTE — Transitions of Care (Post Inpatient/ED Visit) (Signed)
Unable to reach pt at either contact # and left v/m for pt to call [phone].       09/11/2022  Name: Laurie Lin MRN: [id number] DOB: December 13, 1955  Today's TOC FU Call Status: Today's TOC FU Call Status:: Unsuccessul Call (1st Attempt) Unsuccessful Call (1st Attempt) Date: 09/11/22  Attempted to reach the patient regarding the most recent Inpatient/ED visit.  Follow Up Plan: Additional outreach attempts will be made to reach the patient to complete the Transitions of Care (Post Inpatient/ED visit) call.   Signature Lewanda Rife, LPN

## 2022-09-11 NOTE — Telephone Encounter (Signed)
Patient husband called in returning call they received. Thank you!

## 2022-09-25 ENCOUNTER — Ambulatory Visit (INDEPENDENT_AMBULATORY_CARE_PROVIDER_SITE_OTHER): Payer: Medicare Other | Admitting: Nurse Practitioner

## 2022-09-25 ENCOUNTER — Encounter: Payer: Self-pay | Admitting: Nurse Practitioner

## 2022-09-25 VITALS — BP 128/82 | HR 72 | Temp 97.6°F | Ht 70.0 in | Wt 169.4 lb

## 2022-09-25 DIAGNOSIS — R059 Cough, unspecified: Secondary | ICD-10-CM | POA: Insufficient documentation

## 2022-09-25 DIAGNOSIS — R058 Other specified cough: Secondary | ICD-10-CM | POA: Diagnosis not present

## 2022-09-25 MED ORDER — BENZONATATE 200 MG PO CAPS: 200.0000 mg | ORAL_CAPSULE | Freq: Two times a day (BID) | ORAL | 0 refills | Status: DC | PRN

## 2022-09-25 MED ORDER — AMOXICILLIN-POT CLAVULANATE 875-125 MG PO TABS: 1.0000 | ORAL_TABLET | Freq: Two times a day (BID) | ORAL | 0 refills | Status: AC

## 2022-09-25 NOTE — Patient Instructions (Signed)
RX sent to the pharmacy. Increase fluid in take. Use OTC Voltaren gel (diclofenac sodium) for back pain.

## 2022-09-25 NOTE — Progress Notes (Signed)
Established Patient Office Visit  Subjective:  Patient ID: Laurie Lin, female    DOB: 1955/12/03  Age: 67 y.o. MRN: [id number]  CC:  Chief Complaint  Patient presents with   Cough    1 week in mainly at night can not sleep    HPI  [name] presents for:  Cough This is a new problem. The current episode started in the past 7 days. The problem has been unchanged. The cough is Productive of sputum. Associated symptoms include nasal congestion. Pertinent negatives include no chest pain, chills, ear pain or rhinorrhea. Nothing aggravates the symptoms. She has tried OTC cough suppressant (cough drops.) for the symptoms. The treatment provided no relief.     Past Medical History:  Diagnosis Date   Alcohol abuse    Diabetes mellitus without complication (HCC)    GERD (gastroesophageal reflux disease)    Hypertension    Renal disorder    stage 3    Wernicke-Korsakoff syndrome (alcoholic) (HCC)     Past Surgical History:  Procedure Laterality Date   DILATION AND CURETTAGE OF UTERUS     Miscarriage   ESOPHAGOGASTRODUODENOSCOPY (EGD) WITH PROPOFOL N/A 05/22/2022   Procedure: ESOPHAGOGASTRODUODENOSCOPY (EGD) WITH PROPOFOL;  Surgeon: Toledo, Boykin Nearing, MD;  Location: [facility] ENDOSCOPY;  Service: Gastroenterology;  Laterality: N/A;   EYE SURGERY     bilateral caterac   FINGER SURGERY     IR GASTROSTOMY TUBE MOD SED  04/29/2022   IR RADIOLOGIST EVAL & MGMT  08/01/2022   IR REPLC GASTRO/COLONIC TUBE PERCUT W/FLUORO  06/04/2022   KNEE ARTHROSCOPY Left 1989   [facility]   PILONIDAL CYST DRAINAGE      Family History  Problem Relation Age of Onset   Depression Mother    Diabetes Mother     Social History   Socioeconomic History   Marital status: Married    Spouse name: Not on file   Number of children: Not on file   Years of education: Not on file   Highest education level: 12th grade  Occupational History   Not on file  Tobacco Use   Smoking status: Never   Smokeless  tobacco: Never  Vaping Use   Vaping Use: Never used  Substance and Sexual Activity   Alcohol use: Not on file    Comment: no   Drug use: No   Sexual activity: Yes  Other Topics Concern   Not on file  Social History Narrative   Not on file   Social Determinants of Health   Financial Resource Strain: Patient Declined (08/21/2022)   Overall Financial Resource Strain (CARDIA)    Difficulty of Paying Living Expenses: Patient declined  Food Insecurity: No Food Insecurity (08/21/2022)   Hunger Vital Sign    Worried About Running Out of Food in the Last Year: Never true    Ran Out of Food in the Last Year: Never true  Transportation Needs: No Transportation Needs (08/21/2022)   PRAPARE - Administrator, Civil Service (Medical): No    Lack of Transportation (Non-Medical): No  Physical Activity: Unknown (08/21/2022)   Exercise Vital Sign    Days of Exercise per Week: 0 days    Minutes of Exercise per Session: Not on file  Stress: Stress Concern Present (08/21/2022)   [employer] of Occupational Health - Occupational Stress Questionnaire    Feeling of Stress : Very much  Social Connections: Moderately Isolated (08/21/2022)   Social Connection and Isolation Panel [NHANES]  Frequency of Communication with Friends and Family: More than three times a week    Frequency of Social Gatherings with Friends and Family: Twice a week    Attends Religious Services: Never    Database administrator or Organizations: No    Attends Engineer, structural: Not on file    Marital Status: Married  Catering manager Violence: Not At Risk (05/09/2022)   Humiliation, Afraid, Rape, and Kick questionnaire    Fear of Current or Ex-Partner: No    Emotionally Abused: No    Physically Abused: No    Sexually Abused: No     Outpatient Medications Prior to Visit  Medication Sig Dispense Refill   acetaminophen (TYLENOL) 500 MG tablet Place 1 tablet (500 mg total) into feeding tube 2 (two) times  daily as needed for mild pain, fever or headache. (Patient taking differently: Take 500 mg by mouth 2 (two) times daily as needed for mild pain, fever or headache.) 30 tablet 0   calcium carbonate (TUMS EX) 750 MG chewable tablet Chew 1 tablet by mouth as needed for heartburn. As needed     cholecalciferol (CHOLECALCIFEROL) 25 MCG tablet Place 2 tablets (2,000 Units total) into feeding tube daily. (Patient taking differently: Take 2,000 Units by mouth daily.)     Continuous Glucose Sensor (DEXCOM G7 SENSOR) MISC 1 Application by Does not apply route every 14 (fourteen) days.     cyanocobalamin 1000 MCG tablet Place 1 tablet (1,000 mcg total) into feeding tube daily. (Patient taking differently: Take 1,000 mcg by mouth daily.)     diazepam (VALIUM) 5 MG tablet Take 1 tablet (5 mg total) by mouth every 12 (twelve) hours as needed for anxiety. 6 tablet 0   folic acid (FOLVITE) 1 MG tablet Place 1 tablet (1 mg total) into feeding tube daily. (Patient taking differently: Take 1 mg by mouth daily.)  3   Insulin Glargine (BASAGLAR KWIKPEN) 100 UNIT/ML Inject 8 Units into the skin at bedtime.     insulin lispro (HUMALOG KWIKPEN) 100 UNIT/ML KwikPen INJECT AS PER SLIDING SCALE IF 0-150=0 LESS THAN 60 NOTIFY NP/MD 151-200=2UNITS,201-250=3 UNITS,251-300=5 UNITS,301-350=7 UNITS,351-400=9 UNITS GREATER THAN 401 CALL NP/MD 15 mL 3   insulin lispro (HUMALOG) 100 UNIT/ML injection Inject 0-9 Units into the skin 3 (three) times daily before meals. Sliding scale per Endocrinology     Insulin Pen Needle 32G X 4 MM MISC ok to sub any brand or size needle preferred by insurance/patient, use up to 5x/day, dx E10.65     lisinopril (ZESTRIL) 2.5 MG tablet Take 2.5 mg by mouth daily.     Multiple Vitamin (MULTIVITAMIN WITH MINERALS) TABS tablet Place 1 tablet into feeding tube daily. (Patient taking differently: Take 1 tablet by mouth daily.)     Netarsudil Dimesylate (RHOPRESSA) 0.02 % SOLN Place 1 drop into both eyes every  evening.     ONETOUCH DELICA LANCETS 33G MISC 1 Device by Does not apply route 4 (four) times daily. 100 each 11   sertraline (ZOLOFT) 25 MG tablet Take 1 tablet (25 mg total) by mouth daily. 90 tablet 1   thiamine (VITAMIN B1) 100 MG tablet Place 1 tablet (100 mg total) into feeding tube daily. (Patient taking differently: Take 100 mg by mouth daily.)     torsemide (DEMADEX) 20 MG tablet Take 20 mg by mouth daily. As needed     Travoprost, BAK Free, (TRAVATAN) 0.004 % SOLN ophthalmic solution Place 1 drop into both eyes at bedtime.  No facility-administered medications prior to visit.    Allergies  Allergen Reactions   Ibuprofen Nausea Only   Nsaids     Ulcerative stomach and small intestines   Brimonidine Itching    Red, itchy, sensitivity to light Red, itchy, sensitivity to light    Latex Itching    Other reaction(s): Other (see comments)    ROS Review of Systems  Constitutional:  Negative for chills.  HENT:  Negative for ear pain and rhinorrhea.   Respiratory:  Positive for cough.   Cardiovascular:  Negative for chest pain.  Musculoskeletal:  Positive for back pain.  Hematological: Negative.    Negative unless indicated in HPI.    Objective:    Physical Exam Constitutional:      Appearance: Normal appearance. She is normal weight.  HENT:     Right Ear: Tympanic membrane normal. Tympanic membrane is not erythematous.     Left Ear: Tympanic membrane normal. Tympanic membrane is not erythematous.     Nose:     Right Turbinates: Not enlarged.     Left Turbinates: Not enlarged.     Right Sinus: No maxillary sinus tenderness or frontal sinus tenderness.     Left Sinus: No maxillary sinus tenderness or frontal sinus tenderness.     Mouth/Throat:     Mouth: Mucous membranes are moist.     Pharynx: No pharyngeal swelling, oropharyngeal exudate or posterior oropharyngeal erythema.     Tonsils: No tonsillar exudate.  Cardiovascular:     Rate and Rhythm: Normal rate  and regular rhythm.  Pulmonary:     Effort: Pulmonary effort is normal.     Breath sounds: Normal breath sounds. No stridor. No wheezing.  Neurological:     General: No focal deficit present.     Mental Status: She is alert and oriented to person, place, and time. Mental status is at baseline.  Psychiatric:        Mood and Affect: Mood normal.        Behavior: Behavior normal.        Thought Content: Thought content normal.        Judgment: Judgment normal.     BP 128/82   Pulse 72   Temp 97.6 F (36.4 C) (Oral)   Ht 5\' 10"  (1.778 m)   Wt 169 lb 6.4 oz (76.8 kg)   LMP 09/09/2004 (Approximate)   SpO2 97%   BMI 24.31 kg/m  Wt Readings from Last 3 Encounters:  10/03/22 168 lb 12.8 oz (76.6 kg)  09/25/22 169 lb 6.4 oz (76.8 kg)  08/22/22 170 lb 9.6 oz (77.4 kg)     Health Maintenance  Topic Date Due   Medicare Annual Wellness (AWV)  Never done   FOOT EXAM  Never done   OPHTHALMOLOGY EXAM  Never done   Diabetic kidney evaluation - Urine ACR  Never done   Hepatitis C Screening  Never done   Zoster Vaccines- Shingrix (1 of 2) Never done   Colonoscopy  Never done   MAMMOGRAM  Never done   Pneumonia Vaccine 43+ Years old (1 of 1 - PCV) Never done   DEXA SCAN  Never done   HEMOGLOBIN A1C  10/18/2022   INFLUENZA VACCINE  11/20/2022   Diabetic kidney evaluation - eGFR measurement  09/10/2023   DTaP/Tdap/Td (2 - Td or Tdap) 12/23/2028   HPV VACCINES  Aged Out   COVID-19 Vaccine  Discontinued    There are no preventive care reminders to display for this  patient.  Lab Results  Component Value Date   TSH 1.977 04/18/2022   Lab Results  Component Value Date   WBC 8.1 09/10/2022   HGB 10.5 (L) 09/10/2022   HCT 32.6 (L) 09/10/2022   MCV 89.8 09/10/2022   PLT 382 09/10/2022   Lab Results  Component Value Date   NA 137 09/10/2022   K 4.5 09/10/2022   CO2 21 (L) 09/10/2022   GLUCOSE 160 (H) 09/10/2022   BUN 67 (H) 09/10/2022   CREATININE 2.33 (H) 09/10/2022    BILITOT 1.0 09/10/2022   ALKPHOS 46 09/10/2022   AST 18 09/10/2022   ALT 11 09/10/2022   PROT 8.6 (H) 09/10/2022   ALBUMIN 4.6 09/10/2022   CALCIUM 10.2 09/10/2022   ANIONGAP 10 09/10/2022   No results found for: "CHOL" No results found for: "HDL" No results found for: "LDLCALC" No results found for: "TRIG" No results found for: "CHOLHDL" Lab Results  Component Value Date   HGBA1C 7.2 (H) 04/18/2022      Assessment & Plan:  Other cough Assessment & Plan: No adventitious sounds heard during auscultation. Will treat with an Augmentin and benzonatate Perles. Encouraged to increase fluid intake and use humidifier If symptoms not proving call the office for further evaluation    Other orders -     Benzonatate; Take 1 capsule (200 mg total) by mouth 2 (two) times daily as needed for cough.  Dispense: 20 capsule; Refill: 0 -     Amoxicillin-Pot Clavulanate; Take 1 tablet by mouth 2 (two) times daily for 7 days.  Dispense: 14 tablet; Refill: 0    Follow-up: No follow-ups on file.   Kara Dies, NP

## 2022-10-01 ENCOUNTER — Encounter: Payer: Self-pay | Admitting: Nurse Practitioner

## 2022-10-03 ENCOUNTER — Encounter: Payer: Self-pay | Admitting: Nurse Practitioner

## 2022-10-03 ENCOUNTER — Ambulatory Visit (INDEPENDENT_AMBULATORY_CARE_PROVIDER_SITE_OTHER): Payer: Medicare Other | Admitting: Nurse Practitioner

## 2022-10-03 VITALS — BP 130/80 | HR 70 | Temp 97.8°F | Ht 69.0 in | Wt 168.8 lb

## 2022-10-03 DIAGNOSIS — R399 Unspecified symptoms and signs involving the genitourinary system: Secondary | ICD-10-CM

## 2022-10-03 LAB — URINALYSIS, ROUTINE W REFLEX MICROSCOPIC
Bilirubin Urine: NEGATIVE
Hgb urine dipstick: NEGATIVE
Ketones, ur: NEGATIVE
Nitrite: NEGATIVE
RBC / HPF: NONE SEEN (ref 0–?)
Specific Gravity, Urine: 1.01 (ref 1.000–1.030)
Total Protein, Urine: NEGATIVE
Urine Glucose: NEGATIVE
Urobilinogen, UA: 0.2 (ref 0.0–1.0)
pH: 5.5 (ref 5.0–8.0)

## 2022-10-03 LAB — POCT URINALYSIS DIPSTICK
Bilirubin, UA: NEGATIVE
Blood, UA: NEGATIVE
Glucose, UA: NEGATIVE
Ketones, UA: NEGATIVE
Nitrite, UA: NEGATIVE
Protein, UA: NEGATIVE
Spec Grav, UA: 1.015 (ref 1.010–1.025)
Urobilinogen, UA: 0.2 E.U./dL
pH, UA: 5.5 (ref 5.0–8.0)

## 2022-10-03 NOTE — Progress Notes (Signed)
Established Patient Office Visit  Subjective:  Patient ID: Laurie Lin, female    DOB: 23-Mar-1956  Age: 67 y.o. MRN: [id number]  CC:  Chief Complaint  Patient presents with   Urinary Tract Infection    HPI  Laurie Lin presents for home UTI test positive. She denies any symptoms at present. Denise dysuria, frequency or urgency.   She has recently completed the course of Augmentin due to URI symptoms.  She had some Mental status changes had hallucaination.   HPI   Past Medical History:  Diagnosis Date   Alcohol abuse    Diabetes mellitus without complication (HCC)    GERD (gastroesophageal reflux disease)    Hypertension    Renal disorder    stage 3    Wernicke-Korsakoff syndrome (alcoholic) (HCC)     Past Surgical History:  Procedure Laterality Date   DILATION AND CURETTAGE OF UTERUS     Miscarriage   ESOPHAGOGASTRODUODENOSCOPY (EGD) WITH PROPOFOL N/A 05/22/2022   Procedure: ESOPHAGOGASTRODUODENOSCOPY (EGD) WITH PROPOFOL;  Surgeon: Toledo, Boykin Nearing, MD;  Location: [facility] ENDOSCOPY;  Service: Gastroenterology;  Laterality: N/A;   EYE SURGERY     bilateral caterac   FINGER SURGERY     IR GASTROSTOMY TUBE MOD SED  04/29/2022   IR RADIOLOGIST EVAL & MGMT  08/01/2022   IR REPLC GASTRO/COLONIC TUBE PERCUT W/FLUORO  06/04/2022   KNEE ARTHROSCOPY Left 1989   [facility]   PILONIDAL CYST DRAINAGE      Family History  Problem Relation Age of Onset   Depression Mother    Diabetes Mother     Social History   Socioeconomic History   Marital status: Married    Spouse name: Not on file   Number of children: Not on file   Years of education: Not on file   Highest education level: 12th grade  Occupational History   Not on file  Tobacco Use   Smoking status: Never   Smokeless tobacco: Never  Vaping Use   Vaping Use: Never used  Substance and Sexual Activity   Alcohol use: Not on file    Comment: no   Drug use: No   Sexual activity: Yes  Other Topics Concern   Not  on file  Social History Narrative   Not on file   Social Determinants of Health   Financial Resource Strain: Patient Declined (08/21/2022)   Overall Financial Resource Strain (CARDIA)    Difficulty of Paying Living Expenses: Patient declined  Food Insecurity: No Food Insecurity (08/21/2022)   Hunger Vital Sign    Worried About Running Out of Food in the Last Year: Never true    Ran Out of Food in the Last Year: Never true  Transportation Needs: No Transportation Needs (08/21/2022)   PRAPARE - Administrator, Civil Service (Medical): No    Lack of Transportation (Non-Medical): No  Physical Activity: Unknown (08/21/2022)   Exercise Vital Sign    Days of Exercise per Week: 0 days    Minutes of Exercise per Session: Not on file  Stress: Stress Concern Present (08/21/2022)   [employer] of Occupational Health - Occupational Stress Questionnaire    Feeling of Stress : Very much  Social Connections: Moderately Isolated (08/21/2022)   Social Connection and Isolation Panel [NHANES]    Frequency of Communication with Friends and Family: More than three times a week    Frequency of Social Gatherings with Friends and Family: Twice a week    Attends Religious Services:  Never    Active Member of Clubs or Organizations: No    Attends Banker Meetings: Not on file    Marital Status: Married  Intimate Partner Violence: Not At Risk (05/09/2022)   Humiliation, Afraid, Rape, and Kick questionnaire    Fear of Current or Ex-Partner: No    Emotionally Abused: No    Physically Abused: No    Sexually Abused: No     Outpatient Medications Prior to Visit  Medication Sig Dispense Refill   acetaminophen (TYLENOL) 500 MG tablet Place 1 tablet (500 mg total) into feeding tube 2 (two) times daily as needed for mild pain, fever or headache. (Patient taking differently: Take 500 mg by mouth 2 (two) times daily as needed for mild pain, fever or headache.) 30 tablet 0   benzonatate  (TESSALON) 200 MG capsule Take 1 capsule (200 mg total) by mouth 2 (two) times daily as needed for cough. 20 capsule 0   calcium carbonate (TUMS EX) 750 MG chewable tablet Chew 1 tablet by mouth as needed for heartburn. As needed     cholecalciferol (CHOLECALCIFEROL) 25 MCG tablet Place 2 tablets (2,000 Units total) into feeding tube daily. (Patient taking differently: Take 2,000 Units by mouth daily.)     Continuous Glucose Sensor (DEXCOM G7 SENSOR) MISC 1 Application by Does not apply route every 14 (fourteen) days.     cyanocobalamin 1000 MCG tablet Place 1 tablet (1,000 mcg total) into feeding tube daily. (Patient taking differently: Take 1,000 mcg by mouth daily.)     diazepam (VALIUM) 5 MG tablet Take 1 tablet (5 mg total) by mouth every 12 (twelve) hours as needed for anxiety. 6 tablet 0   folic acid (FOLVITE) 1 MG tablet Place 1 tablet (1 mg total) into feeding tube daily. (Patient taking differently: Take 1 mg by mouth daily.)  3   Insulin Glargine (BASAGLAR KWIKPEN) 100 UNIT/ML Inject 8 Units into the skin at bedtime.     insulin lispro (HUMALOG KWIKPEN) 100 UNIT/ML KwikPen INJECT AS PER SLIDING SCALE IF 0-150=0 LESS THAN 60 NOTIFY NP/MD 151-200=2UNITS,201-250=3 UNITS,251-300=5 UNITS,301-350=7 UNITS,351-400=9 UNITS GREATER THAN 401 CALL NP/MD 15 mL 3   insulin lispro (HUMALOG) 100 UNIT/ML injection Inject 0-9 Units into the skin 3 (three) times daily before meals. Sliding scale per Endocrinology     Insulin Pen Needle 32G X 4 MM MISC ok to sub any brand or size needle preferred by insurance/patient, use up to 5x/day, dx E10.65     lisinopril (ZESTRIL) 2.5 MG tablet Take 2.5 mg by mouth daily.     Multiple Vitamin (MULTIVITAMIN WITH MINERALS) TABS tablet Place 1 tablet into feeding tube daily. (Patient taking differently: Take 1 tablet by mouth daily.)     Netarsudil Dimesylate (RHOPRESSA) 0.02 % SOLN Place 1 drop into both eyes every evening.     ONETOUCH DELICA LANCETS 33G MISC 1 Device by  Does not apply route 4 (four) times daily. 100 each 11   sertraline (ZOLOFT) 25 MG tablet Take 1 tablet (25 mg total) by mouth daily. 90 tablet 1   thiamine (VITAMIN B1) 100 MG tablet Place 1 tablet (100 mg total) into feeding tube daily. (Patient taking differently: Take 100 mg by mouth daily.)     torsemide (DEMADEX) 20 MG tablet Take 20 mg by mouth daily. As needed     Travoprost, BAK Free, (TRAVATAN) 0.004 % SOLN ophthalmic solution Place 1 drop into both eyes at bedtime.     No facility-administered medications prior to  visit.    Allergies  Allergen Reactions   Ibuprofen Nausea Only   Nsaids     Ulcerative stomach and small intestines   Brimonidine Itching    Red, itchy, sensitivity to light Red, itchy, sensitivity to light    Latex Itching    Other reaction(s): Other (see comments)    ROS Review of Systems Negative unless indicated in HPI.    Objective:    Physical Exam Constitutional:      Appearance: Normal appearance.  Cardiovascular:     Rate and Rhythm: Normal rate and regular rhythm.     Pulses: Normal pulses.     Heart sounds: Normal heart sounds.  Abdominal:     General: Bowel sounds are normal.     Palpations: Abdomen is soft.     Tenderness: There is no abdominal tenderness. There is no right CVA tenderness or left CVA tenderness.  Musculoskeletal:     Cervical back: Normal range of motion.  Neurological:     General: No focal deficit present.     Mental Status: She is alert. Mental status is at baseline.  Psychiatric:        Mood and Affect: Mood normal.        Behavior: Behavior normal.        Thought Content: Thought content normal.        Judgment: Judgment normal.     BP 130/80   Pulse 70   Temp 97.8 F (36.6 C) (Oral)   Ht 5\' 9"  (1.753 m)   Wt 168 lb 12.8 oz (76.6 kg)   LMP 09/09/2004 (Approximate)   SpO2 97%   BMI 24.93 kg/m  Wt Readings from Last 3 Encounters:  10/03/22 168 lb 12.8 oz (76.6 kg)  09/25/22 169 lb 6.4 oz (76.8 kg)   08/22/22 170 lb 9.6 oz (77.4 kg)     Health Maintenance  Topic Date Due   Medicare Annual Wellness (AWV)  Never done   FOOT EXAM  Never done   OPHTHALMOLOGY EXAM  Never done   Diabetic kidney evaluation - Urine ACR  Never done   Hepatitis C Screening  Never done   Zoster Vaccines- Shingrix (1 of 2) Never done   Colonoscopy  Never done   MAMMOGRAM  Never done   Pneumonia Vaccine 73+ Years old (1 of 1 - PCV) Never done   DEXA SCAN  Never done   HEMOGLOBIN A1C  10/18/2022   INFLUENZA VACCINE  11/20/2022   Diabetic kidney evaluation - eGFR measurement  09/10/2023   DTaP/Tdap/Td (2 - Td or Tdap) 12/23/2028   HPV VACCINES  Aged Out   COVID-19 Vaccine  Discontinued    There are no preventive care reminders to display for this patient.  Lab Results  Component Value Date   TSH 1.977 04/18/2022   Lab Results  Component Value Date   WBC 8.1 09/10/2022   HGB 10.5 (L) 09/10/2022   HCT 32.6 (L) 09/10/2022   MCV 89.8 09/10/2022   PLT 382 09/10/2022   Lab Results  Component Value Date   NA 137 09/10/2022   K 4.5 09/10/2022   CO2 21 (L) 09/10/2022   GLUCOSE 160 (H) 09/10/2022   BUN 67 (H) 09/10/2022   CREATININE 2.33 (H) 09/10/2022   BILITOT 1.0 09/10/2022   ALKPHOS 46 09/10/2022   AST 18 09/10/2022   ALT 11 09/10/2022   PROT 8.6 (H) 09/10/2022   ALBUMIN 4.6 09/10/2022   CALCIUM 10.2 09/10/2022   ANIONGAP 10 09/10/2022  No results found for: "CHOL" No results found for: "HDL" No results found for: "LDLCALC" No results found for: "TRIG" No results found for: "CHOLHDL" Lab Results  Component Value Date   HGBA1C 7.2 (H) 04/18/2022      Assessment & Plan:  UTI symptoms Assessment & Plan: POCT urinalysis positive for trace leukocyte. Urine microscopy and culture pending. Advised to increase fluid intake.   Orders: -     POCT urinalysis dipstick -     Urinalysis, Routine w reflex microscopic -     Urine Culture    Follow-up: No follow-ups on file.    Kara Dies, NP

## 2022-10-04 LAB — URINE CULTURE
MICRO NUMBER:: 15084553
SPECIMEN QUALITY:: ADEQUATE

## 2022-10-06 NOTE — Assessment & Plan Note (Signed)
No adventitious sounds heard during auscultation. Will treat with an Augmentin and benzonatate Perles. Encouraged to increase fluid intake and use humidifier If symptoms not proving call the office for further evaluation

## 2022-10-10 LAB — HEMOGLOBIN A1C: Hemoglobin A1C: 10.1

## 2022-10-10 LAB — COMPREHENSIVE METABOLIC PANEL: eGFR: 21

## 2022-10-12 DIAGNOSIS — R399 Unspecified symptoms and signs involving the genitourinary system: Secondary | ICD-10-CM | POA: Insufficient documentation

## 2022-10-12 NOTE — Assessment & Plan Note (Signed)
POCT urinalysis positive for trace leukocyte. Urine microscopy and culture pending. Advised to increase fluid intake.

## 2022-11-19 ENCOUNTER — Encounter: Payer: Self-pay | Admitting: Nurse Practitioner

## 2022-12-25 ENCOUNTER — Other Ambulatory Visit: Payer: Self-pay | Admitting: Nurse Practitioner

## 2023-01-02 ENCOUNTER — Ambulatory Visit: Payer: Medicare Other | Admitting: Nurse Practitioner

## 2023-01-09 ENCOUNTER — Ambulatory Visit (INDEPENDENT_AMBULATORY_CARE_PROVIDER_SITE_OTHER): Payer: Medicare Other | Admitting: Nurse Practitioner

## 2023-01-09 ENCOUNTER — Encounter: Payer: Self-pay | Admitting: Nurse Practitioner

## 2023-01-09 ENCOUNTER — Telehealth: Payer: Self-pay | Admitting: Nurse Practitioner

## 2023-01-09 VITALS — BP 128/64 | HR 76 | Temp 98.3°F | Ht 69.0 in | Wt 175.6 lb

## 2023-01-09 DIAGNOSIS — N3001 Acute cystitis with hematuria: Secondary | ICD-10-CM

## 2023-01-09 DIAGNOSIS — Z111 Encounter for screening for respiratory tuberculosis: Secondary | ICD-10-CM

## 2023-01-09 DIAGNOSIS — R443 Hallucinations, unspecified: Secondary | ICD-10-CM

## 2023-01-09 DIAGNOSIS — R410 Disorientation, unspecified: Secondary | ICD-10-CM

## 2023-01-09 LAB — POCT URINALYSIS DIPSTICK
Bilirubin, UA: NEGATIVE
Glucose, UA: NEGATIVE
Ketones, UA: NEGATIVE
Nitrite, UA: POSITIVE
Protein, UA: NEGATIVE
Spec Grav, UA: 1.02 (ref 1.010–1.025)
Urobilinogen, UA: 0.2 E.U./dL
pH, UA: 5.5 (ref 5.0–8.0)

## 2023-01-09 MED ORDER — CEPHALEXIN 500 MG PO CAPS: 500.0000 mg | ORAL_CAPSULE | Freq: Two times a day (BID) | ORAL | 0 refills | Status: DC

## 2023-01-09 NOTE — Telephone Encounter (Signed)
Patient dropped off document FL2, to be filled out by provider. Patient requested to send it via Call Patient to pick up within 5-days. Document is located in providers folder at front office.Please advise at Mobile [phone] (mobile)

## 2023-01-09 NOTE — Progress Notes (Signed)
Established Patient Office Visit  Subjective:  Patient ID: Laurie Lin, female    DOB: 1955-08-31  Age: 67 y.o. MRN: [id number]  CC:  Chief Complaint  Patient presents with   Medical Management of Chronic Issues    Discuss the need for placement to a memory care facility.     HPI  Laurie Lin presents for confusion and hallucination accompanied by her husband and daughter.  Laurie Lin lives with her husband and her daughter visits often. Family states that Laurie Lin has been experiencing increasing memory difficulties, hallucination and confusion since last few months.  They have noticed that she forgets  recent events, repeat questions and struggle with daily tasks.  She has reported seeing things that are not there like somebody else is living in her house other than his husband.  Family also reports that she required high level of care.  He reports that patient left also couple of times prompting neighbors to call family to inform them.  Given pt symptoms family would like to discuss option for placement to memory care  She denies any urinary symptoms, urgency frequency or dysuria at present. Family states that she often gets UTI and would like to get her tested due to her confusion.   HPI   Past Medical History:  Diagnosis Date   Alcohol abuse    Diabetes mellitus without complication (HCC)    GERD (gastroesophageal reflux disease)    Hypertension    Renal disorder    stage 3    Wernicke-Korsakoff syndrome (alcoholic) (HCC)     Past Surgical History:  Procedure Laterality Date   DILATION AND CURETTAGE OF UTERUS     Miscarriage   ESOPHAGOGASTRODUODENOSCOPY (EGD) WITH PROPOFOL N/A 05/22/2022   Procedure: ESOPHAGOGASTRODUODENOSCOPY (EGD) WITH PROPOFOL;  Surgeon: Toledo, Boykin Nearing, MD;  Location: [facility] ENDOSCOPY;  Service: Gastroenterology;  Laterality: N/A;   EYE SURGERY     bilateral caterac   FINGER SURGERY     IR GASTROSTOMY TUBE MOD SED  04/29/2022   IR  RADIOLOGIST EVAL & MGMT  08/01/2022   IR REPLC GASTRO/COLONIC TUBE PERCUT W/FLUORO  06/04/2022   KNEE ARTHROSCOPY Left 1989   [facility]   PILONIDAL CYST DRAINAGE      Family History  Problem Relation Age of Onset   Depression Mother    Diabetes Mother     Social History   Socioeconomic History   Marital status: Married    Spouse name: Not on file   Number of children: Not on file   Years of education: Not on file   Highest education level: 12th grade  Occupational History   Not on file  Tobacco Use   Smoking status: Never   Smokeless tobacco: Never  Vaping Use   Vaping status: Never Used  Substance and Sexual Activity   Alcohol use: Not on file    Comment: no   Drug use: No   Sexual activity: Yes  Other Topics Concern   Not on file  Social History Narrative   Not on file   Social Determinants of Health   Financial Resource Strain: Patient Declined (08/21/2022)   Overall Financial Resource Strain (CARDIA)    Difficulty of Paying Living Expenses: Patient declined  Food Insecurity: No Food Insecurity (08/21/2022)   Hunger Vital Sign    Worried About Running Out of Food in the Last Year: Never true    Ran Out of Food in the Last Year: Never true  Transportation Needs: No Transportation  Needs (08/21/2022)   PRAPARE - Administrator, Civil Service (Medical): No    Lack of Transportation (Non-Medical): No  Physical Activity: Unknown (08/21/2022)   Exercise Vital Sign    Days of Exercise per Week: 0 days    Minutes of Exercise per Session: Not on file  Stress: Stress Concern Present (08/21/2022)   [employer] of Occupational Health - Occupational Stress Questionnaire    Feeling of Stress : Very much  Social Connections: Moderately Isolated (08/21/2022)   Social Connection and Isolation Panel [NHANES]    Frequency of Communication with Friends and Family: More than three times a week    Frequency of Social Gatherings with Friends and Family: Twice a week    Attends  Religious Services: Never    Database administrator or Organizations: No    Attends Engineer, structural: Not on file    Marital Status: Married  Catering manager Violence: Not At Risk (05/09/2022)   Humiliation, Afraid, Rape, and Kick questionnaire    Fear of Current or Ex-Partner: No    Emotionally Abused: No    Physically Abused: No    Sexually Abused: No     Outpatient Medications Prior to Visit  Medication Sig Dispense Refill   acetaminophen (TYLENOL) 500 MG tablet Place 1 tablet (500 mg total) into feeding tube 2 (two) times daily as needed for mild pain, fever or headache. (Patient taking differently: Take 500 mg by mouth 2 (two) times daily as needed for mild pain, fever or headache.) 30 tablet 0   benzonatate (TESSALON) 200 MG capsule Take 1 capsule (200 mg total) by mouth 2 (two) times daily as needed for cough. 20 capsule 0   calcium carbonate (TUMS EX) 750 MG chewable tablet Chew 1 tablet by mouth as needed for heartburn. As needed     cholecalciferol (CHOLECALCIFEROL) 25 MCG tablet Place 2 tablets (2,000 Units total) into feeding tube daily. (Patient taking differently: Take 2,000 Units by mouth daily.)     Continuous Glucose Sensor (DEXCOM G7 SENSOR) MISC 1 Application by Does not apply route every 14 (fourteen) days.     cyanocobalamin 1000 MCG tablet Place 1 tablet (1,000 mcg total) into feeding tube daily. (Patient taking differently: Take 1,000 mcg by mouth daily.)     diazepam (VALIUM) 5 MG tablet Take 1 tablet (5 mg total) by mouth every 12 (twelve) hours as needed for anxiety. 6 tablet 0   folic acid (FOLVITE) 1 MG tablet Place 1 tablet (1 mg total) into feeding tube daily. (Patient taking differently: Take 1 mg by mouth daily.)  3   Insulin Glargine (BASAGLAR KWIKPEN) 100 UNIT/ML Inject 8 Units into the skin at bedtime.     insulin lispro (HUMALOG KWIKPEN) 100 UNIT/ML KwikPen INJECT AS PER SLIDING SCALE IF 0-150=0 LESS THAN 60 NOTIFY NP/MD  151-200=2UNITS,201-250=3 UNITS,251-300=5 UNITS,301-350=7 UNITS,351-400=9 UNITS GREATER THAN 401 CALL NP/MD 15 mL 3   insulin lispro (HUMALOG) 100 UNIT/ML injection Inject 0-9 Units into the skin 3 (three) times daily before meals. Sliding scale per Endocrinology     Insulin Pen Needle 32G X 4 MM MISC ok to sub any brand or size needle preferred by insurance/patient, use up to 5x/day, dx E10.65     lisinopril (ZESTRIL) 2.5 MG tablet Take 2.5 mg by mouth daily.     Multiple Vitamin (MULTIVITAMIN WITH MINERALS) TABS tablet Place 1 tablet into feeding tube daily. (Patient taking differently: Take 1 tablet by mouth daily.)  Netarsudil Dimesylate (RHOPRESSA) 0.02 % SOLN Place 1 drop into both eyes every evening.     ONETOUCH DELICA LANCETS 33G MISC 1 Device by Does not apply route 4 (four) times daily. 100 each 11   sertraline (ZOLOFT) 25 MG tablet TAKE 1 TABLET (25 MG TOTAL) BY MOUTH DAILY. 90 tablet 1   thiamine (VITAMIN B1) 100 MG tablet Place 1 tablet (100 mg total) into feeding tube daily. (Patient taking differently: Take 100 mg by mouth daily.)     torsemide (DEMADEX) 20 MG tablet Take 20 mg by mouth daily. As needed     Travoprost, BAK Free, (TRAVATAN) 0.004 % SOLN ophthalmic solution Place 1 drop into both eyes at bedtime.     No facility-administered medications prior to visit.    Allergies  Allergen Reactions   Ibuprofen Nausea Only   Nsaids     Ulcerative stomach and small intestines   Brimonidine Itching    Red, itchy, sensitivity to light Red, itchy, sensitivity to light    Latex Itching    Other reaction(s): Other (see comments)    ROS Review of Systems Negative unless indicated in HPI.    Objective:    Physical Exam Constitutional:      Appearance: Normal appearance.  Cardiovascular:     Rate and Rhythm: Normal rate and regular rhythm.     Pulses: Normal pulses.     Heart sounds: Normal heart sounds.  Pulmonary:     Effort: Pulmonary effort is normal.      Breath sounds: Normal breath sounds. No stridor. No wheezing.  Skin:    General: Skin is warm.  Neurological:     Mental Status: She is alert.     Motor: No weakness.  Psychiatric:        Mood and Affect: Mood normal.     BP 128/64   Pulse 76   Temp 98.3 F (36.8 C) (Oral)   Ht 5\' 9"  (1.753 m)   Wt 175 lb 9.6 oz (79.7 kg)   LMP 09/09/2004 (Approximate)   SpO2 98%   BMI 25.93 kg/m  Wt Readings from Last 3 Encounters:  01/09/23 175 lb 9.6 oz (79.7 kg)  10/03/22 168 lb 12.8 oz (76.6 kg)  09/25/22 169 lb 6.4 oz (76.8 kg)     Health Maintenance  Topic Date Due   Medicare Annual Wellness (AWV)  Never done   FOOT EXAM  Never done   OPHTHALMOLOGY EXAM  Never done   Diabetic kidney evaluation - Urine ACR  Never done   Hepatitis C Screening  Never done   Zoster Vaccines- Shingrix (1 of 2) Never done   Colonoscopy  Never done   MAMMOGRAM  Never done   Pneumonia Vaccine 45+ Years old (1 of 1 - PCV) Never done   DEXA SCAN  Never done   INFLUENZA VACCINE  07/20/2023 (Originally 11/20/2022)   HEMOGLOBIN A1C  04/11/2023   Diabetic kidney evaluation - eGFR measurement  10/10/2023   DTaP/Tdap/Td (2 - Td or Tdap) 12/23/2028   HPV VACCINES  Aged Out   COVID-19 Vaccine  Discontinued    There are no preventive care reminders to display for this patient.  Lab Results  Component Value Date   TSH 1.977 04/18/2022   Lab Results  Component Value Date   WBC 8.1 09/10/2022   HGB 10.5 (L) 09/10/2022   HCT 32.6 (L) 09/10/2022   MCV 89.8 09/10/2022   PLT 382 09/10/2022   Lab Results  Component Value Date  NA 137 09/10/2022   K 4.5 09/10/2022   CO2 21 (L) 09/10/2022   GLUCOSE 160 (H) 09/10/2022   BUN 67 (H) 09/10/2022   CREATININE 2.33 (H) 09/10/2022   BILITOT 1.0 09/10/2022   ALKPHOS 46 09/10/2022   AST 18 09/10/2022   ALT 11 09/10/2022   PROT 8.6 (H) 09/10/2022   ALBUMIN 4.6 09/10/2022   CALCIUM 10.2 09/10/2022   ANIONGAP 10 09/10/2022   EGFR 21 10/10/2022   No results  found for: "CHOL" No results found for: "HDL" No results found for: "LDLCALC" No results found for: "TRIG" No results found for: "CHOLHDL" Lab Results  Component Value Date   HGBA1C 10.1 10/10/2022      Assessment & Plan:  Confusion -     POCT urinalysis dipstick -     Urine Culture -     Urine Microscopic  Hallucination Assessment & Plan: Patient with significant cognitive not able to complete Mini-Mental. Discussed with the family options for memory care placement. Given patient symptoms she would be candidate for placement in memory care   Acute cystitis with hematuria Assessment & Plan: POCT urinalysis positive for large nitrite and hematuria. Will treat with cephalexin. Urine culture pending   Other orders -     Extra Urine Specimen    Follow-up: No follow-ups on file.   Kara Dies, NP

## 2023-01-11 LAB — URINALYSIS, MICROSCOPIC ONLY
Hyaline Cast: NONE SEEN /LPF
RBC / HPF: NONE SEEN /HPF (ref 0–2)
WBC, UA: >=60 /HPF — AB (ref 0–5)

## 2023-01-11 LAB — EXTRA URINE SPECIMEN

## 2023-01-12 ENCOUNTER — Other Ambulatory Visit: Payer: Self-pay | Admitting: Nurse Practitioner

## 2023-01-12 LAB — URINE CULTURE
MICRO NUMBER:: 15495442
SPECIMEN QUALITY:: ADEQUATE

## 2023-01-12 MED ORDER — NITROFURANTOIN MONOHYD MACRO 100 MG PO CAPS: 100.0000 mg | ORAL_CAPSULE | Freq: Two times a day (BID) | ORAL | 0 refills | Status: DC

## 2023-01-12 NOTE — Progress Notes (Signed)
Please inform the patient that the urine culture is positive for UTI and the antibiotic that was started is not effective against the specific bacteria.   Please stop taking cephalexin and start nitrofurantoin.  Medication sent to the pharmacy

## 2023-01-13 NOTE — Telephone Encounter (Signed)
I have completed and placed on your tray for signature.

## 2023-01-18 ENCOUNTER — Encounter: Payer: Self-pay | Admitting: Nurse Practitioner

## 2023-01-18 DIAGNOSIS — R443 Hallucinations, unspecified: Secondary | ICD-10-CM | POA: Insufficient documentation

## 2023-01-18 DIAGNOSIS — R410 Disorientation, unspecified: Secondary | ICD-10-CM | POA: Insufficient documentation

## 2023-01-18 DIAGNOSIS — N3001 Acute cystitis with hematuria: Secondary | ICD-10-CM | POA: Insufficient documentation

## 2023-01-18 NOTE — Assessment & Plan Note (Addendum)
Patient with significant cognitive not able to complete Mini-Mental. Discussed with the family options for memory care placement. Given patient symptoms she would be candidate for placement in memory care

## 2023-01-18 NOTE — Assessment & Plan Note (Signed)
POCT urinalysis positive for large nitrite and hematuria. Will treat with cephalexin. Urine culture pending

## 2023-01-19 ENCOUNTER — Encounter: Payer: Self-pay | Admitting: Nurse Practitioner

## 2023-01-19 NOTE — Telephone Encounter (Signed)
Pt's daughter request tb test

## 2023-01-22 NOTE — Telephone Encounter (Signed)
Patient's daughter, Lannette Donath, called to schedule lab visit for patient.  Patient has been scheduled for a lab visit on 01/26/2023.

## 2023-01-23 ENCOUNTER — Other Ambulatory Visit: Payer: Medicare Other

## 2023-01-23 NOTE — Telephone Encounter (Signed)
Per Evelene Croon paperwork was completed

## 2023-01-26 ENCOUNTER — Other Ambulatory Visit: Payer: Medicare Other

## 2023-01-26 ENCOUNTER — Ambulatory Visit (INDEPENDENT_AMBULATORY_CARE_PROVIDER_SITE_OTHER): Payer: Medicare Other

## 2023-01-26 DIAGNOSIS — Z111 Encounter for screening for respiratory tuberculosis: Secondary | ICD-10-CM

## 2023-01-26 NOTE — Progress Notes (Signed)
PPD Placement note Laurie Lin, 67 y.o. female is here today for placement of PPD test Reason for PPD test: Facility admission Pt taken PPD test before: Unknown due to patient's dementia Verified in allergy area and with patient that they are not allergic to the products PPD is made of (Phenol or Tween). Yes Is patient taking any oral or IV steroid medication now or have they taken it in the last month? no Has the patient ever received the BCG vaccine?: no Has the patient been in recent contact with anyone known or suspected of having active TB disease?: no      Date of exposure (if applicable): na      Name of person they were exposed to (if applicable): na Patient's Country of origin?: [place] States O: Alert and oriented in NAD. P:  PPD placed on 01/26/2023.  Patient advised to return for reading within 48-72 hours.

## 2023-01-28 ENCOUNTER — Ambulatory Visit: Payer: Medicare Other

## 2023-01-28 NOTE — Progress Notes (Signed)
PPD Reading Note PPD read and results entered in Epic[employer]. Result: 0 mm induration. Interpretation: neg If test not read within 48-72 hours of initial placement,  Allergic reaction: no  Pt has a small bruise but no rased skin. Pt's husband thought ppd was placed in upper arm reassured it was forearm.

## 2023-02-02 ENCOUNTER — Telehealth: Payer: Self-pay | Admitting: Nurse Practitioner

## 2023-02-02 NOTE — Telephone Encounter (Signed)
Patient's daughter dropped off FL2 for skilled nursing to be completed by Evelene Croon. Form is up front in Kaur's color folder. Please call her daughter Morrie Sheldon [phone] when ready for pick up.

## 2023-02-05 ENCOUNTER — Telehealth: Payer: Self-pay

## 2023-02-05 DIAGNOSIS — Z0279 Encounter for issue of other medical certificate: Secondary | ICD-10-CM

## 2023-02-05 NOTE — Telephone Encounter (Signed)
Called Patient to let her know the paperwork will be ready for pick up in the morning.

## 2023-02-05 NOTE — Telephone Encounter (Signed)
Paper work ready for pick up

## 2023-02-06 NOTE — Telephone Encounter (Signed)
Patient is aware that the paperwork is up front. I called her at 4:43 on 02/05/23.

## 2023-02-12 ENCOUNTER — Encounter: Payer: Self-pay | Admitting: Nurse Practitioner

## 2023-02-12 ENCOUNTER — Ambulatory Visit (INDEPENDENT_AMBULATORY_CARE_PROVIDER_SITE_OTHER): Payer: Medicare Other | Admitting: Nurse Practitioner

## 2023-02-12 ENCOUNTER — Telehealth: Payer: Self-pay

## 2023-02-12 VITALS — BP 110/58 | HR 92 | Temp 97.9°F | Resp 17 | Ht 70.0 in | Wt 170.2 lb

## 2023-02-12 DIAGNOSIS — Z0000 Encounter for general adult medical examination without abnormal findings: Secondary | ICD-10-CM | POA: Diagnosis not present

## 2023-02-12 DIAGNOSIS — Z1231 Encounter for screening mammogram for malignant neoplasm of breast: Secondary | ICD-10-CM

## 2023-02-12 DIAGNOSIS — Z23 Encounter for immunization: Secondary | ICD-10-CM

## 2023-02-12 DIAGNOSIS — E1065 Type 1 diabetes mellitus with hyperglycemia: Secondary | ICD-10-CM

## 2023-02-12 NOTE — Telephone Encounter (Signed)
Patient's daughter just came up and asked if provider can sign the new FL2 Form. She said she needed all her medications on the form. She wrote them out. Can the provider just sign it. She said she will come back up here to pick it up when it's ready. Form is in colorful folder.

## 2023-02-12 NOTE — Telephone Encounter (Signed)
Placed up front for Patient's daughter to pick up.

## 2023-02-12 NOTE — Telephone Encounter (Signed)
Called Patient and husband gave me the daughter's phone number to call to let them know to pick up the FL2 form tomorrow.

## 2023-02-12 NOTE — Progress Notes (Signed)
Established Patient Office Visit  Subjective:  Patient ID: Laurie Lin, female    DOB: [birth year]  Age: 67 y.o. MRN: [id number]  CC:  Chief Complaint  Patient presents with   Annual Exam    CPE    HPI  Laurie Lin presents to the clinic for her annual physical exam accompanied by her husband.   Flu: due  Tetanus: Declined COVID: Declined Pap smear: No Mammogram: Due Pnemonia : declined Dentist: Due Eye examination: 4 weeks ago at [facility]  Exercise:  walking 5 days a week  Diet: Patient does eat meat. Patient consumes fruits and veggies. Patient eat some fried food. Patient drinks water, coffee, unsweetned tea and orange juice.    HPI   Past Medical History:  Diagnosis Date   Alcohol abuse    Diabetes mellitus without complication (HCC)    GERD (gastroesophageal reflux disease)    Hypertension    Renal disorder    stage 3    Wernicke-Korsakoff syndrome (alcoholic) (HCC)     Past Surgical History:  Procedure Laterality Date   DILATION AND CURETTAGE OF UTERUS     Miscarriage   ESOPHAGOGASTRODUODENOSCOPY (EGD) WITH PROPOFOL N/A 05/22/2022   Procedure: ESOPHAGOGASTRODUODENOSCOPY (EGD) WITH PROPOFOL;  Surgeon: Toledo, Boykin Nearing, MD;  Location: [facility] ENDOSCOPY;  Service: Gastroenterology;  Laterality: N/A;   EYE SURGERY     bilateral caterac   FINGER SURGERY     IR GASTROSTOMY TUBE MOD SED  04/29/2022   IR RADIOLOGIST EVAL & MGMT  08/01/2022   IR REPLC GASTRO/COLONIC TUBE PERCUT W/FLUORO  06/04/2022   KNEE ARTHROSCOPY Left 1989   [facility]   PILONIDAL CYST DRAINAGE      Family History  Problem Relation Age of Onset   Depression Mother    Diabetes Mother     Social History   Socioeconomic History   Marital status: Married    Spouse name: Not on file   Number of children: Not on file   Years of education: Not on file   Highest education level: 12th grade  Occupational History   Not on file  Tobacco Use   Smoking status: Never   Smokeless tobacco:  Never  Vaping Use   Vaping status: Never Used  Substance and Sexual Activity   Alcohol use: Not on file    Comment: no   Drug use: No   Sexual activity: Yes  Other Topics Concern   Not on file  Social History Narrative   Not on file   Social Determinants of Health   Financial Resource Strain: Low Risk  (02/11/2023)   Overall Financial Resource Strain (CARDIA)    Difficulty of Paying Living Expenses: Not hard at all  Food Insecurity: No Food Insecurity (02/11/2023)   Hunger Vital Sign    Worried About Running Out of Food in the Last Year: Never true    Ran Out of Food in the Last Year: Never true  Transportation Needs: No Transportation Needs (02/11/2023)   PRAPARE - Administrator, Civil Service (Medical): No    Lack of Transportation (Non-Medical): No  Physical Activity: Unknown (02/11/2023)   Exercise Vital Sign    Days of Exercise per Week: 0 days    Minutes of Exercise per Session: Not on file  Stress: Stress Concern Present (02/11/2023)   [employer] of Occupational Health - Occupational Stress Questionnaire    Feeling of Stress : Rather much  Social Connections: Moderately Isolated (02/11/2023)   Social Connection  and Isolation Panel [NHANES]    Frequency of Communication with Friends and Family: More than three times a week    Frequency of Social Gatherings with Friends and Family: More than three times a week    Attends Religious Services: Never    Database administrator or Organizations: No    Attends Engineer, structural: Not on file    Marital Status: Married  Catering manager Violence: Not At Risk (05/09/2022)   Humiliation, Afraid, Rape, and Kick questionnaire    Fear of Current or Ex-Partner: No    Emotionally Abused: No    Physically Abused: No    Sexually Abused: No     Outpatient Medications Prior to Visit  Medication Sig Dispense Refill   acetaminophen (TYLENOL) 500 MG tablet Place 1 tablet (500 mg total) into feeding tube  2 (two) times daily as needed for mild pain, fever or headache. (Patient taking differently: Take 500 mg by mouth 2 (two) times daily as needed for mild pain (pain score 1-3), fever or headache.) 30 tablet 0   calcium carbonate (TUMS EX) 750 MG chewable tablet Chew 1 tablet by mouth as needed for heartburn. As needed     cholecalciferol (CHOLECALCIFEROL) 25 MCG tablet Place 2 tablets (2,000 Units total) into feeding tube daily. (Patient taking differently: Take 2,000 Units by mouth daily.)     Continuous Glucose Sensor (DEXCOM G7 SENSOR) MISC 1 Application by Does not apply route every 14 (fourteen) days.     cyanocobalamin 1000 MCG tablet Place 1 tablet (1,000 mcg total) into feeding tube daily. (Patient taking differently: Take 1,000 mcg by mouth daily.)     folic acid (FOLVITE) 1 MG tablet Place 1 tablet (1 mg total) into feeding tube daily. (Patient taking differently: Take 1 mg by mouth daily.)  3   Insulin Glargine (BASAGLAR KWIKPEN) 100 UNIT/ML Inject 8 Units into the skin at bedtime.     insulin lispro (HUMALOG KWIKPEN) 100 UNIT/ML KwikPen INJECT AS PER SLIDING SCALE IF 0-150=0 LESS THAN 60 NOTIFY NP/MD 151-200=2UNITS,201-250=3 UNITS,251-300=5 UNITS,301-350=7 UNITS,351-400=9 UNITS GREATER THAN 401 CALL NP/MD 15 mL 3   insulin lispro (HUMALOG) 100 UNIT/ML injection Inject 0-9 Units into the skin 3 (three) times daily before meals. Sliding scale per Endocrinology     Insulin Pen Needle 32G X 4 MM MISC ok to sub any brand or size needle preferred by insurance/patient, use up to 5x/day, dx E10.65     lisinopril (ZESTRIL) 2.5 MG tablet Take 2.5 mg by mouth daily.     Multiple Vitamin (MULTIVITAMIN WITH MINERALS) TABS tablet Place 1 tablet into feeding tube daily. (Patient taking differently: Take 1 tablet by mouth daily.)     Netarsudil Dimesylate (RHOPRESSA) 0.02 % SOLN Place 1 drop into both eyes every evening.     ONETOUCH DELICA LANCETS 33G MISC 1 Device by Does not apply route 4 (four) times  daily. 100 each 11   sertraline (ZOLOFT) 25 MG tablet TAKE 1 TABLET (25 MG TOTAL) BY MOUTH DAILY. 90 tablet 1   thiamine (VITAMIN B1) 100 MG tablet Place 1 tablet (100 mg total) into feeding tube daily. (Patient taking differently: Take 100 mg by mouth daily.)     torsemide (DEMADEX) 20 MG tablet Take 20 mg by mouth daily. As needed     Travoprost, BAK Free, (TRAVATAN) 0.004 % SOLN ophthalmic solution Place 1 drop into both eyes at bedtime.     benzonatate (TESSALON) 200 MG capsule Take 1 capsule (200 mg total)  by mouth 2 (two) times daily as needed for cough. 20 capsule 0   diazepam (VALIUM) 5 MG tablet Take 1 tablet (5 mg total) by mouth every 12 (twelve) hours as needed for anxiety. 6 tablet 0   nitrofurantoin, macrocrystal-monohydrate, (MACROBID) 100 MG capsule Take 1 capsule (100 mg total) by mouth 2 (two) times daily. 14 capsule 0   No facility-administered medications prior to visit.    Allergies  Allergen Reactions   Ibuprofen Nausea Only   Nsaids     Ulcerative stomach and small intestines   Brimonidine Itching    Red, itchy, sensitivity to light Red, itchy, sensitivity to light    Latex Itching    Other reaction(s): Other (see comments)    ROS Review of Systems  Constitutional: Negative.   HENT: Negative.    Respiratory: Negative.    Cardiovascular: Negative.   Genitourinary: Negative.   Skin: Negative.   Neurological:  Positive for tremors.  Psychiatric/Behavioral:  Positive for confusion and hallucinations.    Negative unless indicated in HPI.    Objective:    Physical Exam Constitutional:      Appearance: Normal appearance. She is normal weight.  HENT:     Head: Normocephalic.     Right Ear: Tympanic membrane normal.     Left Ear: Tympanic membrane normal.     Mouth/Throat:     Mouth: Mucous membranes are moist.  Eyes:     Extraocular Movements: Extraocular movements intact.     Conjunctiva/sclera: Conjunctivae normal.     Pupils: Pupils are equal,  round, and reactive to light.  Neck:     Thyroid: No thyroid mass or thyroid tenderness.  Cardiovascular:     Rate and Rhythm: Normal rate and regular rhythm.     Pulses: Normal pulses.     Heart sounds: Normal heart sounds. No murmur heard. Pulmonary:     Effort: Pulmonary effort is normal.     Breath sounds: Normal breath sounds.  Abdominal:     General: Bowel sounds are normal.     Palpations: Abdomen is soft. There is no mass.     Tenderness: There is no abdominal tenderness. There is no rebound.  Musculoskeletal:        General: No swelling.     Cervical back: Neck supple. No tenderness.     Right lower leg: No edema.     Left lower leg: No edema.  Skin:    Findings: No bruising, erythema or rash.  Neurological:     General: No focal deficit present.     Mental Status: She is alert and oriented to person, place, and time. Mental status is at baseline.  Psychiatric:        Mood and Affect: Mood normal.        Behavior: Behavior normal.        Thought Content: Thought content normal.        Judgment: Judgment normal.     BP (!) 110/58   Pulse 92   Temp 97.9 F (36.6 C) (Oral)   Resp 17   Ht 5\' 10"  (1.778 m)   Wt 170 lb 4 oz (77.2 kg)   LMP 09/09/2004 (Approximate)   SpO2 99%   BMI 24.43 kg/m  Wt Readings from Last 3 Encounters:  02/12/23 170 lb 4 oz (77.2 kg)  01/09/23 175 lb 9.6 oz (79.7 kg)  10/03/22 168 lb 12.8 oz (76.6 kg)     Health Maintenance  Topic Date Due  Medicare Annual Wellness (AWV)  Never done   FOOT EXAM  Never done   OPHTHALMOLOGY EXAM  Never done   Diabetic kidney evaluation - Urine ACR  Never done   Hepatitis C Screening  Never done   Zoster Vaccines- Shingrix (1 of 2) Never done   Colonoscopy  Never done   MAMMOGRAM  Never done   Pneumonia Vaccine 27+ Years old (1 of 1 - PCV) Never done   DEXA SCAN  Never done   HEMOGLOBIN A1C  04/11/2023   Diabetic kidney evaluation - eGFR measurement  10/10/2023   DTaP/Tdap/Td (2 - Td or Tdap)  12/23/2028   INFLUENZA VACCINE  Completed   HPV VACCINES  Aged Out   COVID-19 Vaccine  Discontinued    There are no preventive care reminders to display for this patient.  Lab Results  Component Value Date   TSH 1.977 04/18/2022   Lab Results  Component Value Date   WBC 8.1 09/10/2022   HGB 10.5 (L) 09/10/2022   HCT 32.6 (L) 09/10/2022   MCV 89.8 09/10/2022   PLT 382 09/10/2022   Lab Results  Component Value Date   NA 137 09/10/2022   K 4.5 09/10/2022   CO2 21 (L) 09/10/2022   GLUCOSE 160 (H) 09/10/2022   BUN 67 (H) 09/10/2022   CREATININE 2.33 (H) 09/10/2022   BILITOT 1.0 09/10/2022   ALKPHOS 46 09/10/2022   AST 18 09/10/2022   ALT 11 09/10/2022   PROT 8.6 (H) 09/10/2022   ALBUMIN 4.6 09/10/2022   CALCIUM 10.2 09/10/2022   ANIONGAP 10 09/10/2022   EGFR 21 10/10/2022   No results found for: "CHOL" No results found for: "HDL" No results found for: "LDLCALC" No results found for: "TRIG" No results found for: "CHOLHDL" Lab Results  Component Value Date   HGBA1C 10.1 10/10/2022      Assessment & Plan:  Annual physical exam Assessment & Plan: Encouraged patient to consume a balanced diet and regular exercise regimen. Advised to see dentist annually.  Mammogram ordered advised patient to call to schedule an appointment. IM flu vaccine administered. Declined tetanus, COVID and pneumonia vaccine at present    Need for influenza vaccination -     Flu Vaccine Trivalent High Dose (Fluad)  Encounter for screening mammogram for malignant neoplasm of breast -     3D Screening Mammogram, Left and Right; Future    Follow-up: Return in about 6 months (around 08/13/2023) for chronic management.   Kara Dies, NP

## 2023-02-12 NOTE — Telephone Encounter (Signed)
Picked up FL2 from the front and placed on [employer].

## 2023-02-16 DIAGNOSIS — Z0000 Encounter for general adult medical examination without abnormal findings: Secondary | ICD-10-CM | POA: Insufficient documentation

## 2023-02-16 NOTE — Assessment & Plan Note (Addendum)
Encouraged patient to consume a balanced diet and regular exercise regimen. Advised to see dentist annually.  Mammogram ordered advised patient to call to schedule an appointment. IM flu vaccine administered. Declined tetanus, COVID and pneumonia vaccine at present

## 2023-02-17 DIAGNOSIS — N184 Chronic kidney disease, stage 4 (severe): Secondary | ICD-10-CM | POA: Diagnosis present

## 2023-02-19 NOTE — Telephone Encounter (Signed)
Error

## 2023-02-20 ENCOUNTER — Encounter: Payer: Self-pay | Admitting: Nurse Practitioner

## 2023-02-20 ENCOUNTER — Other Ambulatory Visit: Payer: Self-pay

## 2023-02-20 ENCOUNTER — Telehealth: Payer: Self-pay

## 2023-02-20 NOTE — Telephone Encounter (Signed)
FL2 paperwork for [facility] has been completed and faxed.

## 2023-02-28 ENCOUNTER — Emergency Department
Admission: EM | Admit: 2023-02-28 | Discharge: 2023-02-28 | Disposition: A | Discharge status: Skilled Nursing Facility | Payer: Medicare Other | Attending: Emergency Medicine | Admitting: Emergency Medicine

## 2023-02-28 ENCOUNTER — Other Ambulatory Visit: Payer: Self-pay

## 2023-02-28 DIAGNOSIS — R739 Hyperglycemia, unspecified: Secondary | ICD-10-CM | POA: Diagnosis present

## 2023-02-28 DIAGNOSIS — E1065 Type 1 diabetes mellitus with hyperglycemia: Secondary | ICD-10-CM | POA: Diagnosis not present

## 2023-02-28 DIAGNOSIS — F0390 Unspecified dementia without behavioral disturbance: Secondary | ICD-10-CM | POA: Insufficient documentation

## 2023-02-28 DIAGNOSIS — R4182 Altered mental status, unspecified: Secondary | ICD-10-CM | POA: Diagnosis not present

## 2023-02-28 LAB — BASIC METABOLIC PANEL
Anion gap: 9 (ref 5–15)
BUN: 64 mg/dL — ABNORMAL HIGH (ref 8–23)
CO2: 24 mmol/L (ref 22–32)
Calcium: 9.5 mg/dL (ref 8.9–10.3)
Chloride: 102 mmol/L (ref 98–111)
Creatinine, Ser: 2.34 mg/dL — ABNORMAL HIGH (ref 0.44–1.00)
GFR, Estimated: 22 mL/min — ABNORMAL LOW (ref >60)
Glucose, Bld: 338 mg/dL — ABNORMAL HIGH (ref 70–99)
Potassium: 4.4 mmol/L (ref 3.5–5.1)
Sodium: 135 mmol/L (ref 135–145)

## 2023-02-28 LAB — URINALYSIS, W/ REFLEX TO CULTURE (INFECTION SUSPECTED)
Bacteria, UA: NONE SEEN
Bilirubin Urine: NEGATIVE
Glucose, UA: 50 mg/dL — AB
Hgb urine dipstick: NEGATIVE
Ketones, ur: NEGATIVE mg/dL
Nitrite: NEGATIVE
Protein, ur: NEGATIVE mg/dL
Specific Gravity, Urine: 1.009 (ref 1.005–1.030)
pH: 5 (ref 5.0–8.0)

## 2023-02-28 LAB — CBC
HCT: 29.1 % — ABNORMAL LOW (ref 36.0–46.0)
Hemoglobin: 9.4 g/dL — ABNORMAL LOW (ref 12.0–15.0)
MCH: 30.4 pg (ref 26.0–34.0)
MCHC: 32.3 g/dL (ref 30.0–36.0)
MCV: 94.2 fL (ref 80.0–100.0)
Platelets: 353 10*3/uL (ref 150–400)
RBC: 3.09 MIL/uL — ABNORMAL LOW (ref 3.87–5.11)
RDW: 11.6 % (ref 11.5–15.5)
WBC: 8 10*3/uL (ref 4.0–10.5)
nRBC: 0 % (ref 0.0–0.2)

## 2023-02-28 LAB — TROPONIN I (HIGH SENSITIVITY): Troponin I (High Sensitivity): 8 ng/L (ref <18)

## 2023-02-28 LAB — CBG MONITORING, ED
Glucose-Capillary: 295 mg/dL — ABNORMAL HIGH (ref 70–99)
Glucose-Capillary: 232 mg/dL — ABNORMAL HIGH (ref 70–99)

## 2023-02-28 MED ORDER — SODIUM CHLORIDE 0.9 % IV BOLUS
1000.0000 mL | Freq: Once | INTRAVENOUS | Status: AC
  Administered 2023-02-28: 1000 mL via INTRAVENOUS

## 2023-02-28 NOTE — ED Provider Notes (Signed)
[facility] Provider Note    Event Date/Time   First MD Initiated Contact with Patient 02/28/23 1309     (approximate)   History   Altered Mental Status and Hyperglycemia   HPI  Laurie Lin is a 67 y.o. female past medical history significant for dementia, type 1 diabetes presents to the emergency department for hyperglycemia.  History is provided by the patient's husband at bedside.  States that he was called today and told that she had an elevated glucose level and that she was having some chest pain.  States that she has a history of dementia.  Patient denies any complaints at this time.  He states that she does not appear confused and she is at her normal mental status baseline.  Patient denies any chest pain at this time.  Denies nausea, vomiting or diarrhea.  Denies any dysuria.  No falls or trauma.  No recent changes to medications.     Physical Exam   Triage Vital Signs: ED Triage Vitals  Encounter Vitals Group     BP 02/28/23 1223 123/65     Systolic BP Percentile --      Diastolic BP Percentile --      Pulse Rate 02/28/23 1223 82     Resp 02/28/23 1223 20     Temp 02/28/23 1223 97.9 F (36.6 C)     Temp Source 02/28/23 1223 Oral     SpO2 02/28/23 1223 100 %     Weight 02/28/23 1224 169 lb 12.1 oz (77 kg)     Height 02/28/23 1224 5\' 10"  (1.778 m)     Head Circumference --      Peak Flow --      Pain Score 02/28/23 1221 0     Pain Loc --      Pain Education --      Exclude from Growth Chart --     Most recent vital signs: Vitals:   02/28/23 1400 02/28/23 1529  BP:  (!) 161/70  Pulse:  86  Resp:  18  Temp:  97.7 F (36.5 C)  SpO2: 100% 100%    Physical Exam Constitutional:      Appearance: She is well-developed.  HENT:     Head: Atraumatic.  Eyes:     Conjunctiva/sclera: Conjunctivae normal.  Cardiovascular:     Rate and Rhythm: Regular rhythm.  Pulmonary:     Effort: No respiratory distress.  Abdominal:      General: There is no distension.  Musculoskeletal:        General: Normal range of motion.     Cervical back: Normal range of motion.  Skin:    General: Skin is warm.     Capillary Refill: Capillary refill takes less than 2 seconds.  Neurological:     Mental Status: She is alert. Mental status is at baseline.     GCS: GCS eye subscore is 4. GCS verbal subscore is 4. GCS motor subscore is 6.     Cranial Nerves: Cranial nerves 2-12 are intact.     Sensory: Sensation is intact.     Motor: Motor function is intact.  Psychiatric:        Mood and Affect: Mood normal.     IMPRESSION / MDM / ASSESSMENT AND PLAN / ED COURSE  I reviewed the triage vital signs and the nursing notes.  Differential diagnosis including hyperglycemia, DKA, hyperosmolar syndrome, ACS, urinary tract infection, electrolyte abnormality, dehydration  LABS (all labs  ordered are listed, but only abnormal results are displayed) Labs interpreted as -    Labs Reviewed  BASIC METABOLIC PANEL - Abnormal; Notable for the following components:      Result Value   Glucose, Bld 338 (*)    BUN 64 (*)    Creatinine, Ser 2.34 (*)    GFR, Estimated 22 (*)    All other components within normal limits  CBC - Abnormal; Notable for the following components:   RBC 3.09 (*)    Hemoglobin 9.4 (*)    HCT 29.1 (*)    All other components within normal limits  URINALYSIS, W/ REFLEX TO CULTURE (INFECTION SUSPECTED) - Abnormal; Notable for the following components:   Color, Urine STRAW (*)    APPearance CLEAR (*)    Glucose, UA 50 (*)    Leukocytes,Ua TRACE (*)    All other components within normal limits  CBG MONITORING, ED - Abnormal; Notable for the following components:   Glucose-Capillary 295 (*)    All other components within normal limits  CBG MONITORING, ED  CBG MONITORING, ED  TROPONIN I (HIGH SENSITIVITY)     MDM    Lab work with hyperglycemia but does not meet criteria for DKA.  No signs of urinary tract  infection.  No chest pain at this time, low suspicion for ACS, troponin is negative.  Given 1 L of IV fluids and will reevaluate glucose.  If glucose is improved plan to discharge back to nursing facility.   PROCEDURES:  Critical Care performed: No  Procedures  Patient's presentation is most consistent with acute presentation with potential threat to life or bodily function.   MEDICATIONS ORDERED IN ED: Medications  sodium chloride 0.9 % bolus 1,000 mL (1,000 mLs Intravenous New Bag/Given 02/28/23 1426)    FINAL CLINICAL IMPRESSION(S) / ED DIAGNOSES   Final diagnoses:  Hyperglycemia     Rx / DC Orders   ED Discharge Orders     None        Note:  This document was prepared using Dragon voice recognition software and may include unintentional dictation errors.   Corena Herter, MD 02/28/23 [phone]

## 2023-02-28 NOTE — ED Notes (Addendum)
RN brought pt to [facility]. Pt placed on BP and pulse ox. Pt husband here to sit with pt.

## 2023-02-28 NOTE — ED Provider Notes (Signed)
Emergency department handoff note  Care of this patient was signed out to me at the end of the previous provider shift.  All pertinent patient information was conveyed and all questions were answered.  Patient pending downtrending of her blood sugar which has downtrended since arrival and is currently 232.  Patient's family ember at bedside and counseled on the importance of appropriate insulin dosing with food.  Patient and patient's family expresses understanding  The patient has been reexamined and is ready to be discharged.  All diagnostic results have been reviewed and discussed with the patient/family.  Care plan has been outlined and the patient/family understands all current diagnoses, results, and treatment plans.  There are no new complaints, changes, or physical findings at this time.  All questions have been addressed and answered.  Patient was instructed to, and agrees to follow-up with their primary care physician as well as return to the emergency department if any new or worsening symptoms develop.   Merwyn Katos, MD 02/28/23 [phone]

## 2023-02-28 NOTE — ED Notes (Signed)
Called report to  [place], [place] [phone])

## 2023-02-28 NOTE — ED Notes (Signed)
Left voice mail for husband terry [phone]

## 2023-02-28 NOTE — ED Notes (Signed)
Chip Boer called to notify ems departure [phone]

## 2023-02-28 NOTE — ED Triage Notes (Signed)
Pt to ED [facility] from [facility] [place] Staff called EMS stating pt was "wandering" (hx dementia) and complaining of chills  173/86, CBG is 442, 97.9, hx DM, 12 lead unremarkable. 9 units insulin given at [facility] before EMS arrived  [facility] in triage is 295 Pt disoriented to time , "1970" for the year  Skin dry, respirations unlabored  Red and blue top tubes sent with regular labs

## 2023-02-28 NOTE — ED Notes (Signed)
Pt is tearful and asking for husband. Husband [name]) [phone]  was called. Voicemail was left and updated on pt discharge back to [place] facility

## 2023-02-28 NOTE — Discharge Instructions (Addendum)
You are seen in the emergency department for hyperglycemia.  You are not in DKA.  You are given IV fluids and had improvement of your glucose.  You have no signs of urinary tract infection.  Your troponin was negative, no concern for a heart attack today.  Follow-up closely with primary care provider and check your glucose frequently throughout the day.

## 2023-04-29 ENCOUNTER — Other Ambulatory Visit: Payer: Self-pay

## 2023-04-29 ENCOUNTER — Inpatient Hospital Stay
Admission: EM | Admit: 2023-04-29 | Discharge: 2023-05-05 | DRG: 300 | Disposition: A | Discharge status: Home Health | Payer: Medicare Other | Attending: Family Medicine | Admitting: Family Medicine

## 2023-04-29 ENCOUNTER — Emergency Department: Payer: Medicare Other

## 2023-04-29 DIAGNOSIS — D631 Anemia in chronic kidney disease: Secondary | ICD-10-CM | POA: Diagnosis present

## 2023-04-29 DIAGNOSIS — E1052 Type 1 diabetes mellitus with diabetic peripheral angiopathy with gangrene: Secondary | ICD-10-CM | POA: Diagnosis present

## 2023-04-29 DIAGNOSIS — F1026 Alcohol dependence with alcohol-induced persisting amnestic disorder: Secondary | ICD-10-CM | POA: Diagnosis present

## 2023-04-29 DIAGNOSIS — M879 Osteonecrosis, unspecified: Secondary | ICD-10-CM | POA: Diagnosis present

## 2023-04-29 DIAGNOSIS — Z833 Family history of diabetes mellitus: Secondary | ICD-10-CM

## 2023-04-29 DIAGNOSIS — Z8719 Personal history of other diseases of the digestive system: Secondary | ICD-10-CM

## 2023-04-29 DIAGNOSIS — I96 Gangrene, not elsewhere classified: Secondary | ICD-10-CM | POA: Diagnosis present

## 2023-04-29 DIAGNOSIS — R296 Repeated falls: Secondary | ICD-10-CM | POA: Diagnosis present

## 2023-04-29 DIAGNOSIS — K2100 Gastro-esophageal reflux disease with esophagitis, without bleeding: Secondary | ICD-10-CM | POA: Diagnosis present

## 2023-04-29 DIAGNOSIS — E1042 Type 1 diabetes mellitus with diabetic polyneuropathy: Secondary | ICD-10-CM | POA: Diagnosis present

## 2023-04-29 DIAGNOSIS — I129 Hypertensive chronic kidney disease with stage 1 through stage 4 chronic kidney disease, or unspecified chronic kidney disease: Secondary | ICD-10-CM | POA: Diagnosis present

## 2023-04-29 DIAGNOSIS — E109 Type 1 diabetes mellitus without complications: Secondary | ICD-10-CM

## 2023-04-29 DIAGNOSIS — Z1629 Resistance to other single specified antibiotic: Secondary | ICD-10-CM | POA: Diagnosis present

## 2023-04-29 DIAGNOSIS — E1065 Type 1 diabetes mellitus with hyperglycemia: Secondary | ICD-10-CM | POA: Diagnosis present

## 2023-04-29 DIAGNOSIS — N390 Urinary tract infection, site not specified: Secondary | ICD-10-CM | POA: Diagnosis present

## 2023-04-29 DIAGNOSIS — E1142 Type 2 diabetes mellitus with diabetic polyneuropathy: Secondary | ICD-10-CM | POA: Diagnosis present

## 2023-04-29 DIAGNOSIS — E1022 Type 1 diabetes mellitus with diabetic chronic kidney disease: Secondary | ICD-10-CM | POA: Diagnosis present

## 2023-04-29 DIAGNOSIS — Z8631 Personal history of diabetic foot ulcer: Secondary | ICD-10-CM

## 2023-04-29 DIAGNOSIS — I70261 Atherosclerosis of native arteries of extremities with gangrene, right leg: Secondary | ICD-10-CM | POA: Diagnosis not present

## 2023-04-29 DIAGNOSIS — Z888 Allergy status to other drugs, medicaments and biological substances status: Secondary | ICD-10-CM

## 2023-04-29 DIAGNOSIS — N184 Chronic kidney disease, stage 4 (severe): Secondary | ICD-10-CM | POA: Diagnosis present

## 2023-04-29 DIAGNOSIS — F04 Amnestic disorder due to known physiological condition: Secondary | ICD-10-CM | POA: Diagnosis present

## 2023-04-29 DIAGNOSIS — L03031 Cellulitis of right toe: Secondary | ICD-10-CM | POA: Diagnosis present

## 2023-04-29 DIAGNOSIS — F0390 Unspecified dementia without behavioral disturbance: Secondary | ICD-10-CM | POA: Diagnosis present

## 2023-04-29 DIAGNOSIS — D638 Anemia in other chronic diseases classified elsewhere: Secondary | ICD-10-CM | POA: Diagnosis present

## 2023-04-29 DIAGNOSIS — Z91040 Latex allergy status: Secondary | ICD-10-CM | POA: Diagnosis not present

## 2023-04-29 DIAGNOSIS — Z794 Long term (current) use of insulin: Secondary | ICD-10-CM

## 2023-04-29 DIAGNOSIS — Z886 Allergy status to analgesic agent status: Secondary | ICD-10-CM

## 2023-04-29 DIAGNOSIS — R109 Unspecified abdominal pain: Secondary | ICD-10-CM

## 2023-04-29 DIAGNOSIS — Z79899 Other long term (current) drug therapy: Secondary | ICD-10-CM

## 2023-04-29 DIAGNOSIS — R112 Nausea with vomiting, unspecified: Secondary | ICD-10-CM

## 2023-04-29 DIAGNOSIS — Z1152 Encounter for screening for COVID-19: Secondary | ICD-10-CM | POA: Diagnosis not present

## 2023-04-29 LAB — URINALYSIS, ROUTINE W REFLEX MICROSCOPIC
Bilirubin Urine: NEGATIVE
Glucose, UA: NEGATIVE mg/dL
Ketones, ur: NEGATIVE mg/dL
Nitrite: NEGATIVE
Protein, ur: NEGATIVE mg/dL
Specific Gravity, Urine: 1.001 — ABNORMAL LOW (ref 1.005–1.030)
pH: 7 (ref 5.0–8.0)

## 2023-04-29 LAB — CBC
HCT: 28.3 % — ABNORMAL LOW (ref 36.0–46.0)
Hemoglobin: 8.9 g/dL — ABNORMAL LOW (ref 12.0–15.0)
MCH: 30.2 pg (ref 26.0–34.0)
MCHC: 31.4 g/dL (ref 30.0–36.0)
MCV: 95.9 fL (ref 80.0–100.0)
Platelets: 393 10*3/uL (ref 150–400)
RBC: 2.95 MIL/uL — ABNORMAL LOW (ref 3.87–5.11)
RDW: 13.2 % (ref 11.5–15.5)
WBC: 11.9 10*3/uL — ABNORMAL HIGH (ref 4.0–10.5)
nRBC: 0 % (ref 0.0–0.2)

## 2023-04-29 LAB — BASIC METABOLIC PANEL
Anion gap: 11 (ref 5–15)
BUN: 69 mg/dL — ABNORMAL HIGH (ref 8–23)
CO2: 23 mmol/L (ref 22–32)
Calcium: 9.1 mg/dL (ref 8.9–10.3)
Chloride: 105 mmol/L (ref 98–111)
Creatinine, Ser: 2.36 mg/dL — ABNORMAL HIGH (ref 0.44–1.00)
GFR, Estimated: 22 mL/min — ABNORMAL LOW (ref >60)
Glucose, Bld: 155 mg/dL — ABNORMAL HIGH (ref 70–99)
Potassium: 4.8 mmol/L (ref 3.5–5.1)
Sodium: 139 mmol/L (ref 135–145)

## 2023-04-29 LAB — RESP PANEL BY RT-PCR (RSV, FLU A&B, COVID)  RVPGX2
Influenza A by PCR: NEGATIVE
Influenza B by PCR: NEGATIVE
Resp Syncytial Virus by PCR: NEGATIVE
SARS Coronavirus 2 by RT PCR: NEGATIVE

## 2023-04-29 MED ORDER — METRONIDAZOLE 500 MG/100ML IV SOLN
500.0000 mg | Freq: Once | INTRAVENOUS | Status: AC
  Administered 2023-04-30: 500 mg via INTRAVENOUS
  Filled 2023-04-29: qty 100

## 2023-04-29 MED ORDER — VANCOMYCIN HCL 1750 MG/350ML IV SOLN
1750.0000 mg | Freq: Once | INTRAVENOUS | Status: AC
  Administered 2023-04-30: 1750 mg via INTRAVENOUS
  Filled 2023-04-29: qty 350

## 2023-04-29 MED ORDER — SODIUM CHLORIDE 0.9 % IV SOLN
1.0000 g | Freq: Once | INTRAVENOUS | Status: AC
  Administered 2023-04-29: 1 g via INTRAVENOUS
  Filled 2023-04-29: qty 10

## 2023-04-29 MED ORDER — SODIUM CHLORIDE 0.9 % IV BOLUS
500.0000 mL | Freq: Once | INTRAVENOUS | Status: AC
  Administered 2023-04-29: 500 mL via INTRAVENOUS

## 2023-04-29 NOTE — ED Triage Notes (Signed)
 Pt to ED [facility] for fall x2 today. Pt cannot state what happened. Hx dementia. Emesis x2 today. Pt in NAD. No obvious deformities noted.  Pt c/o neck pain

## 2023-04-29 NOTE — Progress Notes (Signed)
 ED Pharmacy Antibiotic Sign Off An antibiotic consult was received from an ED provider for Vancoomycin per pharmacy dosing for cellulitis. A chart review was completed to assess appropriateness.   The following one time order(s) were placed:  Vancomycin  1750 mg per pt wt: 74.8 kg  Further antibiotic and/or antibiotic pharmacy consults should be ordered by the admitting provider if indicated.   Thank you for allowing pharmacy to be a part of this patient's care.   Thank you, Rankin CANDIE Dills, PharmD, [facility] 04/29/2023 11:33 PM

## 2023-04-29 NOTE — ED Provider Notes (Signed)
 [facility] Provider Note    Event Date/Time   First MD Initiated Contact with Patient 04/29/23 1918     (approximate)   History   Fall  Level V Caveat:  Dementia  HPI  Laurie Lin is a 68 y.o. female presents the ER for reported fall some hip pain.  Patient unable to provide much additional history due to h/o wernicke's-korsakoff.     Physical Exam   Triage Vital Signs: ED Triage Vitals [04/29/23 1758]  Encounter Vitals Group     BP (!) 119/56     Systolic BP Percentile      Diastolic BP Percentile      Pulse Rate (!) 102     Resp 16     Temp 98.9 F (37.2 C)     Temp src      SpO2 100 %     Weight 165 lb (74.8 kg)     Height 5' 10 (1.778 m)     Head Circumference      Peak Flow      Pain Score 6     Pain Loc      Pain Education      Exclude from Growth Chart     Most recent vital signs: Vitals:   04/29/23 1758  BP: (!) 119/56  Pulse: (!) 102  Resp: 16  Temp: 98.9 F (37.2 C)  SpO2: 100%     Constitutional: Alert  Eyes: Conjunctivae are normal.  Head: Atraumatic. Nose: No congestion/rhinnorhea. Mouth/Throat: Mucous membranes are moist.   Neck: Painless ROM.  Cardiovascular:   Good peripheral circulation. Respiratory: Normal respiratory effort.  No retractions.  Gastrointestinal: Soft mild tenderness palpation no guarding or rebound. Musculoskeletal: Change of the tip of the right second toe.  DP and PT pulses palpable.  Otherwise good cap refill. Neurologic:  MAE spontaneously. No gross focal neurologic deficits are appreciated.  Skin:  Skin is warm, dry and intact. No rash noted.    ED Results / Procedures / Treatments   Labs (all labs ordered are listed, but only abnormal results are displayed) Labs Reviewed  CBC - Abnormal; Notable for the following components:      Result Value   WBC 11.9 (*)    RBC 2.95 (*)    Hemoglobin 8.9 (*)    HCT 28.3 (*)    All other components within normal limits  BASIC  METABOLIC PANEL - Abnormal; Notable for the following components:   Glucose, Bld 155 (*)    BUN 69 (*)    Creatinine, Ser 2.36 (*)    GFR, Estimated 22 (*)    All other components within normal limits  URINALYSIS, ROUTINE W REFLEX MICROSCOPIC - Abnormal; Notable for the following components:   Color, Urine YELLOW (*)    APPearance HAZY (*)    Specific Gravity, Urine 1.001 (*)    Hgb urine dipstick SMALL (*)    Leukocytes,Ua TRACE (*)    Bacteria, UA RARE (*)    All other components within normal limits  RESP PANEL BY RT-PCR (RSV, FLU A&B, COVID)  [id number]  URINE CULTURE     EKG  ED ECG REPORT I, Belvie Essex, the attending physician, personally viewed and interpreted this ECG.   Date: 04/29/2023  EKG Time: 18:06  Rate: 105  Rhythm:  sinus  Axis: normal  Intervals: normal  ST&T Change: nonspecific st abn, no stmei    RADIOLOGY Please see ED Course for my review and interpretation.  I personally reviewed all radiographic images ordered to evaluate for the above acute complaints and reviewed radiology reports and findings.  These findings were personally discussed with the patient.  Please see medical record for radiology report.    PROCEDURES:  Critical Care performed:   Procedures   MEDICATIONS ORDERED IN ED: Medications  sodium chloride  0.9 % bolus 500 mL (has no administration in time range)  cefTRIAXone  (ROCEPHIN ) 1 g in sodium chloride  0.9 % 100 mL IVPB (has no administration in time range)  metroNIDAZOLE  (FLAGYL ) IVPB 500 mg (has no administration in time range)     IMPRESSION / MDM / ASSESSMENT AND PLAN / ED COURSE  I reviewed the triage vital signs and the nursing notes.                              Differential diagnosis includes, but is not limited to, Dehydration, sepsis, pna, uti, hypoglycemia, cva, drug effect,  sdh, iph, fracture  Patient presenting to the ER for evaluation of symptoms as described above.  Based on symptoms, risk factors  and considered above differential, this presenting complaint could reflect a potentially life-threatening illness therefore the patient will be placed on continuous pulse oximetry and telemetry for monitoring.  Laboratory evaluation will be sent to evaluate for the above complaints.       Clinical Course as of 04/29/23 2320  Wed Apr 29, 2023  2134 Per nursing report the urine was quite purulent on exam we will send for culture.  Will cover with antibiotics but remainder of workup is reassuring.  Patient does appear stable and appropriate for outpatient follow-up.  She was able to ambulate. [PR]  2144 Family now at bedside.  Per their report patient was having episodes of abdominal pain as well as nausea and vomiting.  Also had some bleeding from the right second toe.  On examination patient does have some gangrenous changes to the right toe.  Family had noted some changes a few months ago and they were planning on following up with podiatry but today it is worse.  She is a type I diabetic.  Will order x-ray as well as CT imaging.  Will order IV antibiotics. [PR]    Clinical Course User Index [PR] Lang Dover, MD   Imaging without evidence of acute intra-abdominal process.  Will consult hospitalist for admission for IV antibiotics podiatry consultation and further evaluation.   FINAL CLINICAL IMPRESSION(S) / ED DIAGNOSES   Final diagnoses:  Gangrene of toe of right foot (HCC)  Nausea and vomiting, unspecified vomiting type  Frequent falls     Rx / DC Orders   ED Discharge Orders     None        Note:  This document was prepared using Dragon voice recognition software and may include unintentional dictation errors.    Lang Dover, MD 04/29/23 [phone]

## 2023-04-30 ENCOUNTER — Inpatient Hospital Stay: Payer: Medicare Other

## 2023-04-30 DIAGNOSIS — I96 Gangrene, not elsewhere classified: Secondary | ICD-10-CM

## 2023-04-30 DIAGNOSIS — R109 Unspecified abdominal pain: Secondary | ICD-10-CM

## 2023-04-30 LAB — IRON AND TIBC
Iron: 25 ug/dL — ABNORMAL LOW (ref 28–170)
Saturation Ratios: 8 % — ABNORMAL LOW (ref 10.4–31.8)
TIBC: 311 ug/dL (ref 250–450)
UIBC: 286 ug/dL

## 2023-04-30 LAB — CBC
HCT: 24.8 % — ABNORMAL LOW (ref 36.0–46.0)
HCT: 25.2 % — ABNORMAL LOW (ref 36.0–46.0)
Hemoglobin: 7.9 g/dL — ABNORMAL LOW (ref 12.0–15.0)
Hemoglobin: 8.3 g/dL — ABNORMAL LOW (ref 12.0–15.0)
MCH: 30.6 pg (ref 26.0–34.0)
MCH: 31 pg (ref 26.0–34.0)
MCHC: 31.9 g/dL (ref 30.0–36.0)
MCHC: 32.9 g/dL (ref 30.0–36.0)
MCV: 96.1 fL (ref 80.0–100.0)
MCV: 94 fL (ref 80.0–100.0)
Platelets: 360 10*3/uL (ref 150–400)
Platelets: 372 10*3/uL (ref 150–400)
RBC: 2.58 MIL/uL — ABNORMAL LOW (ref 3.87–5.11)
RBC: 2.68 MIL/uL — ABNORMAL LOW (ref 3.87–5.11)
RDW: 13.2 % (ref 11.5–15.5)
RDW: 13.2 % (ref 11.5–15.5)
WBC: 8.4 10*3/uL (ref 4.0–10.5)
WBC: 7.3 10*3/uL (ref 4.0–10.5)
nRBC: 0 % (ref 0.0–0.2)
nRBC: 0 % (ref 0.0–0.2)

## 2023-04-30 LAB — HEPATIC FUNCTION PANEL
ALT: 13 U/L (ref 0–44)
AST: 16 U/L (ref 15–41)
Albumin: 3.2 g/dL — ABNORMAL LOW (ref 3.5–5.0)
Alkaline Phosphatase: 40 U/L (ref 38–126)
Bilirubin, Direct: <0.1 mg/dL (ref 0.0–0.2)
Total Bilirubin: 0.7 mg/dL (ref 0.0–1.2)
Total Protein: 6.6 g/dL (ref 6.5–8.1)

## 2023-04-30 LAB — PREALBUMIN: Prealbumin: 21 mg/dL (ref 18–38)

## 2023-04-30 LAB — HEMOGLOBIN A1C
Hgb A1c MFr Bld: 9.2 % — ABNORMAL HIGH (ref 4.8–5.6)
Mean Plasma Glucose: 217.34 mg/dL

## 2023-04-30 LAB — CBG MONITORING, ED
Glucose-Capillary: 159 mg/dL — ABNORMAL HIGH (ref 70–99)
Glucose-Capillary: 158 mg/dL — ABNORMAL HIGH (ref 70–99)

## 2023-04-30 LAB — C-REACTIVE PROTEIN: CRP: 2.3 mg/dL — ABNORMAL HIGH (ref <1.0)

## 2023-04-30 LAB — SEDIMENTATION RATE: Sed Rate: 56 mm/h — ABNORMAL HIGH (ref 0–30)

## 2023-04-30 LAB — GLUCOSE, CAPILLARY: Glucose-Capillary: 269 mg/dL — ABNORMAL HIGH (ref 70–99)

## 2023-04-30 LAB — CREATININE, SERUM
Creatinine, Ser: 2.18 mg/dL — ABNORMAL HIGH (ref 0.44–1.00)
GFR, Estimated: 24 mL/min — ABNORMAL LOW (ref >60)

## 2023-04-30 LAB — FERRITIN: Ferritin: 159 ng/mL (ref 11–307)

## 2023-04-30 LAB — HIV ANTIBODY (ROUTINE TESTING W REFLEX): HIV Screen 4th Generation wRfx: NONREACTIVE

## 2023-04-30 LAB — VITAMIN B12: Vitamin B-12: 432 pg/mL (ref 180–914)

## 2023-04-30 LAB — RETICULOCYTES
Immature Retic Fract: 10.8 % (ref 2.3–15.9)
RBC.: 2.54 MIL/uL — ABNORMAL LOW (ref 3.87–5.11)
Retic Count, Absolute: 28.7 10*3/uL (ref 19.0–186.0)
Retic Ct Pct: 1.1 % (ref 0.4–3.1)

## 2023-04-30 LAB — FOLATE: Folate: 36 ng/mL (ref >5.9)

## 2023-04-30 LAB — LIPASE, BLOOD: Lipase: 20 U/L (ref 11–51)

## 2023-04-30 MED ORDER — RISPERIDONE 1 MG PO TABS
1.0000 mg | ORAL_TABLET | Freq: Every day | ORAL | Status: DC
  Administered 2023-04-30 – 2023-05-02 (×3): 1 mg via ORAL
  Filled 2023-04-30 (×4): qty 1

## 2023-04-30 MED ORDER — FOLIC ACID 1 MG PO TABS
1.0000 mg | ORAL_TABLET | Freq: Every day | ORAL | Status: DC
  Administered 2023-04-30: 1 mg via ORAL
  Filled 2023-04-30: qty 1

## 2023-04-30 MED ORDER — ADULT MULTIVITAMIN W/MINERALS CH
1.0000 | ORAL_TABLET | Freq: Every day | ORAL | Status: DC
  Administered 2023-04-30 – 2023-05-05 (×5): 1 via ORAL
  Filled 2023-04-30 (×5): qty 1

## 2023-04-30 MED ORDER — INSULIN ASPART 100 UNIT/ML IJ SOLN
0.0000 [IU] | Freq: Three times a day (TID) | INTRAMUSCULAR | Status: DC
  Administered 2023-04-30: 2 [IU] via SUBCUTANEOUS
  Administered 2023-04-30: 1 [IU] via SUBCUTANEOUS
  Administered 2023-05-01: 2 [IU] via SUBCUTANEOUS
  Administered 2023-05-02: 3 [IU] via SUBCUTANEOUS
  Administered 2023-05-02: 1 [IU] via SUBCUTANEOUS
  Administered 2023-05-02: 3 [IU] via SUBCUTANEOUS
  Administered 2023-05-03: 1 [IU] via SUBCUTANEOUS
  Administered 2023-05-03 – 2023-05-04 (×2): 2 [IU] via SUBCUTANEOUS
  Administered 2023-05-05: 1 [IU] via SUBCUTANEOUS
  Administered 2023-05-05: 3 [IU] via SUBCUTANEOUS
  Filled 2023-04-30 (×12): qty 1

## 2023-04-30 MED ORDER — ONDANSETRON HCL 4 MG PO TABS: 4.0000 mg | ORAL_TABLET | Freq: Four times a day (QID) | ORAL | Status: DC | PRN

## 2023-04-30 MED ORDER — SERTRALINE HCL 50 MG PO TABS
50.0000 mg | ORAL_TABLET | Freq: Every day | ORAL | Status: DC
  Administered 2023-04-30 – 2023-05-04 (×5): 50 mg via ORAL
  Filled 2023-04-30 (×5): qty 1

## 2023-04-30 MED ORDER — ONDANSETRON HCL 4 MG/2ML IJ SOLN: 4.0000 mg | Freq: Four times a day (QID) | INTRAMUSCULAR | Status: DC | PRN

## 2023-04-30 MED ORDER — ACETAMINOPHEN 325 MG PO TABS
650.0000 mg | ORAL_TABLET | Freq: Four times a day (QID) | ORAL | Status: DC | PRN
  Administered 2023-05-03: 650 mg via ORAL
  Filled 2023-04-30: qty 2

## 2023-04-30 MED ORDER — VITAMIN B-12 1000 MCG PO TABS
1000.0000 ug | ORAL_TABLET | Freq: Every day | ORAL | Status: DC
  Administered 2023-04-30 – 2023-05-05 (×5): 1000 ug via ORAL
  Filled 2023-04-30 (×4): qty 1
  Filled 2023-04-30: qty 2

## 2023-04-30 MED ORDER — THIAMINE HCL 100 MG PO TABS
100.0000 mg | ORAL_TABLET | Freq: Every day | ORAL | Status: DC
  Administered 2023-04-30 – 2023-05-05 (×5): 100 mg via ORAL
  Filled 2023-04-30 (×11): qty 1

## 2023-04-30 MED ORDER — INSULIN ASPART 100 UNIT/ML IJ SOLN
0.0000 [IU] | Freq: Every day | INTRAMUSCULAR | Status: DC
  Administered 2023-04-30: 3 [IU] via SUBCUTANEOUS
  Filled 2023-04-30: qty 1

## 2023-04-30 MED ORDER — VITAMIN D 25 MCG (1000 UNIT) PO TABS
2000.0000 [IU] | ORAL_TABLET | Freq: Every day | ORAL | Status: DC
  Administered 2023-04-30 – 2023-05-05 (×5): 2000 [IU] via ORAL
  Filled 2023-04-30 (×5): qty 2

## 2023-04-30 MED ORDER — PANTOPRAZOLE SODIUM 40 MG IV SOLR
40.0000 mg | INTRAVENOUS | Status: DC
  Administered 2023-04-30 – 2023-05-01 (×2): 40 mg via INTRAVENOUS
  Filled 2023-04-30 (×2): qty 10

## 2023-04-30 MED ORDER — INSULIN GLARGINE-YFGN 100 UNIT/ML ~~LOC~~ SOLN
8.0000 [IU] | Freq: Every day | SUBCUTANEOUS | Status: DC
  Administered 2023-04-30 – 2023-05-04 (×6): 8 [IU] via SUBCUTANEOUS
  Filled 2023-04-30 (×7): qty 0.08

## 2023-04-30 MED ORDER — SODIUM CHLORIDE 0.9 % IV SOLN
1.0000 g | INTRAVENOUS | Status: DC
  Administered 2023-04-30 – 2023-05-02 (×3): 1 g via INTRAVENOUS
  Filled 2023-04-30 (×3): qty 10

## 2023-04-30 MED ORDER — HYDROCODONE-ACETAMINOPHEN 5-325 MG PO TABS
1.0000 | ORAL_TABLET | ORAL | Status: DC | PRN
  Administered 2023-05-01 – 2023-05-05 (×6): 1 via ORAL
  Filled 2023-04-30 (×6): qty 1

## 2023-04-30 MED ORDER — SERTRALINE HCL 50 MG PO TABS
25.0000 mg | ORAL_TABLET | Freq: Every day | ORAL | Status: DC
  Administered 2023-04-30: 25 mg via ORAL
  Filled 2023-04-30: qty 1

## 2023-04-30 MED ORDER — VANCOMYCIN HCL IN DEXTROSE 1-5 GM/200ML-% IV SOLN
1000.0000 mg | INTRAVENOUS | Status: DC
  Filled 2023-04-30: qty 200

## 2023-04-30 MED ORDER — ACETAMINOPHEN 650 MG RE SUPP
650.0000 mg | Freq: Four times a day (QID) | RECTAL | Status: DC | PRN
Start: 2023-04-30 — End: 2023-05-05

## 2023-04-30 MED ORDER — HEPARIN SODIUM (PORCINE) 5000 UNIT/ML IJ SOLN
5000.0000 [IU] | Freq: Three times a day (TID) | INTRAMUSCULAR | Status: DC
  Administered 2023-04-30 – 2023-05-05 (×11): 5000 [IU] via SUBCUTANEOUS
  Filled 2023-04-30 (×13): qty 1

## 2023-04-30 NOTE — TOC Initial Note (Signed)
 Transition of Care [facility]) - Initial/Assessment Note    Patient Details  Name: Laurie Lin MRN: [id number] Date of Birth: [birth year]  [facility] CM/SW Contact:    Lauraine JAYSON Carpen, LCSW Phone Number: 04/30/2023, 12:42 PM  Clinical Narrative:   Patient not oriented. CSW met with husband, introduced role, and explained that discharge planning would be discussed. Patient is a resident at [facility] on their memory care side. Husband was unsure if she was receiving home health prior to admission. CSW called and spoke to [facility]. Patient was not receiving home health prior to admission but they were going to ask her MD for orders today. CSW asked hospitalist for PT and OT consults when appropriate. [facility] preferred home health agencies are [facility]. Patient has a RW at the facility but was not using it. Pam stated she was ambulatory. No further concerns. CSW encouraged patient's husband to contact CSW as needed. CSW will continue to follow patient and her husband for support and facilitate return to [facility] when medically stable.               Expected Discharge Plan: Assisted Living (with home health) Barriers to Discharge: Continued Medical Work up   Patient Goals and CMS Choice            Expected Discharge Plan and Services     Post Acute Care Choice:  (TBD) Living arrangements for the past 2 months: Assisted Living Facility                                      Prior Living Arrangements/Services Living arrangements for the past 2 months: Assisted Living Facility Lives with:: Facility Resident Patient language and need for interpreter reviewed:: Yes Do you feel safe going back to the place where you live?: Yes      Need for Family Participation in Patient Care: Yes (Comment) Care giver support system in place?: Yes (comment)   Criminal Activity/Legal Involvement Pertinent to Current Situation/Hospitalization: No - Comment as  needed  Activities of Daily Living      Permission Sought/Granted Permission sought to share information with : Facility Medical Sales Representative, Family Supports    Share Information with NAME: Anie Juniel  Permission granted to share info w AGENCY: [facility]  Permission granted to share info w Relationship: Husband  Permission granted to share info w Contact Information: [phone]  Emotional Assessment Appearance:: Appears stated age Attitude/Demeanor/Rapport: Unable to Assess Affect (typically observed): Unable to Assess Orientation: :  (Disoriented x 4) Alcohol / Substance Use: Not Applicable Psych Involvement: No (comment)  Admission diagnosis:  Gangrene of toe of left foot (HCC) [I96] Patient Active Problem List   Diagnosis Date Noted   Abdominal pain and vomiting 04/30/2023   Gangrene of toe of right foot (HCC) 04/29/2023   History of hematemesis 04/29/2023   Type 1 diabetes mellitus (HCC) 04/29/2023   History of hematemesis, grade D esophagitis on EGD 04/2022 04/29/2023   Chronic kidney disease, stage IV (severe) (HCC) 02/17/2023   Annual physical exam 02/16/2023   Hallucination 01/18/2023   Confusion 01/18/2023   Acute cystitis with hematuria 01/18/2023   UTI symptoms 10/12/2022   Cough 09/25/2022   Anxiety 09/08/2022   Lower extremity edema 07/13/2022   Cognitive impairment 07/13/2022   CKD (chronic kidney disease), stage IV (HCC) 07/13/2022   Hypomagnesemia 05/26/2022   Dysphagia 05/24/2022   Orthostatic hypotension 05/23/2022  Hematemesis 05/22/2022   Acute post-hemorrhagic anemia 05/22/2022   Acute kidney injury superimposed on CKD (HCC) 05/22/2022   Weakness 05/22/2022   Hypokalemia 05/22/2022   Aspiration pneumonia (HCC) 04/25/2022   Overweight (BMI 25.0-29.9) 04/19/2022   Encephalopathy acute 04/18/2022   Alcohol abuse 04/18/2022   GERD without esophagitis 04/18/2022   Wernicke-Korsakoff syndrome (HCC) 04/18/2022   Trochanteric bursitis of  left hip 01/17/2020   Chronic disease anemia 11/03/2019   Chronic kidney disease, stage 3 unspecified (HCC) 11/03/2019   Burn of second degree of left foot, initial encounter 12/28/2018   Neurogenic pain 09/30/2017   Hyponatremia 09/30/2017   Hypocalcemia 09/30/2017   Hypoalbuminemia 09/30/2017   Diabetes mellitus with stage 4 chronic kidney disease GFR 15-29 (HCC) 09/30/2017   Diabetic peripheral neuropathy (HCC) 09/30/2017   Chronic pain of hip (B) 09/30/2017   Chronic sacroiliac joint pain (B) 09/30/2017   Lumbar facet joint syndrome 09/30/2017   H/O diabetic foot ulcer 09/09/2017   Chronic low back pain (Primary Area of Pain) (Bilateral) (R>L) w/ sciatica (Bilateral) 09/09/2017   Chronic lower extremity pain (Secondary Area of Pain) (Bilateral) (R>L) 09/09/2017   Chronic pain syndrome 09/09/2017   Long term current use of opiate analgesic 09/09/2017   Pharmacologic therapy 09/09/2017   Disorder of skeletal system 09/09/2017   Problems influencing health status 09/09/2017   Other cirrhosis of liver (HCC) 06/03/2017   Chronic lumbar radiculopathy 06/03/2017   Chronic neck pain 06/03/2017   Type 1 diabetes mellitus with hyperglycemia (HCC) 04/30/2017   Atrial flutter (HCC) 04/30/2017   Neuropathy 04/30/2017   Hiatal hernia 04/30/2017   Cataracts, bilateral 04/30/2017   Albuminuria 04/30/2017   DKA (diabetic ketoacidosis) (HCC) 03/21/2017   Reactive depression 01/03/2015   Essential hypertension 09/28/2014   Hepatic steatosis 08/17/2013   Abnormal levels of other serum enzymes 08/17/2013   PCP:  Vincente Saber, NP Pharmacy:   [facility] [phone] - [place], [place] - [address] [place] [address] Phone: [phone] Fax: [phone]     Social Drivers of Health (SDOH) Social History: SDOH Screenings   Food Insecurity: No Food Insecurity (02/11/2023)  Housing: Low Risk  (02/11/2023)  Transportation Needs: No Transportation Needs (02/11/2023)  Utilities:  Not At Risk (05/09/2022)  Depression (PHQ2-9): Low Risk  (02/12/2023)  Recent Concern: Depression (PHQ2-9) - High Risk (01/09/2023)  Financial Resource Strain: Low Risk  (02/11/2023)  Physical Activity: Unknown (02/11/2023)  Social Connections: Moderately Isolated (02/11/2023)  Stress: Stress Concern Present (02/11/2023)  Tobacco Use: Low Risk  (04/29/2023)   SDOH Interventions:     Readmission Risk Interventions    04/30/2023   12:40 PM 05/23/2022   10:51 AM 05/07/2022    9:48 AM  Readmission Risk Prevention Plan  Transportation Screening Complete Complete Complete  PCP or Specialist Appt within 3-5 Days Complete Complete Complete  Social Work Consult for Recovery Care Planning/Counseling Complete Complete Complete  Palliative Care Screening Not Applicable Not Applicable Not Applicable  Medication Review Oceanographer) Complete Complete Complete

## 2023-04-30 NOTE — ED Notes (Signed)
 Pt assisted per family to restroom requires assist x2 and wc

## 2023-04-30 NOTE — ED Notes (Signed)
 Pt provided warm blankets and remains calm and cooperative appears to be resting quietly in hall stretcher 20g RAC piv cdi patent secured not infiltrated @ this time

## 2023-04-30 NOTE — H&P (Signed)
 History and Physical    Patient: Laurie Lin FMW:[id number] DOB: 15-Jun-1955 DOA: 04/29/2023 DOS: the patient was seen and examined on 04/30/2023 PCP: Vincente Saber, NP  Patient coming from: Home  Chief Complaint:  Chief Complaint  Patient presents with   Fall    HPI: Laurie Lin is a 68 y.o. female with medical history significant for Warnicke Korsakoff encephalopathy, CKD 4, type 1 diabetes,  hematemesis 04/2022 with EGD showing grade D esophagitis and Mallory-Weiss tear, prior PEG tube dependence, who was brought to the ED with abdominal pain and vomiting, as well as concerns for bleeding from the right second toe.  History taken from ED provider.  Patient is unable to contribute to the history due to chronic encephalopathy. ED course and data reviewed: Tachycardic to 102 with soft BP 119/56 and otherwise normal vitals Notable findings on workup include the following: WBC 11,900, negative respiratory viral panel, trace leukocyte on urinalysis. Creatinine At baseline at 2.36 Hemoglobin 8.9 down from 9.4 couple months prior EKG, personally viewed and interpreted showing sinus tachycardia at 104 nonspecific T wave changes CT head and C-spine nonacute Chest x-ray without active disease Hip x-ray without fracture Right foot x-ray negative for osteomyelitis CT abdomen and pelvis nonacute  Patient started on vancomycin , Rocephin  and Flagyl  for cellulitis/wound infection  Hospitalist consulted for admission    Past Medical History:  Diagnosis Date   Alcohol abuse    Diabetes mellitus without complication (HCC)    GERD (gastroesophageal reflux disease)    Hypertension    Renal disorder    stage 3    Wernicke-Korsakoff syndrome (alcoholic) (HCC)    Past Surgical History:  Procedure Laterality Date   DILATION AND CURETTAGE OF UTERUS     Miscarriage   ESOPHAGOGASTRODUODENOSCOPY (EGD) WITH PROPOFOL  N/A 05/22/2022   Procedure: ESOPHAGOGASTRODUODENOSCOPY (EGD) WITH PROPOFOL ;   Surgeon: Toledo, Ladell POUR, MD;  Location: [facility] ENDOSCOPY;  Service: Gastroenterology;  Laterality: N/A;   EYE SURGERY     bilateral caterac   FINGER SURGERY     IR GASTROSTOMY TUBE MOD SED  04/29/2022   IR RADIOLOGIST EVAL & MGMT  08/01/2022   IR REPLC GASTRO/COLONIC TUBE PERCUT W/FLUORO  06/04/2022   KNEE ARTHROSCOPY Left 1989   [facility]   PILONIDAL CYST DRAINAGE     Social History:  reports that she has never smoked. She has never used smokeless tobacco. She reports that she does not use drugs. No history on file for alcohol use.  Allergies  Allergen Reactions   Ibuprofen Nausea Only   Nsaids     Ulcerative stomach and small intestines   Brimonidine Itching    Red, itchy, sensitivity to light Red, itchy, sensitivity to light    Latex Itching    Other reaction(s): Other (see comments)    Family History  Problem Relation Age of Onset   Depression Mother    Diabetes Mother     Prior to Admission medications   Medication Sig Start Date End Date Taking? Authorizing Provider  acetaminophen  (TYLENOL ) 500 MG tablet Place 1 tablet (500 mg total) into feeding tube 2 (two) times daily as needed for mild pain, fever or headache. Patient taking differently: Take 500 mg by mouth 2 (two) times daily as needed for mild pain (pain score 1-3), fever or headache. 05/26/22   Josette Ade, MD  calcium  carbonate (TUMS EX) 750 MG chewable tablet Chew 1 tablet by mouth as needed for heartburn. As needed    [provider]  cholecalciferol  (CHOLECALCIFEROL )  25 MCG tablet Place 2 tablets (2,000 Units total) into feeding tube daily. Patient taking differently: Take 2,000 Units by mouth daily. 05/12/22   Awanda City, MD  Continuous Glucose Sensor (DEXCOM G7 SENSOR) MISC 1 Application by Does not apply route every 14 (fourteen) days.    [provider]  cyanocobalamin  1000 MCG tablet Place 1 tablet (1,000 mcg total) into feeding tube daily. Patient taking differently: Take 1,000 mcg by mouth  daily. 05/06/22   Caleen Qualia, MD  folic acid  (FOLVITE ) 1 MG tablet Place 1 tablet (1 mg total) into feeding tube daily. Patient taking differently: Take 1 mg by mouth daily. 05/05/22 05/05/23  Amin, Sumayya, MD  Insulin  Glargine (BASAGLAR  KWIKPEN) 100 UNIT/ML Inject 8 Units into the skin at bedtime. 05/26/22   Josette Ade, MD  insulin  lispro (HUMALOG KWIKPEN) 100 UNIT/ML KwikPen INJECT AS PER SLIDING SCALE IF 0-150=0 LESS THAN 60 NOTIFY NP/MD 151-200=2UNITS,201-250=3 UNITS,251-300=5 UNITS,301-350=7 UNITS,351-400=9 UNITS GREATER THAN 401 CALL NP/MD 08/25/22   Vincente Saber, NP  insulin  lispro (HUMALOG) 100 UNIT/ML injection Inject 0-9 Units into the skin 3 (three) times daily before meals. Sliding scale per Endocrinology    [provider]  Insulin  Pen Needle 32G X 4 MM MISC ok to sub any brand or size needle preferred by insurance/patient, use up to 5x/day, dx E10.65 03/02/18   [provider]  lisinopril  (ZESTRIL ) 2.5 MG tablet Take 2.5 mg by mouth daily. 07/28/22   [provider]  Multiple Vitamin (MULTIVITAMIN WITH MINERALS) TABS tablet Place 1 tablet into feeding tube daily. Patient taking differently: Take 1 tablet by mouth daily. 05/12/22   Awanda City, MD  Netarsudil  Dimesylate (RHOPRESSA ) 0.02 % SOLN Place 1 drop into both eyes every evening.    [provider]  [facility] DELICA LANCETS 33G MISC 1 Device by Does not apply route 4 (four) times daily. 05/20/17   Nyle Tinnie Fuller, NP  sertraline  (ZOLOFT ) 25 MG tablet TAKE 1 TABLET (25 MG TOTAL) BY MOUTH DAILY. 12/26/22   Vincente Saber, NP  thiamine  (VITAMIN B1) 100 MG tablet Place 1 tablet (100 mg total) into feeding tube daily. Patient taking differently: Take 100 mg by mouth daily. 05/05/22   Amin, Sumayya, MD  torsemide  (DEMADEX ) 20 MG tablet Take 20 mg by mouth daily. As needed 07/28/22   [provider]  Travoprost , BAK Free, (TRAVATAN ) 0.004 % SOLN ophthalmic solution Place 1 drop into both  eyes at bedtime.    [provider]    Physical Exam: Vitals:   04/29/23 1758  BP: (!) 119/56  Pulse: (!) 102  Resp: 16  Temp: 98.9 F (37.2 C)  SpO2: 100%  Weight: 74.8 kg  Height: 5' 10 (1.778 m)   Physical Exam Vitals and nursing note reviewed.  Constitutional:      General: She is sleeping. She is not in acute distress. HENT:     Head: Normocephalic and atraumatic.  Cardiovascular:     Rate and Rhythm: Normal rate and regular rhythm.     Heart sounds: Normal heart sounds.  Pulmonary:     Effort: Pulmonary effort is normal.     Breath sounds: Normal breath sounds.  Abdominal:     Palpations: Abdomen is soft.     Tenderness: There is no abdominal tenderness.  Musculoskeletal:     Comments: See below  Neurological:     Mental Status: She is easily aroused. Mental status is at baseline.        Labs on Admission: I  have personally reviewed following labs and imaging studies  CBC: Recent Labs  Lab 04/29/23 1801  WBC 11.9*  HGB 8.9*  HCT 28.3*  MCV 95.9  PLT 393   Basic Metabolic Panel: Recent Labs  Lab 04/29/23 1801  NA 139  K 4.8  CL 105  CO2 23  GLUCOSE 155*  BUN 69*  CREATININE 2.36*  CALCIUM  9.1   GFR: Estimated Creatinine Clearance: 24.7 mL/min (A) (by C-G formula based on SCr of 2.36 mg/dL (H)). Liver Function Tests: No results for input(s): AST, ALT, ALKPHOS, BILITOT, PROT, ALBUMIN in the last 168 hours. No results for input(s): LIPASE, AMYLASE in the last 168 hours. No results for input(s): AMMONIA in the last 168 hours. Coagulation Profile: No results for input(s): INR, PROTIME in the last 168 hours. Cardiac Enzymes: No results for input(s): CKTOTAL, CKMB, CKMBINDEX, TROPONINI in the last 168 hours. BNP (last 3 results) No results for input(s): PROBNP in the last 8760 hours. HbA1C: No results for input(s): HGBA1C in the last 72 hours. CBG: No results for input(s): GLUCAP in the last  168 hours. Lipid Profile: No results for input(s): CHOL, HDL, LDLCALC, TRIG, CHOLHDL, LDLDIRECT in the last 72 hours. Thyroid Function Tests: No results for input(s): TSH, T4TOTAL, FREET4, T3FREE, THYROIDAB in the last 72 hours. Anemia Panel: No results for input(s): VITAMINB12, FOLATE, FERRITIN, TIBC, IRON , RETICCTPCT in the last 72 hours. Urine analysis:    Component Value Date/Time   COLORURINE YELLOW (A) 04/29/2023 2006   APPEARANCEUR HAZY (A) 04/29/2023 2006   APPEARANCEUR Cloudy (A) 08/22/2020 1402   LABSPEC 1.001 (L) 04/29/2023 2006   PHURINE 7.0 04/29/2023 2006   GLUCOSEU NEGATIVE 04/29/2023 2006   GLUCOSEU NEGATIVE 10/03/2022 1405   HGBUR SMALL (A) 04/29/2023 2006   BILIRUBINUR NEGATIVE 04/29/2023 2006   BILIRUBINUR negative 01/09/2023 1607   BILIRUBINUR Negative 08/22/2020 1402   KETONESUR NEGATIVE 04/29/2023 2006   PROTEINUR NEGATIVE 04/29/2023 2006   UROBILINOGEN 0.2 01/09/2023 1607   UROBILINOGEN 0.2 10/03/2022 1405   NITRITE NEGATIVE 04/29/2023 2006   LEUKOCYTESUR TRACE (A) 04/29/2023 2006    Radiological Exams on Admission: CT ABDOMEN PELVIS WO CONTRAST Result Date: 04/29/2023 CLINICAL DATA:  Fall emesis EXAM: CT ABDOMEN AND PELVIS WITHOUT CONTRAST TECHNIQUE: Multidetector CT imaging of the abdomen and pelvis was performed following the standard protocol without IV contrast. RADIATION DOSE REDUCTION: This exam was performed according to the departmental dose-optimization program which includes automated exposure control, adjustment of the mA and/or kV according to patient size and/or use of iterative reconstruction technique. COMPARISON:  CT 05/06/2022 FINDINGS: Lower chest: No acute airspace disease or pleural effusion. Calcified granuloma at the right lung base. Mild cardiomegaly Hepatobiliary: No focal liver abnormality is seen. No gallstones, gallbladder wall thickening, or biliary dilatation. Pancreas: Unremarkable. No pancreatic  ductal dilatation or surrounding inflammatory changes. Spleen: Normal in size without focal abnormality. Adrenals/Urinary Tract: Adrenal glands are normal. Kidneys show no hydronephrosis. Extensive intrarenal vascular calcifications. The bladder is unremarkable Stomach/Bowel: Stomach is within normal limits. No evidence of bowel wall thickening, distention, or inflammatory changes. Vascular/Lymphatic: Moderate aortic atherosclerosis. No aneurysm. No suspicious lymph nodes. Reproductive: Uterus and bilateral adnexa are unremarkable. Other: Negative for pelvic effusion or free air. Musculoskeletal: No acute or osseous abnormality. IMPRESSION: 1. No CT evidence for acute intra-abdominal or pelvic abnormality. 2. Aortic atherosclerosis. Aortic Atherosclerosis (ICD10-I70.0). Electronically Signed   By: Luke Bun M.D.   On: 04/29/2023 23:13   DG Foot Complete Right Result Date: 04/29/2023 CLINICAL DATA:  Gangrenous  changes of second toe. Evaluation for osteomyelitis fracture. EXAM: RIGHT FOOT COMPLETE - 3+ VIEW COMPARISON:  None Available. FINDINGS: No acute fracture or dislocation. No evidence of osteomyelitis. Soft tissue swelling about the second toe. Vascular calcifications. IMPRESSION: No acute fracture or evidence of osteomyelitis. Electronically Signed   By: Norman Gatlin M.D.   On: 04/29/2023 22:57   DG Pelvis 1-2 Views Result Date: 04/29/2023 CLINICAL DATA:  Frequent falls EXAM: PELVIS - 1-2 VIEW COMPARISON:  CT abdomen and pelvis 05/06/2022 FINDINGS: No acute fracture or dislocation. Degenerative changes pubic symphysis, both hips, SI joints and lower lumbar spine. IMPRESSION: No acute fracture or dislocation. Electronically Signed   By: Norman Gatlin M.D.   On: 04/29/2023 20:59   DG Chest Portable 1 View Result Date: 04/29/2023 CLINICAL DATA:  Frequent falls. EXAM: PORTABLE CHEST 1 VIEW COMPARISON:  04/25/2022 FINDINGS: Stable cardiomediastinal silhouette. No focal consolidation, pleural effusion,  or pneumothorax. No displaced rib fractures. IMPRESSION: No active disease. Electronically Signed   By: Norman Gatlin M.D.   On: 04/29/2023 20:58   CT Cervical Spine Wo Contrast Result Date: 04/29/2023 CLINICAL DATA:  Neck trauma.  Multiple falls. EXAM: CT CERVICAL SPINE WITHOUT CONTRAST TECHNIQUE: Multidetector CT imaging of the cervical spine was performed without intravenous contrast. Multiplanar CT image reconstructions were also generated. RADIATION DOSE REDUCTION: This exam was performed according to the departmental dose-optimization program which includes automated exposure control, adjustment of the mA and/or kV according to patient size and/or use of iterative reconstruction technique. COMPARISON:  CT of the cervical spine 06/09/2022 FINDINGS: Alignment: Slight degenerative anterolisthesis at C4-5 is stable. No other significant listhesis is present. Chronic straightening of the normal cervical lordosis is unchanged. Skull base and vertebrae: The craniocervical junction is within normal limits. The vertebral body heights are normal. No acute fractures are present. Soft tissues and spinal canal: No prevertebral fluid or swelling. No visible canal hematoma. Disc levels: Uncovertebral and facet hypertrophy lead to mild foraminal narrowing bilaterally at C5-6. No other significant stenosis is present. Upper chest: The lung apices are clear. The thoracic inlet is within normal limits. IMPRESSION: 1. No acute fracture or traumatic subluxation. 2. Mild foraminal narrowing bilaterally at C5-6. Electronically Signed   By: Lonni Necessary M.D.   On: 04/29/2023 19:01   CT HEAD WO CONTRAST ( ) Result Date: 04/29/2023 CLINICAL DATA:  Head trauma.  Fall. EXAM: CT HEAD WITHOUT CONTRAST TECHNIQUE: Contiguous axial images were obtained from the base of the skull through the vertex without intravenous contrast. RADIATION DOSE REDUCTION: This exam was performed according to the departmental dose-optimization  program which includes automated exposure control, adjustment of the mA and/or kV according to patient size and/or use of iterative reconstruction technique. COMPARISON:  CT head without contrast 09/10/2022. FINDINGS: Brain: Moderate generalized atrophy and white matter disease is present bilaterally. No acute infarct, hemorrhage, or mass lesion is present. Ventricular enlargement is stable. Chronic disc proportionate white matter volume loss in the parietal lobes is evident bilaterally. The brainstem and cerebellum are within normal limits. Midline structures are within normal limits. Vascular: Atherosclerotic calcifications are present within the cavernous internal carotid arteries bilaterally. No hyperdense vessel is present. Skull: Calvarium is intact. No focal lytic or blastic lesions are present. No significant extracranial soft tissue lesion is present. Sinuses/Orbits: The paranasal sinuses and mastoid air cells are clear. Bilateral lens replacements are noted. Globes and orbits are otherwise unremarkable. IMPRESSION: 1. No acute intracranial abnormality or significant interval change. 2. Moderate generalized atrophy and white matter  disease likely reflects the sequela of chronic microvascular ischemia. 3. Chronic disproportionate white matter volume loss in the parietal lobes bilaterally. Electronically Signed   By: Lonni Necessary M.D.   On: 04/29/2023 18:59     Data Reviewed: Relevant notes from primary care and specialist visits, past discharge summaries as available in EHR, including Care Everywhere. Prior diagnostic testing as pertinent to current admission diagnoses Updated medications and problem lists for reconciliation ED course, including vitals, labs, imaging, treatment and response to treatment Triage notes, nursing and pharmacy notes and ED provider's notes Notable results as noted in HPI   Assessment and Plan: * Gangrene of toe of right foot (HCC) Diabetic peripheral  neuropathy Will continue Vanco Rocephin   No osteo seen on foot x-ray Podiatry consult and vascular consult Was last seen by podiatry at [facility] around May 2023  Abdominal pain and vomiting History of hematemesis 04/2022, grade D esophagitis on EGD History of dysphagia requiring PEG tube CT abdomen and pelvis nonacute Will get lipase and LFTs IV Protonix , IV antiemetics Will keep n.p.o. overnight GI consult if hemoglobin downtrending  Chronic disease anemia Possible acute anemia Suspect anemia of CKD however given history of hematemesis will trend hemoglobin Anemia panel Consult GI if hemoglobin downtrending  Type 1 diabetes mellitus (HCC) Follows with endocrinology Continue basal insulin  Sliding scale coverage  Chronic kidney disease, stage IV (severe) (HCC) Renal function at baseline Avoid nephrotoxins  Wernicke-Korsakoff syndrome (HCC) Chronic GERD stable encephalopathy Delirium precautions     DVT prophylaxis: SCD until acute anemia ruled out  Consults: Podiatry and vascular  Advance Care Planning:   Code Status: Prior   Family Communication: none  Disposition Plan: Back to previous home environment  Severity of Illness: The appropriate patient status for this patient is INPATIENT. Inpatient status is judged to be reasonable and necessary in order to provide the required intensity of service to ensure the patient's safety. The patient's presenting symptoms, physical exam findings, and initial radiographic and laboratory data in the context of their chronic comorbidities is felt to place them at high risk for further clinical deterioration. Furthermore, it is not anticipated that the patient will be medically stable for discharge from the hospital within 2 midnights of admission.   * I certify that at the point of admission it is my clinical judgment that the patient will require inpatient hospital care spanning beyond 2 midnights from the point of admission due to high  intensity of service, high risk for further deterioration and high frequency of surveillance required.*  Author: Delayne LULLA Solian, MD 04/30/2023 12:15 AM  For on call review [email or web address].

## 2023-04-30 NOTE — Assessment & Plan Note (Signed)
 Renal function at baseline Avoid nephrotoxins

## 2023-04-30 NOTE — Assessment & Plan Note (Addendum)
 Follows with endocrinology Continue basal insulin Sliding scale coverage

## 2023-04-30 NOTE — Consult Note (Signed)
 PODIATRY CONSULTATION  NAME Laurie Lin MRN [id number] DOB 15-Feb-1956 DOA 04/29/2023   Reason for consult:  Chief Complaint  Patient presents with   Fall    Attending/Consulting physician: A. Dezii DO  History of present illness: Laurie Lin is a 68 y.o. female with medical history significant for Warnicke Korsakoff encephalopathy, CKD 4, type 1 diabetes,  hematemesis 04/2022 with EGD showing grade D esophagitis and Mallory-Weiss tear, prior PEG tube dependence, who was brought to the ED with abdominal pain and vomiting, as well as concerns for bleeding from the right second toe.  History taken from ED provider.  Patient is unable to contribute to the history due to chronic encephalopathy. ED course and data reviewed: Tachycardic to 102 with soft BP 119/56 and otherwise normal vitals Notable findings on workup include the following: WBC 11,900, negative respiratory viral panel, trace leukocyte on urinalysis. Creatinine At baseline at 2.36 Hemoglobin 8.9 down from 9.4 couple months prior EKG, personally viewed and interpreted showing sinus tachycardia at 104 nonspecific T wave changes CT head and C-spine nonacute Chest x-ray without active disease Hip x-ray without fracture Right foot x-ray negative for osteomyelitis CT abdomen and pelvis nonacute  Patient states she is here for both abdominal pain which is bad for her as well as the right second toe.  She is not sure when the black discoloration on her right second toe started.  She does not see a podiatrist recently for this.  Happened within the past week she thinks.  She denies pain in the right foot at this time.  Her main concern is chills and abdominal pain.  Patient does report a history of diabetes type 2 with neuropathy.    Past Medical History:  Diagnosis Date   Alcohol abuse    Diabetes mellitus without complication (HCC)    GERD (gastroesophageal reflux disease)    Hypertension    Renal disorder    stage 3     Wernicke-Korsakoff syndrome (alcoholic) (HCC)        Latest Ref Rng & Units 04/30/2023    1:48 AM 04/29/2023    6:01 PM 02/28/2023   12:24 PM  CBC  WBC 4.0 - 10.5 K/uL 8.4  11.9  8.0   Hemoglobin 12.0 - 15.0 g/dL 7.9  8.9  9.4   Hematocrit 36.0 - 46.0 % 24.8  28.3  29.1   Platelets 150 - 400 K/uL 360  393  353        Latest Ref Rng & Units 04/30/2023    1:48 AM 04/29/2023    6:01 PM 02/28/2023   12:24 PM  BMP  Glucose 70 - 99 mg/dL  844  661   BUN 8 - 23 mg/dL  69  64   Creatinine 9.55 - 1.00 mg/dL 7.81  7.63  7.65   Sodium 135 - 145 mmol/L  139  135   Potassium 3.5 - 5.1 mmol/L  4.8  4.4   Chloride 98 - 111 mmol/L  105  102   CO2 22 - 32 mmol/L  23  24   Calcium  8.9 - 10.3 mg/dL  9.1  9.5       Physical Exam: Lower Extremity Exam Vasc: R - PT non palpable, DP non palpable. Cap refill absent to tip of 2nd toe  L - PT non palpable, DP non palpable. Cap refill <3 sec to digits  Derm: R -superficial dry gangrenous changes to the distal aspect of the second toe as well as  the medial aspect of the right hallux.  Does not appear to be significant cellulitis or erythema or edema at this time.  No open draining wounds.  Redness as well as gangrenous changes of the tip of the second toe actually appear improved today from prior pictures in chart   L - Normal temp/texture/turgor with no open lesion or clinical signs of infection  MSK:  R -mild edema of the forefoot  L -  No gross deformities. Compartments soft, non-tender, compressible  Neuro: R - Gross sensation absent. Gross motor function intact   L - Gross sensation absent. Gross motor function intact    ASSESSMENT/PLAN OF CARE 68 y.o. female with PMHx significant for DM2 with neuropathy with superficial gangrenous changes to the distal aspect of the right second toe.  X-ray without evidence of osteomyelitis.  WBC 8.4 CRP 2.3 ESR 56 MRI right foot: Pending  -Ordered MRI of the right foot to evaluate for osteomyelitis of  the second toe as she does have mild to moderate elevated inflammatory markers.  No obvious apparent indication for surgical intervention at this time will reassess after MRI completed. -Ordered ABI/PVR studies and vascular has been consulted to assess for PVD appreciate their recommendations -Okay to continue IV abx broad spectrum for now - Anticoagulation: Per primary okay to continue - Wound care: None needed at current time - WB status: Weightbearing as tolerated bilateral lower extremity - Will continue to follow   Thank you for the consult.  Please contact me directly with any questions or concerns.           Marolyn JULIANNA Honour, DPM [facility]    [address] [place], [place] [address]                Office [phone]  Fax [phone]

## 2023-04-30 NOTE — Progress Notes (Signed)
 Pharmacy Antibiotic Note  Laurie Lin is a 68 y.o. female admitted on 04/29/2023 with cellulitis.  Pharmacy has been consulted for Vancomycin  dosing.  Plan: Pt given Vancomycin  1750 mg once. Vancomycin  1000 mg IV Q 36 hrs. Goal AUC 400-550. Expected AUC: 497.6 SCr used: 2.36  Pharmacy will continue to follow and will adjust abx dosing whenever warranted.  Temp (24hrs), Avg:98.9 F (37.2 C), Min:98.9 F (37.2 C), Max:98.9 F (37.2 C)   Recent Labs  Lab 04/29/23 1801  WBC 11.9*  CREATININE 2.36*    Estimated Creatinine Clearance: 24.7 mL/min (A) (by C-G formula based on SCr of 2.36 mg/dL (H)).    Allergies  Allergen Reactions   Ibuprofen Nausea Only   Nsaids     Ulcerative stomach and small intestines   Brimonidine Itching    Red, itchy, sensitivity to light Red, itchy, sensitivity to light    Latex Itching    Other reaction(s): Other (see comments)    Antimicrobials this admission: 01/08 Ceftriaxone  >>  01/08 Vancomycin  >>  01/08 Flagyl  >> x 1 dose  Microbiology results: 01/08 UCx: Pending   Thank you for allowing pharmacy to be a part of this patient's care.  Rankin CANDIE Dills, PharmD, [facility] 04/30/2023 12:47 AM

## 2023-04-30 NOTE — ED Notes (Signed)
 Beola Cord, NT assisting patient with eating breakfast.

## 2023-04-30 NOTE — Assessment & Plan Note (Addendum)
 Diabetic peripheral neuropathy Will continue Vanco Rocephin  No osteo seen on foot x-ray Podiatry consult and vascular consult Was last seen by podiatry at [facility] around May 2023

## 2023-04-30 NOTE — ED Notes (Signed)
Patient self removed IV.

## 2023-04-30 NOTE — Assessment & Plan Note (Signed)
 Chronic GERD stable encephalopathy Delirium precautions

## 2023-04-30 NOTE — Hospital Course (Signed)
 Laurie Lin

## 2023-04-30 NOTE — ED Notes (Signed)
 This RN received report for assigned room at 1900. Previous documentation overdue by approx 1-2hrs incomplete due to this RN not being here during due times. Per handoff, this patient has a room upstairs and is awaiting for it to be clean for transport to new bed assignment for admission.

## 2023-04-30 NOTE — Assessment & Plan Note (Addendum)
 Possible acute anemia Suspect anemia of CKD however given history of hematemesis will trend hemoglobin Anemia panel Consult GI if hemoglobin downtrending

## 2023-04-30 NOTE — Assessment & Plan Note (Signed)
 History of hematemesis 04/2022, grade D esophagitis on EGD History of dysphagia requiring PEG tube CT abdomen and pelvis nonacute Will get lipase and LFTs IV Protonix, IV antiemetics Will keep n.p.o. overnight GI consult if hemoglobin downtrending

## 2023-04-30 NOTE — Consult Note (Signed)
 Hospital Consult    Reason for Consult:  Right Second toe bleeding  Requesting Physician:  Delayne Solian MD  MRN #:  [id number]  History of Present Illness: This is a 68 y.o. female with medical history significant for Warnicke Korsakoff encephalopathy, CKD 4, type 1 diabetes,  hematemesis 04/2022 with EGD showing grade D esophagitis and Mallory-Weiss tear, prior PEG tube dependence, who was brought to the ED with abdominal pain and vomiting, as well as concerns for bleeding from the right second toe.  History taken from ED provider.  Patient is unable to contribute to the history due to chronic encephalopathy.   On exam today the patient is resting comfortably in a stretcher in the hallway in the emergency department.  Patient's right foot shows some gangrenous changes to her medial great toe as well as the second toe.  Patient was unable to give history or any qualifying circumstances that may have related to this.  Patient does endorse history of diabetes type 1 and neuropathy for 20 years.  Vascular surgery consulted to evaluate.  Past Medical History:  Diagnosis Date   Alcohol abuse    Diabetes mellitus without complication (HCC)    GERD (gastroesophageal reflux disease)    Hypertension    Renal disorder    stage 3    Wernicke-Korsakoff syndrome (alcoholic) (HCC)     Past Surgical History:  Procedure Laterality Date   DILATION AND CURETTAGE OF UTERUS     Miscarriage   ESOPHAGOGASTRODUODENOSCOPY (EGD) WITH PROPOFOL  N/A 05/22/2022   Procedure: ESOPHAGOGASTRODUODENOSCOPY (EGD) WITH PROPOFOL ;  Surgeon: Toledo, Ladell POUR, MD;  Location: [facility] ENDOSCOPY;  Service: Gastroenterology;  Laterality: N/A;   EYE SURGERY     bilateral caterac   FINGER SURGERY     IR GASTROSTOMY TUBE MOD SED  04/29/2022   IR RADIOLOGIST EVAL & MGMT  08/01/2022   IR REPLC GASTRO/COLONIC TUBE PERCUT W/FLUORO  06/04/2022   KNEE ARTHROSCOPY Left 1989   [facility]   PILONIDAL CYST DRAINAGE      Allergies  Allergen  Reactions   Ibuprofen Nausea Only   Nsaids     Ulcerative stomach and small intestines   Brimonidine Itching    Red, itchy, sensitivity to light Red, itchy, sensitivity to light    Latex Itching    Other reaction(s): Other (see comments)    Prior to Admission medications   Medication Sig Start Date End Date Taking? Authorizing Provider  acetaminophen  (TYLENOL ) 500 MG tablet Place 1 tablet (500 mg total) into feeding tube 2 (two) times daily as needed for mild pain, fever or headache. Patient taking differently: Take 500 mg by mouth 2 (two) times daily as needed for mild pain (pain score 1-3), fever or headache. 05/26/22   Josette Ade, MD  calcium  carbonate (TUMS EX) 750 MG chewable tablet Chew 1 tablet by mouth as needed for heartburn. As needed    [provider]  cholecalciferol  (CHOLECALCIFEROL ) 25 MCG tablet Place 2 tablets (2,000 Units total) into feeding tube daily. Patient taking differently: Take 2,000 Units by mouth daily. 05/12/22   Awanda City, MD  Continuous Glucose Sensor (DEXCOM G7 SENSOR) MISC 1 Application by Does not apply route every 14 (fourteen) days.    [provider]  cyanocobalamin  1000 MCG tablet Place 1 tablet (1,000 mcg total) into feeding tube daily. Patient taking differently: Take 1,000 mcg by mouth daily. 05/06/22   Caleen Qualia, MD  folic acid  (FOLVITE ) 1 MG tablet Place 1 tablet (1 mg total) into feeding tube  daily. Patient taking differently: Take 1 mg by mouth daily. 05/05/22 05/05/23  Amin, Sumayya, MD  Insulin  Glargine (BASAGLAR  KWIKPEN) 100 UNIT/ML Inject 8 Units into the skin at bedtime. 05/26/22   Josette Ade, MD  insulin  lispro (HUMALOG KWIKPEN) 100 UNIT/ML KwikPen INJECT AS PER SLIDING SCALE IF 0-150=0 LESS THAN 60 NOTIFY NP/MD 151-200=2UNITS,201-250=3 UNITS,251-300=5 UNITS,301-350=7 UNITS,351-400=9 UNITS GREATER THAN 401 CALL NP/MD 08/25/22   Vincente Saber, NP  insulin  lispro (HUMALOG) 100 UNIT/ML injection Inject 0-9 Units  into the skin 3 (three) times daily before meals. Sliding scale per Endocrinology    [provider]  Insulin  Pen Needle 32G X 4 MM MISC ok to sub any brand or size needle preferred by insurance/patient, use up to 5x/day, dx E10.65 03/02/18   [provider]  lisinopril  (ZESTRIL ) 2.5 MG tablet Take 2.5 mg by mouth daily. 07/28/22   [provider]  Multiple Vitamin (MULTIVITAMIN WITH MINERALS) TABS tablet Place 1 tablet into feeding tube daily. Patient taking differently: Take 1 tablet by mouth daily. 05/12/22   Awanda City, MD  Netarsudil  Dimesylate (RHOPRESSA ) 0.02 % SOLN Place 1 drop into both eyes every evening.    [provider]  [facility] DELICA LANCETS 33G MISC 1 Device by Does not apply route 4 (four) times daily. 05/20/17   Nyle Tinnie Fuller, NP  sertraline  (ZOLOFT ) 25 MG tablet TAKE 1 TABLET (25 MG TOTAL) BY MOUTH DAILY. 12/26/22   Kaur, Charanpreet, NP  thiamine  (VITAMIN B1) 100 MG tablet Place 1 tablet (100 mg total) into feeding tube daily. Patient taking differently: Take 100 mg by mouth daily. 05/05/22   Caleen Qualia, MD  torsemide  (DEMADEX ) 20 MG tablet Take 20 mg by mouth daily. As needed 07/28/22   [provider]  Travoprost , BAK Free, (TRAVATAN ) 0.004 % SOLN ophthalmic solution Place 1 drop into both eyes at bedtime.    [provider]    Social History   Socioeconomic History   Marital status: Married    Spouse name: Not on file   Number of children: Not on file   Years of education: Not on file   Highest education level: 12th grade  Occupational History   Not on file  Tobacco Use   Smoking status: Never   Smokeless tobacco: Never  Vaping Use   Vaping status: Never Used  Substance and Sexual Activity   Alcohol use: Not on file    Comment: no   Drug use: No   Sexual activity: Yes  Other Topics Concern   Not on file  Social History Narrative   Not on file   Social Drivers of Health   Financial Resource Strain:  Low Risk  (02/11/2023)   Overall Financial Resource Strain (CARDIA)    Difficulty of Paying Living Expenses: Not hard at all  Food Insecurity: No Food Insecurity (02/11/2023)   Hunger Vital Sign    Worried About Running Out of Food in the Last Year: Never true    Ran Out of Food in the Last Year: Never true  Transportation Needs: No Transportation Needs (02/11/2023)   PRAPARE - Administrator, Civil Service (Medical): No    Lack of Transportation (Non-Medical): No  Physical Activity: Unknown (02/11/2023)   Exercise Vital Sign    Days of Exercise per Week: 0 days    Minutes of Exercise per Session: Not on file  Stress: Stress Concern Present (02/11/2023)   [employer] of Occupational Health - Occupational Stress Questionnaire    Feeling  of Stress : Rather much  Social Connections: Moderately Isolated (02/11/2023)   Social Connection and Isolation Panel [NHANES]    Frequency of Communication with Friends and Family: More than three times a week    Frequency of Social Gatherings with Friends and Family: More than three times a week    Attends Religious Services: Never    Database Administrator or Organizations: No    Attends Engineer, Structural: Not on file    Marital Status: Married  Catering Manager Violence: Not At Risk (05/09/2022)   Humiliation, Afraid, Rape, and Kick questionnaire    Fear of Current or Ex-Partner: No    Emotionally Abused: No    Physically Abused: No    Sexually Abused: No     Family History  Problem Relation Age of Onset   Depression Mother    Diabetes Mother     ROS: Otherwise negative unless mentioned in HPI  Physical Examination  Vitals:   04/30/23 0544 04/30/23 0638  BP:  (!) 149/88  Pulse:  90  Resp:  16  Temp: 99.7 F (37.6 C)   SpO2:  95%   Body mass index is 23.68 kg/m.  General:  WDWN in NAD Gait: Not observed HENT: WNL, normocephalic Pulmonary: normal non-labored breathing, without Rales, rhonchi,   wheezing Cardiac: regular, without  Murmurs, rubs or gallops; without carotid bruits Abdomen: Positive bowel sounds throughout, soft, NT/ND, no masses Skin: without rashes Vascular Exam/Pulses: Bilateral lower extremities warm to touch.  Doppler pulses bilaterally DP/PT.  Gangrenous changes to the right great toe medial side and right second toe. Extremities: with ischemic changes, with Gangrene , without cellulitis; without open wounds;  Musculoskeletal: no muscle wasting or atrophy  Neurologic: A&O X 3;  No focal weakness or paresthesias are detected; speech is slow Psychiatric:  The pt has Abnormal- flat  affect.  History of encephalopathy slow speech. Lymph:  Unremarkable  CBC    Component Value Date/Time   WBC 8.4 04/30/2023 0148   RBC 2.54 (L) 04/30/2023 0148   RBC 2.58 (L) 04/30/2023 0148   HGB 7.9 (L) 04/30/2023 0148   HCT 24.8 (L) 04/30/2023 0148   PLT 360 04/30/2023 0148   MCV 96.1 04/30/2023 0148   MCH 30.6 04/30/2023 0148   MCHC 31.9 04/30/2023 0148   RDW 13.2 04/30/2023 0148   LYMPHSABS 2.1 09/10/2022 1530   MONOABS 0.8 09/10/2022 1530   EOSABS 0.2 09/10/2022 1530   BASOSABS 0.1 09/10/2022 1530    BMET    Component Value Date/Time   NA 139 04/29/2023 1801   NA 142 03/29/2019 0000   K 4.8 04/29/2023 1801   CL 105 04/29/2023 1801   CO2 23 04/29/2023 1801   GLUCOSE 155 (H) 04/29/2023 1801   BUN 69 (H) 04/29/2023 1801   BUN 12 03/29/2019 0000   CREATININE 2.18 (H) 04/30/2023 0148   CALCIUM  9.1 04/29/2023 1801   GFRNONAA 24 (L) 04/30/2023 0148   GFRAA 18 (L) 03/27/2017 0449    COAGS: Lab Results  Component Value Date   INR 1.0 05/28/2022   INR 1.1 05/22/2022   INR 1.0 05/06/2022     Non-Invasive Vascular Imaging:   EXAM:04/29/23 RIGHT FOOT COMPLETE - 3+ VIEW   COMPARISON:  None Available.   FINDINGS: No acute fracture or dislocation. No evidence of osteomyelitis. Soft tissue swelling about the second toe. Vascular calcifications.    IMPRESSION: No acute fracture or evidence of osteomyelitis.    Statin:  No. Beta Blocker:  No. Aspirin:  No. ACEI:  No. ARB:  No. CCB use:  No Other antiplatelets/anticoagulants:  No.    ASSESSMENT/PLAN: This is a 68 y.o. female who presents to [facility] emergency department with abdominal pain and vomiting as well as concerns for bleeding from her right second toe.  Plan Vascular surgery plans on taking the patient to the vascular lab on 05/01/2023 for right lower extremity angiogram with possible intervention.  I had a long detailed discussion at the bedside today with the patient and her daughter regarding the procedure, benefits, risk, and complications.  Both verbalized her understanding wish to proceed as soon as possible.  I answered all her questions this afternoon.  Patient will be made n.p.o. after midnight for procedure tomorrow. -  I discussed the plan in detail with Dr. [name] MD and he agrees with the plan.   Gwendlyn JONELLE Shank [facility] 04/30/2023 7:43 AM

## 2023-04-30 NOTE — Progress Notes (Signed)
 PROGRESS NOTE    [name]  FMW:[id number] DOB: December 09, 1955 DOA: 04/29/2023 PCP: Laurie Saber, NP  Chief Complaint  Patient presents with   [facility] Course:  Laurie Lin is 68 y.o. female with prior diagnosis of Warnicke Korsakoff encephalopathy, CKD stage IV, type 1 diabetes, recent admission for Mallory-Weiss tear and grade D esophagitis seen on EGD.  She is brought to the ED on this admission with abdominal pain and vomiting as well as concerns of bleeding from the right second toe.  Patient is unable to provide history due to her chronic encephalopathy.  In the ED she was found to have be tachycardic, otherwise normal vital signs.  WBC 11.9, negative respiratory viral panel, CT head and C-spine without acute changes.  Chest x-ray without active disease.  Hip x-ray without fracture.  Right foot x-ray negative for signs of osteomyelitis.  CT abdomen pelvis without acute changes.  She was started on broad-spectrum antibiotics for presumed cellulitis/wound infection and admitted to [facility] service.   Subjective: On evaluation this morning patient is pleasantly confused.  Her husband is at bedside.  She has no acute complaints.   Objective: Vitals:   04/29/23 1758 04/30/23 0152 04/30/23 0544 04/30/23 0638  BP: (!) 119/56 120/88  (!) 149/88  Pulse: (!) 102 90  90  Resp: 16 16  16   Temp: 98.9 F (37.2 C) 99.7 F (37.6 C) 99.7 F (37.6 C)   TempSrc:  Oral Oral   SpO2: 100% 96%  95%  Weight: 74.8 kg     Height: 5' 10 (1.778 m)       Intake/Output Summary (Last 24 hours) at 04/30/2023 0810 Last data filed at 04/30/2023 0443 Gross per 24 hour  Intake 1050 ml  Output --  Net 1050 ml   Filed Weights   04/29/23 1758  Weight: 74.8 kg    Examination: General exam: Appears calm and comfortable, NAD  Respiratory system: No work of breathing, symmetric chest wall expansion Cardiovascular system: S1 & S2 heard, RRR.  Gastrointestinal system: Abdomen is nondistended,  soft and nontender.  Neuro: Alert and oriented. No focal neurological deficits. Skin: Gangrenous changes to the second toe of right foot, thickened skin, flaking at distal endpoints, thick toenails, black discoloration of almost entire right toe.  No significant surrounding erythema.  No active oozing or bleeding. Psychiatry: Demonstrates appropriate judgement and insight. Mood & affect appropriate for situation.   Assessment & Plan:  Principal Problem:   Gangrene of toe of right foot (HCC) Active Problems:   History of hematemesis   History of hematemesis, grade D esophagitis on EGD 04/2022   Abdominal pain and vomiting   Chronic disease anemia   Diabetic peripheral neuropathy (HCC)   Type 1 diabetes mellitus (HCC)   Chronic kidney disease, stage IV (severe) (HCC)   Wernicke-Korsakoff syndrome (HCC)    Green right foot second toe Diabetic peripheral neuropathy - MRI foot obtained: No obvious osteomyelitis.  Possible osteonecrosis early stages. - Elevated sed rate - ABI studies pending. - Weightbearing as tolerated - Continue with vancomycin  and Rocephin  for now.  MRSA PCR pending de-escalate when able. - Podiatry consulted - Vascular consulted in ED - Was previously seeing podiatry [facility] in [facility]23  Abdominal pain and vomiting - History of Mallory-Weiss tear 1 yr ago - Known grade D esophagitis - History of dysphagia requiring PEG tube CT abdomen pelvis nonacute - LFTs within normal limits. - Lipase unremarkable - Currently on IV Protonix  and  antiemetics - Will keep n.p.o. for now - Hemoglobin appears to be downtrending, repeat CBC pending  Acute on chronic anemia - Anemia of CKD, unclear baseline.  Hemoglobin downtrending now likely due to acute blood loss from foot.  Possible GI etiology given history - If hemoglobin continues to downtrend will consider GI consultation  Type 1 diabetes, uncontrolled - Hemoglobin A1c 9.2% - Follows with endocrinology outpatient -  Continue with basal bolus insulin  for now, titrate up as tolerated  CKD stage IV - Renal function appears to be at her baseline - Avoid nephrotoxic meds when possible.  Renally dose when needed  Warnicke Korsakoff syndrome - Continue frequent reorientation, delirium precautions - Discussed care with husband.  DVT prophylaxis: Open   Code Status: Full Code Family Communication: Discussed with husband who is at bedside. Disposition:  Status is: Inpatient, will obtain PT/OT evals.  Anticipate patient will require SNF at discharge  Consultants:    Treatment Team:  Consulting Physician: Malvin Marsa FALCON, DPM  Procedures:    Antimicrobials:  Anti-infectives (From admission, onward)    Start     Dose/Rate Route Frequency Ordered Stop   05/01/23 1200  vancomycin  (VANCOCIN ) IVPB 1000 mg/200 mL premix        1,000 mg 200 mL/hr over 60 Minutes Intravenous Every 36 hours 04/30/23 0218     04/30/23 2200  cefTRIAXone  (ROCEPHIN ) 1 g in sodium chloride  0.9 % 100 mL IVPB        1 g 200 mL/hr over 30 Minutes Intravenous Every 24 hours 04/30/23 0031     04/29/23 2345  vancomycin  (VANCOREADY) IVPB 1750 mg/350 mL        1,750 mg 175 mL/hr over 120 Minutes Intravenous  Once 04/29/23 2334 04/30/23 0443   04/29/23 2330  metroNIDAZOLE  (FLAGYL ) IVPB 500 mg        500 mg 100 mL/hr over 60 Minutes Intravenous  Once 04/29/23 2316 04/30/23 0108   04/29/23 2145  cefTRIAXone  (ROCEPHIN ) 1 g in sodium chloride  0.9 % 100 mL IVPB        1 g 200 mL/hr over 30 Minutes Intravenous  Once 04/29/23 2143 04/29/23 2349       Data Reviewed: I have personally reviewed following labs and imaging studies CBC: Recent Labs  Lab 04/29/23 1801 04/30/23 0148  WBC 11.9* 8.4  HGB 8.9* 7.9*  HCT 28.3* 24.8*  MCV 95.9 96.1  PLT 393 360   Basic Metabolic Panel: Recent Labs  Lab 04/29/23 1801 04/30/23 0148  NA 139  --   K 4.8  --   CL 105  --   CO2 23  --   GLUCOSE 155*  --   BUN 69*  --   CREATININE  2.36* 2.18*  CALCIUM  9.1  --    GFR: Estimated Creatinine Clearance: 26.7 mL/min (A) (by C-G formula based on SCr of 2.18 mg/dL (H)). Liver Function Tests: Recent Labs  Lab 04/30/23 0148  AST 16  ALT 13  ALKPHOS 40  BILITOT 0.7  PROT 6.6  ALBUMIN 3.2*   CBG: Recent Labs  Lab 04/30/23 0748  GLUCAP 159*    Recent Results (from the past 240 hours)  Resp panel by RT-PCR (RSV, Flu A&B, Covid) Anterior Nasal Swab     Status: None   Collection Time: 04/29/23  8:47 PM   Specimen: Anterior Nasal Swab  Result Value Ref Range Status   SARS Coronavirus 2 by RT PCR NEGATIVE NEGATIVE Final    Comment: (NOTE) SARS-CoV-2 target nucleic  acids are NOT DETECTED.  The SARS-CoV-2 RNA is generally detectable in upper respiratory specimens during the acute phase of infection. The lowest concentration of SARS-CoV-2 viral copies this assay can detect is 138 copies/mL. A negative result does not preclude SARS-Cov-2 infection and should not be used as the sole basis for treatment or other patient management decisions. A negative result may occur with  improper specimen collection/handling, submission of specimen other than nasopharyngeal swab, presence of viral mutation(s) within the areas targeted by this assay, and inadequate number of viral copies(<138 copies/mL). A negative result must be combined with clinical observations, patient history, and epidemiological information. The expected result is Negative.  Fact Sheet for Patients:  bloggercourse.com  Fact Sheet for Healthcare Providers:  seriousbroker.it  This test is no t yet approved or cleared by the [place]  FDA and  has been authorized for detection and/or diagnosis of SARS-CoV-2 by FDA under an Emergency Use Authorization (EUA). This EUA will remain  in effect (meaning this test can be used) for the duration of the COVID-19 declaration under Section 564(b)(1) of the Act,  21 U.S.C.section 360bbb-3(b)(1), unless the authorization is terminated  or revoked sooner.       Influenza A by PCR NEGATIVE NEGATIVE Final   Influenza B by PCR NEGATIVE NEGATIVE Final    Comment: (NOTE) The Xpert Xpress SARS-CoV-2/FLU/RSV plus assay is intended as an aid in the diagnosis of influenza from Nasopharyngeal swab specimens and should not be used as a sole basis for treatment. Nasal washings and aspirates are unacceptable for Xpert Xpress SARS-CoV-2/FLU/RSV testing.  Fact Sheet for Patients: bloggercourse.com  Fact Sheet for Healthcare Providers: seriousbroker.it  This test is not yet approved or cleared by the [place]  FDA and has been authorized for detection and/or diagnosis of SARS-CoV-2 by FDA under an Emergency Use Authorization (EUA). This EUA will remain in effect (meaning this test can be used) for the duration of the COVID-19 declaration under Section 564(b)(1) of the Act, 21 U.S.C. section 360bbb-3(b)(1), unless the authorization is terminated or revoked.     Resp Syncytial Virus by PCR NEGATIVE NEGATIVE Final    Comment: (NOTE) Fact Sheet for Patients: bloggercourse.com  Fact Sheet for Healthcare Providers: seriousbroker.it  This test is not yet approved or cleared by the [place]  FDA and has been authorized for detection and/or diagnosis of SARS-CoV-2 by FDA under an Emergency Use Authorization (EUA). This EUA will remain in effect (meaning this test can be used) for the duration of the COVID-19 declaration under Section 564(b)(1) of the Act, 21 U.S.C. section 360bbb-3(b)(1), unless the authorization is terminated or revoked.  Performed at [facility], [address] [place], [place] [address]      Radiology Studies: CT ABDOMEN PELVIS WO CONTRAST Result Date: 04/29/2023 CLINICAL DATA:  Fall emesis EXAM: CT ABDOMEN AND  PELVIS WITHOUT CONTRAST TECHNIQUE: Multidetector CT imaging of the abdomen and pelvis was performed following the standard protocol without IV contrast. RADIATION DOSE REDUCTION: This exam was performed according to the departmental dose-optimization program which includes automated exposure control, adjustment of the mA and/or kV according to patient size and/or use of iterative reconstruction technique. COMPARISON:  CT 05/06/2022 FINDINGS: Lower chest: No acute airspace disease or pleural effusion. Calcified granuloma at the right lung base. Mild cardiomegaly Hepatobiliary: No focal liver abnormality is seen. No gallstones, gallbladder wall thickening, or biliary dilatation. Pancreas: Unremarkable. No pancreatic ductal dilatation or surrounding inflammatory changes. Spleen: Normal in size without focal abnormality. Adrenals/Urinary Tract: Adrenal glands  are normal. Kidneys show no hydronephrosis. Extensive intrarenal vascular calcifications. The bladder is unremarkable Stomach/Bowel: Stomach is within normal limits. No evidence of bowel wall thickening, distention, or inflammatory changes. Vascular/Lymphatic: Moderate aortic atherosclerosis. No aneurysm. No suspicious lymph nodes. Reproductive: Uterus and bilateral adnexa are unremarkable. Other: Negative for pelvic effusion or free air. Musculoskeletal: No acute or osseous abnormality. IMPRESSION: 1. No CT evidence for acute intra-abdominal or pelvic abnormality. 2. Aortic atherosclerosis. Aortic Atherosclerosis (ICD10-I70.0). Electronically Signed   By: Luke Bun M.D.   On: 04/29/2023 23:13   DG Foot Complete Right Result Date: 04/29/2023 CLINICAL DATA:  Gangrenous changes of second toe. Evaluation for osteomyelitis fracture. EXAM: RIGHT FOOT COMPLETE - 3+ VIEW COMPARISON:  None Available. FINDINGS: No acute fracture or dislocation. No evidence of osteomyelitis. Soft tissue swelling about the second toe. Vascular calcifications. IMPRESSION: No acute  fracture or evidence of osteomyelitis. Electronically Signed   By: Norman Gatlin M.D.   On: 04/29/2023 22:57   DG Pelvis 1-2 Views Result Date: 04/29/2023 CLINICAL DATA:  Frequent falls EXAM: PELVIS - 1-2 VIEW COMPARISON:  CT abdomen and pelvis 05/06/2022 FINDINGS: No acute fracture or dislocation. Degenerative changes pubic symphysis, both hips, SI joints and lower lumbar spine. IMPRESSION: No acute fracture or dislocation. Electronically Signed   By: Norman Gatlin M.D.   On: 04/29/2023 20:59   DG Chest Portable 1 View Result Date: 04/29/2023 CLINICAL DATA:  Frequent falls. EXAM: PORTABLE CHEST 1 VIEW COMPARISON:  04/25/2022 FINDINGS: Stable cardiomediastinal silhouette. No focal consolidation, pleural effusion, or pneumothorax. No displaced rib fractures. IMPRESSION: No active disease. Electronically Signed   By: Norman Gatlin M.D.   On: 04/29/2023 20:58   CT Cervical Spine Wo Contrast Result Date: 04/29/2023 CLINICAL DATA:  Neck trauma.  Multiple falls. EXAM: CT CERVICAL SPINE WITHOUT CONTRAST TECHNIQUE: Multidetector CT imaging of the cervical spine was performed without intravenous contrast. Multiplanar CT image reconstructions were also generated. RADIATION DOSE REDUCTION: This exam was performed according to the departmental dose-optimization program which includes automated exposure control, adjustment of the mA and/or kV according to patient size and/or use of iterative reconstruction technique. COMPARISON:  CT of the cervical spine 06/09/2022 FINDINGS: Alignment: Slight degenerative anterolisthesis at C4-5 is stable. No other significant listhesis is present. Chronic straightening of the normal cervical lordosis is unchanged. Skull base and vertebrae: The craniocervical junction is within normal limits. The vertebral body heights are normal. No acute fractures are present. Soft tissues and spinal canal: No prevertebral fluid or swelling. No visible canal hematoma. Disc levels: Uncovertebral and  facet hypertrophy lead to mild foraminal narrowing bilaterally at C5-6. No other significant stenosis is present. Upper chest: The lung apices are clear. The thoracic inlet is within normal limits. IMPRESSION: 1. No acute fracture or traumatic subluxation. 2. Mild foraminal narrowing bilaterally at C5-6. Electronically Signed   By: Lonni Necessary M.D.   On: 04/29/2023 19:01   CT HEAD WO CONTRAST ( ) Result Date: 04/29/2023 CLINICAL DATA:  Head trauma.  Fall. EXAM: CT HEAD WITHOUT CONTRAST TECHNIQUE: Contiguous axial images were obtained from the base of the skull through the vertex without intravenous contrast. RADIATION DOSE REDUCTION: This exam was performed according to the departmental dose-optimization program which includes automated exposure control, adjustment of the mA and/or kV according to patient size and/or use of iterative reconstruction technique. COMPARISON:  CT head without contrast 09/10/2022. FINDINGS: Brain: Moderate generalized atrophy and white matter disease is present bilaterally. No acute infarct, hemorrhage, or mass lesion is present. Ventricular enlargement  is stable. Chronic disc proportionate white matter volume loss in the parietal lobes is evident bilaterally. The brainstem and cerebellum are within normal limits. Midline structures are within normal limits. Vascular: Atherosclerotic calcifications are present within the cavernous internal carotid arteries bilaterally. No hyperdense vessel is present. Skull: Calvarium is intact. No focal lytic or blastic lesions are present. No significant extracranial soft tissue lesion is present. Sinuses/Orbits: The paranasal sinuses and mastoid air cells are clear. Bilateral lens replacements are noted. Globes and orbits are otherwise unremarkable. IMPRESSION: 1. No acute intracranial abnormality or significant interval change. 2. Moderate generalized atrophy and white matter disease likely reflects the sequela of chronic microvascular  ischemia. 3. Chronic disproportionate white matter volume loss in the parietal lobes bilaterally. Electronically Signed   By: Lonni Necessary M.D.   On: 04/29/2023 18:59    Scheduled Meds:  vitamin D3  2,000 Units Oral Daily   cyanocobalamin   1,000 mcg Oral Daily   folic acid   1 mg Oral Daily   heparin   5,000 Units Subcutaneous Q8H   insulin  aspart  0-5 Units Subcutaneous QHS   insulin  aspart  0-9 Units Subcutaneous TID WC   insulin  glargine-yfgn  8 Units Subcutaneous QHS   multivitamin with minerals  1 tablet Oral Daily   pantoprazole  (PROTONIX ) IV  40 mg Intravenous Q24H   sertraline   25 mg Oral Daily   thiamine   100 mg Oral Daily   Continuous Infusions:  cefTRIAXone  (ROCEPHIN )  IV     [START ON 05/01/2023] vancomycin        LOS: 1 day    Time spent:   Doc Mandala, DO [facility]  To contact the attending physician between 7A-7P please use Epic Chat. To contact the covering physician during after hours 7P-7A, please review Amion.   04/30/2023, 8:10 AM   *This document has been created with the assistance of dictation software. Please excuse typographical errors. *

## 2023-05-01 ENCOUNTER — Encounter
Admission: EM | Disposition: A | Discharge status: Home Health | Payer: Self-pay | Source: Home / Self Care | Attending: Family Medicine

## 2023-05-01 ENCOUNTER — Encounter: Payer: Self-pay | Admitting: Vascular Surgery

## 2023-05-01 DIAGNOSIS — I96 Gangrene, not elsewhere classified: Secondary | ICD-10-CM | POA: Diagnosis not present

## 2023-05-01 DIAGNOSIS — I70261 Atherosclerosis of native arteries of extremities with gangrene, right leg: Secondary | ICD-10-CM | POA: Diagnosis not present

## 2023-05-01 HISTORY — PX: LOWER EXTREMITY ANGIOGRAPHY: CATH118251

## 2023-05-01 LAB — CBC WITH DIFFERENTIAL/PLATELET
Abs Immature Granulocytes: 0.03 10*3/uL (ref 0.00–0.07)
Basophils Absolute: 0 10*3/uL (ref 0.0–0.1)
Basophils Relative: 0 %
Eosinophils Absolute: 0.3 10*3/uL (ref 0.0–0.5)
Eosinophils Relative: 4 %
HCT: 25.4 % — ABNORMAL LOW (ref 36.0–46.0)
Hemoglobin: 8.3 g/dL — ABNORMAL LOW (ref 12.0–15.0)
Immature Granulocytes: 0 %
Lymphocytes Relative: 25 %
Lymphs Abs: 2.1 10*3/uL (ref 0.7–4.0)
MCH: 30.2 pg (ref 26.0–34.0)
MCHC: 32.7 g/dL (ref 30.0–36.0)
MCV: 92.4 fL (ref 80.0–100.0)
Monocytes Absolute: 1.6 10*3/uL — ABNORMAL HIGH (ref 0.1–1.0)
Monocytes Relative: 19 %
Neutro Abs: 4.5 10*3/uL (ref 1.7–7.7)
Neutrophils Relative %: 52 %
Platelets: 380 10*3/uL (ref 150–400)
RBC: 2.75 MIL/uL — ABNORMAL LOW (ref 3.87–5.11)
RDW: 13.3 % (ref 11.5–15.5)
WBC: 8.6 10*3/uL (ref 4.0–10.5)
nRBC: 0 % (ref 0.0–0.2)

## 2023-05-01 LAB — GLUCOSE, CAPILLARY
Glucose-Capillary: 105 mg/dL — ABNORMAL HIGH (ref 70–99)
Glucose-Capillary: 148 mg/dL — ABNORMAL HIGH (ref 70–99)
Glucose-Capillary: 233 mg/dL — ABNORMAL HIGH (ref 70–99)
Glucose-Capillary: 182 mg/dL — ABNORMAL HIGH (ref 70–99)

## 2023-05-01 LAB — COMPREHENSIVE METABOLIC PANEL
ALT: 11 U/L (ref 0–44)
AST: 18 U/L (ref 15–41)
Albumin: 3.1 g/dL — ABNORMAL LOW (ref 3.5–5.0)
Alkaline Phosphatase: 41 U/L (ref 38–126)
Anion gap: 10 (ref 5–15)
BUN: 43 mg/dL — ABNORMAL HIGH (ref 8–23)
CO2: 20 mmol/L — ABNORMAL LOW (ref 22–32)
Calcium: 8.7 mg/dL — ABNORMAL LOW (ref 8.9–10.3)
Chloride: 109 mmol/L (ref 98–111)
Creatinine, Ser: 1.64 mg/dL — ABNORMAL HIGH (ref 0.44–1.00)
GFR, Estimated: 34 mL/min — ABNORMAL LOW (ref >60)
Glucose, Bld: 105 mg/dL — ABNORMAL HIGH (ref 70–99)
Potassium: 4.2 mmol/L (ref 3.5–5.1)
Sodium: 139 mmol/L (ref 135–145)
Total Bilirubin: 0.3 mg/dL (ref 0.0–1.2)
Total Protein: 6.4 g/dL — ABNORMAL LOW (ref 6.5–8.1)

## 2023-05-01 SURGERY — LOWER EXTREMITY ANGIOGRAPHY
Anesthesia: Moderate Sedation | Laterality: Right

## 2023-05-01 MED ORDER — FENTANYL CITRATE PF 50 MCG/ML IJ SOSY: 12.5000 ug | PREFILLED_SYRINGE | Freq: Once | INTRAMUSCULAR | Status: DC | PRN

## 2023-05-01 MED ORDER — LORAZEPAM 2 MG/ML IJ SOLN
2.0000 mg | Freq: Once | INTRAMUSCULAR | Status: AC
  Administered 2023-05-01: 2 mg via INTRAVENOUS
  Filled 2023-05-01: qty 1

## 2023-05-01 MED ORDER — FAMOTIDINE 20 MG PO TABS: 40.0000 mg | ORAL_TABLET | Freq: Once | ORAL | Status: DC | PRN

## 2023-05-01 MED ORDER — MIDAZOLAM HCL 5 MG/5ML IJ SOLN
INTRAMUSCULAR | Status: AC
Start: 2023-05-01 — End: ?
  Filled 2023-05-01: qty 5

## 2023-05-01 MED ORDER — DIPHENHYDRAMINE HCL 50 MG/ML IJ SOLN: 50.0000 mg | Freq: Once | INTRAMUSCULAR | Status: DC | PRN

## 2023-05-01 MED ORDER — METHYLPREDNISOLONE SODIUM SUCC 125 MG IJ SOLR: 125.0000 mg | Freq: Once | INTRAMUSCULAR | Status: DC | PRN

## 2023-05-01 MED ORDER — CEFAZOLIN SODIUM-DEXTROSE 2-4 GM/100ML-% IV SOLN: 2.0000 g | INTRAVENOUS | Status: DC

## 2023-05-01 MED ORDER — LIDOCAINE-EPINEPHRINE (PF) 1 %-1:200000 IJ SOLN
INTRAMUSCULAR | Status: DC | PRN
  Administered 2023-05-01: 10 mL

## 2023-05-01 MED ORDER — ENSURE ENLIVE PO LIQD
237.0000 mL | Freq: Two times a day (BID) | ORAL | Status: DC
  Administered 2023-05-03 – 2023-05-05 (×4): 237 mL via ORAL

## 2023-05-01 MED ORDER — FENTANYL CITRATE (PF) 100 MCG/2ML IJ SOLN
INTRAMUSCULAR | Status: DC | PRN
  Administered 2023-05-01: 25 ug via INTRAVENOUS
  Administered 2023-05-01: 50 ug via INTRAVENOUS

## 2023-05-01 MED ORDER — HEPARIN SODIUM (PORCINE) 1000 UNIT/ML IJ SOLN
INTRAMUSCULAR | Status: AC
  Filled 2023-05-01: qty 10

## 2023-05-01 MED ORDER — MIDAZOLAM HCL 2 MG/ML PO SYRP: 8.0000 mg | ORAL_SOLUTION | Freq: Once | ORAL | Status: DC | PRN

## 2023-05-01 MED ORDER — CEFAZOLIN SODIUM-DEXTROSE 2-4 GM/100ML-% IV SOLN
INTRAVENOUS | Status: AC
  Filled 2023-05-01: qty 100

## 2023-05-01 MED ORDER — SODIUM CHLORIDE 0.9 % IV SOLN: INTRAVENOUS | Status: DC

## 2023-05-01 MED ORDER — IODIXANOL 320 MG/ML IV SOLN
INTRAVENOUS | Status: DC | PRN
  Administered 2023-05-01: 35 mL

## 2023-05-01 MED ORDER — VITAMIN C 500 MG PO TABS
500.0000 mg | ORAL_TABLET | Freq: Two times a day (BID) | ORAL | Status: DC
  Administered 2023-05-01 – 2023-05-05 (×8): 500 mg via ORAL
  Filled 2023-05-01 (×8): qty 1

## 2023-05-01 MED ORDER — PANTOPRAZOLE SODIUM 40 MG PO TBEC
40.0000 mg | DELAYED_RELEASE_TABLET | Freq: Every day | ORAL | Status: DC
  Administered 2023-05-01 – 2023-05-04 (×4): 40 mg via ORAL
  Filled 2023-05-01 (×4): qty 1

## 2023-05-01 MED ORDER — MIDAZOLAM HCL 2 MG/2ML IJ SOLN
INTRAMUSCULAR | Status: DC | PRN
  Administered 2023-05-01: 1 mg via INTRAVENOUS
  Administered 2023-05-01: .5 mg via INTRAVENOUS

## 2023-05-01 MED ORDER — VANCOMYCIN HCL 750 MG/150ML IV SOLN
750.0000 mg | INTRAVENOUS | Status: DC
  Administered 2023-05-01 – 2023-05-02 (×2): 750 mg via INTRAVENOUS
  Filled 2023-05-01 (×2): qty 150

## 2023-05-01 MED ORDER — HEPARIN (PORCINE) IN NACL 1000-0.9 UT/500ML-% IV SOLN
INTRAVENOUS | Status: DC | PRN
  Administered 2023-05-01: 1000 mL

## 2023-05-01 MED ORDER — FENTANYL CITRATE (PF) 100 MCG/2ML IJ SOLN
INTRAMUSCULAR | Status: AC
  Filled 2023-05-01: qty 2

## 2023-05-01 SURGICAL SUPPLY — 9 items
CATH ANGIO 5F PIGTAIL 65CM (CATHETERS) IMPLANT
COVER PROBE ULTRASOUND 5X96 (MISCELLANEOUS) IMPLANT
DEVICE STARCLOSE SE CLOSURE (Vascular Products) IMPLANT
GLIDEWIRE ADV .035X260CM (WIRE) IMPLANT
PACK ANGIOGRAPHY (CUSTOM PROCEDURE TRAY) ×1 IMPLANT
SHEATH BRITE TIP 5FRX11 (SHEATH) IMPLANT
SYR MEDRAD MARK 7 150ML (SYRINGE) IMPLANT
TUBING CONTRAST HIGH PRESS 72 (TUBING) IMPLANT
WIRE GUIDERIGHT .035X150 (WIRE) IMPLANT

## 2023-05-01 NOTE — Op Note (Signed)
 [facility]  Percutaneous Study/Intervention Procedural Note   Date of Surgery: 05/01/2023  Surgeon(s):Castle Lamons    Assistants:none  Pre-operative Diagnosis: PAD with gangrenous changes to the right foot  Post-operative diagnosis:  Same  Procedure(s) Performed:             1.  Ultrasound guidance for vascular access left femoral artery             2.  Catheter placement into right SFA from left femoral approach             3.  Aortogram and selective right lower extremity angiogram             4.  StarClose closure device left femoral artery  EBL: 5 cc  Contrast: 35 cc  Fluoro Time: 0.8 minutes  Moderate Conscious Sedation Time: approximately 16 minutes using 1.5 mg of Versed  and 75 mcg of Fentanyl               Indications:  Patient is a 68 y.o.female with gangrenous changes in the right foot. The patient is brought in for angiography for further evaluation and potential treatment.  Due to the limb threatening nature of the situation, angiogram was performed for attempted limb salvage. The patient is aware that if the procedure fails, amputation would be expected.  The patient also understands that even with successful revascularization, amputation may still be required due to the severity of the situation.  Risks and benefits are discussed and informed consent is obtained.   Procedure:  The patient was identified and appropriate procedural time out was performed.  The patient was then placed supine on the table and prepped and draped in the usual sterile fashion. Moderate conscious sedation was administered during a face to face encounter with the patient throughout the procedure with my supervision of the RN administering medicines and monitoring the patient's vital signs, pulse oximetry, telemetry and mental status throughout from the start of the procedure until the patient was taken to the recovery room. Ultrasound was used to evaluate the left common  femoral artery.  It was patent .  A digital ultrasound image was acquired.  A Seldinger needle was used to access the left common femoral artery under direct ultrasound guidance and a permanent image was performed.  A 0.035 J wire was advanced without resistance and a 5Fr sheath was placed.  Pigtail catheter was placed into the aorta and an AP aortogram was performed. This demonstrated normal renal arteries and normal aorta and iliac segments without significant stenosis. I then crossed the aortic bifurcation and advanced to the right femoral head and then into the right SFA to opacify distally. Selective right lower extremity angiogram was then performed. This demonstrated fairly normal common femoral artery, superficial femoral artery, and popliteal arteries.  There was a typical tibial trifurcation.  The anterior tibial and posterior tibial arteries were continuous to the foot.  There was small vessel disease with pruning of the blood vessels in the foot and ankle, but no focal stenosis that would benefit from any intervention. I elected to terminate the procedure. The sheath was removed and StarClose closure device was deployed in the left femoral artery with excellent hemostatic result. The patient was taken to the recovery room in stable condition having tolerated the procedure well.  Findings:               Aortogram:  This demonstrated normal renal arteries and normal aorta and iliac segments without significant stenosis.  Right Lower Extremity:  This demonstrated fairly normal common femoral artery, superficial femoral artery, and popliteal arteries.  There was a typical tibial trifurcation.  The anterior tibial and posterior tibial arteries were continuous to the foot.  There was small vessel disease with pruning of the blood vessels in the foot and ankle, but no focal stenosis that would benefit from any intervention.    Disposition: Patient was taken to the recovery room in stable  condition having tolerated the procedure well.  Complications: None  Laurie Lin 05/01/2023 10:56 AM   This note was created with Dragon Medical transcription system. Any errors in dictation are purely unintentional.

## 2023-05-01 NOTE — Plan of Care (Signed)

## 2023-05-01 NOTE — Progress Notes (Signed)
 Initial Nutrition Assessment  DOCUMENTATION CODES:   Not applicable  INTERVENTION:   Ensure Enlive po BID, each supplement provides 350 kcal and 20 grams of protein.  MVI po daily   Vitamin C  500mg  po BID   Daily weights   Check Vitamin D  level   NUTRITION DIAGNOSIS:   Increased nutrient needs related to wound healing as evidenced by estimated needs.  GOAL:   Patient will meet greater than or equal to 90% of their needs  MONITOR:   PO intake, Supplement acceptance, Labs, Weight trends, Skin, I & O's  REASON FOR ASSESSMENT:   Consult Assessment of nutrition requirement/status  ASSESSMENT:   68 y/o female with h/o IDDM, HTN, etoh abuse, GERD, hital hernia, dementia, neuropathy, PUD, cirrhosis, varices, CKD IV, lengthy admission for encephalopathy, UTI and possible Wernickes requiring IR G-tube placement 04/29/22 (now removed) r/t AMS & dysphagia, Mallory-Weiss tear and esophagitis who is admitted with abdominal pain and R toe gangrene s/p vascular intervention today.  Met with pt and family in room today. Pt is well known to this RD from a lengthy previous admission where pt required G-tube last year. Pt is a poor historian so majority of history provided by family. Husband reports that pt lives in a memory care unit now secondary to dementia. Pt was able to have her G-tube removed ~1 month after discharge from the hospital per family report. Pt has been eating well and able to maintain her weight pta. Family reports that pt was previously drinking Boost but reports that she stopped as it started to upset her stomach. RD discussed with pt the importance of adequate nutrition needed to preserve lean muscle and support wound healing. Pt is agreeable to try drinking Ensure in hospital to help support her toe healing. RD will add supplements and vitamins to help pt meet her estimated needs. Pt with h/o vitamin D  and folate deficiencies. Folate wnl this admission; RD will check vitamin  D level.    Medications reviewed and include: D3, B12, insulin , heparin , MVI, thiamine , ceftriaxone , vancomycin     Labs reviewed: K 4.2 wnl, BUN 43(H), creat 1.64(H) Folate 36.0 wnl, B12 432- 1/9 Vitamin D - 13.6(L)- 04/29/2022 Hgb 8.3(L), Hct 25.4(L) Cbgs- 148, 105 x 24 hrs AIC 9.2(H)- 1/9  NUTRITION - FOCUSED PHYSICAL EXAM:  Flowsheet Row Most Recent Value  Orbital Region No depletion  Upper Arm Region Severe depletion  Thoracic and Lumbar Region No depletion  Buccal Region No depletion  Temple Region Mild depletion  Clavicle Bone Region Mild depletion  Clavicle and Acromion Bone Region Mild depletion  Scapular Bone Region Mild depletion  Dorsal Hand Mild depletion  Patellar Region Mild depletion  Anterior Thigh Region No depletion  Posterior Calf Region No depletion  Edema (RD Assessment) None  Hair Reviewed  Eyes Reviewed  Mouth Reviewed  Skin Reviewed  Nails Reviewed   Diet Order:   Diet Order             Diet Carb Modified Fluid consistency: Thin; Room service appropriate? Yes  Diet effective now                  EDUCATION NEEDS:   Education needs have been addressed  Skin:  Skin Assessment: Reviewed RN Assessment (gangrene R toe)  Last BM:  1/8  Height:   Ht Readings from Last 1 Encounters:  04/29/23 5' 10 (1.778 m)    Weight:   Wt Readings from Last 1 Encounters:  05/01/23 75.9 kg  Ideal Body Weight:  68 kg  BMI:  Body mass index is 24.01 kg/m.  Estimated Nutritional Needs:   Kcal:  2000-2300kcal/day  Protein:  100-115g/day  Fluid:  1.8-2.1L/day  Augustin Shams MS, RD, LDN If unable to be reached, please send secure chat to RD inpatient available from 8:00a-4:00p daily

## 2023-05-01 NOTE — Inpatient Diabetes Management (Signed)
 Inpatient Diabetes Program Recommendations  AACE/ADA: New Consensus Statement on Inpatient Glycemic Control   Target Ranges:  Prepandial:   less than 140 mg/dL      Peak postprandial:   less than 180 mg/dL (1-2 hours)      Critically ill patients:  140 - 180 mg/dL    Latest Reference Range & Units 04/30/23 07:48 04/30/23 12:32 04/30/23 21:15 05/01/23 09:06  Glucose-Capillary 70 - 99 mg/dL 840 (H) 841 (H) 730 (H) 105 (H)    Review of Glycemic Control  Diabetes history: DM1 (does NOT make any insulin ; requires basal, correction, and carb coverage insulin ) Outpatient Diabetes medications: Basaglar  8 units daily, Novolog  0-9 units TID with meals Current orders for Inpatient glycemic control: Semglee  8 units at bedtime, Novolog  0-9 units TID with meals, Novolog  0-5 units QHS  Inpatient Diabetes Program Recommendations:    Insulin : Once diet advanced and if patient is eating at least 50% of meals, will need to order meal coverage insulin .   NOTE: Patient admitted with gangrene of toe on right foot, abdominal pain and vomiting, possible acute anemia, and has hx of DM1. Per chart patient sees [facility] and last seen Dr. Garnett Schooner on 02/03/23. Per office note on 02/03/23 patient is prescribed Basaglar  9 units at bedtime, Novolog  1 unit for every 15 grams of carbs, and Novolog  1 unit for every 40 mg/dl above target glucose of 150 mg/dl.   Thanks, Earnie Gainer, RN, MSN, CDCES Diabetes Coordinator Inpatient Diabetes Program [phone] (Team Pager from 8am to 5pm)

## 2023-05-01 NOTE — Progress Notes (Signed)
 PROGRESS NOTE    Laurie Lin  FMW:[id number] DOB: May 04, 1955 DOA: 04/29/2023 PCP: Vincente Saber, NP  Chief Complaint  Patient presents with   [facility] Course:  Laurie Lin is 68 y.o. female with prior diagnosis of Warnicke Korsakoff encephalopathy, CKD stage IV, type 1 diabetes, recent admission for Mallory-Weiss tear and grade D esophagitis seen on EGD.  She is brought to the ED on this admission with abdominal pain and vomiting as well as concerns of bleeding from the right second toe.  Patient is unable to provide history due to her chronic encephalopathy.  In the ED she was found to have be tachycardic, otherwise normal vital signs.  WBC 11.9, negative respiratory viral panel, CT head and C-spine without acute changes.  Chest x-ray without active disease.  Hip x-ray without fracture.  Right foot x-ray negative for signs of osteomyelitis.  CT abdomen pelvis without acute changes.  She was started on broad-spectrum antibiotics for presumed cellulitis/wound infection and admitted to TRH service.   Subjective: No acute events overnight. On evaluation today patient was seen postoperatively.  We discussed the findings in her aortogram.  Her husband and daughter at bedside.  Patient denies any acute pain.   She was anxious after her family left and required Ativan .   Objective: Vitals:   04/30/23 0638 04/30/23 0840 04/30/23 1259 04/30/23 2003  BP: (!) 149/88 131/83 128/84 (!) 156/61  Pulse: 90 89 87 87  Resp: 16 20 19 18   Temp:  98.1 F (36.7 C) 98 F (36.7 C) 98 F (36.7 C)  TempSrc:  Oral Oral Oral  SpO2: 95% 99% 98% 100%  Weight:      Height:       No intake or output data in the 24 hours ending 05/01/23 0803  Filed Weights   04/29/23 1758  Weight: 74.8 kg    Examination: General exam: Appears calm and comfortable, NAD  Respiratory system: No work of breathing, symmetric chest wall expansion Cardiovascular system: S1 & S2 heard, RRR.  Gastrointestinal  system: Abdomen is nondistended, soft and nontender.  Neuro: Alert and confused, requires reorientation. Skin: Gangrenous changes to the second toe of right foot, thickened skin, flaking at distal endpoints, thick toenails, black discoloration of almost entire right toe.  No significant surrounding erythema.  No active oozing or bleeding. Psychiatry: Demonstrates appropriate judgement and insight. Mood & affect appropriate for situation.   Assessment & Plan:  Principal Problem:   Gangrene of toe of right foot (HCC) Active Problems:   History of hematemesis   History of hematemesis, grade D esophagitis on EGD 04/2022   Abdominal pain and vomiting   Chronic disease anemia   Diabetic peripheral neuropathy (HCC)   Type 1 diabetes mellitus (HCC)   Chronic kidney disease, stage IV (severe) (HCC)   Wernicke-Korsakoff syndrome (HCC)    Green right foot second toe Diabetic peripheral neuropathy - MRI foot obtained: No obvious osteomyelitis.  Possible osteonecrosis early stages. - Elevated ESR, minimally elevated CRP - ABIs ordered but unable to be calculated due to stenosis. - Weightbearing as tolerated - Continue with vancomycin  and Rocephin  for now.  MRSA PCR pending de-escalate when able. - Podiatry consulted - Vascular consulted in ED - Was previously seeing podiatry at [facility] in 2023  Abdominal pain and vomiting - History of Mallory-Weiss tear 1 yr ago - Known grade D esophagitis - History of dysphagia requiring PEG tube CT abdomen pelvis nonacute - LFTs within normal limits. - Lipase  unremarkable - Currently on IV Protonix  and antiemetics - Hemoglobin appears to be downtrending, repeat CBC stable. Continue to monitor  Acute on chronic anemia - Anemia of CKD, unclear baseline.  Hemoglobin downtrending now.  Possible GI etiology given history - If hemoglobin continues to downtrend will consider GI consultation  Type 1 diabetes, uncontrolled - Hemoglobin A1c 9.2% - Follows with  endocrinology outpatient - Continue with basal bolus insulin  for now, titrate up as tolerated  CKD stage IV - Renal function appears to be at her baseline - Avoid nephrotoxic meds when possible.  Renally dose when needed  Warnicke Korsakoff syndrome - Continue frequent reorientation, delirium precautions - Discussed care with husband. -- Resides at [facility] memory care  DVT prophylaxis: Open   Code Status: Full Code Family Communication: Discussed with husband and daughter who is at bedside.  Disposition:  Status is: Inpatient, will obtain PT/OT evals.  Anticipate patient will require HH at discharge. Pending final podiatry plans  Consultants:    Treatment Team:  Consulting Physician: Malvin Marsa FALCON, DPM  Procedures:    Antimicrobials:  Anti-infectives (From admission, onward)    Start     Dose/Rate Route Frequency Ordered Stop   05/01/23 1200  vancomycin  (VANCOCIN ) IVPB 1000 mg/200 mL premix        1,000 mg 200 mL/hr over 60 Minutes Intravenous Every 36 hours 04/30/23 0218     04/30/23 2200  cefTRIAXone  (ROCEPHIN ) 1 g in sodium chloride  0.9 % 100 mL IVPB        1 g 200 mL/hr over 30 Minutes Intravenous Every 24 hours 04/30/23 0031     04/29/23 2345  vancomycin  (VANCOREADY) IVPB 1750 mg/350 mL        1,750 mg 175 mL/hr over 120 Minutes Intravenous  Once 04/29/23 2334 04/30/23 0443   04/29/23 2330  metroNIDAZOLE  (FLAGYL ) IVPB 500 mg        500 mg 100 mL/hr over 60 Minutes Intravenous  Once 04/29/23 2316 04/30/23 0108   04/29/23 2145  cefTRIAXone  (ROCEPHIN ) 1 g in sodium chloride  0.9 % 100 mL IVPB        1 g 200 mL/hr over 30 Minutes Intravenous  Once 04/29/23 2143 04/29/23 2349       Data Reviewed: I have personally reviewed following labs and imaging studies CBC: Recent Labs  Lab 04/29/23 1801 04/30/23 0148 04/30/23 2029  WBC 11.9* 8.4 7.3  HGB 8.9* 7.9* 8.3*  HCT 28.3* 24.8* 25.2*  MCV 95.9 96.1 94.0  PLT 393 360 372   Basic Metabolic  Panel: Recent Labs  Lab 04/29/23 1801 04/30/23 0148  NA 139  --   K 4.8  --   CL 105  --   CO2 23  --   GLUCOSE 155*  --   BUN 69*  --   CREATININE 2.36* 2.18*  CALCIUM  9.1  --    GFR: Estimated Creatinine Clearance: 26.7 mL/min (A) (by C-G formula based on SCr of 2.18 mg/dL (H)). Liver Function Tests: Recent Labs  Lab 04/30/23 0148  AST 16  ALT 13  ALKPHOS 40  BILITOT 0.7  PROT 6.6  ALBUMIN 3.2*   CBG: Recent Labs  Lab 04/30/23 0748 04/30/23 1232 04/30/23 2115  GLUCAP 159* 158* 269*    Recent Results (from the past 240 hours)  Resp panel by RT-PCR (RSV, Flu A&B, Covid) Anterior Nasal Swab     Status: None   Collection Time: 04/29/23  8:47 PM   Specimen: Anterior Nasal Swab  Result Value  Ref Range Status   SARS Coronavirus 2 by RT PCR NEGATIVE NEGATIVE Final    Comment: (NOTE) SARS-CoV-2 target nucleic acids are NOT DETECTED.  The SARS-CoV-2 RNA is generally detectable in upper respiratory specimens during the acute phase of infection. The lowest concentration of SARS-CoV-2 viral copies this assay can detect is 138 copies/mL. A negative result does not preclude SARS-Cov-2 infection and should not be used as the sole basis for treatment or other patient management decisions. A negative result may occur with  improper specimen collection/handling, submission of specimen other than nasopharyngeal swab, presence of viral mutation(s) within the areas targeted by this assay, and inadequate number of viral copies(<138 copies/mL). A negative result must be combined with clinical observations, patient history, and epidemiological information. The expected result is Negative.  Fact Sheet for Patients:  bloggercourse.com  Fact Sheet for Healthcare Providers:  seriousbroker.it  This test is no t yet approved or cleared by the [place]  FDA and  has been authorized for detection and/or diagnosis of SARS-CoV-2  by FDA under an Emergency Use Authorization (EUA). This EUA will remain  in effect (meaning this test can be used) for the duration of the COVID-19 declaration under Section 564(b)(1) of the Act, 21 U.S.C.section 360bbb-3(b)(1), unless the authorization is terminated  or revoked sooner.       Influenza A by PCR NEGATIVE NEGATIVE Final   Influenza B by PCR NEGATIVE NEGATIVE Final    Comment: (NOTE) The Xpert Xpress SARS-CoV-2/FLU/RSV plus assay is intended as an aid in the diagnosis of influenza from Nasopharyngeal swab specimens and should not be used as a sole basis for treatment. Nasal washings and aspirates are unacceptable for Xpert Xpress SARS-CoV-2/FLU/RSV testing.  Fact Sheet for Patients: bloggercourse.com  Fact Sheet for Healthcare Providers: seriousbroker.it  This test is not yet approved or cleared by the [place]  FDA and has been authorized for detection and/or diagnosis of SARS-CoV-2 by FDA under an Emergency Use Authorization (EUA). This EUA will remain in effect (meaning this test can be used) for the duration of the COVID-19 declaration under Section 564(b)(1) of the Act, 21 U.S.C. section 360bbb-3(b)(1), unless the authorization is terminated or revoked.     Resp Syncytial Virus by PCR NEGATIVE NEGATIVE Final    Comment: (NOTE) Fact Sheet for Patients: bloggercourse.com  Fact Sheet for Healthcare Providers: seriousbroker.it  This test is not yet approved or cleared by the [place]  FDA and has been authorized for detection and/or diagnosis of SARS-CoV-2 by FDA under an Emergency Use Authorization (EUA). This EUA will remain in effect (meaning this test can be used) for the duration of the COVID-19 declaration under Section 564(b)(1) of the Act, 21 U.S.C. section 360bbb-3(b)(1), unless the authorization is terminated or revoked.  Performed at  [facility], [address] [place], [place] [address]      Radiology Studies: US  ARTERIAL ABI (SCREENING LOWER EXTREMITY) Result Date: 04/30/2023 CLINICAL DATA:  Peripheral gangrene EXAM: NONINVASIVE PHYSIOLOGIC VASCULAR STUDY OF BILATERAL LOWER EXTREMITIES TECHNIQUE: Evaluation of both lower extremities were performed at rest, including calculation of ankle-brachial indices with single level Doppler, pressure and pulse volume recording. COMPARISON:  None Available. FINDINGS: Right ABI:  Not able to be calculated Left ABI:  Not able to be calculated Right Lower Extremity:  Monophasic waveforms are noted. Left Lower Extremity:  Monophasic waveforms are noted. > 1.4 Non diagnostic secondary to incompressible vessel calcifications (medial arterial sclerosis of [place]cke[place]) IMPRESSION: Ankle brachial indices could not be calculated due to noncompressible  vessels. Monophasic waveforms are noted distally consistent with peripheral vascular disease. Electronically Signed   By: Oneil Devonshire M.D.   On: 04/30/2023 18:55   MR FOOT RIGHT WO CONTRAST Result Date: 04/30/2023 CLINICAL DATA:  Osteonecrosis suspected, foot, xray done Black discoloration of the 2nd toe for 1 week.  History of diabetes. EXAM: MRI OF THE RIGHT FOREFOOT WITHOUT CONTRAST TECHNIQUE: Multiplanar, multisequence MR imaging of the right forefoot was performed. No intravenous contrast was administered. COMPARISON:  Radiographs 04/29/2023 FINDINGS: Technical note: Despite efforts by the technologist and patient, mild to moderate motion artifact is present on today's exam and could not be eliminated. This reduces exam sensitivity and specificity. In addition, fat saturation on the T2 weighted images is suboptimal. Bones/Joint/Cartilage Sagittal inversion recovery images demonstrate mild marrow hyperintensity within the distal 1st and 2nd phalanges. No cortical destruction or T1 marrow abnormalities are seen in these areas. Mild degenerative  changes at the 1st metacarpal phalangeal joint with mild T2 hyperintensity and T1 hypointensity within the tibial sesamoid, likely degenerative. No significant joint effusions. The alignment is normal at the Lisfranc joint. Ligaments Intact Lisfranc ligament. Intact collateral ligaments of the metatarsophalangeal joints. Muscles and Tendons Generalized muscular atrophy and T2 hyperintensity, likely related to underlying diabetes. No focal intramuscular fluid collection, tendon tear or significant tenosynovitis. Soft tissues Mild generalized forefoot soft tissue edema without evidence of focal fluid collection, foreign body or focal skin ulceration. IMPRESSION: 1. Mild marrow hyperintensity within the distal 1st and 2nd phalanges, nonspecific in the absence of focal skin ulceration and possibly reactive or secondary to early osteonecrosis. No cortical destruction or T1 marrow abnormalities to suggest osteomyelitis. Correlate clinically. 2. Mild generalized forefoot soft tissue edema without evidence of focal fluid collection, foreign body or focal skin ulceration. 3. Degenerative changes at the 1st metatarsophalangeal joint and it is tibial sesamoid. 4. Generalized muscular atrophy and T2 hyperintensity, likely related to underlying diabetes. Electronically Signed   By: Elsie Perone M.D.   On: 04/30/2023 12:23   CT ABDOMEN PELVIS WO CONTRAST Result Date: 04/29/2023 CLINICAL DATA:  Fall emesis EXAM: CT ABDOMEN AND PELVIS WITHOUT CONTRAST TECHNIQUE: Multidetector CT imaging of the abdomen and pelvis was performed following the standard protocol without IV contrast. RADIATION DOSE REDUCTION: This exam was performed according to the departmental dose-optimization program which includes automated exposure control, adjustment of the mA and/or kV according to patient size and/or use of iterative reconstruction technique. COMPARISON:  CT 05/06/2022 FINDINGS: Lower chest: No acute airspace disease or pleural effusion.  Calcified granuloma at the right lung base. Mild cardiomegaly Hepatobiliary: No focal liver abnormality is seen. No gallstones, gallbladder wall thickening, or biliary dilatation. Pancreas: Unremarkable. No pancreatic ductal dilatation or surrounding inflammatory changes. Spleen: Normal in size without focal abnormality. Adrenals/Urinary Tract: Adrenal glands are normal. Kidneys show no hydronephrosis. Extensive intrarenal vascular calcifications. The bladder is unremarkable Stomach/Bowel: Stomach is within normal limits. No evidence of bowel wall thickening, distention, or inflammatory changes. Vascular/Lymphatic: Moderate aortic atherosclerosis. No aneurysm. No suspicious lymph nodes. Reproductive: Uterus and bilateral adnexa are unremarkable. Other: Negative for pelvic effusion or free air. Musculoskeletal: No acute or osseous abnormality. IMPRESSION: 1. No CT evidence for acute intra-abdominal or pelvic abnormality. 2. Aortic atherosclerosis. Aortic Atherosclerosis (ICD10-I70.0). Electronically Signed   By: Luke Bun M.D.   On: 04/29/2023 23:13   DG Foot Complete Right Result Date: 04/29/2023 CLINICAL DATA:  Gangrenous changes of second toe. Evaluation for osteomyelitis fracture. EXAM: RIGHT FOOT COMPLETE - 3+ VIEW COMPARISON:  None Available.  FINDINGS: No acute fracture or dislocation. No evidence of osteomyelitis. Soft tissue swelling about the second toe. Vascular calcifications. IMPRESSION: No acute fracture or evidence of osteomyelitis. Electronically Signed   By: Norman Gatlin M.D.   On: 04/29/2023 22:57   DG Pelvis 1-2 Views Result Date: 04/29/2023 CLINICAL DATA:  Frequent falls EXAM: PELVIS - 1-2 VIEW COMPARISON:  CT abdomen and pelvis 05/06/2022 FINDINGS: No acute fracture or dislocation. Degenerative changes pubic symphysis, both hips, SI joints and lower lumbar spine. IMPRESSION: No acute fracture or dislocation. Electronically Signed   By: Norman Gatlin M.D.   On: 04/29/2023 20:59   DG  Chest Portable 1 View Result Date: 04/29/2023 CLINICAL DATA:  Frequent falls. EXAM: PORTABLE CHEST 1 VIEW COMPARISON:  04/25/2022 FINDINGS: Stable cardiomediastinal silhouette. No focal consolidation, pleural effusion, or pneumothorax. No displaced rib fractures. IMPRESSION: No active disease. Electronically Signed   By: Norman Gatlin M.D.   On: 04/29/2023 20:58   CT Cervical Spine Wo Contrast Result Date: 04/29/2023 CLINICAL DATA:  Neck trauma.  Multiple falls. EXAM: CT CERVICAL SPINE WITHOUT CONTRAST TECHNIQUE: Multidetector CT imaging of the cervical spine was performed without intravenous contrast. Multiplanar CT image reconstructions were also generated. RADIATION DOSE REDUCTION: This exam was performed according to the departmental dose-optimization program which includes automated exposure control, adjustment of the mA and/or kV according to patient size and/or use of iterative reconstruction technique. COMPARISON:  CT of the cervical spine 06/09/2022 FINDINGS: Alignment: Slight degenerative anterolisthesis at C4-5 is stable. No other significant listhesis is present. Chronic straightening of the normal cervical lordosis is unchanged. Skull base and vertebrae: The craniocervical junction is within normal limits. The vertebral body heights are normal. No acute fractures are present. Soft tissues and spinal canal: No prevertebral fluid or swelling. No visible canal hematoma. Disc levels: Uncovertebral and facet hypertrophy lead to mild foraminal narrowing bilaterally at C5-6. No other significant stenosis is present. Upper chest: The lung apices are clear. The thoracic inlet is within normal limits. IMPRESSION: 1. No acute fracture or traumatic subluxation. 2. Mild foraminal narrowing bilaterally at C5-6. Electronically Signed   By: Lonni Necessary M.D.   On: 04/29/2023 19:01   CT HEAD WO CONTRAST ( ) Result Date: 04/29/2023 CLINICAL DATA:  Head trauma.  Fall. EXAM: CT HEAD WITHOUT CONTRAST  TECHNIQUE: Contiguous axial images were obtained from the base of the skull through the vertex without intravenous contrast. RADIATION DOSE REDUCTION: This exam was performed according to the departmental dose-optimization program which includes automated exposure control, adjustment of the mA and/or kV according to patient size and/or use of iterative reconstruction technique. COMPARISON:  CT head without contrast 09/10/2022. FINDINGS: Brain: Moderate generalized atrophy and white matter disease is present bilaterally. No acute infarct, hemorrhage, or mass lesion is present. Ventricular enlargement is stable. Chronic disc proportionate white matter volume loss in the parietal lobes is evident bilaterally. The brainstem and cerebellum are within normal limits. Midline structures are within normal limits. Vascular: Atherosclerotic calcifications are present within the cavernous internal carotid arteries bilaterally. No hyperdense vessel is present. Skull: Calvarium is intact. No focal lytic or blastic lesions are present. No significant extracranial soft tissue lesion is present. Sinuses/Orbits: The paranasal sinuses and mastoid air cells are clear. Bilateral lens replacements are noted. Globes and orbits are otherwise unremarkable. IMPRESSION: 1. No acute intracranial abnormality or significant interval change. 2. Moderate generalized atrophy and white matter disease likely reflects the sequela of chronic microvascular ischemia. 3. Chronic disproportionate white matter volume loss in the parietal  lobes bilaterally. Electronically Signed   By: Lonni Necessary M.D.   On: 04/29/2023 18:59    Scheduled Meds:  vitamin D3  2,000 Units Oral Daily   cyanocobalamin   1,000 mcg Oral Daily   folic acid   1 mg Oral Daily   heparin   5,000 Units Subcutaneous Q8H   insulin  aspart  0-5 Units Subcutaneous QHS   insulin  aspart  0-9 Units Subcutaneous TID WC   insulin  glargine-yfgn  8 Units Subcutaneous QHS    multivitamin with minerals  1 tablet Oral Daily   pantoprazole  (PROTONIX ) IV  40 mg Intravenous Q24H   risperiDONE   1 mg Oral QHS   sertraline   50 mg Oral QHS   thiamine   100 mg Oral Daily   Continuous Infusions:  cefTRIAXone  (ROCEPHIN )  IV 1 g (04/30/23 2139)   vancomycin        LOS: 2 days    Time spent:   Lorane Poland, DO [facility]  To contact the attending physician between 7A-7P please use Epic Chat. To contact the covering physician during after hours 7P-7A, please review Amion.   05/01/2023, 8:03 AM   *This document has been created with the assistance of dictation software. Please excuse typographical errors. *

## 2023-05-01 NOTE — Progress Notes (Addendum)
 Pharmacy Antibiotic Note  Laurie Lin is a 68 y.o. female admitted on 04/29/2023 with cellulitis.  Pharmacy has been consulted for Vancomycin  dosing. Green right foot second toe. Pt has hx diabetes. Afeb, WBC 11 > 8.6, no abscess noted on imaging. No osteo on imaging.   Plan: Pt received vancomycin  1750 mg x 1 loading dose. Will adjust vancomycin  1000 mg q36 to vancomycin  750 mg q24H. Scr has improved, but baseline seems to be around 2.4. Predicted AUC 411. Goal AUC 400-600. Plan to obtain vancomycin  level after 4th or 5th dose.   Continue ceftriaxone  2 g daily.    Temp (24hrs), Avg:98 F (36.7 C), Min:98 F (36.7 C), Max:98 F (36.7 C)   Recent Labs  Lab 04/29/23 1801 04/30/23 0148 04/30/23 2029 05/01/23 0838  WBC 11.9* 8.4 7.3 8.6  CREATININE 2.36* 2.18*  --  1.64*    Estimated Creatinine Clearance: 35.5 mL/min (A) (by C-G formula based on SCr of 1.64 mg/dL (H)).    Allergies  Allergen Reactions   Ibuprofen Nausea Only   Nsaids     Ulcerative stomach and small intestines   Brimonidine Itching    Red, itchy, sensitivity to light Red, itchy, sensitivity to light    Latex Itching    Other reaction(s): Other (see comments)    Antimicrobials this admission: 01/08 Ceftriaxone  >>  01/08 Vancomycin  >>  01/08 Flagyl  >> x 1 dose  Microbiology results: 01/08 UCx: Pending   Thank you for allowing pharmacy to be a part of this patient's care.  Cathaleen Blanch, PharmD,  05/01/2023 9:34 AM

## 2023-05-02 DIAGNOSIS — I96 Gangrene, not elsewhere classified: Secondary | ICD-10-CM | POA: Diagnosis not present

## 2023-05-02 LAB — URINE CULTURE: Culture: 80000 — AB

## 2023-05-02 LAB — GLUCOSE, CAPILLARY
Glucose-Capillary: 137 mg/dL — ABNORMAL HIGH (ref 70–99)
Glucose-Capillary: 208 mg/dL — ABNORMAL HIGH (ref 70–99)
Glucose-Capillary: 208 mg/dL — ABNORMAL HIGH (ref 70–99)
Glucose-Capillary: 125 mg/dL — ABNORMAL HIGH (ref 70–99)

## 2023-05-02 LAB — COMPREHENSIVE METABOLIC PANEL
ALT: 10 U/L (ref 0–44)
AST: 14 U/L — ABNORMAL LOW (ref 15–41)
Albumin: 2.9 g/dL — ABNORMAL LOW (ref 3.5–5.0)
Alkaline Phosphatase: 43 U/L (ref 38–126)
Anion gap: 7 (ref 5–15)
BUN: 40 mg/dL — ABNORMAL HIGH (ref 8–23)
CO2: 21 mmol/L — ABNORMAL LOW (ref 22–32)
Calcium: 8.4 mg/dL — ABNORMAL LOW (ref 8.9–10.3)
Chloride: 109 mmol/L (ref 98–111)
Creatinine, Ser: 1.67 mg/dL — ABNORMAL HIGH (ref 0.44–1.00)
GFR, Estimated: 33 mL/min — ABNORMAL LOW (ref >60)
Glucose, Bld: 172 mg/dL — ABNORMAL HIGH (ref 70–99)
Potassium: 4.2 mmol/L (ref 3.5–5.1)
Sodium: 137 mmol/L (ref 135–145)
Total Bilirubin: 0.4 mg/dL (ref 0.0–1.2)
Total Protein: 6.3 g/dL — ABNORMAL LOW (ref 6.5–8.1)

## 2023-05-02 LAB — CBC WITH DIFFERENTIAL/PLATELET
Abs Immature Granulocytes: 0.04 10*3/uL (ref 0.00–0.07)
Basophils Absolute: 0 10*3/uL (ref 0.0–0.1)
Basophils Relative: 0 %
Eosinophils Absolute: 0.6 10*3/uL — ABNORMAL HIGH (ref 0.0–0.5)
Eosinophils Relative: 7 %
HCT: 23.8 % — ABNORMAL LOW (ref 36.0–46.0)
Hemoglobin: 7.5 g/dL — ABNORMAL LOW (ref 12.0–15.0)
Immature Granulocytes: 1 %
Lymphocytes Relative: 24 %
Lymphs Abs: 2 10*3/uL (ref 0.7–4.0)
MCH: 30.5 pg (ref 26.0–34.0)
MCHC: 31.5 g/dL (ref 30.0–36.0)
MCV: 96.7 fL (ref 80.0–100.0)
Monocytes Absolute: 1.3 10*3/uL — ABNORMAL HIGH (ref 0.1–1.0)
Monocytes Relative: 16 %
Neutro Abs: 4.3 10*3/uL (ref 1.7–7.7)
Neutrophils Relative %: 52 %
Platelets: 366 10*3/uL (ref 150–400)
RBC: 2.46 MIL/uL — ABNORMAL LOW (ref 3.87–5.11)
RDW: 13.2 % (ref 11.5–15.5)
WBC: 8.4 10*3/uL (ref 4.0–10.5)
nRBC: 0 % (ref 0.0–0.2)

## 2023-05-02 LAB — PHOSPHORUS: Phosphorus: 3 mg/dL (ref 2.5–4.6)

## 2023-05-02 LAB — MAGNESIUM: Magnesium: 1.6 mg/dL — ABNORMAL LOW (ref 1.7–2.4)

## 2023-05-02 LAB — VITAMIN D 25 HYDROXY (VIT D DEFICIENCY, FRACTURES): Vit D, 25-Hydroxy: 46.23 ng/mL (ref 30–100)

## 2023-05-02 MED ORDER — HYDROXYZINE HCL 10 MG PO TABS
10.0000 mg | ORAL_TABLET | Freq: Three times a day (TID) | ORAL | Status: DC | PRN
  Administered 2023-05-02 – 2023-05-05 (×6): 10 mg via ORAL
  Filled 2023-05-02 (×7): qty 1

## 2023-05-02 NOTE — Plan of Care (Signed)

## 2023-05-02 NOTE — Progress Notes (Signed)
 PROGRESS NOTE    Laurie Lin  FMW:[id number] DOB: Jun 19, 1955 DOA: 04/29/2023 PCP: Vincente Saber, NP  Chief Complaint  Patient presents with   [facility] Course:  Laurie Lin is 68 y.o. female with prior diagnosis of Warnicke Korsakoff encephalopathy, CKD stage IV, type 1 diabetes, recent admission for Mallory-Weiss tear and grade D esophagitis seen on EGD.  She is brought to the ED on this admission with abdominal pain and vomiting as well as concerns of bleeding from the right second toe.  Patient is unable to provide history due to her chronic encephalopathy.  In the ED she was found to have be tachycardic, otherwise normal vital signs.  WBC 11.9, negative respiratory viral panel, CT head and C-spine without acute changes.  Chest x-ray without active disease.  Hip x-ray without fracture.  Right foot x-ray negative for signs of osteomyelitis.  CT abdomen pelvis without acute changes.  She was started on broad-spectrum antibiotics for presumed cellulitis/wound infection and admitted to TRH service.   Subjective: No acute events overnight. On evaluation today is tearful.  She reports that she is lonely and misses her family.   Objective: Vitals:   05/01/23 1556 05/01/23 2101 05/01/23 2347 05/02/23 0905  BP: (!) 112/55 127/63 124/61 131/65  Pulse: 72 71 65 67  Resp: 12 18 20 16   Temp: 98 F (36.7 C) 97.6 F (36.4 C) 98 F (36.7 C) (!) 97.5 F (36.4 C)  TempSrc: Oral     SpO2: 97% 100% 99% 100%  Weight: 75.9 kg     Height:        Intake/Output Summary (Last 24 hours) at 05/02/2023 0908 Last data filed at 05/02/2023 0546 Gross per 24 hour  Intake 590.31 ml  Output --  Net 590.31 ml    Filed Weights   04/29/23 1758 05/01/23 1556  Weight: 74.8 kg 75.9 kg    Examination: General exam: Appears calm and comfortable, NAD  Respiratory system: No work of breathing, symmetric chest wall expansion Cardiovascular system: S1 & S2 heard, RRR.  Gastrointestinal  system: Abdomen is nondistended, soft and nontender.  Neuro: Alert and confused, requires reorientation. Skin: Gangrenous changes to the second toe of right foot, thickened skin, flaking at distal endpoints, thick toenails, black discoloration of distal aspect of toe. no significant surrounding erythema.  No active oozing or bleeding. Psychiatry: Demonstrates appropriate judgement and insight. Mood & affect appropriate for situation.   Assessment & Plan:  Principal Problem:   Gangrene of toe of right foot (HCC) Active Problems:   History of hematemesis   History of hematemesis, grade D esophagitis on EGD 04/2022   Abdominal pain and vomiting   Chronic disease anemia   Diabetic peripheral neuropathy (HCC)   Type 1 diabetes mellitus (HCC)   Chronic kidney disease, stage IV (severe) (HCC)   Wernicke-Korsakoff syndrome (HCC)    Green right foot second toe Diabetic peripheral neuropathy - MRI foot obtained: No obvious osteomyelitis.  Possible osteonecrosis early stages. - Elevated ESR, minimally elevated CRP - ABIs ordered but unable to be calculated due to stenosis. - Weightbearing as tolerated - Continue with vancomycin  and Rocephin  for now.  MRSA PCR pending de-escalate when able. - Podiatry consulted, appreciate further recommendations --Escalate consulted.  Status post angiogram on 1/10.  No acute revascularization needed - Was previously seeing podiatry at [facility] in 2023  UTI Culture growing Citrobacter with resistance to Bactrim  and ciprofloxacin - Cont with ceftriaxone  for now  Abdominal pain and vomiting -  History of Mallory-Weiss tear 1 yr ago - Known grade D esophagitis - History of dysphagia requiring PEG tube CT abdomen pelvis nonacute - LFTs within normal limits. - Lipase unremarkable - Currently on IV Protonix  and antiemetics - Hemoglobin appears to be downtrending, repeat CBC stable. Continue to monitor  Acute on chronic anemia - Anemia of CKD, unclear baseline.   Hemoglobin downtrending now.  Possible GI etiology given history - If hemoglobin continues to downtrend will consider GI consultation  Type 1 diabetes, uncontrolled - Hemoglobin A1c 9.2% - Follows with endocrinology outpatient - Continue with basal bolus insulin  for now, titrate up as tolerated  CKD stage IV - Renal function appears to be at her baseline - Avoid nephrotoxic meds when possible.  Renally dose when needed  Warnicke Korsakoff syndrome - Continue frequent reorientation, delirium precautions - Discussed care with husband. -- Resides at [facility] memory care  DVT prophylaxis: Open   Code Status: Full Code Family Communication: Discussed with husband and daughter who is at bedside.  Disposition:  Status is: Inpatient, will obtain PT/OT evals.  Anticipate patient will require HH at discharge. Pending final podiatry plans  Consultants:    Treatment Team:  Consulting Physician: Malvin Marsa FALCON, DPM  Procedures:  Aortogram/Angiogram 1/10  Antimicrobials:  Anti-infectives (From admission, onward)    Start     Dose/Rate Route Frequency Ordered Stop   05/01/23 2200  vancomycin  (VANCOREADY) IVPB 750 mg/150 mL        750 mg 150 mL/hr over 60 Minutes Intravenous Every 24 hours 05/01/23 0934     05/01/23 1200  vancomycin  (VANCOCIN ) IVPB 1000 mg/200 mL premix  Status:  Discontinued        1,000 mg 200 mL/hr over 60 Minutes Intravenous Every 36 hours 04/30/23 0218 05/01/23 0934   05/01/23 0849  ceFAZolin  (ANCEF ) IVPB 2g/100 mL premix  Status:  Discontinued        2 g 200 mL/hr over 30 Minutes Intravenous 30 min pre-op 05/01/23 0849 05/01/23 1103   04/30/23 2200  cefTRIAXone  (ROCEPHIN ) 1 g in sodium chloride  0.9 % 100 mL IVPB        1 g 200 mL/hr over 30 Minutes Intravenous Every 24 hours 04/30/23 0031     04/29/23 2345  vancomycin  (VANCOREADY) IVPB 1750 mg/350 mL        1,750 mg 175 mL/hr over 120 Minutes Intravenous  Once 04/29/23 2334 04/30/23 0443   04/29/23  2330  metroNIDAZOLE  (FLAGYL ) IVPB 500 mg        500 mg 100 mL/hr over 60 Minutes Intravenous  Once 04/29/23 2316 04/30/23 0108   04/29/23 2145  cefTRIAXone  (ROCEPHIN ) 1 g in sodium chloride  0.9 % 100 mL IVPB        1 g 200 mL/hr over 30 Minutes Intravenous  Once 04/29/23 2143 04/29/23 2349       Data Reviewed: I have personally reviewed following labs and imaging studies CBC: Recent Labs  Lab 04/29/23 1801 04/30/23 0148 04/30/23 2029 05/01/23 0838 05/02/23 0113  WBC 11.9* 8.4 7.3 8.6 8.4  NEUTROABS  --   --   --  4.5 4.3  HGB 8.9* 7.9* 8.3* 8.3* 7.5*  HCT 28.3* 24.8* 25.2* 25.4* 23.8*  MCV 95.9 96.1 94.0 92.4 96.7  PLT 393 360 372 380 366   Basic Metabolic Panel: Recent Labs  Lab 04/29/23 1801 04/30/23 0148 05/01/23 0838 05/02/23 0113  NA 139  --  139 137  K 4.8  --  4.2 4.2  CL 105  --  109 109  CO2 23  --  20* 21*  GLUCOSE 155*  --  105* 172*  BUN 69*  --  43* 40*  CREATININE 2.36* 2.18* 1.64* 1.67*  CALCIUM  9.1  --  8.7* 8.4*  MG  --   --   --  1.6*  PHOS  --   --   --  3.0   GFR: Estimated Creatinine Clearance: 34.9 mL/min (A) (by C-G formula based on SCr of 1.67 mg/dL (H)). Liver Function Tests: Recent Labs  Lab 04/30/23 0148 05/01/23 0838 05/02/23 0113  AST 16 18 14*  ALT 13 11 10   ALKPHOS 40 41 43  BILITOT 0.7 0.3 0.4  PROT 6.6 6.4* 6.3*  ALBUMIN 3.2* 3.1* 2.9*   CBG: Recent Labs  Lab 05/01/23 0906 05/01/23 1150 05/01/23 1628 05/01/23 2129 05/02/23 0902  GLUCAP 105* 148* 233* 182* 137*    Recent Results (from the past 240 hours)  Urine Culture     Status: Abnormal (Preliminary result)   Collection Time: 04/29/23  8:06 PM   Specimen: Urine, Random  Result Value Ref Range Status   Specimen Description   Final    URINE, RANDOM Performed at [facility], [address], [place], [place] [address]    Special Requests   Final    NONE Performed at [facility], [address] [place], [place] [address]    Culture  (A)  Final    80,000 COLONIES/mL CITROBACTER SPECIES SUSCEPTIBILITIES TO FOLLOW Performed at [facility], [address] [place], [place] [address]    Report Status PENDING  Incomplete  Resp panel by RT-PCR (RSV, Flu A&B, Covid) Anterior Nasal Swab     Status: None   Collection Time: 04/29/23  8:47 PM   Specimen: Anterior Nasal Swab  Result Value Ref Range Status   SARS Coronavirus 2 by RT PCR NEGATIVE NEGATIVE Final    Comment: (NOTE) SARS-CoV-2 target nucleic acids are NOT DETECTED.  The SARS-CoV-2 RNA is generally detectable in upper respiratory specimens during the acute phase of infection. The lowest concentration of SARS-CoV-2 viral copies this assay can detect is 138 copies/mL. A negative result does not preclude SARS-Cov-2 infection and should not be used as the sole basis for treatment or other patient management decisions. A negative result may occur with  improper specimen collection/handling, submission of specimen other than nasopharyngeal swab, presence of viral mutation(s) within the areas targeted by this assay, and inadequate number of viral copies(<138 copies/mL). A negative result must be combined with clinical observations, patient history, and epidemiological information. The expected result is Negative.  Fact Sheet for Patients:  bloggercourse.com  Fact Sheet for Healthcare Providers:  seriousbroker.it  This test is no t yet approved or cleared by the [place]  FDA and  has been authorized for detection and/or diagnosis of SARS-CoV-2 by FDA under an Emergency Use Authorization (EUA). This EUA will remain  in effect (meaning this test can be used) for the duration of the COVID-19 declaration under Section 564(b)(1) of the Act, 21 U.S.C.section 360bbb-3(b)(1), unless the authorization is terminated  or revoked sooner.       Influenza A by PCR NEGATIVE NEGATIVE Final   Influenza B by PCR  NEGATIVE NEGATIVE Final    Comment: (NOTE) The Xpert Xpress SARS-CoV-2/FLU/RSV plus assay is intended as an aid in the diagnosis of influenza from Nasopharyngeal swab specimens and should not be used as a sole basis for treatment. Nasal washings and aspirates are unacceptable for  Xpert Xpress SARS-CoV-2/FLU/RSV testing.  Fact Sheet for Patients: bloggercourse.com  Fact Sheet for Healthcare Providers: seriousbroker.it  This test is not yet approved or cleared by the [place]  FDA and has been authorized for detection and/or diagnosis of SARS-CoV-2 by FDA under an Emergency Use Authorization (EUA). This EUA will remain in effect (meaning this test can be used) for the duration of the COVID-19 declaration under Section 564(b)(1) of the Act, 21 U.S.C. section 360bbb-3(b)(1), unless the authorization is terminated or revoked.     Resp Syncytial Virus by PCR NEGATIVE NEGATIVE Final    Comment: (NOTE) Fact Sheet for Patients: bloggercourse.com  Fact Sheet for Healthcare Providers: seriousbroker.it  This test is not yet approved or cleared by the [place]  FDA and has been authorized for detection and/or diagnosis of SARS-CoV-2 by FDA under an Emergency Use Authorization (EUA). This EUA will remain in effect (meaning this test can be used) for the duration of the COVID-19 declaration under Section 564(b)(1) of the Act, 21 U.S.C. section 360bbb-3(b)(1), unless the authorization is terminated or revoked.  Performed at [facility], [address], [place], [place] [address]      Radiology Studies: PERIPHERAL VASCULAR CATHETERIZATION Result Date: 05/01/2023 See surgical note for result.  US  ARTERIAL ABI (SCREENING LOWER EXTREMITY) Result Date: 04/30/2023 CLINICAL DATA:  Peripheral gangrene EXAM: NONINVASIVE PHYSIOLOGIC VASCULAR STUDY OF BILATERAL LOWER EXTREMITIES  TECHNIQUE: Evaluation of both lower extremities were performed at rest, including calculation of ankle-brachial indices with single level Doppler, pressure and pulse volume recording. COMPARISON:  None Available. FINDINGS: Right ABI:  Not able to be calculated Left ABI:  Not able to be calculated Right Lower Extremity:  Monophasic waveforms are noted. Left Lower Extremity:  Monophasic waveforms are noted. > 1.4 Non diagnostic secondary to incompressible vessel calcifications (medial arterial sclerosis of Monckeberg) IMPRESSION: Ankle brachial indices could not be calculated due to noncompressible vessels. Monophasic waveforms are noted distally consistent with peripheral vascular disease. Electronically Signed   By: Oneil Devonshire M.D.   On: 04/30/2023 18:55   MR FOOT RIGHT WO CONTRAST Result Date: 04/30/2023 CLINICAL DATA:  Osteonecrosis suspected, foot, xray done Black discoloration of the 2nd toe for 1 week.  History of diabetes. EXAM: MRI OF THE RIGHT FOREFOOT WITHOUT CONTRAST TECHNIQUE: Multiplanar, multisequence MR imaging of the right forefoot was performed. No intravenous contrast was administered. COMPARISON:  Radiographs 04/29/2023 FINDINGS: Technical note: Despite efforts by the technologist and patient, mild to moderate motion artifact is present on today's exam and could not be eliminated. This reduces exam sensitivity and specificity. In addition, fat saturation on the T2 weighted images is suboptimal. Bones/Joint/Cartilage Sagittal inversion recovery images demonstrate mild marrow hyperintensity within the distal 1st and 2nd phalanges. No cortical destruction or T1 marrow abnormalities are seen in these areas. Mild degenerative changes at the 1st metacarpal phalangeal joint with mild T2 hyperintensity and T1 hypointensity within the tibial sesamoid, likely degenerative. No significant joint effusions. The alignment is normal at the Lisfranc joint. Ligaments Intact Lisfranc ligament. Intact collateral  ligaments of the metatarsophalangeal joints. Muscles and Tendons Generalized muscular atrophy and T2 hyperintensity, likely related to underlying diabetes. No focal intramuscular fluid collection, tendon tear or significant tenosynovitis. Soft tissues Mild generalized forefoot soft tissue edema without evidence of focal fluid collection, foreign body or focal skin ulceration. IMPRESSION: 1. Mild marrow hyperintensity within the distal 1st and 2nd phalanges, nonspecific in the absence of focal skin ulceration and possibly reactive or secondary to early osteonecrosis. No cortical destruction or  T1 marrow abnormalities to suggest osteomyelitis. Correlate clinically. 2. Mild generalized forefoot soft tissue edema without evidence of focal fluid collection, foreign body or focal skin ulceration. 3. Degenerative changes at the 1st metatarsophalangeal joint and it is tibial sesamoid. 4. Generalized muscular atrophy and T2 hyperintensity, likely related to underlying diabetes. Electronically Signed   By: Elsie Perone M.D.   On: 04/30/2023 12:23    Scheduled Meds:  vitamin C   500 mg Oral BID   vitamin D3  2,000 Units Oral Daily   cyanocobalamin   1,000 mcg Oral Daily   feeding supplement  237 mL Oral BID BM   heparin   5,000 Units Subcutaneous Q8H   insulin  aspart  0-5 Units Subcutaneous QHS   insulin  aspart  0-9 Units Subcutaneous TID WC   insulin  glargine-yfgn  8 Units Subcutaneous QHS   multivitamin with minerals  1 tablet Oral Daily   pantoprazole   40 mg Oral Daily   risperiDONE   1 mg Oral QHS   sertraline   50 mg Oral QHS   thiamine   100 mg Oral Daily   Continuous Infusions:  cefTRIAXone  (ROCEPHIN )  IV Stopped (05/02/23 0007)   vancomycin  150 mL/hr at 05/02/23 0546     LOS: 3 days    Time spent:  55min  Elmer Merwin, DO [facility]  To contact the attending physician between 7A-7P please use Epic Chat. To contact the covering physician during after hours 7P-7A, please review  Amion.   05/02/2023, 9:08 AM   *This document has been created with the assistance of dictation software. Please excuse typographical errors. *

## 2023-05-02 NOTE — Evaluation (Signed)
 Physical Therapy Evaluation Patient Details Name: JRUE YAMBAO MRN: [id number] DOB: 1955-12-20 Today's Date: 05/02/2023  History of Present Illness  Pt admitted for R toe gangrene and is s/p angiogram on 04/30/22. Per Podiatry- is WBAT. History includes dementia, encephalopathy, CKD stage 4, and DM.  Clinical Impression  Pt is a pleasant 68 year old female who was admitted for R toe gangrene and is s/p angiogram. Pt has been ambulatory in hallway with RN staff earlier today and is able to performed bed mobility/transfers/ambulation with CGA and RW. Per chart, pt typically ambulatory indep at baseline. Recommend use of RW at this time. Cognition baseline. Pt does not require any further PT needs at this time. Pt will be dc in house and does not require follow up. RN aware. Will dc current orders. Will pass to mobility team to continue mobilization and prevent deconditioning.       If plan is discharge home, recommend the following: A little help with walking and/or transfers;Supervision due to cognitive status;A little help with bathing/dressing/bathroom   Can travel by private vehicle   Yes    Equipment Recommendations None recommended by PT  Recommendations for Other Services       Functional Status Assessment Patient has not had a recent decline in their functional status     Precautions / Restrictions Precautions Precautions: Fall Restrictions Weight Bearing Restrictions Per Provider Order: No      Mobility  Bed Mobility Overal bed mobility: Needs Assistance Bed Mobility: Supine to Sit     Supine to sit: Contact guard     General bed mobility comments: follows commands well. Once seated, upright posture noted    Transfers Overall transfer level: Needs assistance Equipment used: Rolling walker (2 wheels) Transfers: Sit to/from Stand Sit to Stand: Contact guard assist           General transfer comment: safe technique, needs cues for sequencing. Once  standing, upright posture noted    Ambulation/Gait Ambulation/Gait assistance: Contact guard assist Gait Distance (Feet): 200 Feet Assistive device: Rolling walker (2 wheels) Gait Pattern/deviations: Step-through pattern       General Gait Details: ambulated in hallway. Needs cues for direction changes. Reciprocal gait pattern  Stairs            Wheelchair Mobility     Tilt Bed    Modified Rankin (Stroke Patients Only)       Balance Overall balance assessment: Needs assistance Sitting-balance support: No upper extremity supported, Feet supported Sitting balance-Leahy Scale: Good     Standing balance support: Bilateral upper extremity supported Standing balance-Leahy Scale: Fair                               Pertinent Vitals/Pain Pain Assessment Pain Assessment: No/denies pain    Home Living Family/patient expects to be discharged to:: Skilled nursing facility                   Additional Comments: pt from [facility] memory care    Prior Function Prior Level of Function : Independent/Modified Independent             Mobility Comments: per chart review, pt ambulatory without AD. ADLs Comments: needs assist from staff     Extremity/Trunk Assessment   Upper Extremity Assessment Upper Extremity Assessment: Overall WFL for tasks assessed    Lower Extremity Assessment Lower Extremity Assessment: Generalized weakness (B LE grossly 4/5)  Communication   Communication Communication: No apparent difficulties  Cognition Arousal: Alert Behavior During Therapy: WFL for tasks assessed/performed Overall Cognitive Status: History of cognitive impairments - at baseline                                 General Comments: pleasant and agreeable to mobility        General Comments      Exercises Other Exercises Other Exercises: ambulated to [facility] in room. Needs min assist for transition on/off toilet in addition to  doffing underwear. Able to perform self hygiene safely   Assessment/Plan    PT Assessment Patient does not need any further PT services  PT Problem List         PT Treatment Interventions      PT Goals (Current goals can be found in the Care Plan section)  Acute Rehab PT Goals Patient Stated Goal: none stated PT Goal Formulation: All assessment and education complete, DC therapy Time For Goal Achievement: 05/02/23 Potential to Achieve Goals: Good    Frequency       Co-evaluation               AM-PAC PT 6 Clicks Mobility  Outcome Measure Help needed turning from your back to your side while in a flat bed without using bedrails?: A Little Help needed moving from lying on your back to sitting on the side of a flat bed without using bedrails?: A Little Help needed moving to and from a bed to a chair (including a wheelchair)?: A Little Help needed standing up from a chair using your arms (e.g., wheelchair or bedside chair)?: A Little Help needed to walk in hospital room?: A Little Help needed climbing 3-5 steps with a railing? : A Little 6 Click Score: 18    End of Session Equipment Utilized During Treatment: Gait belt Activity Tolerance: Patient tolerated treatment well Patient left: in bed;with bed alarm set Nurse Communication: Mobility status PT Visit Diagnosis: Muscle weakness (generalized) (M62.81);Difficulty in walking, not elsewhere classified (R26.2)    Time: 8671-8651 PT Time Calculation (min) (ACUTE ONLY): 20 min   Charges:   PT Evaluation $PT Eval Low Complexity: 1 Low   PT General Charges $$ ACUTE PT VISIT: 1 Visit         Corean Dade, PT, DPT, GCS [phone]   Maryuri Warnke 05/02/2023, 2:40 PM

## 2023-05-02 NOTE — Progress Notes (Addendum)
      Daily Progress Note   Assessment/Planning:   POD #1 s/p RRo  Pt appears confused this AM Pt has diseased but intact 2-v runoff: no intervention possible Podiatry already consulted   Subjective  - 1 Day Post-Op   confused   Objective   Vitals:   05/01/23 1556 05/01/23 2101 05/01/23 2347 05/02/23 0905  BP: (!) 112/55 127/63 124/61 131/65  Pulse: 72 71 65 67  Resp: 12 18 20 16   Temp: 98 F (36.7 C) 97.6 F (36.4 C) 98 F (36.7 C) (!) 97.5 F (36.4 C)  TempSrc: Oral     SpO2: 97% 100% 99% 100%  Weight: 75.9 kg     Height:         Intake/Output Summary (Last 24 hours) at 05/02/2023 0943 Last data filed at 05/02/2023 0546 Gross per 24 hour  Intake 590.31 ml  Output --  Net 590.31 ml    GEN Confused, wax/waning mental status  VASC R foot: began crying when I tried to evaluate R foot L groin: pt refused removal of pressure dressing    Laboratory   CBC    Latest Ref Rng & Units 05/02/2023    1:13 AM 05/01/2023    8:38 AM 04/30/2023    8:29 PM  CBC  WBC 4.0 - 10.5 K/uL 8.4  8.6  7.3   Hemoglobin 12.0 - 15.0 g/dL 7.5  8.3  8.3   Hematocrit 36.0 - 46.0 % 23.8  25.4  25.2   Platelets 150 - 400 K/uL 366  380  372     BMET    Component Value Date/Time   NA 137 05/02/2023 0113   NA 142 03/29/2019 0000   K 4.2 05/02/2023 0113   CL 109 05/02/2023 0113   CO2 21 (L) 05/02/2023 0113   GLUCOSE 172 (H) 05/02/2023 0113   BUN 40 (H) 05/02/2023 0113   BUN 12 03/29/2019 0000   CREATININE 1.67 (H) 05/02/2023 0113   CALCIUM  8.4 (L) 05/02/2023 0113   GFRNONAA 33 (L) 05/02/2023 0113   GFRAA 18 (L) 03/27/2017 0449     Redell Door, MD, FACS, FSVS Covering for [facility]: [phone]  05/02/2023, 9:43 AM   Addendum  Pt more orientated when I came back.  L groin: no hematoma, no PSA, no thrill R foot: 1st distal phalange dry gangrene, 2nd distal phalange dry gangrene involving most of phalange, 3-5th toes: flakes of ischemia RLE:  no palpable pulses  - No additional revascularization needed -  Further interventions on foot per Podiatry  Redell Door, MD, FACS, FSVS Covering for [facility]: [phone]  05/02/2023, 10:51 AM

## 2023-05-03 DIAGNOSIS — I96 Gangrene, not elsewhere classified: Secondary | ICD-10-CM | POA: Diagnosis not present

## 2023-05-03 LAB — CBC WITH DIFFERENTIAL/PLATELET
Abs Immature Granulocytes: 0.04 10*3/uL (ref 0.00–0.07)
Basophils Absolute: 0 10*3/uL (ref 0.0–0.1)
Basophils Relative: 0 %
Eosinophils Absolute: 0.7 10*3/uL — ABNORMAL HIGH (ref 0.0–0.5)
Eosinophils Relative: 10 %
HCT: 23.5 % — ABNORMAL LOW (ref 36.0–46.0)
Hemoglobin: 7.6 g/dL — ABNORMAL LOW (ref 12.0–15.0)
Immature Granulocytes: 1 %
Lymphocytes Relative: 32 %
Lymphs Abs: 2.2 10*3/uL (ref 0.7–4.0)
MCH: 30.4 pg (ref 26.0–34.0)
MCHC: 32.3 g/dL (ref 30.0–36.0)
MCV: 94 fL (ref 80.0–100.0)
Monocytes Absolute: 0.9 10*3/uL (ref 0.1–1.0)
Monocytes Relative: 13 %
Neutro Abs: 3.1 10*3/uL (ref 1.7–7.7)
Neutrophils Relative %: 44 %
Platelets: 363 10*3/uL (ref 150–400)
RBC: 2.5 MIL/uL — ABNORMAL LOW (ref 3.87–5.11)
RDW: 13.2 % (ref 11.5–15.5)
WBC: 6.9 10*3/uL (ref 4.0–10.5)
nRBC: 0 % (ref 0.0–0.2)

## 2023-05-03 LAB — COMPREHENSIVE METABOLIC PANEL
ALT: 12 U/L (ref 0–44)
AST: 19 U/L (ref 15–41)
Albumin: 3.1 g/dL — ABNORMAL LOW (ref 3.5–5.0)
Alkaline Phosphatase: 42 U/L (ref 38–126)
Anion gap: 10 (ref 5–15)
BUN: 40 mg/dL — ABNORMAL HIGH (ref 8–23)
CO2: 22 mmol/L (ref 22–32)
Calcium: 8.7 mg/dL — ABNORMAL LOW (ref 8.9–10.3)
Chloride: 105 mmol/L (ref 98–111)
Creatinine, Ser: 1.64 mg/dL — ABNORMAL HIGH (ref 0.44–1.00)
GFR, Estimated: 34 mL/min — ABNORMAL LOW (ref >60)
Glucose, Bld: 118 mg/dL — ABNORMAL HIGH (ref 70–99)
Potassium: 4.5 mmol/L (ref 3.5–5.1)
Sodium: 137 mmol/L (ref 135–145)
Total Bilirubin: 0.6 mg/dL (ref 0.0–1.2)
Total Protein: 6.3 g/dL — ABNORMAL LOW (ref 6.5–8.1)

## 2023-05-03 LAB — GLUCOSE, CAPILLARY
Glucose-Capillary: 92 mg/dL (ref 70–99)
Glucose-Capillary: 149 mg/dL — ABNORMAL HIGH (ref 70–99)
Glucose-Capillary: 193 mg/dL — ABNORMAL HIGH (ref 70–99)
Glucose-Capillary: 130 mg/dL — ABNORMAL HIGH (ref 70–99)

## 2023-05-03 LAB — MAGNESIUM: Magnesium: 1.7 mg/dL (ref 1.7–2.4)

## 2023-05-03 LAB — MRSA NEXT GEN BY PCR, NASAL: MRSA by PCR Next Gen: NOT DETECTED

## 2023-05-03 LAB — PHOSPHORUS: Phosphorus: 2.9 mg/dL (ref 2.5–4.6)

## 2023-05-03 MED ORDER — AMOXICILLIN-POT CLAVULANATE 875-125 MG PO TABS
1.0000 | ORAL_TABLET | Freq: Two times a day (BID) | ORAL | Status: DC
  Administered 2023-05-03 – 2023-05-05 (×4): 1 via ORAL
  Filled 2023-05-03 (×4): qty 1

## 2023-05-03 MED ORDER — HALOPERIDOL LACTATE 5 MG/ML IJ SOLN
2.0000 mg | Freq: Once | INTRAMUSCULAR | Status: AC
  Administered 2023-05-03: 2 mg via INTRAVENOUS
  Filled 2023-05-03: qty 1

## 2023-05-03 MED ORDER — LORAZEPAM 2 MG/ML IJ SOLN
1.0000 mg | Freq: Once | INTRAMUSCULAR | Status: AC
  Administered 2023-05-03: 1 mg via INTRAVENOUS
  Filled 2023-05-03: qty 1

## 2023-05-03 NOTE — Plan of Care (Signed)

## 2023-05-03 NOTE — Progress Notes (Signed)
 OT Cancellation Note  Patient Details Name: NIKKITA ADEYEMI MRN: [id number] DOB: 04/14/1956   Cancelled Treatment:    Reason Eval/Treat Not Completed: OT screened, no needs identified, will sign off. Order received, chart reviewed. Per conversation with PT, pt at baseline functional independence. No skilled OT needs identified. Will sign off. Please re-consult if additional needs arise.    Elston Slot, M.S. OTR/L  05/03/23, 10:49 AM  ascom [phone]

## 2023-05-03 NOTE — TOC Progression Note (Signed)
 Transition of Care [facility]) - Progression Note    Patient Details  Name: Laurie Lin MRN: [id number] Date of Birth: 1955-08-13  Transition of Care [facility]) CM/SW Contact  Odella Ku, RN Phone Number: 05/03/2023, 11:46 AM  Clinical Narrative:     Call placed to husband to discuss discharge planning, no answer, message left.   Expected Discharge Plan: Assisted Living (with home health) Barriers to Discharge: Continued Medical Work up  Expected Discharge Plan and Services     Post Acute Care Choice:  (TBD) Living arrangements for the past 2 months: Assisted Living Facility                                       Social Determinants of Health (SDOH) Interventions SDOH Screenings   Food Insecurity: No Food Insecurity (04/30/2023)  Housing: Unknown (04/30/2023)  Transportation Needs: Patient Unable To Answer (04/30/2023)  Utilities: Patient Unable To Answer (04/30/2023)  Depression (PHQ2-9): Low Risk  (02/12/2023)  Recent Concern: Depression (PHQ2-9) - High Risk (01/09/2023)  Financial Resource Strain: Low Risk  (02/11/2023)  Physical Activity: Unknown (02/11/2023)  Social Connections: Patient Unable To Answer (04/30/2023)  Recent Concern: Social Connections - Moderately Isolated (02/11/2023)  Stress: Stress Concern Present (02/11/2023)  Tobacco Use: Low Risk  (04/29/2023)    Readmission Risk Interventions    04/30/2023   12:40 PM 05/23/2022   10:51 AM 05/07/2022    9:48 AM  Readmission Risk Prevention Plan  Transportation Screening Complete Complete Complete  PCP or Specialist Appt within 3-5 Days Complete Complete Complete  Social Work Consult for Recovery Care Planning/Counseling Complete Complete Complete  Palliative Care Screening Not Applicable Not Applicable Not Applicable  Medication Review Oceanographer) Complete Complete Complete

## 2023-05-03 NOTE — Progress Notes (Signed)
 PODIATRY PROGRESS NOTE Patient Name: Laurie Lin  DOB 12/18/1955 DOA 04/29/2023  Hospital Day: 5  Assessment:  68 y.o. female with PMHx significant for DM2 with neuropathy with superficial gangrenous changes to the distal aspect of the right second toe.  X-ray without evidence of osteomyelitis.  Status post right lower extremity angiogram 05/01/2023 with Dr. Marea without intervention, small vessel disease in the foot and ankle but no focal stenosis   WBC 6.9 CRP 2.3 ESR 56 MRI right foot: Mild marrow abnormality within the distal first and second digit phalanges, nonspecific reactive versus early osteo necrosis.  No cortical destruction or T1 marrow abnormalities to suggest osteomyelitis.     Plan:  -No current surgical plans at this time will allow for demarcation of any necrosis given absence of frank infection in the second toe.  Patient may need amputation of the second toe in the future however no indication to do so at this time given lack of obvious infection on MRI and clinically.  -Status post angiogram per vascular appreciate assistance, no evidence of focal stenosis but small vessel disease in the foot.  I question possible embolic event to the second toe arterial supply.  -Recommend ongoing monitoring of the first and second toe in the office as an outpatient. Follow up in [place] office in 2 weeks.  Patient stable for discharge from my standpoint. -Will sign off at this time.        Marolyn JULIANNA Honour, DPM [facility]    Subjective:  Discussed findings and plan with patient. She is relieved I am not recommending urgent amputation of 2nd toe. She would prefer to monitor it as an outpatient.   Objective:   Vitals:   05/02/23 2212 05/03/23 0447  BP: (!) 126/58 (!) 119/59  Pulse: 74 65  Resp: 18 16  Temp: 98.4 F (36.9 C) 98.7 F (37.1 C)  SpO2: 100% 99%       Latest Ref Rng & Units 05/03/2023    4:39 AM 05/02/2023    1:13 AM 05/01/2023    8:38  AM  CBC  WBC 4.0 - 10.5 K/uL 6.9  8.4  8.6   Hemoglobin 12.0 - 15.0 g/dL 7.6  7.5  8.3   Hematocrit 36.0 - 46.0 % 23.5  23.8  25.4   Platelets 150 - 400 K/uL 363  366  380        Latest Ref Rng & Units 05/03/2023    4:39 AM 05/02/2023    1:13 AM 05/01/2023    8:38 AM  BMP  Glucose 70 - 99 mg/dL 881  827  894   BUN 8 - 23 mg/dL 40  40  43   Creatinine 0.44 - 1.00 mg/dL 8.35  8.32  8.35   Sodium 135 - 145 mmol/L 137  137  139   Potassium 3.5 - 5.1 mmol/L 4.5  4.2  4.2   Chloride 98 - 111 mmol/L 105  109  109   CO2 22 - 32 mmol/L 22  21  20    Calcium  8.9 - 10.3 mg/dL 8.7  8.4  8.7     General: AAOx3, NAD  Lower Extremity Exam Lower Extremity Exam Vasc:     R -                L - PT 1/4  palpable, DP 1/4  palpable. Cap refill <3 sec to digits   Derm:    R -superficial dry gangrenous changes  to the distal aspect of the second toe as well as the medial aspect of the right hallux.  No significant erythema or edema at this time.  No open draining wounds.                        L - Normal temp/texture/turgor with no open lesion or clinical signs of infection   MSK:     R -mild edema of the forefoot               L -  No gross deformities. Compartments soft, non-tender, compressible   Neuro:R - Gross sensation absent. Gross motor function intact                       L - Gross sensation absent. Gross motor function intact     Radiology:  Results reviewed. See assessment for pertinent imaging results

## 2023-05-03 NOTE — Progress Notes (Signed)
 PROGRESS NOTE    Laurie Lin  FMW:[id number] DOB: 09/23/1955 DOA: 04/29/2023 PCP: Vincente Saber, NP  Chief Complaint  Patient presents with   [facility] Course:  Laurie Lin is 68 y.o. female with prior diagnosis of Warnicke Korsakoff encephalopathy, CKD stage IV, type 1 diabetes, recent admission for Mallory-Weiss tear and grade D esophagitis seen on EGD.  She is brought to the ED on this admission with abdominal pain and vomiting as well as concerns of bleeding from the right second toe.  Patient is unable to provide history due to her chronic encephalopathy.  In the ED she was found to have be tachycardic, otherwise normal vital signs.  WBC 11.9, negative respiratory viral panel, CT head and C-spine without acute changes.  Chest x-ray without active disease.  Hip x-ray without fracture.  Right foot x-ray negative for signs of osteomyelitis.  CT abdomen pelvis without acute changes.  She was started on broad-spectrum antibiotics for presumed cellulitis/wound infection and admitted to TRH service.   Subjective: Some chest pain overnight, has resolved now. Her husband is at bedside today and Ms. Bostick is happy to have him here. She denies any acute issues at this time.  No pain.  Objective: Vitals:   05/02/23 0905 05/02/23 1605 05/02/23 2212 05/03/23 0447  BP: 131/65 (!) 133/49 (!) 126/58 (!) 119/59  Pulse: 67 75 74 65  Resp: 16 18 18 16   Temp: (!) 97.5 F (36.4 C) 98.8 F (37.1 C) 98.4 F (36.9 C) 98.7 F (37.1 C)  TempSrc:   Oral Oral  SpO2: 100% 98% 100% 99%  Weight:      Height:        Intake/Output Summary (Last 24 hours) at 05/03/2023 0813 Last data filed at 05/02/2023 2300 Gross per 24 hour  Intake 200 ml  Output --  Net 200 ml    Filed Weights   04/29/23 1758 05/01/23 1556  Weight: 74.8 kg 75.9 kg    Examination: General exam: Appears calm and comfortable, NAD  Respiratory system: No work of breathing, symmetric chest wall  expansion Cardiovascular system: S1 & S2 heard, RRR.  Gastrointestinal system: Abdomen is nondistended, soft and nontender.  Neuro: Alert and confused, requires reorientation. Skin: Gangrenous changes to the second toe of right foot, thickened skin, flaking at distal endpoints, thick toenails, black discoloration of distal aspect of toe. no significant surrounding erythema.  No active oozing or bleeding. Psychiatry: Demonstrates appropriate judgement and insight. Mood & affect appropriate for situation.   Assessment & Plan:  Principal Problem:   Gangrene of toe of right foot (HCC) Active Problems:   History of hematemesis   History of hematemesis, grade D esophagitis on EGD 04/2022   Abdominal pain and vomiting   Chronic disease anemia   Diabetic peripheral neuropathy (HCC)   Type 1 diabetes mellitus (HCC)   Chronic kidney disease, stage IV (severe) (HCC)   Wernicke-Korsakoff syndrome (HCC)    Green right foot second toe Diabetic peripheral neuropathy - MRI foot obtained: No obvious osteomyelitis.  Possible osteonecrosis early stages. - Elevated ESR, minimally elevated CRP - ABIs ordered but unable to be calculated due to stenosis. - Weightbearing as tolerated - Continue with vancomycin  and Rocephin . MRSA PCR still pending, have reached out to RN again -- Will de-escalate antibiotics to Augmentin  for now - Podiatry consulted, Recommends close monitoring for now and outpatient follow-up.  No urgent need for amputation.  Questionable embolic event to the second toe arterial supply. --Vascular  consulted.  Status post angiogram on 1/10.  No acute revascularization needed - Was previously seeing podiatry at [facility] in 2023  UTI Culture growing Citrobacter with resistance to Bactrim  and ciprofloxacin - s/p 3 days ceftriaxone  for now  Abdominal pain and vomiting - History of Mallory-Weiss tear 1 yr ago - Known grade D esophagitis - History of dysphagia requiring PEG tube -- CT abdomen  pelvis nonacute - LFTs within normal limits. - Lipase unremarkable - Currently on IV Protonix  and antiemetics - Hemoglobin appears to be downtrending, repeat CBC stable. Continue to monitor  Acute on chronic anemia - Anemia of CKD, unclear baseline.  Hemoglobin downtrending now.  Possible GI etiology given history - If hemoglobin continues to downtrend will consider GI consultation  Type 1 diabetes, uncontrolled - Hemoglobin A1c 9.2% - Follows with endocrinology outpatient - Continue with basal bolus insulin  for now, titrate up as tolerated  CKD stage IV - Renal function appears to be at her baseline - Avoid nephrotoxic meds when possible.  Renally dose when needed  Warnicke Korsakoff syndrome - Continue frequent reorientation, delirium precautions - Discussed care with husband. -- Resides at [facility] memory care -- Atarax  as needed for anxiety  DVT prophylaxis: Open   Code Status: Full Code Family Communication: Discussed with husband who is at bedside.  Disposition:  Status is: inpatient, will need to discharge home with home health back to assisted living memory care unit.  Home health orders have been placed.  TOC working on arrangements  Consultants:    Treatment Team:  Consulting Physician: Malvin Marsa FALCON, DPM  Procedures:  Aortogram/Angiogram 1/10  Antimicrobials:  Anti-infectives (From admission, onward)    Start     Dose/Rate Route Frequency Ordered Stop   05/01/23 2200  vancomycin  (VANCOREADY) IVPB 750 mg/150 mL        750 mg 150 mL/hr over 60 Minutes Intravenous Every 24 hours 05/01/23 0934     05/01/23 1200  vancomycin  (VANCOCIN ) IVPB 1000 mg/200 mL premix  Status:  Discontinued        1,000 mg 200 mL/hr over 60 Minutes Intravenous Every 36 hours 04/30/23 0218 05/01/23 0934   05/01/23 0849  ceFAZolin  (ANCEF ) IVPB 2g/100 mL premix  Status:  Discontinued        2 g 200 mL/hr over 30 Minutes Intravenous 30 min pre-op 05/01/23 0849 05/01/23 1103    04/30/23 2200  cefTRIAXone  (ROCEPHIN ) 1 g in sodium chloride  0.9 % 100 mL IVPB        1 g 200 mL/hr over 30 Minutes Intravenous Every 24 hours 04/30/23 0031     04/29/23 2345  vancomycin  (VANCOREADY) IVPB 1750 mg/350 mL        1,750 mg 175 mL/hr over 120 Minutes Intravenous  Once 04/29/23 2334 04/30/23 0443   04/29/23 2330  metroNIDAZOLE  (FLAGYL ) IVPB 500 mg        500 mg 100 mL/hr over 60 Minutes Intravenous  Once 04/29/23 2316 04/30/23 0108   04/29/23 2145  cefTRIAXone  (ROCEPHIN ) 1 g in sodium chloride  0.9 % 100 mL IVPB        1 g 200 mL/hr over 30 Minutes Intravenous  Once 04/29/23 2143 04/29/23 2349       Data Reviewed: I have personally reviewed following labs and imaging studies CBC: Recent Labs  Lab 04/30/23 0148 04/30/23 2029 05/01/23 0838 05/02/23 0113 05/03/23 0439  WBC 8.4 7.3 8.6 8.4 6.9  NEUTROABS  --   --  4.5 4.3 3.1  HGB 7.9* 8.3* 8.3*  7.5* 7.6*  HCT 24.8* 25.2* 25.4* 23.8* 23.5*  MCV 96.1 94.0 92.4 96.7 94.0  PLT 360 372 380 366 363   Basic Metabolic Panel: Recent Labs  Lab 04/29/23 1801 04/30/23 0148 05/01/23 0838 05/02/23 0113 05/03/23 0439  NA 139  --  139 137 137  K 4.8  --  4.2 4.2 4.5  CL 105  --  109 109 105  CO2 23  --  20* 21* 22  GLUCOSE 155*  --  105* 172* 118*  BUN 69*  --  43* 40* 40*  CREATININE 2.36* 2.18* 1.64* 1.67* 1.64*  CALCIUM  9.1  --  8.7* 8.4* 8.7*  MG  --   --   --  1.6* 1.7  PHOS  --   --   --  3.0 2.9   GFR: Estimated Creatinine Clearance: 35.5 mL/min (A) (by C-G formula based on SCr of 1.64 mg/dL (H)). Liver Function Tests: Recent Labs  Lab 04/30/23 0148 05/01/23 0838 05/02/23 0113 05/03/23 0439  AST 16 18 14* 19  ALT 13 11 10 12   ALKPHOS 40 41 43 42  BILITOT 0.7 0.3 0.4 0.6  PROT 6.6 6.4* 6.3* 6.3*  ALBUMIN 3.2* 3.1* 2.9* 3.1*   CBG: Recent Labs  Lab 05/02/23 0902 05/02/23 1147 05/02/23 1603 05/02/23 2206 05/03/23 0755  GLUCAP 137* 208* 208* 125* 92    Recent Results (from the past 240 hours)   Urine Culture     Status: Abnormal   Collection Time: 04/29/23  8:06 PM   Specimen: Urine, Random  Result Value Ref Range Status   Specimen Description   Final    URINE, RANDOM Performed at [facility], [address], [place], [place] [address]    Special Requests   Final    NONE Performed at [facility], [address] [place], [place] [address]    Culture 80,000 COLONIES/mL CITROBACTER FREUNDII (A)  Final   Report Status 05/02/2023 FINAL  Final   Organism ID, Bacteria CITROBACTER FREUNDII (A)  Final      Susceptibility   Citrobacter freundii - MIC*    CEFEPIME  <=0.12 SENSITIVE Sensitive     CEFTRIAXONE  <=0.25 SENSITIVE Sensitive     CIPROFLOXACIN >=4 RESISTANT Resistant     GENTAMICIN <=1 SENSITIVE Sensitive     IMIPENEM 1 SENSITIVE Sensitive     NITROFURANTOIN  <=16 SENSITIVE Sensitive     TRIMETH /SULFA  >=320 RESISTANT Resistant     PIP/TAZO 8 SENSITIVE Sensitive ug/mL    * 80,000 COLONIES/mL CITROBACTER FREUNDII  Resp panel by RT-PCR (RSV, Flu A&B, Covid) Anterior Nasal Swab     Status: None   Collection Time: 04/29/23  8:47 PM   Specimen: Anterior Nasal Swab  Result Value Ref Range Status   SARS Coronavirus 2 by RT PCR NEGATIVE NEGATIVE Final    Comment: (NOTE) SARS-CoV-2 target nucleic acids are NOT DETECTED.  The SARS-CoV-2 RNA is generally detectable in upper respiratory specimens during the acute phase of infection. The lowest concentration of SARS-CoV-2 viral copies this assay can detect is 138 copies/mL. A negative result does not preclude SARS-Cov-2 infection and should not be used as the sole basis for treatment or other patient management decisions. A negative result may occur with  improper specimen collection/handling, submission of specimen other than nasopharyngeal swab, presence of viral mutation(s) within the areas targeted by this assay, and inadequate number of viral copies(<138 copies/mL). A negative result must be combined  with clinical observations, patient history, and epidemiological information. The expected  result is Negative.  Fact Sheet for Patients:  bloggercourse.com  Fact Sheet for Healthcare Providers:  seriousbroker.it  This test is no t yet approved or cleared by the [place]  FDA and  has been authorized for detection and/or diagnosis of SARS-CoV-2 by FDA under an Emergency Use Authorization (EUA). This EUA will remain  in effect (meaning this test can be used) for the duration of the COVID-19 declaration under Section 564(b)(1) of the Act, 21 U.S.C.section 360bbb-3(b)(1), unless the authorization is terminated  or revoked sooner.       Influenza A by PCR NEGATIVE NEGATIVE Final   Influenza B by PCR NEGATIVE NEGATIVE Final    Comment: (NOTE) The Xpert Xpress SARS-CoV-2/FLU/RSV plus assay is intended as an aid in the diagnosis of influenza from Nasopharyngeal swab specimens and should not be used as a sole basis for treatment. Nasal washings and aspirates are unacceptable for Xpert Xpress SARS-CoV-2/FLU/RSV testing.  Fact Sheet for Patients: bloggercourse.com  Fact Sheet for Healthcare Providers: seriousbroker.it  This test is not yet approved or cleared by the [place]  FDA and has been authorized for detection and/or diagnosis of SARS-CoV-2 by FDA under an Emergency Use Authorization (EUA). This EUA will remain in effect (meaning this test can be used) for the duration of the COVID-19 declaration under Section 564(b)(1) of the Act, 21 U.S.C. section 360bbb-3(b)(1), unless the authorization is terminated or revoked.     Resp Syncytial Virus by PCR NEGATIVE NEGATIVE Final    Comment: (NOTE) Fact Sheet for Patients: bloggercourse.com  Fact Sheet for Healthcare Providers: seriousbroker.it  This test is not yet approved  or cleared by the [place]  FDA and has been authorized for detection and/or diagnosis of SARS-CoV-2 by FDA under an Emergency Use Authorization (EUA). This EUA will remain in effect (meaning this test can be used) for the duration of the COVID-19 declaration under Section 564(b)(1) of the Act, 21 U.S.C. section 360bbb-3(b)(1), unless the authorization is terminated or revoked.  Performed at [facility], [address], [place], [place] [address]      Radiology Studies: PERIPHERAL VASCULAR CATHETERIZATION Result Date: 05/01/2023 See surgical note for result.   Scheduled Meds:  vitamin C   500 mg Oral BID   vitamin D3  2,000 Units Oral Daily   cyanocobalamin   1,000 mcg Oral Daily   feeding supplement  237 mL Oral BID BM   heparin   5,000 Units Subcutaneous Q8H   insulin  aspart  0-5 Units Subcutaneous QHS   insulin  aspart  0-9 Units Subcutaneous TID WC   insulin  glargine-yfgn  8 Units Subcutaneous QHS   multivitamin with minerals  1 tablet Oral Daily   pantoprazole   40 mg Oral Daily   risperiDONE   1 mg Oral QHS   sertraline   50 mg Oral QHS   thiamine   100 mg Oral Daily   Continuous Infusions:  cefTRIAXone  (ROCEPHIN )  IV 1 g (05/02/23 2259)   vancomycin  750 mg (05/02/23 2300)     LOS: 4 days    Time spent:  55min  Elysha Daw, DO [facility]  To contact the attending physician between 7A-7P please use Epic Chat. To contact the covering physician during after hours 7P-7A, please review Amion.   05/03/2023, 8:13 AM   *This document has been created with the assistance of dictation software. Please excuse typographical errors. *

## 2023-05-03 NOTE — Progress Notes (Signed)
 Pt complained of chest pain at 4:45. RN evaluated the patient and vitals were taken. On-Call provider was notified with the following information:   Pt complained of chest pain of an 8. She was unable to fully describe the nature of her pain. At first she pointed her chest but then later she also pointed her L pectoralis muscle. She has had panic attack yesterday and received ativan . When I examined her she was not in any visible distress. She was not grimacing or curling up holding chest. I took her vitals and gave her Atarax  and Narco. Her vitals look stable. H/H is slightly on the lower side and her Mg is 1.6..  Provider instructed RN to notify of Mag labs drawn today. At 6:20 RN updated the on-call provider or no labs being posted. Per RN finding no labs were ordered on the patient. RN will continue to investigate further.

## 2023-05-03 NOTE — Progress Notes (Signed)
 MRSA swab sent to lab, lab label printer not available- patient sticker placed on specimen.

## 2023-05-04 DIAGNOSIS — I96 Gangrene, not elsewhere classified: Secondary | ICD-10-CM | POA: Diagnosis not present

## 2023-05-04 LAB — PHOSPHORUS: Phosphorus: 2.8 mg/dL (ref 2.5–4.6)

## 2023-05-04 LAB — CBC WITH DIFFERENTIAL/PLATELET
Abs Immature Granulocytes: 0.03 10*3/uL (ref 0.00–0.07)
Basophils Absolute: 0 10*3/uL (ref 0.0–0.1)
Basophils Relative: 1 %
Eosinophils Absolute: 0.6 10*3/uL — ABNORMAL HIGH (ref 0.0–0.5)
Eosinophils Relative: 10 %
HCT: 24.9 % — ABNORMAL LOW (ref 36.0–46.0)
Hemoglobin: 8 g/dL — ABNORMAL LOW (ref 12.0–15.0)
Immature Granulocytes: 1 %
Lymphocytes Relative: 28 %
Lymphs Abs: 1.8 10*3/uL (ref 0.7–4.0)
MCH: 29.7 pg (ref 26.0–34.0)
MCHC: 32.1 g/dL (ref 30.0–36.0)
MCV: 92.6 fL (ref 80.0–100.0)
Monocytes Absolute: 0.8 10*3/uL (ref 0.1–1.0)
Monocytes Relative: 13 %
Neutro Abs: 3.1 10*3/uL (ref 1.7–7.7)
Neutrophils Relative %: 47 %
Platelets: 417 10*3/uL — ABNORMAL HIGH (ref 150–400)
RBC: 2.69 MIL/uL — ABNORMAL LOW (ref 3.87–5.11)
RDW: 13.1 % (ref 11.5–15.5)
WBC: 6.4 10*3/uL (ref 4.0–10.5)
nRBC: 0 % (ref 0.0–0.2)

## 2023-05-04 LAB — MAGNESIUM: Magnesium: 1.7 mg/dL (ref 1.7–2.4)

## 2023-05-04 LAB — GLUCOSE, CAPILLARY
Glucose-Capillary: 78 mg/dL (ref 70–99)
Glucose-Capillary: 184 mg/dL — ABNORMAL HIGH (ref 70–99)
Glucose-Capillary: 94 mg/dL (ref 70–99)
Glucose-Capillary: 180 mg/dL — ABNORMAL HIGH (ref 70–99)

## 2023-05-04 LAB — COMPREHENSIVE METABOLIC PANEL
ALT: 10 U/L (ref 0–44)
AST: 18 U/L (ref 15–41)
Albumin: 3.1 g/dL — ABNORMAL LOW (ref 3.5–5.0)
Alkaline Phosphatase: 43 U/L (ref 38–126)
Anion gap: 9 (ref 5–15)
BUN: 37 mg/dL — ABNORMAL HIGH (ref 8–23)
CO2: 22 mmol/L (ref 22–32)
Calcium: 8.7 mg/dL — ABNORMAL LOW (ref 8.9–10.3)
Chloride: 106 mmol/L (ref 98–111)
Creatinine, Ser: 1.69 mg/dL — ABNORMAL HIGH (ref 0.44–1.00)
GFR, Estimated: 33 mL/min — ABNORMAL LOW (ref >60)
Glucose, Bld: 74 mg/dL (ref 70–99)
Potassium: 4.4 mmol/L (ref 3.5–5.1)
Sodium: 137 mmol/L (ref 135–145)
Total Bilirubin: 0.7 mg/dL (ref 0.0–1.2)
Total Protein: 6.3 g/dL — ABNORMAL LOW (ref 6.5–8.1)

## 2023-05-04 MED ORDER — ALUM & MAG HYDROXIDE-SIMETH 200-200-20 MG/5ML PO SUSP
30.0000 mL | ORAL | Status: DC | PRN
  Filled 2023-05-04: qty 30

## 2023-05-04 MED ORDER — CALCIUM CARBONATE ANTACID 500 MG PO CHEW
1.0000 | CHEWABLE_TABLET | Freq: Once | ORAL | Status: AC
  Administered 2023-05-04: 200 mg via ORAL
  Filled 2023-05-04: qty 1

## 2023-05-04 NOTE — Inpatient Diabetes Management (Signed)
 Inpatient Diabetes Program Recommendations  AACE/ADA: New Consensus Statement on Inpatient Glycemic Control   Target Ranges:  Prepandial:   less than 140 mg/dL      Peak postprandial:   less than 180 mg/dL (1-2 hours)      Critically ill patients:  140 - 180 mg/dL    Latest Reference Range & Units 05/04/23 04:42  Glucose 70 - 99 mg/dL 74    Latest Reference Range & Units 05/03/23 07:55 05/03/23 12:15 05/03/23 16:50 05/03/23 20:34  Glucose-Capillary 70 - 99 mg/dL 92 850 (H) 806 (H) 869 (H)    Review of Glycemic Control  Diabetes history: DM1 (does NOT make any insulin ; requires basal, correction, and carb coverage insulin ) Outpatient Diabetes medications: Basaglar  8 units daily, Novolog  0-9 units TID with meals Current orders for Inpatient glycemic control: Semglee  8 units at bedtime, Novolog  0-9 units TID with meals, Novolog  0-5 units QHS   Inpatient Diabetes Program Recommendations:    Insulin : Fasting CBG 92 mg/dl on 8/87 and glucose 74 mg/dl on labs today. Please consider slightly decreasing Semglee  to 7 units at bedtime.  Thanks, Earnie Gainer, RN, MSN, CDCES Diabetes Coordinator Inpatient Diabetes Program [phone] (Team Pager from 8am to 5pm)

## 2023-05-04 NOTE — Plan of Care (Signed)
   Problem: Health Behavior/Discharge Planning: Goal: Ability to manage health-related needs will improve Outcome: Progressing   Problem: Clinical Measurements: Goal: Will remain free from infection Outcome: Progressing

## 2023-05-04 NOTE — Progress Notes (Signed)
 Pt non-skid socks adhering to 1st/2nd toe on R foot, xeroform and kerlex applied and socks re-donned, will monitor

## 2023-05-04 NOTE — TOC Progression Note (Signed)
 Transition of Care [facility]) - Progression Note    Patient Details  Name: Laurie Lin MRN: [id number] Date of Birth: Jan 13, 1956  Transition of Care [facility] CM/SW Contact  Royanne JINNY Bernheim, RN Phone Number: 05/04/2023, 11:51 AM  Clinical Narrative:    Reached out to [facility], Noone answered, I called the main number and asked about taking the patient back, they forwarded me to memory care number and again noone answered after mulitple rings until the phone went dead, will attmept again   Expected Discharge Plan: Assisted Living (with home health) Barriers to Discharge: Continued Medical Work up  Expected Discharge Plan and Services     Post Acute Care Choice:  (TBD) Living arrangements for the past 2 months: Assisted Living Facility                                       Social Determinants of Health (SDOH) Interventions SDOH Screenings   Food Insecurity: No Food Insecurity (04/30/2023)  Housing: Unknown (04/30/2023)  Transportation Needs: Patient Unable To Answer (04/30/2023)  Utilities: Patient Unable To Answer (04/30/2023)  Depression (PHQ2-9): Low Risk  (02/12/2023)  Recent Concern: Depression (PHQ2-9) - High Risk (01/09/2023)  Financial Resource Strain: Low Risk  (02/11/2023)  Physical Activity: Unknown (02/11/2023)  Social Connections: Patient Unable To Answer (04/30/2023)  Recent Concern: Social Connections - Moderately Isolated (02/11/2023)  Stress: Stress Concern Present (02/11/2023)  Tobacco Use: Low Risk  (04/29/2023)    Readmission Risk Interventions    04/30/2023   12:40 PM 05/23/2022   10:51 AM 05/07/2022    9:48 AM  Readmission Risk Prevention Plan  Transportation Screening Complete Complete Complete  PCP or Specialist Appt within 3-5 Days Complete Complete Complete  Social Work Consult for Recovery Care Planning/Counseling Complete Complete Complete  Palliative Care Screening Not Applicable Not Applicable Not Applicable  Medication Review Special Educational Needs Teacher) Complete Complete Complete

## 2023-05-04 NOTE — TOC Progression Note (Addendum)
 Transition of Care [facility]) - Progression Note    Patient Details  Name: Laurie Lin MRN: [id number] Date of Birth: [birth year]  Transition of Care [facility]) CM/SW Contact  Royanne JINNY Bernheim, RN Phone Number: 05/04/2023, 1:29 PM  Clinical Narrative:    [facility] with [facility]) she stated they will need notes concerning the foot and what care she will have for it, will need faxed to [phone] Sent fax  Expected Discharge Plan: Assisted Living (with home health) Barriers to Discharge: Continued Medical Work up  Expected Discharge Plan and Services     Post Acute Care Choice:  (TBD) Living arrangements for the past 2 months: Assisted Living Facility                                       Social Determinants of Health (SDOH) Interventions SDOH Screenings   Food Insecurity: No Food Insecurity (04/30/2023)  Housing: Unknown (04/30/2023)  Transportation Needs: Patient Unable To Answer (04/30/2023)  Utilities: Patient Unable To Answer (04/30/2023)  Depression (PHQ2-9): Low Risk  (02/12/2023)  Recent Concern: Depression (PHQ2-9) - High Risk (01/09/2023)  Financial Resource Strain: Low Risk  (02/11/2023)  Physical Activity: Unknown (02/11/2023)  Social Connections: Patient Unable To Answer (04/30/2023)  Recent Concern: Social Connections - Moderately Isolated (02/11/2023)  Stress: Stress Concern Present (02/11/2023)  Tobacco Use: Low Risk  (04/29/2023)    Readmission Risk Interventions    04/30/2023   12:40 PM 05/23/2022   10:51 AM 05/07/2022    9:48 AM  Readmission Risk Prevention Plan  Transportation Screening Complete Complete Complete  PCP or Specialist Appt within 3-5 Days Complete Complete Complete  Social Work Consult for Recovery Care Planning/Counseling Complete Complete Complete  Palliative Care Screening Not Applicable Not Applicable Not Applicable  Medication Review Oceanographer) Complete Complete Complete

## 2023-05-04 NOTE — Progress Notes (Signed)
 PROGRESS NOTE    Laurie Lin  FMW:[id number] DOB: 1955/10/25 DOA: 04/29/2023 PCP: Vincente Saber, NP  Chief Complaint  Patient presents with   [facility] Course:  [name] is 68 y.o. female with prior diagnosis of Warnicke Korsakoff encephalopathy, CKD stage IV, type 1 diabetes, recent admission for Mallory-Weiss tear and grade D esophagitis seen on EGD.  She is brought to the ED on this admission with abdominal pain and vomiting as well as concerns of bleeding from the right second toe.  Patient is unable to provide history due to her chronic encephalopathy.  In the ED she was found to have be tachycardic, otherwise normal vital signs.  WBC 11.9, negative respiratory viral panel, CT head and C-spine without acute changes.  Chest x-ray without active disease.  Hip x-ray without fracture.  Right foot x-ray negative for signs of osteomyelitis.  CT abdomen pelvis without acute changes.  She was started on broad-spectrum antibiotics for presumed cellulitis/wound infection and admitted to TRH service.   Subjective: No acute issues this morning.  Patient is anxious to return to her assisted living.  Objective: Vitals:   05/04/23 0336 05/04/23 0750 05/04/23 0751 05/04/23 1606  BP:  137/66 137/66 130/61  Pulse:  66 66 74  Resp:  18 18 17   Temp:   98.5 F (36.9 C) 98 F (36.7 C)  TempSrc:   Oral Oral  SpO2:  99% 99% 100%  Weight: 78.7 kg     Height:        Intake/Output Summary (Last 24 hours) at 05/04/2023 1750 Last data filed at 05/04/2023 1230 Gross per 24 hour  Intake 240 ml  Output --  Net 240 ml    Filed Weights   04/29/23 1758 05/01/23 1556 05/04/23 0336  Weight: 74.8 kg 75.9 kg 78.7 kg    Examination: General exam: Appears calm and comfortable, NAD  Respiratory system: No work of breathing, symmetric chest wall expansion Cardiovascular system: S1 & S2 heard, RRR.  Gastrointestinal system: Abdomen is nondistended, soft and nontender.  Neuro: Alert and  confused, requires reorientation. Skin: Gangrenous changes to the second toe of right foot, thickened skin, flaking at distal endpoints, thick toenails, black discoloration of distal aspect of toe. no significant surrounding erythema.  No active oozing or bleeding. Psychiatry: Demonstrates appropriate judgement and insight. Mood & affect appropriate for situation.   Assessment & Plan:  Principal Problem:   Gangrene of toe of right foot (HCC) Active Problems:   History of hematemesis   History of hematemesis, grade D esophagitis on EGD 04/2022   Abdominal pain and vomiting   Chronic disease anemia   Diabetic peripheral neuropathy (HCC)   Type 1 diabetes mellitus (HCC)   Chronic kidney disease, stage IV (severe) (HCC)   Wernicke-Korsakoff syndrome (HCC)    Green right foot second toe Diabetic peripheral neuropathy - MRI foot obtained: No obvious osteomyelitis.  Possible osteonecrosis early stages. - Elevated ESR, minimally elevated CRP - ABIs ordered but unable to be calculated due to stenosis. - Weightbearing as tolerated - Continue Augmentin  - Podiatry consulted, Recommends close monitoring for now and outpatient follow-up.  No urgent need for amputation.  Questionable embolic event to the second toe arterial supply. - No special wound care, keep clean and dry.  Band-Aid and sock. --Vascular consulted.  Status post angiogram on 1/10.  No acute revascularization needed - Was previously seeing podiatry at [facility] in 2023  UTI Culture growing Citrobacter with resistance to Bactrim  and  ciprofloxacin - s/p 3 days ceftriaxone  for now  Abdominal pain and vomiting, resolved - History of Mallory-Weiss tear 1 yr ago - Known grade D esophagitis - History of dysphagia requiring PEG tube -- CT abdomen pelvis nonacute - LFTs within normal limits. - Lipase unremarkable - Currently on IV Protonix  and antiemetics - Hemoglobin appears to be downtrending, repeat CBC stable. Continue to  monitor  Acute on chronic anemia - Anemia of CKD, unclear baseline.  Hemoglobin downtrending now.  Possible GI etiology given history - If hemoglobin continues to downtrend will consider GI consultation  Type 1 diabetes, uncontrolled - Hemoglobin A1c 9.2% - Follows with endocrinology outpatient - Continue with basal bolus insulin  for now, titrate up as tolerated  CKD stage IV - Renal function appears to be at her baseline - Avoid nephrotoxic meds when possible.  Renally dose when needed  Warnicke Korsakoff syndrome - Continue frequent reorientation, delirium precautions - Discussed care with husband. -- Resides at [facility] care -- Atarax  as needed for anxiety  DVT prophylaxis: Open   Code Status: Full Code Family Communication: Discussed with husband who is at bedside.  Disposition:  Status is: inpatient,pending return to assisted living facility.  TOC working on it.  Consultants:    Treatment Team:  Consulting Physician: Malvin Marsa FALCON, DPM  Procedures:  Aortogram/Angiogram 1/10  Antimicrobials:  Anti-infectives (From admission, onward)    Start     Dose/Rate Route Frequency Ordered Stop   05/03/23 2200  amoxicillin -clavulanate (AUGMENTIN ) 875-125 MG per tablet 1 tablet        1 tablet Oral Every 12 hours 05/03/23 1533 05/10/23 2159   05/01/23 2200  vancomycin  (VANCOREADY) IVPB 750 mg/150 mL  Status:  Discontinued        750 mg 150 mL/hr over 60 Minutes Intravenous Every 24 hours 05/01/23 0934 05/03/23 1533   05/01/23 1200  vancomycin  (VANCOCIN ) IVPB 1000 mg/200 mL premix  Status:  Discontinued        1,000 mg 200 mL/hr over 60 Minutes Intravenous Every 36 hours 04/30/23 0218 05/01/23 0934   05/01/23 0849  ceFAZolin  (ANCEF ) IVPB 2g/100 mL premix  Status:  Discontinued        2 g 200 mL/hr over 30 Minutes Intravenous 30 min pre-op 05/01/23 0849 05/01/23 1103   04/30/23 2200  cefTRIAXone  (ROCEPHIN ) 1 g in sodium chloride  0.9 % 100 mL IVPB  Status:   Discontinued        1 g 200 mL/hr over 30 Minutes Intravenous Every 24 hours 04/30/23 0031 05/03/23 1533   04/29/23 2345  vancomycin  (VANCOREADY) IVPB 1750 mg/350 mL        1,750 mg 175 mL/hr over 120 Minutes Intravenous  Once 04/29/23 2334 04/30/23 0443   04/29/23 2330  metroNIDAZOLE  (FLAGYL ) IVPB 500 mg        500 mg 100 mL/hr over 60 Minutes Intravenous  Once 04/29/23 2316 04/30/23 0108   04/29/23 2145  cefTRIAXone  (ROCEPHIN ) 1 g in sodium chloride  0.9 % 100 mL IVPB        1 g 200 mL/hr over 30 Minutes Intravenous  Once 04/29/23 2143 04/29/23 2349       Data Reviewed: I have personally reviewed following labs and imaging studies CBC: Recent Labs  Lab 04/30/23 2029 05/01/23 0838 05/02/23 0113 05/03/23 0439 05/04/23 0442  WBC 7.3 8.6 8.4 6.9 6.4  NEUTROABS  --  4.5 4.3 3.1 3.1  HGB 8.3* 8.3* 7.5* 7.6* 8.0*  HCT 25.2* 25.4* 23.8* 23.5* 24.9*  MCV  94.0 92.4 96.7 94.0 92.6  PLT 372 380 366 363 417*   Basic Metabolic Panel: Recent Labs  Lab 04/29/23 1801 04/30/23 0148 05/01/23 0838 05/02/23 0113 05/03/23 0439 05/04/23 0442  NA 139  --  139 137 137 137  K 4.8  --  4.2 4.2 4.5 4.4  CL 105  --  109 109 105 106  CO2 23  --  20* 21* 22 22  GLUCOSE 155*  --  105* 172* 118* 74  BUN 69*  --  43* 40* 40* 37*  CREATININE 2.36* 2.18* 1.64* 1.67* 1.64* 1.69*  CALCIUM  9.1  --  8.7* 8.4* 8.7* 8.7*  MG  --   --   --  1.6* 1.7 1.7  PHOS  --   --   --  3.0 2.9 2.8   GFR: Estimated Creatinine Clearance: 34.5 mL/min (A) (by C-G formula based on SCr of 1.69 mg/dL (H)). Liver Function Tests: Recent Labs  Lab 04/30/23 0148 05/01/23 0838 05/02/23 0113 05/03/23 0439 05/04/23 0442  AST 16 18 14* 19 18  ALT 13 11 10 12 10   ALKPHOS 40 41 43 42 43  BILITOT 0.7 0.3 0.4 0.6 0.7  PROT 6.6 6.4* 6.3* 6.3* 6.3*  ALBUMIN 3.2* 3.1* 2.9* 3.1* 3.1*   CBG: Recent Labs  Lab 05/03/23 1650 05/03/23 2034 05/04/23 0745 05/04/23 1156 05/04/23 1655  GLUCAP 193* 130* 78 184* 94     Recent Results (from the past 240 hours)  Urine Culture     Status: Abnormal   Collection Time: 04/29/23  8:06 PM   Specimen: Urine, Random  Result Value Ref Range Status   Specimen Description   Final    URINE, RANDOM Performed at [facility], [address] [place], [place] [address]    Special Requests   Final    NONE Performed at [facility], [address] [place], [place] [address]    Culture 80,000 COLONIES/mL CITROBACTER FREUNDII (A)  Final   Report Status 05/02/2023 FINAL  Final   Organism ID, Bacteria CITROBACTER FREUNDII (A)  Final      Susceptibility   Citrobacter freundii - MIC*    CEFEPIME  <=0.12 SENSITIVE Sensitive     CEFTRIAXONE  <=0.25 SENSITIVE Sensitive     CIPROFLOXACIN >=4 RESISTANT Resistant     GENTAMICIN <=1 SENSITIVE Sensitive     IMIPENEM 1 SENSITIVE Sensitive     NITROFURANTOIN  <=16 SENSITIVE Sensitive     TRIMETH /SULFA  >=320 RESISTANT Resistant     PIP/TAZO 8 SENSITIVE Sensitive ug/mL    * 80,000 COLONIES/mL CITROBACTER FREUNDII  Resp panel by RT-PCR (RSV, Flu A&B, Covid) Anterior Nasal Swab     Status: None   Collection Time: 04/29/23  8:47 PM   Specimen: Anterior Nasal Swab  Result Value Ref Range Status   SARS Coronavirus 2 by RT PCR NEGATIVE NEGATIVE Final    Comment: (NOTE) SARS-CoV-2 target nucleic acids are NOT DETECTED.  The SARS-CoV-2 RNA is generally detectable in upper respiratory specimens during the acute phase of infection. The lowest concentration of SARS-CoV-2 viral copies this assay can detect is 138 copies/mL. A negative result does not preclude SARS-Cov-2 infection and should not be used as the sole basis for treatment or other patient management decisions. A negative result may occur with  improper specimen collection/handling, submission of specimen other than nasopharyngeal swab, presence of viral mutation(s) within the areas targeted by this assay, and inadequate number of viral copies(<138  copies/mL). A negative result must be combined with  clinical observations, patient history, and epidemiological information. The expected result is Negative.  Fact Sheet for Patients:  bloggercourse.com  Fact Sheet for Healthcare Providers:  seriousbroker.it  This test is no t yet approved or cleared by the [place]  FDA and  has been authorized for detection and/or diagnosis of SARS-CoV-2 by FDA under an Emergency Use Authorization (EUA). This EUA will remain  in effect (meaning this test can be used) for the duration of the COVID-19 declaration under Section 564(b)(1) of the Act, 21 U.S.C.section 360bbb-3(b)(1), unless the authorization is terminated  or revoked sooner.       Influenza A by PCR NEGATIVE NEGATIVE Final   Influenza B by PCR NEGATIVE NEGATIVE Final    Comment: (NOTE) The Xpert Xpress SARS-CoV-2/FLU/RSV plus assay is intended as an aid in the diagnosis of influenza from Nasopharyngeal swab specimens and should not be used as a sole basis for treatment. Nasal washings and aspirates are unacceptable for Xpert Xpress SARS-CoV-2/FLU/RSV testing.  Fact Sheet for Patients: bloggercourse.com  Fact Sheet for Healthcare Providers: seriousbroker.it  This test is not yet approved or cleared by the [place]  FDA and has been authorized for detection and/or diagnosis of SARS-CoV-2 by FDA under an Emergency Use Authorization (EUA). This EUA will remain in effect (meaning this test can be used) for the duration of the COVID-19 declaration under Section 564(b)(1) of the Act, 21 U.S.C. section 360bbb-3(b)(1), unless the authorization is terminated or revoked.     Resp Syncytial Virus by PCR NEGATIVE NEGATIVE Final    Comment: (NOTE) Fact Sheet for Patients: bloggercourse.com  Fact Sheet for Healthcare  Providers: seriousbroker.it  This test is not yet approved or cleared by the [place]  FDA and has been authorized for detection and/or diagnosis of SARS-CoV-2 by FDA under an Emergency Use Authorization (EUA). This EUA will remain in effect (meaning this test can be used) for the duration of the COVID-19 declaration under Section 564(b)(1) of the Act, 21 U.S.C. section 360bbb-3(b)(1), unless the authorization is terminated or revoked.  Performed at [facility], [address] [place], [place] [address]   MRSA Next Gen by PCR, Nasal     Status: None   Collection Time: 05/03/23  3:43 PM   Specimen: Nasal Mucosa; Nasal Swab  Result Value Ref Range Status   MRSA by PCR Next Gen NOT DETECTED NOT DETECTED Final    Comment: (NOTE) The GeneXpert MRSA Assay (FDA approved for NASAL specimens only), is one component of a comprehensive MRSA colonization surveillance program. It is not intended to diagnose MRSA infection nor to guide or monitor treatment for MRSA infections. Test performance is not FDA approved in patients less than 81 years old. Performed at [facility], [address], [place], [place] [address]      Radiology Studies: No results found.   Scheduled Meds:  amoxicillin -clavulanate  1 tablet Oral Q12H   vitamin C   500 mg Oral BID   vitamin D3  2,000 Units Oral Daily   cyanocobalamin   1,000 mcg Oral Daily   feeding supplement  237 mL Oral BID BM   heparin   5,000 Units Subcutaneous Q8H   insulin  aspart  0-5 Units Subcutaneous QHS   insulin  aspart  0-9 Units Subcutaneous TID WC   insulin  glargine-yfgn  8 Units Subcutaneous QHS   multivitamin with minerals  1 tablet Oral Daily   pantoprazole   40 mg Oral Daily   sertraline   50 mg Oral QHS   thiamine   100 mg Oral  Daily   Continuous Infusions:     LOS: 5 days    Time spent:  55min  Kennedy Bohanon, DO [facility]  To contact the attending physician  between 7A-7P please use Epic Chat. To contact the covering physician during after hours 7P-7A, please review Amion.   05/04/2023, 5:50 PM   *This document has been created with the assistance of dictation software. Please excuse typographical errors. *

## 2023-05-04 NOTE — Plan of Care (Signed)
   Problem: Coping: Goal: Ability to adjust to condition or change in health will improve Outcome: Progressing   Problem: Fluid Volume: Goal: Ability to maintain a balanced intake and output will improve Outcome: Progressing

## 2023-05-04 NOTE — Plan of Care (Signed)
  Problem: Education: Goal: Ability to describe self-care measures that may prevent or decrease complications (Diabetes Survival Skills Education) will improve Outcome: Progressing Goal: Individualized Educational Video(s) Outcome: Progressing   Problem: Nutritional: Goal: Maintenance of adequate nutrition will improve Outcome: Progressing Goal: Progress toward achieving an optimal weight will improve Outcome: Progressing   Problem: Skin Integrity: Goal: Risk for impaired skin integrity will decrease Outcome: Progressing   Problem: Activity: Goal: Risk for activity intolerance will decrease Outcome: Progressing   Problem: Pain Management: Goal: General experience of comfort will improve Outcome: Progressing

## 2023-05-05 DIAGNOSIS — I96 Gangrene, not elsewhere classified: Secondary | ICD-10-CM | POA: Diagnosis not present

## 2023-05-05 LAB — CBC WITH DIFFERENTIAL/PLATELET
Abs Immature Granulocytes: 0.04 10*3/uL (ref 0.00–0.07)
Basophils Absolute: 0.1 10*3/uL (ref 0.0–0.1)
Basophils Relative: 1 %
Eosinophils Absolute: 0.7 10*3/uL — ABNORMAL HIGH (ref 0.0–0.5)
Eosinophils Relative: 9 %
HCT: 26.9 % — ABNORMAL LOW (ref 36.0–46.0)
Hemoglobin: 8.8 g/dL — ABNORMAL LOW (ref 12.0–15.0)
Immature Granulocytes: 1 %
Lymphocytes Relative: 26 %
Lymphs Abs: 2 10*3/uL (ref 0.7–4.0)
MCH: 30.7 pg (ref 26.0–34.0)
MCHC: 32.7 g/dL (ref 30.0–36.0)
MCV: 93.7 fL (ref 80.0–100.0)
Monocytes Absolute: 1 10*3/uL (ref 0.1–1.0)
Monocytes Relative: 13 %
Neutro Abs: 4 10*3/uL (ref 1.7–7.7)
Neutrophils Relative %: 50 %
Platelets: 431 10*3/uL — ABNORMAL HIGH (ref 150–400)
RBC: 2.87 MIL/uL — ABNORMAL LOW (ref 3.87–5.11)
RDW: 13.1 % (ref 11.5–15.5)
WBC: 7.7 10*3/uL (ref 4.0–10.5)
nRBC: 0 % (ref 0.0–0.2)

## 2023-05-05 LAB — COMPREHENSIVE METABOLIC PANEL
ALT: 15 U/L (ref 0–44)
AST: 26 U/L (ref 15–41)
Albumin: 3.2 g/dL — ABNORMAL LOW (ref 3.5–5.0)
Alkaline Phosphatase: 45 U/L (ref 38–126)
Anion gap: 8 (ref 5–15)
BUN: 36 mg/dL — ABNORMAL HIGH (ref 8–23)
CO2: 23 mmol/L (ref 22–32)
Calcium: 9.2 mg/dL (ref 8.9–10.3)
Chloride: 109 mmol/L (ref 98–111)
Creatinine, Ser: 1.61 mg/dL — ABNORMAL HIGH (ref 0.44–1.00)
GFR, Estimated: 35 mL/min — ABNORMAL LOW (ref >60)
Glucose, Bld: 109 mg/dL — ABNORMAL HIGH (ref 70–99)
Potassium: 4.5 mmol/L (ref 3.5–5.1)
Sodium: 140 mmol/L (ref 135–145)
Total Bilirubin: 0.6 mg/dL (ref 0.0–1.2)
Total Protein: 6.7 g/dL (ref 6.5–8.1)

## 2023-05-05 LAB — GLUCOSE, CAPILLARY
Glucose-Capillary: 106 mg/dL — ABNORMAL HIGH (ref 70–99)
Glucose-Capillary: 148 mg/dL — ABNORMAL HIGH (ref 70–99)
Glucose-Capillary: 229 mg/dL — ABNORMAL HIGH (ref 70–99)

## 2023-05-05 LAB — PHOSPHORUS: Phosphorus: 3 mg/dL (ref 2.5–4.6)

## 2023-05-05 LAB — MAGNESIUM: Magnesium: 1.8 mg/dL (ref 1.7–2.4)

## 2023-05-05 MED ORDER — ACETAMINOPHEN 500 MG PO TABS: 500.0000 mg | ORAL_TABLET | Freq: Two times a day (BID) | ORAL | 0 refills | Status: AC | PRN

## 2023-05-05 MED ORDER — AMOXICILLIN-POT CLAVULANATE 875-125 MG PO TABS: 1.0000 | ORAL_TABLET | Freq: Two times a day (BID) | ORAL | 0 refills | Status: AC

## 2023-05-05 NOTE — Care Management Important Message (Signed)
 Important Message  Patient Details  Name: Laurie Lin MRN: [id number] Date of Birth: 1955/05/04   Important Message Given:  Yes - Medicare IM     Cristela Blue, CMA 05/05/2023, 9:11 AM

## 2023-05-05 NOTE — Progress Notes (Signed)
 Attempted to call report to [facility]. Mary answered and stated nurse needed to speak to [facility] and she was in a meeting. This nurse spoke with Pam at bedside this morning. Ronal took this nurse's number and stated Pam would call back after the meeting if she needed further updates.

## 2023-05-05 NOTE — Hospital Course (Signed)
 Laurie Lin is 68 y.o. female with prior diagnosis of Warnicke Korsakoff encephalopathy, CKD stage IV, type 1 diabetes, recent admission for Mallory-Weiss tear and grade D esophagitis seen on EGD.  She is brought to the ED on this admission with abdominal pain and vomiting as well as concerns of bleeding from the right second toe. In the ED she was found to be tachycardic, otherwise normal vital signs.  WBC 11.9, negative respiratory viral panel, CT head and C-spine without acute changes.  Chest x-ray without active disease.  Hip x-ray without fracture.  Right foot x-ray negative for signs of osteomyelitis.  CT abdomen pelvis without acute changes.  She was started on broad-spectrum antibiotics for presumed cellulitis/wound infection and admitted to TRH service. Podiatry and vascular surgery were consulted.  Patient underwent angiogram on 1/10, she has small vessel disease but no acute revascularization was needed.  MRI was without any obvious osteomyelitis, possible osteonecrosis at early stages.  Patient did have elevated ESR and minimally elevated CRP.  She was continued on antibiotics.  She did not have significant changes to the foot on reevaluation.  She will need to follow-up closely with podiatry outpatient in 2 weeks.  She is also being sent home with a continued course of Augmentin . Stay was then delayed for an 3 days as she awaited return to her assisted living facility.  TOC was consulted early on to assist in this transition.  She is now returning to her assisted living facility with home health services.

## 2023-05-05 NOTE — Plan of Care (Signed)

## 2023-05-05 NOTE — TOC Progression Note (Signed)
 Transition of Care [facility]) - Progression Note    Patient Details  Name: Laurie Lin MRN: [id number] Date of Birth: 1955/05/25  Transition [facility]) CM/SW Contact  Royanne JINNY Bernheim, RN Phone Number: 05/05/2023, 1:28 PM  Clinical Narrative:    Florence Wilburt Larve and asked if he would transport, he stated that he wants [facility] to do it, I called [facility] care and she will call the transportation team to see if they can pick her up, put me on hold came back and said that their transportation driver is not there so they can not transport, I called Spouse terry back and he said he will call [facility] and them pick up the patient   Expected Discharge Plan: Assisted Living (with home health) Barriers to Discharge: Continued Medical Work up  Expected Discharge Plan and Services     Post Acute Care Choice:  (TBD) Living arrangements for the past 2 months: Assisted Living Facility Expected Discharge Date: 05/05/23                                     Social Determinants of Health (SDOH) Interventions SDOH Screenings   Food Insecurity: No Food Insecurity (04/30/2023)  Housing: Unknown (04/30/2023)  Transportation Needs: Patient Unable To Answer (04/30/2023)  Utilities: Patient Unable To Answer (04/30/2023)  Depression (PHQ2-9): Low Risk  (02/12/2023)  Recent Concern: Depression (PHQ2-9) - High Risk (01/09/2023)  Financial Resource Strain: Low Risk  (02/11/2023)  Physical Activity: Unknown (02/11/2023)  Social Connections: Patient Unable To Answer (04/30/2023)  Recent Concern: Social Connections - Moderately Isolated (02/11/2023)  Stress: Stress Concern Present (02/11/2023)  Tobacco Use: Low Risk  (04/29/2023)    Readmission Risk Interventions    04/30/2023   12:40 PM 05/23/2022   10:51 AM 05/07/2022    9:48 AM  Readmission Risk Prevention Plan  Transportation Screening Complete Complete Complete  PCP or Specialist Appt within 3-5 Days Complete Complete Complete   Social Work Consult for Recovery Care Planning/Counseling Complete Complete Complete  Palliative Care Screening Not Applicable Not Applicable Not Applicable  Medication Review Oceanographer) Complete Complete Complete

## 2023-05-05 NOTE — [place] FL2 (Addendum)
 [place]  MEDICAID FL2 LEVEL OF CARE FORM     IDENTIFICATION  Patient Name: Laurie Lin Birthdate: April 02, 1956 Sex: female Admission Date (Current Location): 04/29/2023  [place] and [place] Number:  Chiropodist and Address:  [facility], [address] [place], [place] [address]      Provider Number: [id number]  Attending Physician Name and Address:  Leesa Kast, DO  Relative Name and Phone Number:  Jaydn Fincher  Spouse  Emergency Contact  [phone]    Current Level of Care: Hospital Recommended Level of Care: Assisted Living Facility Prior Approval Number:    Date Approved/Denied:   PASRR Number:    Discharge Plan: Other (Comment) (ALF)    Current Diagnoses: Patient Active Problem List   Diagnosis Date Noted   Abdominal pain and vomiting 04/30/2023   Gangrene of toe of right foot (HCC) 04/29/2023   History of hematemesis 04/29/2023   Type 1 diabetes mellitus (HCC) 04/29/2023   History of hematemesis, grade D esophagitis on EGD 04/2022 04/29/2023   Chronic kidney disease, stage IV (severe) (HCC) 02/17/2023   Annual physical exam 02/16/2023   Hallucination 01/18/2023   Confusion 01/18/2023   Acute cystitis with hematuria 01/18/2023   UTI symptoms 10/12/2022   Cough 09/25/2022   Anxiety 09/08/2022   Lower extremity edema 07/13/2022   Cognitive impairment 07/13/2022   CKD (chronic kidney disease), stage IV (HCC) 07/13/2022   Hypomagnesemia 05/26/2022   Dysphagia 05/24/2022   Orthostatic hypotension 05/23/2022   Hematemesis 05/22/2022   Acute post-hemorrhagic anemia 05/22/2022   Acute kidney injury superimposed on CKD (HCC) 05/22/2022   Weakness 05/22/2022   Hypokalemia 05/22/2022   Aspiration pneumonia (HCC) 04/25/2022   Overweight (BMI 25.0-29.9) 04/19/2022   Encephalopathy acute 04/18/2022   Alcohol abuse 04/18/2022   GERD without esophagitis 04/18/2022   Wernicke-Korsakoff syndrome (HCC) 04/18/2022    Trochanteric bursitis of left hip 01/17/2020   Chronic disease anemia 11/03/2019   Chronic kidney disease, stage 3 unspecified (HCC) 11/03/2019   Burn of second degree of left foot, initial encounter 12/28/2018   Neurogenic pain 09/30/2017   Hyponatremia 09/30/2017   Hypocalcemia 09/30/2017   Hypoalbuminemia 09/30/2017   Diabetes mellitus with stage 4 chronic kidney disease GFR 15-29 (HCC) 09/30/2017   Diabetic peripheral neuropathy (HCC) 09/30/2017   Chronic pain of hip (B) 09/30/2017   Chronic sacroiliac joint pain (B) 09/30/2017   Lumbar facet joint syndrome 09/30/2017   H/O diabetic foot ulcer 09/09/2017   Chronic low back pain (Primary Area of Pain) (Bilateral) (R>L) w/ sciatica (Bilateral) 09/09/2017   Chronic lower extremity pain (Secondary Area of Pain) (Bilateral) (R>L) 09/09/2017   Chronic pain syndrome 09/09/2017   Long term current use of opiate analgesic 09/09/2017   Pharmacologic therapy 09/09/2017   Disorder of skeletal system 09/09/2017   Problems influencing health status 09/09/2017   Other cirrhosis of liver (HCC) 06/03/2017   Chronic lumbar radiculopathy 06/03/2017   Chronic neck pain 06/03/2017   Type 1 diabetes mellitus with hyperglycemia (HCC) 04/30/2017   Atrial flutter (HCC) 04/30/2017   Neuropathy 04/30/2017   Hiatal hernia 04/30/2017   Cataracts, bilateral 04/30/2017   Albuminuria 04/30/2017   DKA (diabetic ketoacidosis) (HCC) 03/21/2017   Reactive depression 01/03/2015   Essential hypertension 09/28/2014   Hepatic steatosis 08/17/2013   Abnormal levels of other serum enzymes 08/17/2013    Orientation RESPIRATION BLADDER Height & Weight     Self, Place  Normal Continent Weight: 76 kg Height:  5' 10 (177.8 cm)  BEHAVIORAL SYMPTOMS/MOOD NEUROLOGICAL BOWEL NUTRITION STATUS      Continent Diet (carb modified)  AMBULATORY STATUS COMMUNICATION OF NEEDS Skin   Limited Assist Verbally Normal, Skin abrasions (left foot)                        Personal Care Assistance Level of Assistance  Bathing, Feeding, Dressing Bathing Assistance: Limited assistance Feeding assistance: Limited assistance Dressing Assistance: Limited assistance     Functional Limitations Info  Sight, Hearing, Speech Sight Info: Adequate Hearing Info: Adequate Speech Info: Adequate    SPECIAL CARE FACTORS FREQUENCY  PT (By licensed PT), OT (By licensed OT)     PT Frequency: 3 times per week OT Frequency: 2 times per week            Contractures Contractures Info: Not present    Additional Factors Info  Code Status, Allergies Code Status Info: Full code Allergies Info: Ibuprofen, Nsaids, Brimonidine, Latex           Current Medications (05/05/2023):       Discharge Medications: TAKE these medications     acetaminophen  500 MG tablet Commonly known as: TYLENOL   1 tablet (500 mg total)  by mouth 2 times per day as needed for mild pain, fever or headache    ALPRAZolam 0.25 MG tablet Commonly known as: XANAX Take 0.25 mg by mouth 2 (two) times daily as needed for anxiety.    amoxicillin -clavulanate 875-125 MG tablet Commonly known as: AUGMENTIN  Take 1 tablet by mouth every 12 (twelve) hours for 5 days.    Basaglar  KwikPen 100 UNIT/ML Inject 8 Units into the skin at bedtime. What changed: how much to take    calcium  carbonate 750 MG chewable tablet Commonly known as: TUMS EX Chew 1 tablet by mouth as needed for heartburn. As needed    cyanocobalamin  1000 MCG tablet Place 1 tablet (1,000 mcg total) into feeding tube daily. What changed: how to take this    Dexcom G7 Sensor Misc 1 Application by Does not apply route every 14 (fourteen) days.    ferrous sulfate 325 (65 FE) MG EC tablet Take 325 mg by mouth daily with breakfast.    folic acid  1 MG tablet Commonly known as: FOLVITE  Place 1 tablet (1 mg total) into feeding tube daily. What changed: how to take this    insulin  lispro 100 UNIT/ML injection Commonly  known as: HUMALOG Inject 0-9 Units into the skin 3 (three) times daily before meals. Sliding scale per Endocrinology What changed: Another medication with the same name was changed. Make sure you understand how and when to take each.    HumaLOG KwikPen 100 UNIT/ML KwikPen Generic drug: insulin  lispro INJECT AS PER SLIDING SCALE IF 0-150=0 LESS THAN 60 NOTIFY NP/MD 151-200=2UNITS,201-250=3 UNITS,251-300=5 UNITS,301-350=7 UNITS,351-400=9 UNITS GREATER THAN 401 CALL NP/MD What changed: See the new instructions.    Insulin  Pen Needle 32G X 4 MM Misc ok to sub any brand or size needle preferred by insurance/patient, use up to 5x/day, dx E10.65    lisinopril  2.5 MG tablet Commonly known as: ZESTRIL  Take 2.5 mg by mouth daily.    multivitamin with minerals Tabs tablet Place 1 tablet into feeding tube daily.    omeprazole 20 MG capsule Commonly known as: PRILOSEC Take 20 mg by mouth daily.    OneTouch Delica Lancets 33G Misc 1 Device by Does not apply route 4 (four) times daily.    polyethylene glycol 17 g packet Commonly known as:  MIRALAX  / GLYCOLAX  Take 17 g by mouth daily.    Rhopressa  0.02 % Soln Generic drug: Netarsudil  Dimesylate Place 1 drop into both eyes every evening.    risperiDONE  1 MG tablet Commonly known as: RISPERDAL  Take 1 mg by mouth 2 (two) times daily.    sertraline  50 MG tablet Commonly known as: ZOLOFT  Take 50 mg by mouth daily.    sertraline  25 MG tablet Commonly known as: ZOLOFT  TAKE 1 TABLET (25 MG TOTAL) BY MOUTH DAILY.    thiamine  100 MG tablet Commonly known as: VITAMIN B1 Place 1 tablet (100 mg total) into feeding tube daily. What changed: how to take this    torsemide  20 MG tablet Commonly known as: DEMADEX  Take 20 mg by mouth daily. As needed    traMADol 50 MG tablet Commonly known as: ULTRAM Take 50 mg by mouth 2 (two) times daily.    Travoprost  (BAK Free) 0.004 % Soln ophthalmic solution Commonly known as: TRAVATAN  Place 1 drop  into both eyes at bedtime.    traZODone  50 MG tablet Commonly known as: DESYREL  Take 25 mg by mouth at bedtime.    vitamin D3 25 MCG tablet Commonly known as: CHOLECALCIFEROL  Place 2 tablets (2,000 Units total) into feeding tube daily. What changed: how to take this    vitamin E 180 MG (400 UNITS) capsule Take 400 Units by mouth daily.                          Please see discharge summary for a list of discharge medications.  Relevant Imaging Results:  Relevant Lab Results:   Additional Information SS#: [id number]. [facility] 1/22-1/31 when admitted.  Taegan Standage J Callum Wolf, RN

## 2023-05-05 NOTE — Discharge Summary (Signed)
 Physician Discharge Summary   Patient: Laurie Lin MRN: [id number] DOB: May 16, 1955  Admit date:     04/29/2023  Discharge date: 05/05/23  Discharge Physician: Lorane Poland   PCP: Vincente Saber, NP   Recommendations at discharge:    Follow up with podiatry in 2 weeks  Discharge Diagnoses: Principal Problem:   Gangrene of toe of right foot (HCC) Active Problems:   History of hematemesis   History of hematemesis, grade D esophagitis on EGD 04/2022   Abdominal pain and vomiting   Chronic disease anemia   Diabetic peripheral neuropathy (HCC)   Type 1 diabetes mellitus (HCC)   Chronic kidney disease, stage IV (severe) (HCC)   Wernicke-Korsakoff syndrome (HCC)  Resolved Problems:   * No resolved hospital problems. *  Hospital Course: Laurie Lin is 68 y.o. female with prior diagnosis of Warnicke Korsakoff encephalopathy, CKD stage IV, type 1 diabetes, recent admission for Mallory-Weiss tear and grade D esophagitis seen on EGD.  She is brought to the ED on this admission with abdominal pain and vomiting as well as concerns of bleeding from the right second toe. In the ED she was found to be tachycardic, otherwise normal vital signs.  WBC 11.9, negative respiratory viral panel, CT head and C-spine without acute changes.  Chest x-ray without active disease.  Hip x-ray without fracture.  Right foot x-ray negative for signs of osteomyelitis.  CT abdomen pelvis without acute changes.  She was started on broad-spectrum antibiotics for presumed cellulitis/wound infection and admitted to [facility] service. Podiatry and vascular surgery were consulted.  Patient underwent angiogram on 1/10, she has small vessel disease but no acute revascularization was needed.  MRI was without any obvious osteomyelitis, possible osteonecrosis at early stages.  Patient did have elevated ESR and minimally elevated CRP.  She was continued on antibiotics.  She did not have significant changes to the foot on  reevaluation.  She will need to follow-up closely with podiatry outpatient in 2 weeks.  She is also being sent home with a continued course of Augmentin . Stay was then delayed for an 3 days as she awaited return to her assisted living facility.  TOC was consulted early on to assist in this transition.  She is now returning to her assisted living facility with home health services.    Green right foot second toe Diabetic peripheral neuropathy - MRI foot obtained: No obvious osteomyelitis.  Possible osteonecrosis early stages. - Elevated ESR, minimally elevated CRP - ABIs ordered but unable to be calculated due to stenosis. - Weightbearing as tolerated - Continue Augmentin  - Podiatry consulted, Recommends close monitoring for now and outpatient follow-up.  No urgent need for amputation.  Questionable embolic event to the second toe arterial supply. - No special wound care, keep clean and dry.  Band-Aid and sock. --Vascular consulted.  Status post angiogram on 1/10.  No acute revascularization needed - Was previously seeing podiatry at [facility] in 2023   UTI Culture growing Citrobacter with resistance to Bactrim  and ciprofloxacin - s/p 3 days ceftriaxone  for now   Abdominal pain and vomiting, resolved - History of Mallory-Weiss tear 1 yr ago - Known grade D esophagitis - History of dysphagia requiring PEG tube -- CT abdomen pelvis nonacute - LFTs within normal limits. - Lipase unremarkable - Currently on IV Protonix  and antiemetics - Hemoglobin appears to be downtrending, repeat CBC stable. Continue to monitor   Acute on chronic anemia - Anemia of CKD, unclear baseline.  Hemoglobin downtrending now.  Possible  GI etiology given history - If hemoglobin continues to downtrend consider GI consultation   Type 1 diabetes, uncontrolled - Hemoglobin A1c 9.2% - Follows with endocrinology outpatient - Continue with basal bolus insulin  for now, titrate up as tolerated   CKD stage IV - Renal  function appears to be at her baseline - Avoid nephrotoxic meds when possible.  Renally dose when needed   Warnicke Korsakoff syndrome - Continue frequent reorientation, delirium precautions -- Resides at [facility] memory care -- Atarax  as needed for anxiety       Consultants: Podiatry, Vascular Surgery Procedures performed: Angiogram  Disposition: Assisted living Diet recommendation:  Discharge Diet Orders (From admission, onward)     Start     Ordered   05/05/23 0000  Diet Carb Modified        05/05/23 1305           Carb modified diet DISCHARGE MEDICATION: Allergies as of 05/05/2023       Reactions   Ibuprofen Nausea Only   Nsaids    Ulcerative stomach and small intestines   Brimonidine Itching   Red, itchy, sensitivity to light Red, itchy, sensitivity to light   Latex Itching   Other reaction(s): Other (see comments)        Medication List     TAKE these medications    acetaminophen  500 MG tablet Commonly known as: TYLENOL  Place 1 tablet (500 mg total) into feeding tube 2 (two) times daily as needed for mild pain, fever or headache. What changed: how to take this   ALPRAZolam 0.25 MG tablet Commonly known as: XANAX Take 0.25 mg by mouth 2 (two) times daily as needed for anxiety.   amoxicillin -clavulanate 875-125 MG tablet Commonly known as: AUGMENTIN  Take 1 tablet by mouth every 12 (twelve) hours for 5 days.   Basaglar  KwikPen 100 UNIT/ML Inject 8 Units into the skin at bedtime. What changed: how much to take   calcium  carbonate 750 MG chewable tablet Commonly known as: TUMS EX Chew 1 tablet by mouth as needed for heartburn. As needed   cyanocobalamin  1000 MCG tablet Place 1 tablet (1,000 mcg total) into feeding tube daily. What changed: how to take this   Dexcom G7 Sensor Misc 1 Application by Does not apply route every 14 (fourteen) days.   ferrous sulfate 325 (65 FE) MG EC tablet Take 325 mg by mouth daily with breakfast.   folic  acid 1 MG tablet Commonly known as: FOLVITE  Place 1 tablet (1 mg total) into feeding tube daily. What changed: how to take this   insulin  lispro 100 UNIT/ML injection Commonly known as: HUMALOG Inject 0-9 Units into the skin 3 (three) times daily before meals. Sliding scale per Endocrinology What changed: Another medication with the same name was changed. Make sure you understand how and when to take each.   HumaLOG KwikPen 100 UNIT/ML KwikPen Generic drug: insulin  lispro INJECT AS PER SLIDING SCALE IF 0-150=0 LESS THAN 60 NOTIFY NP/MD 151-200=2UNITS,201-250=3 UNITS,251-300=5 UNITS,301-350=7 UNITS,351-400=9 UNITS GREATER THAN 401 CALL NP/MD What changed: See the new instructions.   Insulin  Pen Needle 32G X 4 MM Misc ok to sub any brand or size needle preferred by insurance/patient, use up to 5x/day, dx E10.65   lisinopril  2.5 MG tablet Commonly known as: ZESTRIL  Take 2.5 mg by mouth daily.   multivitamin with minerals Tabs tablet Place 1 tablet into feeding tube daily.   omeprazole 20 MG capsule Commonly known as: PRILOSEC Take 20 mg by mouth daily.  OneTouch Delica Lancets 33G Misc 1 Device by Does not apply route 4 (four) times daily.   polyethylene glycol 17 g packet Commonly known as: MIRALAX  / GLYCOLAX  Take 17 g by mouth daily.   Rhopressa  0.02 % Soln Generic drug: Netarsudil  Dimesylate Place 1 drop into both eyes every evening.   risperiDONE  1 MG tablet Commonly known as: RISPERDAL  Take 1 mg by mouth 2 (two) times daily.   sertraline  50 MG tablet Commonly known as: ZOLOFT  Take 50 mg by mouth daily.   sertraline  25 MG tablet Commonly known as: ZOLOFT  TAKE 1 TABLET (25 MG TOTAL) BY MOUTH DAILY.   thiamine  100 MG tablet Commonly known as: VITAMIN B1 Place 1 tablet (100 mg total) into feeding tube daily. What changed: how to take this   torsemide  20 MG tablet Commonly known as: DEMADEX  Take 20 mg by mouth daily. As needed   traMADol 50 MG  tablet Commonly known as: ULTRAM Take 50 mg by mouth 2 (two) times daily.   Travoprost  (BAK Free) 0.004 % Soln ophthalmic solution Commonly known as: TRAVATAN  Place 1 drop into both eyes at bedtime.   traZODone  50 MG tablet Commonly known as: DESYREL  Take 25 mg by mouth at bedtime.   vitamin D3 25 MCG tablet Commonly known as: CHOLECALCIFEROL  Place 2 tablets (2,000 Units total) into feeding tube daily. What changed: how to take this   vitamin E 180 MG (400 UNITS) capsule Take 400 Units by mouth daily.               Discharge Care Instructions  (From admission, onward)           Start     Ordered   05/05/23 0000  Discharge wound care:       Comments: Keep right foot clean and dry, bandaid and sock.   05/05/23 1305            Discharge Exam: Filed Weights   05/01/23 1556 05/04/23 0336 05/05/23 0500  Weight: 75.9 kg 78.7 kg 76 kg   General exam: Appears calm and comfortable, NAD  Respiratory system: No work of breathing, symmetric chest wall expansion Cardiovascular system: S1 & S2 heard, RRR.  Gastrointestinal system: Abdomen is nondistended, soft and nontender.  Neuro: Alert and confused, requires reorientation. Skin: Gangrenous changes to the second toe of right foot, thickened skin, flaking at distal endpoints, thick toenails, black discoloration of distal aspect of toe. no significant surrounding erythema.  No active oozing or bleeding. Psychiatry: Mood & affect appropriate for situation.   Condition at discharge: stable  The results of significant diagnostics from this hospitalization (including imaging, microbiology, ancillary and laboratory) are listed below for reference.   Imaging Studies: PERIPHERAL VASCULAR CATHETERIZATION Result Date: 05/01/2023 See surgical note for result.  US  ARTERIAL ABI (SCREENING LOWER EXTREMITY) Result Date: 04/30/2023 CLINICAL DATA:  Peripheral gangrene EXAM: NONINVASIVE PHYSIOLOGIC VASCULAR STUDY OF BILATERAL LOWER  EXTREMITIES TECHNIQUE: Evaluation of both lower extremities were performed at rest, including calculation of ankle-brachial indices with single level Doppler, pressure and pulse volume recording. COMPARISON:  None Available. FINDINGS: Right ABI:  Not able to be calculated Left ABI:  Not able to be calculated Right Lower Extremity:  Monophasic waveforms are noted. Left Lower Extremity:  Monophasic waveforms are noted. > 1.4 Non diagnostic secondary to incompressible vessel calcifications (medial arterial sclerosis of [place]) IMPRESSION: Ankle brachial indices could not be calculated due to noncompressible vessels. Monophasic waveforms are noted distally consistent with peripheral vascular disease. Electronically Signed  By: Oneil Devonshire M.D.   On: 04/30/2023 18:55   MR FOOT RIGHT WO CONTRAST Result Date: 04/30/2023 CLINICAL DATA:  Osteonecrosis suspected, foot, xray done Black discoloration of the 2nd toe for 1 week.  History of diabetes. EXAM: MRI OF THE RIGHT FOREFOOT WITHOUT CONTRAST TECHNIQUE: Multiplanar, multisequence MR imaging of the right forefoot was performed. No intravenous contrast was administered. COMPARISON:  Radiographs 04/29/2023 FINDINGS: Technical note: Despite efforts by the technologist and patient, mild to moderate motion artifact is present on today's exam and could not be eliminated. This reduces exam sensitivity and specificity. In addition, fat saturation on the T2 weighted images is suboptimal. Bones/Joint/Cartilage Sagittal inversion recovery images demonstrate mild marrow hyperintensity within the distal 1st and 2nd phalanges. No cortical destruction or T1 marrow abnormalities are seen in these areas. Mild degenerative changes at the 1st metacarpal phalangeal joint with mild T2 hyperintensity and T1 hypointensity within the tibial sesamoid, likely degenerative. No significant joint effusions. The alignment is normal at the Lisfranc joint. Ligaments Intact Lisfranc ligament. Intact  collateral ligaments of the metatarsophalangeal joints. Muscles and Tendons Generalized muscular atrophy and T2 hyperintensity, likely related to underlying diabetes. No focal intramuscular fluid collection, tendon tear or significant tenosynovitis. Soft tissues Mild generalized forefoot soft tissue edema without evidence of focal fluid collection, foreign body or focal skin ulceration. IMPRESSION: 1. Mild marrow hyperintensity within the distal 1st and 2nd phalanges, nonspecific in the absence of focal skin ulceration and possibly reactive or secondary to early osteonecrosis. No cortical destruction or T1 marrow abnormalities to suggest osteomyelitis. Correlate clinically. 2. Mild generalized forefoot soft tissue edema without evidence of focal fluid collection, foreign body or focal skin ulceration. 3. Degenerative changes at the 1st metatarsophalangeal joint and it is tibial sesamoid. 4. Generalized muscular atrophy and T2 hyperintensity, likely related to underlying diabetes. Electronically Signed   By: Elsie Perone M.D.   On: 04/30/2023 12:23   CT ABDOMEN PELVIS WO CONTRAST Result Date: 04/29/2023 CLINICAL DATA:  Fall emesis EXAM: CT ABDOMEN AND PELVIS WITHOUT CONTRAST TECHNIQUE: Multidetector CT imaging of the abdomen and pelvis was performed following the standard protocol without IV contrast. RADIATION DOSE REDUCTION: This exam was performed according to the departmental dose-optimization program which includes automated exposure control, adjustment of the mA and/or kV according to patient size and/or use of iterative reconstruction technique. COMPARISON:  CT 05/06/2022 FINDINGS: Lower chest: No acute airspace disease or pleural effusion. Calcified granuloma at the right lung base. Mild cardiomegaly Hepatobiliary: No focal liver abnormality is seen. No gallstones, gallbladder wall thickening, or biliary dilatation. Pancreas: Unremarkable. No pancreatic ductal dilatation or surrounding inflammatory  changes. Spleen: Normal in size without focal abnormality. Adrenals/Urinary Tract: Adrenal glands are normal. Kidneys show no hydronephrosis. Extensive intrarenal vascular calcifications. The bladder is unremarkable Stomach/Bowel: Stomach is within normal limits. No evidence of bowel wall thickening, distention, or inflammatory changes. Vascular/Lymphatic: Moderate aortic atherosclerosis. No aneurysm. No suspicious lymph nodes. Reproductive: Uterus and bilateral adnexa are unremarkable. Other: Negative for pelvic effusion or free air. Musculoskeletal: No acute or osseous abnormality. IMPRESSION: 1. No CT evidence for acute intra-abdominal or pelvic abnormality. 2. Aortic atherosclerosis. Aortic Atherosclerosis (ICD10-I70.0). Electronically Signed   By: Luke Bun M.D.   On: 04/29/2023 23:13   DG Foot Complete Right Result Date: 04/29/2023 CLINICAL DATA:  Gangrenous changes of second toe. Evaluation for osteomyelitis fracture. EXAM: RIGHT FOOT COMPLETE - 3+ VIEW COMPARISON:  None Available. FINDINGS: No acute fracture or dislocation. No evidence of osteomyelitis. Soft tissue swelling about the  second toe. Vascular calcifications. IMPRESSION: No acute fracture or evidence of osteomyelitis. Electronically Signed   By: Norman Gatlin M.D.   On: 04/29/2023 22:57   DG Pelvis 1-2 Views Result Date: 04/29/2023 CLINICAL DATA:  Frequent falls EXAM: PELVIS - 1-2 VIEW COMPARISON:  CT abdomen and pelvis 05/06/2022 FINDINGS: No acute fracture or dislocation. Degenerative changes pubic symphysis, both hips, SI joints and lower lumbar spine. IMPRESSION: No acute fracture or dislocation. Electronically Signed   By: Norman Gatlin M.D.   On: 04/29/2023 20:59   DG Chest Portable 1 View Result Date: 04/29/2023 CLINICAL DATA:  Frequent falls. EXAM: PORTABLE CHEST 1 VIEW COMPARISON:  04/25/2022 FINDINGS: Stable cardiomediastinal silhouette. No focal consolidation, pleural effusion, or pneumothorax. No displaced rib fractures.  IMPRESSION: No active disease. Electronically Signed   By: Norman Gatlin M.D.   On: 04/29/2023 20:58   CT Cervical Spine Wo Contrast Result Date: 04/29/2023 CLINICAL DATA:  Neck trauma.  Multiple falls. EXAM: CT CERVICAL SPINE WITHOUT CONTRAST TECHNIQUE: Multidetector CT imaging of the cervical spine was performed without intravenous contrast. Multiplanar CT image reconstructions were also generated. RADIATION DOSE REDUCTION: This exam was performed according to the departmental dose-optimization program which includes automated exposure control, adjustment of the mA and/or kV according to patient size and/or use of iterative reconstruction technique. COMPARISON:  CT of the cervical spine 06/09/2022 FINDINGS: Alignment: Slight degenerative anterolisthesis at C4-5 is stable. No other significant listhesis is present. Chronic straightening of the normal cervical lordosis is unchanged. Skull base and vertebrae: The craniocervical junction is within normal limits. The vertebral body heights are normal. No acute fractures are present. Soft tissues and spinal canal: No prevertebral fluid or swelling. No visible canal hematoma. Disc levels: Uncovertebral and facet hypertrophy lead to mild foraminal narrowing bilaterally at C5-6. No other significant stenosis is present. Upper chest: The lung apices are clear. The thoracic inlet is within normal limits. IMPRESSION: 1. No acute fracture or traumatic subluxation. 2. Mild foraminal narrowing bilaterally at C5-6. Electronically Signed   By: Lonni Necessary M.D.   On: 04/29/2023 19:01   CT HEAD WO CONTRAST ( ) Result Date: 04/29/2023 CLINICAL DATA:  Head trauma.  Fall. EXAM: CT HEAD WITHOUT CONTRAST TECHNIQUE: Contiguous axial images were obtained from the base of the skull through the vertex without intravenous contrast. RADIATION DOSE REDUCTION: This exam was performed according to the departmental dose-optimization program which includes automated exposure  control, adjustment of the mA and/or kV according to patient size and/or use of iterative reconstruction technique. COMPARISON:  CT head without contrast 09/10/2022. FINDINGS: Brain: Moderate generalized atrophy and white matter disease is present bilaterally. No acute infarct, hemorrhage, or mass lesion is present. Ventricular enlargement is stable. Chronic disc proportionate white matter volume loss in the parietal lobes is evident bilaterally. The brainstem and cerebellum are within normal limits. Midline structures are within normal limits. Vascular: Atherosclerotic calcifications are present within the cavernous internal carotid arteries bilaterally. No hyperdense vessel is present. Skull: Calvarium is intact. No focal lytic or blastic lesions are present. No significant extracranial soft tissue lesion is present. Sinuses/Orbits: The paranasal sinuses and mastoid air cells are clear. Bilateral lens replacements are noted. Globes and orbits are otherwise unremarkable. IMPRESSION: 1. No acute intracranial abnormality or significant interval change. 2. Moderate generalized atrophy and white matter disease likely reflects the sequela of chronic microvascular ischemia. 3. Chronic disproportionate white matter volume loss in the parietal lobes bilaterally. Electronically Signed   By: Lonni Necessary M.D.   On: 04/29/2023  18:59    Microbiology: Results for orders placed or performed during the hospital encounter of 04/29/23  Urine Culture     Status: Abnormal   Collection Time: 04/29/23  8:06 PM   Specimen: Urine, Random  Result Value Ref Range Status   Specimen Description   Final    URINE, RANDOM Performed at [facility], [address] [place], [place] [address]    Special Requests   Final    NONE Performed at [facility], [address] [place], [place] [address]    Culture 80,000 COLONIES/mL CITROBACTER FREUNDII (A)  Final   Report Status 05/02/2023 FINAL  Final    Organism ID, Bacteria CITROBACTER FREUNDII (A)  Final      Susceptibility   Citrobacter freundii - MIC*    CEFEPIME  <=0.12 SENSITIVE Sensitive     CEFTRIAXONE  <=0.25 SENSITIVE Sensitive     CIPROFLOXACIN >=4 RESISTANT Resistant     GENTAMICIN <=1 SENSITIVE Sensitive     IMIPENEM 1 SENSITIVE Sensitive     NITROFURANTOIN  <=16 SENSITIVE Sensitive     TRIMETH /SULFA  >=320 RESISTANT Resistant     PIP/TAZO 8 SENSITIVE Sensitive ug/mL    * 80,000 COLONIES/mL CITROBACTER FREUNDII  Resp panel by RT-PCR (RSV, Flu A&B, Covid) Anterior Nasal Swab     Status: None   Collection Time: 04/29/23  8:47 PM   Specimen: Anterior Nasal Swab  Result Value Ref Range Status   SARS Coronavirus 2 by RT PCR NEGATIVE NEGATIVE Final    Comment: (NOTE) SARS-CoV-2 target nucleic acids are NOT DETECTED.  The SARS-CoV-2 RNA is generally detectable in upper respiratory specimens during the acute phase of infection. The lowest concentration of SARS-CoV-2 viral copies this assay can detect is 138 copies/mL. A negative result does not preclude SARS-Cov-2 infection and should not be used as the sole basis for treatment or other patient management decisions. A negative result may occur with  improper specimen collection/handling, submission of specimen other than nasopharyngeal swab, presence of viral mutation(s) within the areas targeted by this assay, and inadequate number of viral copies(<138 copies/mL). A negative result must be combined with clinical observations, patient history, and epidemiological information. The expected result is Negative.  Fact Sheet for Patients:  bloggercourse.com  Fact Sheet for Healthcare Providers:  seriousbroker.it  This test is no t yet approved or cleared by the [place]  FDA and  has been authorized for detection and/or diagnosis of SARS-CoV-2 by FDA under an Emergency Use Authorization (EUA). This EUA will remain  in effect  (meaning this test can be used) for the duration of the COVID-19 declaration under Section 564(b)(1) of the Act, 21 U.S.C.section 360bbb-3(b)(1), unless the authorization is terminated  or revoked sooner.       Influenza A by PCR NEGATIVE NEGATIVE Final   Influenza B by PCR NEGATIVE NEGATIVE Final    Comment: (NOTE) The Xpert Xpress SARS-CoV-2/FLU/RSV plus assay is intended as an aid in the diagnosis of influenza from Nasopharyngeal swab specimens and should not be used as a sole basis for treatment. Nasal washings and aspirates are unacceptable for Xpert Xpress SARS-CoV-2/FLU/RSV testing.  Fact Sheet for Patients: bloggercourse.com  Fact Sheet for Healthcare Providers: seriousbroker.it  This test is not yet approved or cleared by the [place]  FDA and has been authorized for detection and/or diagnosis of SARS-CoV-2 by FDA under an Emergency Use Authorization (EUA). This EUA will remain in effect (meaning this test can be used) for the duration of the COVID-19 declaration under Section  564(b)(1) of the Act, 21 U.S.C. section 360bbb-3(b)(1), unless the authorization is terminated or revoked.     Resp Syncytial Virus by PCR NEGATIVE NEGATIVE Final    Comment: (NOTE) Fact Sheet for Patients: bloggercourse.com  Fact Sheet for Healthcare Providers: seriousbroker.it  This test is not yet approved or cleared by the [place]  FDA and has been authorized for detection and/or diagnosis of SARS-CoV-2 by FDA under an Emergency Use Authorization (EUA). This EUA will remain in effect (meaning this test can be used) for the duration of the COVID-19 declaration under Section 564(b)(1) of the Act, 21 U.S.C. section 360bbb-3(b)(1), unless the authorization is terminated or revoked.  Performed at [facility], [address] [place], [place] [address]   MRSA Next Gen by  PCR, Nasal     Status: None   Collection Time: 05/03/23  3:43 PM   Specimen: Nasal Mucosa; Nasal Swab  Result Value Ref Range Status   MRSA by PCR Next Gen NOT DETECTED NOT DETECTED Final    Comment: (NOTE) The GeneXpert MRSA Assay (FDA approved for NASAL specimens only), is one component of a comprehensive MRSA colonization surveillance program. It is not intended to diagnose MRSA infection nor to guide or monitor treatment for MRSA infections. Test performance is not FDA approved in patients less than 71 years old. Performed at [facility], [address] [place], [place] [address]     Labs: CBC: Recent Labs  Lab 05/01/23 [phone] 05/02/23 0113 05/03/23 0439 05/04/23 0442 05/05/23 0606  WBC 8.6 8.4 6.9 6.4 7.7  NEUTROABS 4.5 4.3 3.1 3.1 4.0  HGB 8.3* 7.5* 7.6* 8.0* 8.8*  HCT 25.4* 23.8* 23.5* 24.9* 26.9*  MCV 92.4 96.7 94.0 92.6 93.7  PLT 380 366 363 417* 431*   Basic Metabolic Panel: Recent Labs  Lab 05/01/23 0838 05/02/23 0113 05/03/23 0439 05/04/23 0442 05/05/23 0606  NA 139 137 137 137 140  K 4.2 4.2 4.5 4.4 4.5  CL 109 109 105 106 109  CO2 20* 21* 22 22 23   GLUCOSE 105* 172* 118* 74 109*  BUN 43* 40* 40* 37* 36*  CREATININE 1.64* 1.67* 1.64* 1.69* 1.61*  CALCIUM  8.7* 8.4* 8.7* 8.7* 9.2  MG  --  1.6* 1.7 1.7 1.8  PHOS  --  3.0 2.9 2.8 3.0   Liver Function Tests: Recent Labs  Lab 05/01/23 0838 05/02/23 0113 05/03/23 0439 05/04/23 0442 05/05/23 0606  AST 18 14* 19 18 26   ALT 11 10 12 10 15   ALKPHOS 41 43 42 43 45  BILITOT 0.3 0.4 0.6 0.7 0.6  PROT 6.4* 6.3* 6.3* 6.3* 6.7  ALBUMIN 3.1* 2.9* 3.1* 3.1* 3.2*   CBG: Recent Labs  Lab 05/04/23 1156 05/04/23 1655 05/04/23 2111 05/05/23 0814 05/05/23 1134  GLUCAP 184* 94 180* 106* 148*    Discharge time spent: greater than 30 minutes.  Signed: Karyss Frese, DO [facility] 05/05/2023

## 2023-05-05 NOTE — TOC Progression Note (Signed)
 Transition of Care [facility]) - Progression Note    Patient Details  Name: Laurie Lin MRN: [id number] Date of Birth: 02/06/1956  [facility]) CM/SW Contact  Royanne JINNY Bernheim, RN Phone Number: 05/05/2023, 2:24 PM  Clinical Narrative:    Spoke with Spouse terry he wants EMS to transport due to her altered mental status, Faxed [facility] an updated FL2, Called EMS to transport there are 4 ahead of her   Expected Discharge Plan: Assisted Living (with home health) Barriers to Discharge: Continued Medical Work up  Expected Discharge Plan and Services     Post Acute Care Choice:  (TBD) Living arrangements for the past 2 months: Assisted Living Facility Expected Discharge Date: 05/05/23                                     Social Determinants of Health (SDOH) Interventions SDOH Screenings   Food Insecurity: No Food Insecurity (04/30/2023)  Housing: Unknown (04/30/2023)  Transportation Needs: Patient Unable To Answer (04/30/2023)  Utilities: Patient Unable To Answer (04/30/2023)  Depression (PHQ2-9): Low Risk  (02/12/2023)  Recent Concern: Depression (PHQ2-9) - High Risk (01/09/2023)  Financial Resource Strain: Low Risk  (02/11/2023)  Physical Activity: Unknown (02/11/2023)  Social Connections: Patient Unable To Answer (04/30/2023)  Recent Concern: Social Connections - Moderately Isolated (02/11/2023)  Stress: Stress Concern Present (02/11/2023)  Tobacco Use: Low Risk  (04/29/2023)    Readmission Risk Interventions    04/30/2023   12:40 PM 05/23/2022   10:51 AM 05/07/2022    9:48 AM  Readmission Risk Prevention Plan  Transportation Screening Complete Complete Complete  PCP or Specialist Appt within 3-5 Days Complete Complete Complete  Social Work Consult for Recovery Care Planning/Counseling Complete Complete Complete  Palliative Care Screening Not Applicable Not Applicable Not Applicable  Medication Review Oceanographer) Complete Complete Complete

## 2023-05-05 NOTE — TOC Progression Note (Addendum)
 Transition of Care [facility]) - Progression Note    Patient Details  Name: Laurie Lin MRN: [id number] Date of Birth: 1956/04/08  Transition of [facility]) CM/SW Contact  Royanne JINNY Bernheim, RN Phone Number: 05/05/2023, 12:23 PM  Clinical Narrative:    Florence Lango at [facility] to inquire if she got the fax of information and sent and to inquire if they are able to accept the patient back they stated that they can take her back she will need HH set up and they prefer [facility], I called [facility] and spoke with Lauraine Left a message for a call back  I reached out to [facility] and asked if they can accept the patient They will accept her for [facility]  Expected Discharge Plan: Assisted Living (with home health) Barriers to Discharge: Continued Medical Work up  Expected Discharge Plan and Services     Post Acute Care Choice:  (TBD) Living arrangements for the past 2 months: Assisted Living Facility                                       Social Determinants of Health (SDOH) Interventions SDOH Screenings   Food Insecurity: No Food Insecurity (04/30/2023)  Housing: Unknown (04/30/2023)  Transportation Needs: Patient Unable To Answer (04/30/2023)  Utilities: Patient Unable To Answer (04/30/2023)  Depression (PHQ2-9): Low Risk  (02/12/2023)  Recent Concern: Depression (PHQ2-9) - High Risk (01/09/2023)  Financial Resource Strain: Low Risk  (02/11/2023)  Physical Activity: Unknown (02/11/2023)  Social Connections: Patient Unable To Answer (04/30/2023)  Recent Concern: Social Connections - Moderately Isolated (02/11/2023)  Stress: Stress Concern Present (02/11/2023)  Tobacco Use: Low Risk  (04/29/2023)    Readmission Risk Interventions    04/30/2023   12:40 PM 05/23/2022   10:51 AM 05/07/2022    9:48 AM  Readmission Risk Prevention Plan  Transportation Screening Complete Complete Complete  PCP or Specialist Appt within 3-5 Days Complete Complete Complete  Social Work Consult for  Recovery Care Planning/Counseling Complete Complete Complete  Palliative Care Screening Not Applicable Not Applicable Not Applicable  Medication Review Oceanographer) Complete Complete Complete

## 2023-05-13 ENCOUNTER — Emergency Department
Admission: EM | Admit: 2023-05-13 | Discharge: 2023-05-13 | Disposition: A | Discharge status: Home / Self Care | Payer: Medicare Other | Attending: Emergency Medicine | Admitting: Emergency Medicine

## 2023-05-13 ENCOUNTER — Other Ambulatory Visit: Payer: Self-pay

## 2023-05-13 DIAGNOSIS — F0390 Unspecified dementia without behavioral disturbance: Secondary | ICD-10-CM | POA: Insufficient documentation

## 2023-05-13 DIAGNOSIS — N179 Acute kidney failure, unspecified: Secondary | ICD-10-CM | POA: Diagnosis not present

## 2023-05-13 DIAGNOSIS — E875 Hyperkalemia: Secondary | ICD-10-CM | POA: Diagnosis not present

## 2023-05-13 DIAGNOSIS — R799 Abnormal finding of blood chemistry, unspecified: Secondary | ICD-10-CM | POA: Diagnosis present

## 2023-05-13 LAB — CBC WITH DIFFERENTIAL/PLATELET
Abs Immature Granulocytes: 0.03 10*3/uL (ref 0.00–0.07)
Basophils Absolute: 0.1 10*3/uL (ref 0.0–0.1)
Basophils Relative: 1 %
Eosinophils Absolute: 0.4 10*3/uL (ref 0.0–0.5)
Eosinophils Relative: 5 %
HCT: 27.2 % — ABNORMAL LOW (ref 36.0–46.0)
Hemoglobin: 8.9 g/dL — ABNORMAL LOW (ref 12.0–15.0)
Immature Granulocytes: 0 %
Lymphocytes Relative: 24 %
Lymphs Abs: 1.8 10*3/uL (ref 0.7–4.0)
MCH: 31.3 pg (ref 26.0–34.0)
MCHC: 32.7 g/dL (ref 30.0–36.0)
MCV: 95.8 fL (ref 80.0–100.0)
Monocytes Absolute: 0.7 10*3/uL (ref 0.1–1.0)
Monocytes Relative: 10 %
Neutro Abs: 4.5 10*3/uL (ref 1.7–7.7)
Neutrophils Relative %: 60 %
Platelets: 539 10*3/uL — ABNORMAL HIGH (ref 150–400)
RBC: 2.84 MIL/uL — ABNORMAL LOW (ref 3.87–5.11)
RDW: 13.7 % (ref 11.5–15.5)
WBC: 7.4 10*3/uL (ref 4.0–10.5)
nRBC: 0 % (ref 0.0–0.2)

## 2023-05-13 LAB — COMPREHENSIVE METABOLIC PANEL
ALT: 15 U/L (ref 0–44)
AST: 24 U/L (ref 15–41)
Albumin: 3.8 g/dL (ref 3.5–5.0)
Alkaline Phosphatase: 43 U/L (ref 38–126)
Anion gap: 8 (ref 5–15)
BUN: 51 mg/dL — ABNORMAL HIGH (ref 8–23)
CO2: 23 mmol/L (ref 22–32)
Calcium: 9.1 mg/dL (ref 8.9–10.3)
Chloride: 103 mmol/L (ref 98–111)
Creatinine, Ser: 2.19 mg/dL — ABNORMAL HIGH (ref 0.44–1.00)
GFR, Estimated: 24 mL/min — ABNORMAL LOW (ref >60)
Glucose, Bld: 149 mg/dL — ABNORMAL HIGH (ref 70–99)
Potassium: 5.6 mmol/L — ABNORMAL HIGH (ref 3.5–5.1)
Sodium: 134 mmol/L — ABNORMAL LOW (ref 135–145)
Total Bilirubin: 0.4 mg/dL (ref 0.0–1.2)
Total Protein: 7.6 g/dL (ref 6.5–8.1)

## 2023-05-13 LAB — URINALYSIS, ROUTINE W REFLEX MICROSCOPIC
Bilirubin Urine: NEGATIVE
Glucose, UA: NEGATIVE mg/dL
Hgb urine dipstick: NEGATIVE
Ketones, ur: NEGATIVE mg/dL
Leukocytes,Ua: NEGATIVE
Nitrite: NEGATIVE
Protein, ur: NEGATIVE mg/dL
Specific Gravity, Urine: 1.012 (ref 1.005–1.030)
pH: 5 (ref 5.0–8.0)

## 2023-05-13 LAB — BASIC METABOLIC PANEL
Anion gap: 14 (ref 5–15)
BUN: 50 mg/dL — ABNORMAL HIGH (ref 8–23)
CO2: 22 mmol/L (ref 22–32)
Calcium: 9.2 mg/dL (ref 8.9–10.3)
Chloride: 102 mmol/L (ref 98–111)
Creatinine, Ser: 1.89 mg/dL — ABNORMAL HIGH (ref 0.44–1.00)
GFR, Estimated: 29 mL/min — ABNORMAL LOW (ref >60)
Glucose, Bld: 144 mg/dL — ABNORMAL HIGH (ref 70–99)
Potassium: 4.9 mmol/L (ref 3.5–5.1)
Sodium: 138 mmol/L (ref 135–145)

## 2023-05-13 MED ORDER — LACTATED RINGERS IV BOLUS
1000.0000 mL | Freq: Once | INTRAVENOUS | Status: AC
  Administered 2023-05-13: 1000 mL via INTRAVENOUS

## 2023-05-13 MED ORDER — SODIUM ZIRCONIUM CYCLOSILICATE 10 G PO PACK
10.0000 g | PACK | Freq: Once | ORAL | Status: AC
  Administered 2023-05-13: 10 g via ORAL
  Filled 2023-05-13: qty 1

## 2023-05-13 NOTE — ED Notes (Signed)
Lab drawing blood at this time. 

## 2023-05-13 NOTE — ED Triage Notes (Signed)
From [facility] memory care, sent to ED via [facility] for eval of elevated cr, bun, and K.  Patient is without complaint.  To baseline per staff.

## 2023-05-13 NOTE — ED Provider Notes (Signed)
[facility] Provider Note    None    (approximate)   History   Abnormal Lab   HPI  Laurie Lin is a 68 y.o. female who presents to the ED for evaluation of Abnormal Lab   Review of medical DC summary from 1 week ago seen for a gangrenous toe on the right foot in the setting of PAD.  No osteo.  1/10 angiogram with vascular but no revascularization required.  UTI, Citrobacter, cut ceftriaxone based off of cultures.  CKD 4.  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  Patient presents due to routine blood work demonstrating AKI and hyperkalemia.  Patient asymptomatic.  History limited due to baseline dementia.  Her husband and daughter are also at the bedside and do not have any other particular concerns but they heard that her blood work had elevated kidney numbers.  Otherwise been at her baseline since recent discharge with gangrene.  Report her toe looks much better now.   Physical Exam   Triage Vital Signs: ED Triage Vitals  Encounter Vitals Group     BP 05/13/23 1255 120/68     Systolic BP Percentile --      Diastolic BP Percentile --      Pulse Rate 05/13/23 1255 87     Resp 05/13/23 1255 16     Temp 05/13/23 1255 97.6 F (36.4 C)     Temp Source 05/13/23 1255 Oral     SpO2 --      Weight 05/13/23 1253 167 lb 8.8 oz (76 kg)     Height --      Head Circumference --      Peak Flow --      Pain Score --      Pain Loc --      Pain Education --      Exclude from Growth Chart --     Most recent vital signs: Vitals:   05/13/23 1830 05/13/23 1900  BP: (!) 162/67 (!) 157/71  Pulse: 86 89  Resp: 14 13  Temp:    SpO2: 100% 100%    General: Awake, no distress.  Pleasantly disoriented.  Redirectable by family. CV:  Good peripheral perfusion.  Resp:  Normal effort.  Abd:  No distention.  MSK:  No deformity noted.  Neuro:  No focal deficits appreciated. Other:     ED Results / Procedures / Treatments   Labs (all  labs ordered are listed, but only abnormal results are displayed) Labs Reviewed  CBC WITH DIFFERENTIAL/PLATELET - Abnormal; Notable for the following components:      Result Value   RBC 2.84 (*)    Hemoglobin 8.9 (*)    HCT 27.2 (*)    Platelets 539 (*)    All other components within normal limits  COMPREHENSIVE METABOLIC PANEL - Abnormal; Notable for the following components:   Sodium 134 (*)    Potassium 5.6 (*)    Glucose, Bld 149 (*)    BUN 51 (*)    Creatinine, Ser 2.19 (*)    GFR, Estimated 24 (*)    All other components within normal limits  BASIC METABOLIC PANEL - Abnormal; Notable for the following components:   Glucose, Bld 144 (*)    BUN 50 (*)    Creatinine, Ser 1.89 (*)    GFR, Estimated 29 (*)    All other components within normal limits  URINALYSIS, ROUTINE W REFLEX MICROSCOPIC - Abnormal; Notable for the following components:  Color, Urine STRAW (*)    APPearance CLEAR (*)    All other components within normal limits    EKG Sinus rhythm the rate of 91 bpm.  Normal axis and intervals.  No clear signs of acute ischemia.  RADIOLOGY   Official radiology report(s): No results found.  PROCEDURES and INTERVENTIONS:  Procedures  Medications  lactated ringers bolus 1,000 mL (0 mLs Intravenous Stopped 05/13/23 1835)  sodium zirconium cyclosilicate (LOKELMA) packet 10 g (10 g Oral Given 05/13/23 1833)     IMPRESSION / MDM / ASSESSMENT AND PLAN / ED COURSE  I reviewed the triage vital signs and the nursing notes.  Differential diagnosis includes, but is not limited to, AKI, hyperkalemia, UTI, sepsis  {Patient presents with symptoms of an acute illness or injury that is potentially life-threatening.  Patient presents due to mild AKI, improving with IV fluids and Lokelma, suitable for outpatient management.  Mild hyperkalemia without EKG changes, again improving after fluids and Lokelma.  CBC at baseline.  Urine without infectious features.  Suitable for return  to facility.      FINAL CLINICAL IMPRESSION(S) / ED DIAGNOSES   Final diagnoses:  AKI (acute kidney injury) (HCC)     Rx / DC Orders   ED Discharge Orders     None        Note:  This document was prepared using Dragon voice recognition software and may include unintentional dictation errors.   Delton Prairie, MD 05/13/23 2007

## 2023-05-13 NOTE — Discharge Instructions (Signed)
After IV fluids and Lokelma her potassium has normalized and creatinine improved to 1.8.  Please have these numbers rechecked routinely in the next week by her PCP or at the facility

## 2023-05-19 ENCOUNTER — Ambulatory Visit: Payer: Medicare Other | Admitting: Podiatry

## 2023-05-19 ENCOUNTER — Encounter: Payer: Self-pay | Admitting: Podiatry

## 2023-05-19 VITALS — Ht 70.0 in | Wt 167.6 lb

## 2023-05-19 DIAGNOSIS — I96 Gangrene, not elsewhere classified: Secondary | ICD-10-CM | POA: Diagnosis not present

## 2023-05-19 DIAGNOSIS — N184 Chronic kidney disease, stage 4 (severe): Secondary | ICD-10-CM

## 2023-05-19 DIAGNOSIS — E1122 Type 2 diabetes mellitus with diabetic chronic kidney disease: Secondary | ICD-10-CM | POA: Diagnosis not present

## 2023-05-19 NOTE — Progress Notes (Signed)
Subjective:  Patient ID: Laurie Lin, female    DOB: October 12, 1955,  MRN: [id number]  Chief Complaint  Patient presents with   Foot Ulcer    Pt is here to f/u on right 1st and second toes pt was recently seen in the ED due to gangrene of the 2 toes, pt's son states the 2 toes looks a lot better than it did before.    68 y.o. female presents with the above complaint.  Patient presents for follow-up from the hospital her right first and second digit gangrene.  Patient has a history of angiogram done to the right lower extremity.  No podiatric amputations were performed.  Patient was last seen by Dr. Annamary Rummage who recommended demarcation as an outpatient.  She states that it is doing much better.  The darkness has gone away.  The angiogram helped considerably.  She is a diabetic.   Review of Systems: Negative except as noted in the HPI. Denies N/V/F/Ch.  Past Medical History:  Diagnosis Date   Alcohol abuse    Diabetes mellitus without complication (HCC)    GERD (gastroesophageal reflux disease)    Hypertension    Renal disorder    stage 3    Wernicke-Korsakoff syndrome (alcoholic) (HCC)     Current Outpatient Medications:    acetaminophen (TYLENOL) 500 MG tablet, Take 1 tablet (500 mg total) by mouth 2 (two) times daily as needed for mild pain (pain score 1-3), fever or headache., Disp: 10 tablet, Rfl: 0   ALPRAZolam (XANAX) 0.25 MG tablet, Take 0.25 mg by mouth 2 (two) times daily as needed for anxiety., Disp: , Rfl:    calcium carbonate (TUMS EX) 750 MG chewable tablet, Chew 1 tablet by mouth as needed for heartburn. As needed, Disp: , Rfl:    cholecalciferol (CHOLECALCIFEROL) 25 MCG tablet, Place 2 tablets (2,000 Units total) into feeding tube daily. (Patient taking differently: Take 2,000 Units by mouth daily.), Disp: , Rfl:    Continuous Glucose Sensor (DEXCOM G7 SENSOR) MISC, 1 Application by Does not apply route every 14 (fourteen) days., Disp: , Rfl:    cyanocobalamin 1000 MCG  tablet, Place 1 tablet (1,000 mcg total) into feeding tube daily. (Patient taking differently: Take 1,000 mcg by mouth daily.), Disp: , Rfl:    ferrous sulfate 325 (65 FE) MG EC tablet, Take 325 mg by mouth daily with breakfast., Disp: , Rfl:    Insulin Glargine (BASAGLAR KWIKPEN) 100 UNIT/ML, Inject 8 Units into the skin at bedtime. (Patient taking differently: Inject 12 Units into the skin at bedtime.), Disp: , Rfl:    insulin lispro (HUMALOG KWIKPEN) 100 UNIT/ML KwikPen, INJECT AS PER SLIDING SCALE IF 0-150=0 LESS THAN 60 NOTIFY NP/MD 151-200=2UNITS,201-250=3 UNITS,251-300=5 UNITS,301-350=7 UNITS,351-400=9 UNITS GREATER THAN 401 CALL NP/MD (Patient taking differently: Inject 0-9 Units into the skin 3 (three) times daily.), Disp: 15 mL, Rfl: 3   insulin lispro (HUMALOG) 100 UNIT/ML injection, Inject 0-9 Units into the skin 3 (three) times daily before meals. Sliding scale per Endocrinology, Disp: , Rfl:    Insulin Pen Needle 32G X 4 MM MISC, ok to sub any brand or size needle preferred by insurance/patient, use up to 5x/day, dx E10.65, Disp: , Rfl:    lisinopril (ZESTRIL) 2.5 MG tablet, Take 2.5 mg by mouth daily., Disp: , Rfl:    Multiple Vitamin (MULTIVITAMIN WITH MINERALS) TABS tablet, Place 1 tablet into feeding tube daily. (Patient taking differently: Take 1 tablet by mouth daily.), Disp: , Rfl:    Netarsudil  Dimesylate (RHOPRESSA) 0.02 % SOLN, Place 1 drop into both eyes every evening., Disp: , Rfl:    omeprazole (PRILOSEC) 20 MG capsule, Take 20 mg by mouth daily., Disp: , Rfl:    ONETOUCH DELICA LANCETS 33G MISC, 1 Device by Does not apply route 4 (four) times daily., Disp: 100 each, Rfl: 11   polyethylene glycol (MIRALAX / GLYCOLAX) 17 g packet, Take 17 g by mouth daily., Disp: , Rfl:    risperiDONE (RISPERDAL) 1 MG tablet, Take 1 mg by mouth 2 (two) times daily., Disp: , Rfl:    sertraline (ZOLOFT) 25 MG tablet, TAKE 1 TABLET (25 MG TOTAL) BY MOUTH DAILY., Disp: 90 tablet, Rfl: 1    sertraline (ZOLOFT) 50 MG tablet, Take 50 mg by mouth daily., Disp: , Rfl:    thiamine (VITAMIN B1) 100 MG tablet, Place 1 tablet (100 mg total) into feeding tube daily. (Patient taking differently: Take 100 mg by mouth daily.), Disp: , Rfl:    torsemide (DEMADEX) 20 MG tablet, Take 20 mg by mouth daily. As needed, Disp: , Rfl:    traMADol (ULTRAM) 50 MG tablet, Take 50 mg by mouth 2 (two) times daily., Disp: , Rfl:    Travoprost, BAK Free, (TRAVATAN) 0.004 % SOLN ophthalmic solution, Place 1 drop into both eyes at bedtime., Disp: , Rfl:    traZODone (DESYREL) 50 MG tablet, Take 25 mg by mouth at bedtime., Disp: , Rfl:    vitamin E 180 MG (400 UNITS) capsule, Take 400 Units by mouth daily., Disp: , Rfl:   Social History   Tobacco Use  Smoking Status Never  Smokeless Tobacco Never    Allergies  Allergen Reactions   Ibuprofen Nausea Only   Nsaids     Ulcerative stomach and small intestines   Brimonidine Itching    Red, itchy, sensitivity to light Red, itchy, sensitivity to light    Latex Itching    Other reaction(s): Other (see comments)   Objective:  There were no vitals filed for this visit. Body mass index is 24.04 kg/m. Constitutional Well developed. Well nourished.  Vascular Dorsalis pedis pulses palpable bilaterally. Posterior tibial pulses palpable bilaterally. Capillary refill normal to all digits.  No cyanosis or clubbing noted. Pedal hair growth normal.  Neurologic Normal speech. Oriented to person, place, and time. Epicritic sensation to light touch grossly present bilaterally.  Dermatologic No further gangrene noted to right second and first digit.  Small superficial ulceration noted does not probe down to bone no clinical signs of infection noted.  At this time no clinical concern for gangrene  Orthopedic: Normal joint ROM without pain or crepitus bilaterally. No visible deformities. No bony tenderness.   Radiographs: None Assessment:   1. Gangrene of toe  of right foot (HCC)   2. Diabetes mellitus with stage 4 chronic kidney disease GFR 15-29 (HCC)    Plan:  Patient was evaluated and treated and all questions answered.  Right hallux and second digit gangrene resolving -All questions and concerns were discussed with the patient in extensive detail at this time no concern for gangrene or amputation.  I discussed with the patient the importance of managing glucose as she is a diabetic.  Very superficial ulceration noted which are stable in nature.  They will continue local wound care at the nursing facility I am hopeful in 3 weeks to be completely reepithelialized and no further ulceration noted.  No follow-ups on file.

## 2023-05-26 ENCOUNTER — Telehealth: Payer: Self-pay

## 2023-05-26 NOTE — Telephone Encounter (Signed)
 Copied from CRM [id number]. Topic: Clinical - Home Health Verbal Orders >> May 26, 2023  4:28 PM Tanazia G wrote: Caller/Agency: Zapharia from [facility] Callback Number: [phone] Service Requested: Occupational Therapy Frequency: One time a week for 4 weeks. Any new concerns about the patient? No

## 2023-05-26 NOTE — Telephone Encounter (Signed)
It is okay to given verbal order for OT.

## 2023-05-26 NOTE — Telephone Encounter (Signed)
Is it okay to call and give verbal orders?

## 2023-05-27 NOTE — Telephone Encounter (Signed)
 Left message for Laurie Lin from [facility] to call back regarding home health orders.

## 2023-05-28 NOTE — Telephone Encounter (Signed)
 Left another message for Zapharia from [facility] to call back regarding home health orders.

## 2023-05-29 NOTE — Telephone Encounter (Signed)
 Noted! Thank you

## 2023-05-29 NOTE — Telephone Encounter (Signed)
 Spoke with Laurie Lin from [facility] regarding this Patient. Laurie Lin states if they need anything they will call us  back.

## 2023-06-09 ENCOUNTER — Ambulatory Visit: Payer: Medicare Other | Admitting: Podiatry

## 2023-08-13 ENCOUNTER — Ambulatory Visit: Payer: Medicare Other | Admitting: Nurse Practitioner

## 2023-08-19 ENCOUNTER — Encounter: Payer: Self-pay | Admitting: Nurse Practitioner

## 2023-08-19 NOTE — Telephone Encounter (Signed)
 Fyi pt chart needs to be updated to state that pt is deceased

## 2023-08-20 DEATH — deceased
